# Patient Record
Sex: Female | Born: 1935 | Race: White | Hispanic: No | Smoking: Former smoker
Health system: Southern US, Community
[De-identification: ages and names within clinical notes are randomized; demographics above are authoritative.]

## PROBLEM LIST (undated history)

## (undated) DIAGNOSIS — R011 Cardiac murmur, unspecified: Secondary | ICD-10-CM

## (undated) DIAGNOSIS — K921 Melena: Secondary | ICD-10-CM

## (undated) DIAGNOSIS — N189 Chronic kidney disease, unspecified: Secondary | ICD-10-CM

## (undated) DIAGNOSIS — I00 Rheumatic fever without heart involvement: Secondary | ICD-10-CM

## (undated) DIAGNOSIS — I10 Essential (primary) hypertension: Secondary | ICD-10-CM

## (undated) DIAGNOSIS — Z8619 Personal history of other infectious and parasitic diseases: Secondary | ICD-10-CM

## (undated) DIAGNOSIS — R58 Hemorrhage, not elsewhere classified: Secondary | ICD-10-CM

## (undated) DIAGNOSIS — I639 Cerebral infarction, unspecified: Secondary | ICD-10-CM

## (undated) DIAGNOSIS — R569 Unspecified convulsions: Secondary | ICD-10-CM

## (undated) DIAGNOSIS — Z87440 Personal history of urinary (tract) infections: Secondary | ICD-10-CM

## (undated) DIAGNOSIS — Z8719 Personal history of other diseases of the digestive system: Secondary | ICD-10-CM

## (undated) DIAGNOSIS — G459 Transient cerebral ischemic attack, unspecified: Secondary | ICD-10-CM

## (undated) DIAGNOSIS — F329 Major depressive disorder, single episode, unspecified: Secondary | ICD-10-CM

## (undated) DIAGNOSIS — E785 Hyperlipidemia, unspecified: Secondary | ICD-10-CM

## (undated) DIAGNOSIS — Z8711 Personal history of peptic ulcer disease: Secondary | ICD-10-CM

## (undated) DIAGNOSIS — F32A Depression, unspecified: Secondary | ICD-10-CM

## (undated) DIAGNOSIS — R413 Other amnesia: Secondary | ICD-10-CM

## (undated) DIAGNOSIS — R32 Unspecified urinary incontinence: Secondary | ICD-10-CM

## (undated) DIAGNOSIS — H409 Unspecified glaucoma: Secondary | ICD-10-CM

## (undated) DIAGNOSIS — T7840XA Allergy, unspecified, initial encounter: Secondary | ICD-10-CM

## (undated) DIAGNOSIS — M1990 Unspecified osteoarthritis, unspecified site: Secondary | ICD-10-CM

## (undated) HISTORY — DX: Major depressive disorder, single episode, unspecified: F32.9

## (undated) HISTORY — PX: ABDOMINAL HYSTERECTOMY: SHX81

## (undated) HISTORY — DX: Unspecified urinary incontinence: R32

## (undated) HISTORY — DX: Hyperlipidemia, unspecified: E78.5

## (undated) HISTORY — DX: Melena: K92.1

## (undated) HISTORY — DX: Cerebral infarction, unspecified: I63.9

## (undated) HISTORY — DX: Hemorrhage, not elsewhere classified: R58

## (undated) HISTORY — PX: TONSILLECTOMY: SUR1361

## (undated) HISTORY — DX: Personal history of other diseases of the digestive system: Z87.19

## (undated) HISTORY — DX: Allergy, unspecified, initial encounter: T78.40XA

## (undated) HISTORY — DX: Unspecified convulsions: R56.9

## (undated) HISTORY — PX: OTHER SURGICAL HISTORY: SHX169

## (undated) HISTORY — DX: Personal history of peptic ulcer disease: Z87.11

## (undated) HISTORY — DX: Personal history of other infectious and parasitic diseases: Z86.19

## (undated) HISTORY — DX: Personal history of urinary (tract) infections: Z87.440

## (undated) HISTORY — DX: Rheumatic fever without heart involvement: I00

## (undated) HISTORY — DX: Unspecified osteoarthritis, unspecified site: M19.90

## (undated) HISTORY — DX: Other amnesia: R41.3

## (undated) HISTORY — DX: Chronic kidney disease, unspecified: N18.9

## (undated) HISTORY — DX: Essential (primary) hypertension: I10

## (undated) HISTORY — DX: Unspecified glaucoma: H40.9

## (undated) HISTORY — DX: Depression, unspecified: F32.A

## (undated) HISTORY — DX: Cardiac murmur, unspecified: R01.1

## (undated) HISTORY — DX: Transient cerebral ischemic attack, unspecified: G45.9

---

## 1981-12-28 HISTORY — PX: APPENDECTOMY: SHX54

## 2004-04-11 ENCOUNTER — Encounter: Admission: RE | Admit: 2004-04-11 | Discharge: 2004-04-11 | Payer: Self-pay | Admitting: Neurosurgery

## 2004-04-25 ENCOUNTER — Encounter: Admission: RE | Admit: 2004-04-25 | Discharge: 2004-04-25 | Payer: Self-pay | Admitting: Neurosurgery

## 2004-05-14 ENCOUNTER — Encounter: Admission: RE | Admit: 2004-05-14 | Discharge: 2004-05-14 | Payer: Self-pay | Admitting: Neurosurgery

## 2004-07-09 ENCOUNTER — Encounter: Admission: RE | Admit: 2004-07-09 | Discharge: 2004-07-09 | Payer: Self-pay | Admitting: Neurosurgery

## 2004-10-20 ENCOUNTER — Encounter: Admission: RE | Admit: 2004-10-20 | Discharge: 2004-10-20 | Payer: Self-pay | Admitting: Neurosurgery

## 2004-11-03 ENCOUNTER — Encounter: Admission: RE | Admit: 2004-11-03 | Discharge: 2004-11-03 | Payer: Self-pay | Admitting: Neurosurgery

## 2005-12-20 ENCOUNTER — Emergency Department (HOSPITAL_COMMUNITY): Admission: EM | Admit: 2005-12-20 | Discharge: 2005-12-20 | Payer: Self-pay | Admitting: Emergency Medicine

## 2007-07-12 ENCOUNTER — Encounter: Admission: RE | Admit: 2007-07-12 | Discharge: 2007-07-12 | Payer: Self-pay | Admitting: Family Medicine

## 2007-10-18 ENCOUNTER — Encounter: Admission: RE | Admit: 2007-10-18 | Discharge: 2007-10-18 | Payer: Self-pay | Admitting: Family Medicine

## 2008-07-16 ENCOUNTER — Encounter: Admission: RE | Admit: 2008-07-16 | Discharge: 2008-07-16 | Payer: Self-pay | Admitting: Family Medicine

## 2009-07-17 ENCOUNTER — Encounter: Admission: RE | Admit: 2009-07-17 | Discharge: 2009-07-17 | Payer: Self-pay | Admitting: Family Medicine

## 2010-07-22 ENCOUNTER — Encounter
Admission: RE | Admit: 2010-07-22 | Discharge: 2010-07-22 | Payer: Self-pay | Source: Home / Self Care | Admitting: Family Medicine

## 2010-12-26 ENCOUNTER — Encounter
Admission: RE | Admit: 2010-12-26 | Discharge: 2010-12-26 | Payer: Self-pay | Source: Home / Self Care | Attending: Family Medicine | Admitting: Family Medicine

## 2011-04-26 ENCOUNTER — Emergency Department (HOSPITAL_COMMUNITY): Payer: Medicare Other

## 2011-04-26 ENCOUNTER — Emergency Department (HOSPITAL_COMMUNITY)
Admission: EM | Admit: 2011-04-26 | Discharge: 2011-04-26 | Disposition: A | Discharge status: Home / Self Care | Payer: Medicare Other | Attending: Emergency Medicine | Admitting: Emergency Medicine

## 2011-04-26 DIAGNOSIS — I10 Essential (primary) hypertension: Secondary | ICD-10-CM | POA: Insufficient documentation

## 2011-04-26 DIAGNOSIS — R209 Unspecified disturbances of skin sensation: Secondary | ICD-10-CM | POA: Insufficient documentation

## 2011-04-26 DIAGNOSIS — K219 Gastro-esophageal reflux disease without esophagitis: Secondary | ICD-10-CM | POA: Insufficient documentation

## 2011-04-26 DIAGNOSIS — E7800 Pure hypercholesterolemia, unspecified: Secondary | ICD-10-CM | POA: Insufficient documentation

## 2011-04-26 DIAGNOSIS — H53469 Homonymous bilateral field defects, unspecified side: Secondary | ICD-10-CM | POA: Insufficient documentation

## 2011-04-26 DIAGNOSIS — R29898 Other symptoms and signs involving the musculoskeletal system: Secondary | ICD-10-CM | POA: Insufficient documentation

## 2011-04-26 DIAGNOSIS — Z8679 Personal history of other diseases of the circulatory system: Secondary | ICD-10-CM | POA: Insufficient documentation

## 2011-04-26 DIAGNOSIS — H5460 Unqualified visual loss, one eye, unspecified: Secondary | ICD-10-CM | POA: Insufficient documentation

## 2011-04-26 DIAGNOSIS — H409 Unspecified glaucoma: Secondary | ICD-10-CM | POA: Insufficient documentation

## 2011-04-26 LAB — POCT I-STAT, CHEM 8
BUN: 20 mg/dL (ref 6–23)
Calcium, Ion: 1.15 mmol/L (ref 1.12–1.32)
Chloride: 104 mEq/L (ref 96–112)
Creatinine, Ser: 1.3 mg/dL — ABNORMAL HIGH (ref 0.4–1.2)
Glucose, Bld: 89 mg/dL (ref 70–99)
HCT: 39 % (ref 36.0–46.0)
Hemoglobin: 13.3 g/dL (ref 12.0–15.0)
Potassium: 3.6 mEq/L (ref 3.5–5.1)
Sodium: 140 mEq/L (ref 135–145)
TCO2: 26 mmol/L (ref 0–100)

## 2011-05-08 ENCOUNTER — Other Ambulatory Visit: Payer: Self-pay | Admitting: Neurology

## 2011-05-08 DIAGNOSIS — I63239 Cerebral infarction due to unspecified occlusion or stenosis of unspecified carotid arteries: Secondary | ICD-10-CM

## 2011-07-16 ENCOUNTER — Other Ambulatory Visit: Payer: Self-pay | Admitting: Family Medicine

## 2011-07-16 DIAGNOSIS — Z1231 Encounter for screening mammogram for malignant neoplasm of breast: Secondary | ICD-10-CM

## 2011-08-04 ENCOUNTER — Ambulatory Visit
Admission: RE | Admit: 2011-08-04 | Discharge: 2011-08-04 | Disposition: A | Discharge status: Home / Self Care | Payer: Medicare Other | Source: Ambulatory Visit | Attending: Family Medicine | Admitting: Family Medicine

## 2011-08-04 DIAGNOSIS — Z1231 Encounter for screening mammogram for malignant neoplasm of breast: Secondary | ICD-10-CM

## 2011-11-03 ENCOUNTER — Encounter: Payer: Self-pay | Admitting: Vascular Surgery

## 2011-11-04 ENCOUNTER — Encounter: Payer: Self-pay | Admitting: Vascular Surgery

## 2011-11-05 ENCOUNTER — Encounter: Payer: Self-pay | Admitting: Vascular Surgery

## 2011-11-05 ENCOUNTER — Ambulatory Visit (INDEPENDENT_AMBULATORY_CARE_PROVIDER_SITE_OTHER): Payer: Medicare Other | Admitting: Vascular Surgery

## 2011-11-05 VITALS — BP 145/81 | HR 94 | Resp 18 | Ht 63.0 in | Wt 175.0 lb

## 2011-11-05 DIAGNOSIS — I863 Vulval varices: Secondary | ICD-10-CM

## 2011-11-05 NOTE — Progress Notes (Signed)
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 This does happen on 3 different occasions. Initially. She presented to her primary care physician where she had a topical thrombotic agent applied. He does not have any history of DVT or varicose veins. She did have treatment for spider veins telangiectasia on her legs several years ago at an outlying vein center. She denies any lower extremity swelling.  Past Medical History  Diagnosis Date  . Bleeding of blood vessel     ext. genitalia area  . Stroke   . Hypertension   . Hyperlipidemia     History  Substance Use Topics  . Smoking status: Former Smoker -- 40 years    Types: Cigarettes    Quit date: 11/05/1971  . Smokeless tobacco: Never Used  . Alcohol Use: No    Family History  Problem Relation Age of Onset  . Cancer Mother   . Heart disease Father   . Cancer Sister     Allergies  Allergen Reactions  . Tetanus Toxoids     Current outpatient prescriptions:acetaminophen (TYLENOL) 325 MG tablet, Take 650 mg by mouth every 6 (six) hours as needed.  , Disp: , Rfl: ;  clonazePAM (KLONOPIN) 0.5 MG tablet, Take 0.5 mg by mouth 2 (two) times daily as needed.  , Disp: , Rfl: ;  clopidogrel (PLAVIX) 75 MG tablet, Take 75 mg by mouth daily.  , Disp: , Rfl: ;  enalapril (VASOTEC) 20 MG tablet, Take 20 mg by mouth daily.  , Disp: , Rfl:  famciclovir (FAMVIR) 500 MG tablet, Take 500 mg by mouth 3 (three) times daily.  , Disp: , Rfl: ;  famotidine (PEPCID) 20 MG tablet, Take 20 mg by mouth 2 (two) times daily.  , Disp: , Rfl: ;  furosemide (LASIX) 80 MG tablet, Take 80 mg by mouth daily.  , Disp: , Rfl: ;  pravastatin (PRAVACHOL) 40 MG tablet, Take 40 mg by mouth daily.  , Disp: , Rfl:  traMADol (ULTRAM) 50 MG tablet, Take 50 mg by mouth 2 (two) times daily. Maximum dose= 8 tablets per day , Disp: , Rfl: ;  Vitamin D, Ergocalciferol, (DRISDOL) 50000 UNITS CAPS, Take 50,000 Units by  mouth.  , Disp: , Rfl: ;  LORazepam (ATIVAN) 0.5 MG tablet, Take 0.5 mg by mouth every 8 (eight) hours.  , Disp: , Rfl:   BP 145/81  Pulse 94  Resp 18  Ht 5\' 3"  (1.6 m)  Wt 175 lb (79.379 kg)  BMI 31.00 kg/m2  Body mass index is 31.00 kg/(m^2).       Review of systems positive for stroke in the past otherwise negative except for past medical history.  Physical exam: Well-developed well-nourished white female no acute distress. HEENT normal. 2+ radial and dorsalis pedis pulses bilaterally. She does not have any lower extremity swelling. She has a few scattered telangiectasia over both lower extremity spelled no evidence of venous hypertension. Extremities without deformities or cyanosis. Neurologically she is grossly intact. Regarding her labia she does have all trouble diffuse small telangiectasia. She does not have any evidence of labial varicosities.  Impression and plan. I discussed the significance of this with Ms. Jonna Clark. I do not see any evidence of pelvic engorgement or other causes for this. I explained that I have not seen this before but should be able to stop bleeding from the telangiectasia with sclerotherapy. She has multiple areas in That she may develop more  of these in the future and would that point be at risk for future bleeding as well. He will consider treatment with sclerotherapy notify us if she wished to proceed she understands and began our office as an outpatient.

## 2012-05-09 ENCOUNTER — Other Ambulatory Visit: Payer: Self-pay | Admitting: Family Medicine

## 2012-05-09 DIAGNOSIS — M545 Low back pain, unspecified: Secondary | ICD-10-CM

## 2012-05-10 ENCOUNTER — Other Ambulatory Visit: Payer: Medicare Other

## 2012-07-05 ENCOUNTER — Other Ambulatory Visit: Payer: Self-pay | Admitting: Family Medicine

## 2012-07-05 DIAGNOSIS — Z1231 Encounter for screening mammogram for malignant neoplasm of breast: Secondary | ICD-10-CM

## 2012-08-08 ENCOUNTER — Ambulatory Visit
Admission: RE | Admit: 2012-08-08 | Discharge: 2012-08-08 | Disposition: A | Discharge status: Home / Self Care | Payer: Medicare Other | Source: Ambulatory Visit | Attending: Family Medicine | Admitting: Family Medicine

## 2012-08-08 DIAGNOSIS — Z1231 Encounter for screening mammogram for malignant neoplasm of breast: Secondary | ICD-10-CM

## 2013-07-31 ENCOUNTER — Other Ambulatory Visit: Payer: Self-pay

## 2013-07-31 DIAGNOSIS — Z1231 Encounter for screening mammogram for malignant neoplasm of breast: Secondary | ICD-10-CM

## 2013-08-16 ENCOUNTER — Ambulatory Visit: Payer: Medicare Other

## 2013-09-06 ENCOUNTER — Ambulatory Visit
Admission: RE | Admit: 2013-09-06 | Discharge: 2013-09-06 | Disposition: A | Discharge status: Home / Self Care | Payer: Medicare Other | Source: Ambulatory Visit

## 2013-09-06 DIAGNOSIS — Z1231 Encounter for screening mammogram for malignant neoplasm of breast: Secondary | ICD-10-CM

## 2013-10-22 ENCOUNTER — Observation Stay (HOSPITAL_COMMUNITY)
Admission: EM | Admit: 2013-10-22 | Discharge: 2013-10-23 | Disposition: A | Discharge status: Home / Self Care | Payer: Medicare Other | Attending: Internal Medicine | Admitting: Internal Medicine

## 2013-10-22 ENCOUNTER — Encounter (HOSPITAL_COMMUNITY): Payer: Self-pay | Admitting: Emergency Medicine

## 2013-10-22 ENCOUNTER — Emergency Department (HOSPITAL_COMMUNITY): Payer: Medicare Other

## 2013-10-22 ENCOUNTER — Observation Stay (HOSPITAL_COMMUNITY): Payer: Medicare Other

## 2013-10-22 DIAGNOSIS — Z888 Allergy status to other drugs, medicaments and biological substances status: Secondary | ICD-10-CM | POA: Diagnosis not present

## 2013-10-22 DIAGNOSIS — R58 Hemorrhage, not elsewhere classified: Secondary | ICD-10-CM | POA: Diagnosis not present

## 2013-10-22 DIAGNOSIS — G459 Transient cerebral ischemic attack, unspecified: Secondary | ICD-10-CM | POA: Diagnosis not present

## 2013-10-22 DIAGNOSIS — E785 Hyperlipidemia, unspecified: Secondary | ICD-10-CM | POA: Diagnosis not present

## 2013-10-22 DIAGNOSIS — Z79899 Other long term (current) drug therapy: Secondary | ICD-10-CM | POA: Diagnosis not present

## 2013-10-22 DIAGNOSIS — I10 Essential (primary) hypertension: Secondary | ICD-10-CM | POA: Diagnosis present

## 2013-10-22 DIAGNOSIS — Z87891 Personal history of nicotine dependence: Secondary | ICD-10-CM | POA: Diagnosis not present

## 2013-10-22 DIAGNOSIS — R4182 Altered mental status, unspecified: Secondary | ICD-10-CM | POA: Diagnosis present

## 2013-10-22 HISTORY — DX: Transient cerebral ischemic attack, unspecified: G45.9

## 2013-10-22 LAB — URINALYSIS, ROUTINE W REFLEX MICROSCOPIC
Bilirubin Urine: NEGATIVE
Glucose, UA: NEGATIVE mg/dL
Hgb urine dipstick: NEGATIVE
Protein, ur: NEGATIVE mg/dL
Specific Gravity, Urine: 1.016 (ref 1.005–1.030)
Urobilinogen, UA: 0.2 mg/dL (ref 0.0–1.0)
pH: 7 (ref 5.0–8.0)

## 2013-10-22 LAB — DIFFERENTIAL
Basophils Absolute: 0 10*3/uL (ref 0.0–0.1)
Basophils Relative: 0 % (ref 0–1)
Eosinophils Absolute: 0.1 10*3/uL (ref 0.0–0.7)
Eosinophils Relative: 2 % (ref 0–5)
Monocytes Relative: 11 % (ref 3–12)
Neutrophils Relative %: 54 % (ref 43–77)

## 2013-10-22 LAB — COMPREHENSIVE METABOLIC PANEL
ALT: 18 U/L (ref 0–35)
AST: 24 U/L (ref 0–37)
Albumin: 4.3 g/dL (ref 3.5–5.2)
Alkaline Phosphatase: 91 U/L (ref 39–117)
BUN: 12 mg/dL (ref 6–23)
Creatinine, Ser: 0.99 mg/dL (ref 0.50–1.10)
Glucose, Bld: 98 mg/dL (ref 70–99)
Potassium: 3.2 mEq/L — ABNORMAL LOW (ref 3.5–5.1)
Sodium: 142 mEq/L (ref 135–145)
Total Protein: 7.3 g/dL (ref 6.0–8.3)

## 2013-10-22 LAB — URINE MICROSCOPIC-ADD ON

## 2013-10-22 LAB — PROTIME-INR
INR: 1 (ref 0.00–1.49)
Prothrombin Time: 13 seconds (ref 11.6–15.2)

## 2013-10-22 LAB — CBC
Hemoglobin: 14.7 g/dL (ref 12.0–15.0)
MCH: 31.7 pg (ref 26.0–34.0)
MCHC: 35 g/dL (ref 30.0–36.0)
MCV: 90.5 fL (ref 78.0–100.0)
Platelets: 163 10*3/uL (ref 150–400)

## 2013-10-22 LAB — TROPONIN I: Troponin I: <0.3 ng/mL (ref <0.30)

## 2013-10-22 LAB — APTT: aPTT: 32 seconds (ref 24–37)

## 2013-10-22 MED ORDER — CLOPIDOGREL BISULFATE 75 MG PO TABS
37.5000 mg | ORAL_TABLET | ORAL | Status: DC
  Administered 2013-10-23: 37.5 mg via ORAL
  Filled 2013-10-22: qty 1

## 2013-10-22 MED ORDER — PSYLLIUM 95 % PO PACK
1.0000 | PACK | Freq: Every day | ORAL | Status: DC
  Administered 2013-10-22: 22:00:00 via ORAL
  Filled 2013-10-22 (×2): qty 1

## 2013-10-22 MED ORDER — FAMOTIDINE 20 MG PO TABS
20.0000 mg | ORAL_TABLET | Freq: Two times a day (BID) | ORAL | Status: DC
  Administered 2013-10-22 – 2013-10-23 (×2): 20 mg via ORAL
  Filled 2013-10-22 (×3): qty 1

## 2013-10-22 MED ORDER — SIMVASTATIN 20 MG PO TABS
20.0000 mg | ORAL_TABLET | Freq: Every day | ORAL | Status: DC
  Administered 2013-10-22: 20 mg via ORAL
  Filled 2013-10-22 (×2): qty 1

## 2013-10-22 MED ORDER — SODIUM CHLORIDE 0.9 % IV SOLN
INTRAVENOUS | Status: DC
  Administered 2013-10-22 – 2013-10-23 (×2): via INTRAVENOUS

## 2013-10-22 MED ORDER — ENOXAPARIN SODIUM 40 MG/0.4ML ~~LOC~~ SOLN
40.0000 mg | SUBCUTANEOUS | Status: DC
  Administered 2013-10-22: 40 mg via SUBCUTANEOUS
  Filled 2013-10-22 (×2): qty 0.4

## 2013-10-22 MED ORDER — PREDNISOLONE ACETATE 1 % OP SUSP
1.0000 [drp] | Freq: Two times a day (BID) | OPHTHALMIC | Status: DC
  Administered 2013-10-22 – 2013-10-23 (×2): 1 [drp] via OPHTHALMIC
  Filled 2013-10-22: qty 1

## 2013-10-22 MED ORDER — CLONAZEPAM 0.5 MG PO TABS: 0.2500 mg | ORAL_TABLET | Freq: Every day | ORAL | Status: DC | PRN

## 2013-10-22 MED ORDER — DORZOLAMIDE HCL-TIMOLOL MAL 2-0.5 % OP SOLN
1.0000 [drp] | Freq: Every day | OPHTHALMIC | Status: DC
  Administered 2013-10-23: 1 [drp] via OPHTHALMIC
  Filled 2013-10-22: qty 10

## 2013-10-22 MED ORDER — TRAMADOL HCL 50 MG PO TABS
50.0000 mg | ORAL_TABLET | Freq: Two times a day (BID) | ORAL | Status: DC | PRN
  Administered 2013-10-23: 50 mg via ORAL
  Filled 2013-10-22: qty 1

## 2013-10-22 MED ORDER — POTASSIUM CHLORIDE CRYS ER 20 MEQ PO TBCR
40.0000 meq | EXTENDED_RELEASE_TABLET | Freq: Once | ORAL | Status: AC
  Administered 2013-10-22: 40 meq via ORAL
  Filled 2013-10-22: qty 2

## 2013-10-22 MED ORDER — AMLODIPINE BESYLATE 5 MG PO TABS
5.0000 mg | ORAL_TABLET | Freq: Every day | ORAL | Status: DC
  Administered 2013-10-22 – 2013-10-23 (×2): 5 mg via ORAL
  Filled 2013-10-22 (×2): qty 1

## 2013-10-22 MED ORDER — ACETAMINOPHEN 325 MG PO TABS: 650.0000 mg | ORAL_TABLET | ORAL | Status: DC | PRN

## 2013-10-22 MED ORDER — POLYVINYL ALCOHOL 1.4 % OP SOLN
1.0000 [drp] | OPHTHALMIC | Status: DC | PRN
  Administered 2013-10-22: 1 [drp] via OPHTHALMIC
  Filled 2013-10-22: qty 15

## 2013-10-22 NOTE — ED Notes (Signed)
Pt still in MRI at this time

## 2013-10-22 NOTE — ED Notes (Signed)
Pt returned from MRI °

## 2013-10-22 NOTE — ED Notes (Signed)
Pt undressed, in gown, on monitor, continuous pulse oximetry and blood pressure cuff; vitals and EKG being performed; family at bedside

## 2013-10-22 NOTE — ED Notes (Signed)
Neurology at bedside.

## 2013-10-22 NOTE — ED Notes (Signed)
Called Floor for report stated will call back.

## 2013-10-22 NOTE — H&P (Signed)
History and Physical       Hospital Admission Note Date: 10/22/2013  Patient name: Cheryl Wood Medical record number: [id number] Date of birth: 04-Jan-1936 Age: 77 y.o. Gender: female PCP: Kaleen Mask, MD    Chief Complaint:  Sudden altered mental status  HPI: Patient is a 77 year old with a history of stroke in 2006, hypertension, hyperlipidemia presented with confusion and sudden altered mental status today. Patient reports that she had a history of stroke in the past and has been seen by Dr. Pearlean Brownie, she takes Plavix 37.5 mg every other day as before dose gives her headache.  The patient was in the church today, sang in the choir and around 11:20am was noticed to be confused. Patient did not know where she was, patient was brought to the ER by the EMS. Per the family member (daughter) in the room, she did not have any seizure-like activity or a syncopal episode. CT scan was done which showed no acute stroke.   Patient's daughter reported that her episode was similar to the TIA she had experienced 2 years ago. In the ER patient's BP was also elevated at 183/108 (at the time of triage 212/96), labs were unremarkable except potassium of 3.2.  Neurology was consulted and recommended TIA workup and rule out seizure.  Review of Systems:  Constitutional: Denies fever, chills, diaphoresis, poor appetite and fatigue.  HEENT: Denies photophobia, eye pain, redness, hearing loss, ear pain, congestion, sore throat, rhinorrhea, sneezing, mouth sores, trouble swallowing, neck pain, neck stiffness and tinnitus.   Respiratory: Denies SOB, DOE, cough, chest tightness,  and wheezing.   Cardiovascular: Denies chest pain, palpitations and leg swelling.  Gastrointestinal: Denies nausea, vomiting, abdominal pain, diarrhea, constipation, blood in stool and abdominal distention.  Genitourinary: Denies dysuria, urgency, frequency, hematuria, flank pain  and difficulty urinating.  Musculoskeletal: Denies myalgias, back pain, joint swelling, arthralgias and gait problem.  Skin: Denies pallor, rash and wound.  Neurological: Please see history of present illness Hematological: Denies adenopathy. Easy bruising, personal or family bleeding history  Psychiatric/Behavioral: Denies suicidal ideation, mood changes, confusion, nervousness, sleep disturbance and agitation  Past Medical History: Past Medical History  Diagnosis Date  . Bleeding of blood vessel     ext. genitalia area  . Stroke   . Hypertension   . Hyperlipidemia    Past Surgical History  Procedure Laterality Date  . Abdominal hysterectomy    . Gum transplant      Medications: Prior to Admission medications   Medication Sig Start Date End Date Taking? Authorizing Provider  clonazePAM (KLONOPIN) 0.5 MG tablet Take 0.25 mg by mouth daily as needed (sleep).    Yes Historical Provider, MD  clopidogrel (PLAVIX) 75 MG tablet Take 37.5 mg by mouth every other day.    Yes Historical Provider, MD  Dorzolamide HCl-Timolol Mal (COSOPT OP) Place 1 drop into the left eye daily. Cosopt-Plus 0.5%   Yes Historical Provider, MD  famciclovir (FAMVIR) 500 MG tablet Take 500 mg by mouth 3 (three) times daily.     Yes Historical Provider, MD  famotidine (PEPCID) 20 MG tablet Take 20 mg by mouth 2 (two) times daily.    Yes Historical Provider, MD  furosemide (LASIX) 80 MG tablet Take 80 mg by mouth daily.     Yes Historical Provider, MD  Polyethyl Glycol-Propyl Glycol (SYSTANE OP) Place 1 drop into the right eye 2 (two) times daily.   Yes Historical Provider, MD  pravastatin (PRAVACHOL) 40 MG tablet Take 40 mg by  mouth every evening.    Yes Historical Provider, MD  prednisoLONE acetate (PRED FORTE) 1 % ophthalmic suspension Place 1 drop into the left eye 2 (two) times daily.   Yes Historical Provider, MD  psyllium (HYDROCIL/METAMUCIL) 95 % PACK Take 1 packet by mouth at bedtime.   Yes Historical  Provider, MD  traMADol (ULTRAM) 50 MG tablet Take 50 mg by mouth 2 (two) times daily as needed for pain. Maximum dose= 8 tablets per day   Yes Historical Provider, MD  Vitamin D, Ergocalciferol, (DRISDOL) 50000 UNITS CAPS Take 50,000 Units by mouth every 14 (fourteen) days.    Yes Historical Provider, MD    Allergies:   Allergies  Allergen Reactions  . Tetanus Toxoids     Social History:  reports that she quit smoking about 41 years ago. Her smoking use included Cigarettes. She smoked 0.00 packs per day for 40 years. She has never used smokeless tobacco. She reports that she does not drink alcohol or use illicit drugs.  Family History: Family History  Problem Relation Age of Onset  . Cancer Mother   . Heart disease Father   . Cancer Sister     Physical Exam: Blood pressure 138/75, pulse 63, temperature 98 F (36.7 C), temperature source Oral, resp. rate 14, SpO2 96.00%. General: Alert, awake, oriented x3, in no acute distress. HEENT: normocephalic, atraumatic, anicteric sclera, pink conjunctiva, pupils equal and reactive to light and accomodation, oropharynx clear Neck: supple, no masses or lymphadenopathy, no goiter, no bruits  Heart: Regular rate and rhythm, without murmurs, rubs or gallops. Lungs: Clear to auscultation bilaterally, no wheezing, rales or rhonchi. Abdomen: Soft, nontender, nondistended, positive bowel sounds, no masses. Extremities: No clubbing, cyanosis or edema with positive pedal pulses. Neuro: Grossly intact, no focal neurological deficits, strength 5/5 upper and lower extremities bilaterally Psych: alert and oriented x 3, normal mood and affect Skin: no rashes or lesions, warm and dry   LABS on Admission:  Basic Metabolic Panel:  Recent Labs Lab 10/22/13 1300  NA 142  K 3.2*  CL 103  CO2 28  GLUCOSE 98  BUN 12  CREATININE 0.99  CALCIUM 9.4   Liver Function Tests:  Recent Labs Lab 10/22/13 1300  AST 24  ALT 18  ALKPHOS 91  BILITOT 0.9   PROT 7.3  ALBUMIN 4.3   No results found for this basename: LIPASE, AMYLASE,  in the last 168 hours No results found for this basename: AMMONIA,  in the last 168 hours CBC:  Recent Labs Lab 10/22/13 1300  WBC 5.0  NEUTROABS 2.7  HGB 14.7  HCT 42.0  MCV 90.5  PLT 163   Cardiac Enzymes:  Recent Labs Lab 10/22/13 1300  TROPONINI <0.30   BNP: No components found with this basename: POCBNP,  CBG: No results found for this basename: GLUCAP,  in the last 168 hours   Radiological Exams on Admission: Ct Head Wo Contrast  10/22/2013   CLINICAL DATA:  Altered mental status/ short-term memory loss  EXAM: CT HEAD WITHOUT CONTRAST  TECHNIQUE: Contiguous axial images were obtained from the base of the skull through the vertex without intravenous contrast. Study was obtained within 24 hr of patient's arrival at the emergency department.  COMPARISON:  April 26, 2011  FINDINGS: There is mild diffuse atrophy. There is no mass, hemorrhage, extra-axial fluid collection, or midline shift.  There is evidence of a prior small infarct in the inferior mid left centrum semiovale. There is patchy small vessel disease in  the centra semiovale bilaterally. There is no new gray-white compartment lesion. No acute infarct apparent.  Bony calvarium appears intact. Mastoid air cells are clear. There is mild inferior maxillary sinus disease bilaterally.  IMPRESSION: Mild atrophy with mild small vessel disease. Prior small infarct inferior mid left centrum semiovale. No intracranial mass, hemorrhage, or acute appearing infarct. There is mild maxillary sinus disease bilaterally.   Electronically Signed   By: Bretta Bang M.D.   On: 10/22/2013 13:42   EKG showed rate 70, normal sinus rhythm  Assessment/Plan Principal Problem:   TIA (transient ischemic attack): - Admit for TIA workup, obtain MRI/MRA brain, 2-D echo, carotid Dopplers for further workup - Will also check EEG to rule out any seizures - Obtain  hemoglobin A1c, lipid panel, BP control,  - check B12, folate, TSH, RPR (patient also having some short-term memory issues) - Swallow testing, neurochecks every 4 hours, telemetry, r/o UTI  Active Problems:   Malignant hypertension:  - Will start on Norvasc 5 mg daily and PRN hydralazine for now    Hyperlipidemia - Check lipid panel, continue statin   Hypokalemia: Replaced  DVT prophylaxis: lovenox  CODE STATUS: Full Code  Family Communication: Admission, patients condition and plan of care including tests being ordered have been discussed with the patient and daughter who indicates understanding and agree with the plan and Code Status   Further plan will depend as patient's clinical course evolves and further radiologic and laboratory data become available.   Time Spent on Admission: 1 hour  Gaylene Moylan M.D. [facility] 10/22/2013, 4:33 PM Pager: [phone]  If 7PM-7AM, please contact night-coverage [email or web address] Password [email or web address]

## 2013-10-22 NOTE — ED Notes (Signed)
Pt has returned from being out of the department; pt placed back on monitor, continuous pulse oximetry and blood pressure cuff; family at bedside 

## 2013-10-22 NOTE — ED Notes (Signed)
EMS gave Nurse watch who gave to daughter at bedside.

## 2013-10-22 NOTE — ED Provider Notes (Signed)
CSN: [id number]     Arrival date & time 10/22/13  1224 History   First MD Initiated Contact with Patient 10/22/13 1230     Chief Complaint  Patient presents with  . Altered Mental Status    HPI Patient went to church today last known well 1100 and at 1128 patient told someone "I don't know where I'm at" EMS was called stated patient confused did not know what month it is with some short term memory loss. Alert answering some questions correctly and follows commands appropriate.   Past Medical History  Diagnosis Date  . Bleeding of blood vessel     ext. genitalia area  . Stroke   . Hypertension   . Hyperlipidemia    Past Surgical History  Procedure Laterality Date  . Abdominal hysterectomy    . Gum transplant     Family History  Problem Relation Age of Onset  . Cancer Mother   . Heart disease Father   . Cancer Sister    History  Substance Use Topics  . Smoking status: Former Smoker -- 40 years    Types: Cigarettes    Quit date: 11/05/1971  . Smokeless tobacco: Never Used  . Alcohol Use: No   OB History   Grav Para Term Preterm Abortions TAB SAB Ect Mult Living                 Review of Systems  Unable to perform ROS: Acuity of condition    Allergies  Tetanus toxoids  Home Medications   Current Outpatient Rx  Name  Route  Sig  Dispense  Refill  . clonazePAM (KLONOPIN) 0.5 MG tablet   Oral   Take 0.25 mg by mouth daily as needed (sleep).          . clopidogrel (PLAVIX) 75 MG tablet   Oral   Take 37.5 mg by mouth every other day.          . Dorzolamide HCl-Timolol Mal (COSOPT OP)   Left Eye   Place 1 drop into the left eye daily. Cosopt-Plus 0.5%         . famciclovir (FAMVIR) 500 MG tablet   Oral   Take 500 mg by mouth 3 (three) times daily.           . famotidine (PEPCID) 20 MG tablet   Oral   Take 20 mg by mouth 2 (two) times daily.          . furosemide (LASIX) 80 MG tablet   Oral   Take 80 mg by mouth daily.           Bertram Gala Glycol-Propyl Glycol (SYSTANE OP)   Right Eye   Place 1 drop into the right eye 2 (two) times daily.         . pravastatin (PRAVACHOL) 40 MG tablet   Oral   Take 40 mg by mouth every evening.          . prednisoLONE acetate (PRED FORTE) 1 % ophthalmic suspension   Left Eye   Place 1 drop into the left eye 2 (two) times daily.         . psyllium (HYDROCIL/METAMUCIL) 95 % PACK   Oral   Take 1 packet by mouth at bedtime.         . traMADol (ULTRAM) 50 MG tablet   Oral   Take 50 mg by mouth 2 (two) times daily as needed for pain. Maximum dose= 8 tablets per  day         . Vitamin D, Ergocalciferol, (DRISDOL) 50000 UNITS CAPS   Oral   Take 50,000 Units by mouth every 14 (fourteen) days.           BP 170/84  Pulse 68  Temp(Src) 98.4 F (36.9 C) (Oral)  Resp 15  SpO2 97% Physical Exam  Nursing note and vitals reviewed. Constitutional: She is oriented to person, place, and time. She appears well-developed and well-nourished. No distress.  HENT:  Head: Normocephalic and atraumatic.  Eyes: Pupils are equal, round, and reactive to light.  Neck: Normal range of motion.  Cardiovascular: Normal rate and intact distal pulses.   Pulmonary/Chest: No respiratory distress.  Abdominal: Normal appearance. She exhibits no distension.  Musculoskeletal: Normal range of motion.  Neurological: She is alert and oriented to person, place, and time. She has normal strength. No cranial nerve deficit or sensory deficit. Coordination normal. GCS eye subscore is 4. GCS verbal subscore is 5. GCS motor subscore is 6.  Skin: Skin is warm and dry. No rash noted.  Psychiatric: She has a normal mood and affect. Her behavior is normal.    ED Course  Procedures (including critical care time)  CRITICAL CARE Performed by: Nelva Nay L Total critical care time: 30 min Critical care time was exclusive of separately billable procedures and treating other patients. Critical care was  necessary to treat or prevent imminent or life-threatening deterioration. Critical care was time spent personally by me on the following activities: development of treatment plan with patient and/or surrogate as well as nursing, discussions with consultants, evaluation of patient's response to treatment, examination of patient, obtaining history from patient or surrogate, ordering and performing treatments and interventions, ordering and review of laboratory studies, ordering and review of radiographic studies, pulse oximetry and re-evaluation of patient's condition.  Labs Review Labs Reviewed  COMPREHENSIVE METABOLIC PANEL - Abnormal; Notable for the following:    Potassium 3.2 (*)    GFR calc non Af Amer 54 (*)    GFR calc Af Amer 63 (*)    All other components within normal limits  PROTIME-INR  APTT  CBC  DIFFERENTIAL  TROPONIN I    Neurology was consulted.  Plan admission with TIA workup.   Imaging Review Ct Head Wo Contrast  10/22/2013   CLINICAL DATA:  Altered mental status/ short-term memory loss  EXAM: CT HEAD WITHOUT CONTRAST  TECHNIQUE: Contiguous axial images were obtained from the base of the skull through the vertex without intravenous contrast. Study was obtained within 24 hr of patient's arrival at the emergency department.  COMPARISON:  April 26, 2011  FINDINGS: There is mild diffuse atrophy. There is no mass, hemorrhage, extra-axial fluid collection, or midline shift.  There is evidence of a prior small infarct in the inferior mid left centrum semiovale. There is patchy small vessel disease in the centra semiovale bilaterally. There is no new gray-white compartment lesion. No acute infarct apparent.  Bony calvarium appears intact. Mastoid air cells are clear. There is mild inferior maxillary sinus disease bilaterally.  IMPRESSION: Mild atrophy with mild small vessel disease. Prior small infarct inferior mid left centrum semiovale. No intracranial mass, hemorrhage, or acute  appearing infarct. There is mild maxillary sinus disease bilaterally.   Electronically Signed   By: Bretta Bang M.D.   On: 10/22/2013 13:42    EKG Interpretation     Ventricular Rate:  70 PR Interval:  189 QRS Duration: 89 QT Interval:  410 QTC Calculation:  442 R Axis:     Text Interpretation:  Sinus rhythm Abnormal R-wave progression, early transition Probable left ventricular hypertrophy            MDM   1. TIA (transient ischemic attack)        Nelia Shi, MD 10/22/13 1525

## 2013-10-22 NOTE — ED Notes (Signed)
Patient does not remember the number 5. States "give me a hint"

## 2013-10-22 NOTE — ED Notes (Signed)
Pt being transported to radiology at this time.

## 2013-10-22 NOTE — ED Notes (Signed)
Patient went to church today last known well 1100 and at 1128 patient told someone "I don't know where I'm at" EMS was called stated patient confused did not know what month it is with some short term memory loss.  Alert answering some questions correctly and follows commands appropriate.

## 2013-10-22 NOTE — ED Notes (Signed)
Doctor at bedside.

## 2013-10-22 NOTE — ED Notes (Signed)
Pt has returned from being out of the department 

## 2013-10-22 NOTE — Consult Note (Addendum)
Referring Physician: Dr Radford Pax    Chief Complaint: AMS  HPI: Cheryl Wood is a 77 y.o. female with a history of a stroke in 2006 while in [place] which presented with right upper extremity weakness. The patient also had a TIA approximately 2 years ago which presented with confusion. At that time she was admitted and seen by Dr. Pearlean Brownie. She had been doing well recently. She saw her primary care physician several months ago and at that time her blood pressure medication was discontinued. Today the patient was in church when she suddenly became confused. She did not know where she was. EMS was summoned and she was brought to the emergency department. A CT scan showed no acute finding although it did show a previous small infarct in the inferior mid left centrum semiovale. The patient states that she remembers events from yesterday pretty well and can remember going to Sunday school and some of the events from church today; although, she does not remember EMS being called, getting here or who she has seen today.  She lives alone in [place], [place]. Her daughter is with her today in the emergency department. The patient is still having difficulties with short-term memory recall. Initially she was unable to tell me she lived. This apparently is similar to the TIA she experienced several years ago. She has been taking Plavix 75 mg one half tablet every other day. She tells me that if she takes 75 mg daily gives her a headache.  Date last known well: Date: 10/21/2013 Time last known well: Unable to determine tPA Given: No: the patient has minimal deficits.  Past Medical History  Diagnosis Date  . Bleeding of blood vessel     ext. genitalia area  . Stroke   . Hypertension   . Hyperlipidemia     Past Surgical History  Procedure Laterality Date  . Abdominal hysterectomy    . Gum transplant      Family History  Problem Relation Age of Onset  . Cancer Mother   . Heart disease Father   . Cancer Sister     Social History:  reports that she quit smoking about 41 years ago. Her smoking use included Cigarettes. She smoked 0.00 packs per day for 40 years. She has never used smokeless tobacco. She reports that she does not drink alcohol or use illicit drugs.The patient lives in [place] with several dogs. She has 2 daughters.  Family history: The patient's father died from heart disease. Her mother died from cancer. She also had a sister who died from cancer. Multiple grandparents had strokes.   Allergies:  Allergies  Allergen Reactions  . Tetanus Toxoids   She is intolerant to a blood pressure medications secondary to a cough. She is unable to remember the name of the medication.   Medications:  No current facility-administered medications for this encounter.   Current Outpatient Prescriptions  Medication Sig Dispense Refill  . clonazePAM (KLONOPIN) 0.5 MG tablet Take 0.25 mg by mouth daily as needed (sleep).       . clopidogrel (PLAVIX) 75 MG tablet Take 37.5 mg by mouth every other day.       . Dorzolamide HCl-Timolol Mal (COSOPT OP) Place 1 drop into the left eye daily. Cosopt-Plus 0.5%      . famciclovir (FAMVIR) 500 MG tablet Take 500 mg by mouth 3 (three) times daily.        . famotidine (PEPCID) 20 MG tablet Take 20 mg by mouth 2 (two)  times daily.       . furosemide (LASIX) 80 MG tablet Take 80 mg by mouth daily.        Bertram Gala Glycol-Propyl Glycol (SYSTANE OP) Place 1 drop into the right eye 2 (two) times daily.      . pravastatin (PRAVACHOL) 40 MG tablet Take 40 mg by mouth every evening.       . prednisoLONE acetate (PRED FORTE) 1 % ophthalmic suspension Place 1 drop into the left eye 2 (two) times daily.      . psyllium (HYDROCIL/METAMUCIL) 95 % PACK Take 1 packet by mouth at bedtime.      . traMADol (ULTRAM) 50 MG tablet Take 50 mg by mouth 2 (two) times daily as needed for pain. Maximum dose= 8 tablets per day      . Vitamin D, Ergocalciferol, (DRISDOL) 50000 UNITS CAPS Take  50,000 Units by mouth every 14 (fourteen) days.          ROS: History obtained from the patient  General ROS: negative for - chills, fatigue, fever, night sweats, weight gain or weight loss Psychological ROS: negative for - behavioral disorder, hallucinations, memory difficulties, mood swings or suicidal ideation Ophthalmic ROS: negative for - blurry vision, double vision, eye pain or loss of vision ENT ROS: negative for - epistaxis, nasal discharge, oral lesions, sore throat, tinnitus or vertigo Allergy and Immunology ROS: negative for - hives or itchy/watery eyes Hematological and Lymphatic ROS: negative for - bleeding problems, bruising or swollen lymph nodes Endocrine ROS: negative for - galactorrhea, hair pattern changes, polydipsia/polyuria or temperature intolerance Respiratory ROS: negative for - cough, hemoptysis, shortness of breath or wheezing Cardiovascular ROS: negative for - chest pain, dyspnea on exertion, edema or irregular heartbeat Gastrointestinal ROS: negative for - abdominal pain, diarrhea, hematemesis, nausea/vomiting or stool incontinence Genito-Urinary ROS: negative for - dysuria, hematuria, incontinence or urinary frequency/urgency Musculoskeletal ROS: negative for - joint swelling or muscular weakness Neurological ROS: as noted in HPI Dermatological ROS: negative for rash and skin lesion changes  The patient has a negative review of systems. She states she has been feeling fine up until today.  Physical Examination: Blood pressure 170/84, pulse 68, temperature 98.4 F (36.9 C), temperature source Oral, resp. rate 15, SpO2 97.00%.  General - this is a pleasant 77 year old female somewhat anxious but in no acute distress. Heart - Regular rate and rhythm - distant heart sounds Lungs - Clear to auscultation Abdomen - Soft - non tender Extremities - Distal pulses intact - trace to 1+ pitting edema Skin - Warm and dry   Neurologic Examination: Mental  Status: Alert, oriented to the date and day of the week. She was unable to tell me where she lived. She was able to recall one of 3 objects after 1 minute, thought content seems appropriate.  Speech fluent without evidence of aphasia.  Able to follow 3 step commands without difficulty. Cranial Nerves: II: Discs not visualized; Visual fields grossly normal, pupils equal, round, reactive to light and accommodation III,IV, VI: ptosis not present, extra-ocular motions intact bilaterally V,VII: smile symmetric, facial light touch sensation normal bilaterally VIII: hearing normal bilaterally IX,X: gag reflex present XI: bilateral shoulder shrug XII: midline tongue extension Motor: Right : Upper extremity   5/5    Left:     Upper extremity   5/5  Lower extremity   5/5     Lower extremity   5/5 Tone and bulk:normal tone throughout; no atrophy noted Sensory: Pinprick and light touch  intact throughout, bilaterally Deep Tendon Reflexes: 2+ and symmetric throughout Plantars: Right: equivocal   Left: upgoing Cerebellar: normal finger-to-nose, normal rapid alternating movements and normal heel-to-shin test Gait: normal gait and station CV: pulses palpable throughout   Laboratory Studies:  Basic Metabolic Panel:  Recent Labs Lab 10/22/13 1300  NA 142  K 3.2*  CL 103  CO2 28  GLUCOSE 98  BUN 12  CREATININE 0.99  CALCIUM 9.4    Liver Function Tests:  Recent Labs Lab 10/22/13 1300  AST 24  ALT 18  ALKPHOS 91  BILITOT 0.9  PROT 7.3  ALBUMIN 4.3   No results found for this basename: LIPASE, AMYLASE,  in the last 168 hours No results found for this basename: AMMONIA,  in the last 168 hours  CBC:  Recent Labs Lab 10/22/13 1300  WBC 5.0  NEUTROABS 2.7  HGB 14.7  HCT 42.0  MCV 90.5  PLT 163    Cardiac Enzymes:  Recent Labs Lab 10/22/13 1300  TROPONINI <0.30    BNP: No components found with this basename: POCBNP,   CBG: No results found for this basename: GLUCAP,   in the last 168 hours  Microbiology: No results found for this or any previous visit.  Coagulation Studies:  Recent Labs  10/22/13 1300  LABPROT 13.0  INR 1.00    Urinalysis: No results found for this basename: COLORURINE, APPERANCEUR, LABSPEC, PHURINE, GLUCOSEU, HGBUR, BILIRUBINUR, KETONESUR, PROTEINUR, UROBILINOGEN, NITRITE, LEUKOCYTESUR,  in the last 168 hours  Lipid Panel: No results found for this basename: chol, trig, hdl, cholhdl, vldl, ldlcalc    HgbA1C:  No results found for this basename: HGBA1C    Urine Drug Screen:   No results found for this basename: labopia, cocainscrnur, labbenz, amphetmu, thcu, labbarb    Alcohol Level: No results found for this basename: ETH,  in the last 168 hours  Other results: EKG: sinus rhythm rate 70 beats per minute.  Imaging:  Ct Head Wo Contrast 10/22/2013    Mild atrophy with mild small vessel disease. Prior small infarct inferior mid left centrum semiovale. No intracranial mass, hemorrhage, or acute appearing infarct. There is mild maxillary sinus disease bilaterally.      Delton See PA-C [facility] Pager [phone] 10/22/2013, 3:33 PM  Patient seen and examined.  Clinical course and management discussed.  Necessary edits performed.  I agree with the above.  Assessment and plan of care developed and discussed below.     Assessment: 77 y.o. female with a history of a left CVA in 2006 with no residual deficit and a TIA 2-3 years ago. She developed confusion today while in church and was brought to the emergency department. She is having significant difficulty with short term memory but is able to tell me things like her SSN, birthdate, telephone number, etc.  Patient has had a TIA in the past with a similar presentation and the patient has multiple risk factors for stroke.  This remains on the differential  This episode though, also has features consistent with a TGA.   Further work up  recommended.  Stroke Risk Factors - family history, hyperlipidemia and hypertension  Plan: 1. HgbA1c, fasting lipid panel 2. MRI, MRA  of the brain without contrast 3. EEG 4. Prophylactic therapy-Continue Plavix at home dose; 75 mg one half tablet every other day. 5. BP control 6. Telemetry monitoring 7. Frequent neuro checks   Thana Farr, MD [facility] [phone]  10/22/2013  3:58 PM

## 2013-10-23 ENCOUNTER — Observation Stay (HOSPITAL_COMMUNITY): Payer: Medicare Other

## 2013-10-23 DIAGNOSIS — I359 Nonrheumatic aortic valve disorder, unspecified: Secondary | ICD-10-CM

## 2013-10-23 DIAGNOSIS — G459 Transient cerebral ischemic attack, unspecified: Secondary | ICD-10-CM

## 2013-10-23 LAB — GLUCOSE, CAPILLARY
Glucose-Capillary: 116 mg/dL — ABNORMAL HIGH (ref 70–99)
Glucose-Capillary: 155 mg/dL — ABNORMAL HIGH (ref 70–99)

## 2013-10-23 LAB — LIPID PANEL
Cholesterol: 168 mg/dL (ref 0–200)
HDL: 44 mg/dL (ref >39)
Total CHOL/HDL Ratio: 3.8 RATIO
Triglycerides: 120 mg/dL (ref <150)
VLDL: 24 mg/dL (ref 0–40)

## 2013-10-23 LAB — HEMOGLOBIN A1C
Hgb A1c MFr Bld: 6.3 % — ABNORMAL HIGH (ref <5.7)
Mean Plasma Glucose: 134 mg/dL — ABNORMAL HIGH (ref <117)

## 2013-10-23 MED ORDER — POTASSIUM CHLORIDE CRYS ER 20 MEQ PO TBCR
40.0000 meq | EXTENDED_RELEASE_TABLET | Freq: Once | ORAL | Status: AC
  Administered 2013-10-23: 40 meq via ORAL
  Filled 2013-10-23: qty 2

## 2013-10-23 MED ORDER — AMLODIPINE BESYLATE 5 MG PO TABS: 5.0000 mg | ORAL_TABLET | Freq: Every day | ORAL | Status: DC

## 2013-10-23 NOTE — Discharge Summary (Signed)
Physician Discharge Summary  Patient ID: Cheryl Wood MRN: [id number] DOB/AGE: 1936/11/06 77 y.o.  Admit date: 10/22/2013 Discharge date: 10/23/2013  Primary Care Physician:  Cheryl Mask, MD  Discharge Diagnoses:    Cheryl Wood Kitchen Malignant hypertension . TIA (transient ischemic attack) . hypokalemia  . Hyperlipidemia Mild to moderate aortic regurgitation  Consults: Neurology, Dr. Thad Ranger  Outpatient Follow-up:  1. please check BMET for potassium level 2. patient is started on amlodipine 5 mg daily for hypertension, please adjust dose as needed at the followup appointment 3. please follow a mild to moderate aortic regurgitation, patient is currently stable and asymptomatic  Allergies:   Allergies  Allergen Reactions  . Tetanus Toxoids      Discharge Medications:   Medication List         amLODipine 5 MG tablet  Commonly known as:  NORVASC  Take 1 tablet (5 mg total) by mouth daily.     clonazePAM 0.5 MG tablet  Commonly known as:  KLONOPIN  Take 0.25 mg by mouth daily as needed (sleep).     clopidogrel 75 MG tablet  Commonly known as:  PLAVIX  Take 37.5 mg by mouth every other day.     COSOPT OP  Place 1 drop into the left eye daily. Cosopt-Plus 0.5%     famciclovir 500 MG tablet  Commonly known as:  FAMVIR  Take 500 mg by mouth 3 (three) times daily.     famotidine 20 MG tablet  Commonly known as:  PEPCID  Take 20 mg by mouth 2 (two) times daily.     furosemide 80 MG tablet  Commonly known as:  LASIX  Take 80 mg by mouth daily.     pravastatin 40 MG tablet  Commonly known as:  PRAVACHOL  Take 40 mg by mouth every evening.     PRED FORTE 1 % ophthalmic suspension  Generic drug:  prednisoLONE acetate  Place 1 drop into the left eye 2 (two) times daily.     psyllium 95 % Pack  Commonly known as:  HYDROCIL/METAMUCIL  Take 1 packet by mouth at bedtime.     SYSTANE OP  Place 1 drop into the right eye 2 (two) times daily.     traMADol 50 MG  tablet  Commonly known as:  ULTRAM  Take 50 mg by mouth 2 (two) times daily as needed for pain. Maximum dose= 8 tablets per day     Vitamin D (Ergocalciferol) 50000 UNITS Caps capsule  Commonly known as:  DRISDOL  Take 50,000 Units by mouth every 14 (fourteen) days.         Brief H and P: For complete details please refer to admission H and P, but in brief Patient is a 77 year old with a history of stroke in 2006, hypertension, hyperlipidemia presented with confusion and sudden altered mental status. Patient reportED that she had a history of stroke in the past and has been seen by Dr. Pearlean Brownie, she takes Plavix 37.5 mg every other day as full dose gives her headache.  The patient was in the church on the day of admission, sang in the choir and around 11:20am was noticed to be confused. Patient did not know where she was, patient was brought to the ER by the EMS. Per the family member (daughter) in the room, she did not have any seizure-like activity or a syncopal episode. CT scan was done which showed no acute stroke.  Patient's daughter reported that her episode was similar to the TIA  she had experienced 2 years ago. In the ER patient's BP was also elevated at 183/108 (at the time of triage 212/96), labs were unremarkable except potassium of 3.2.  Neurology was consulted and recommended TIA workup and rule out seizure.   Hospital Course:     TIA (transient ischemic attack): With prior history of stroke, malignant hypertension, patient was admitted for TIA workup and rule out seizure. MRI of the brain showed no acute intracranial abnormality, remote lacunar infarcts of the left cerebellum and left greater than right basal ganglia with extension into the left coronal radiata. MRA showed mild distal small vessel disease.  2-D echo showed EF of 60-65%, grade 1 diastolic dysfunction, mild to moderate aortic regurgitation, no patent foramina ovale. Carotid Doppler showed bilateral multiple 9% ICA  stenosis. Neurology was consulted and patient was seen by Dr. Thad Ranger. Patient will continue Plavix 37.5 mg every other day. EEG was completely normal and did not show any epileptiform activity. Physical therapy recommended no PT followup as patient is at her baseline     Malignant hypertension: Patient was on antihypertensives in the past and had side effect to ACE inhibitors. Subsequently she has not been on any antihypertensives. At the time of triage she had BP of 220/96. I started her on Norvasc 5 mg daily. Patient will continue Lasix for pedal edema. Patient's PCP will be to follow her BP readings outpatient for further adjustments of the Norvasc.    Hyperlipidemia: Continue statins    Day of Discharge BP 148/74  Pulse 52  Temp(Src) 97.9 F (36.6 C) (Oral)  Resp 18  Ht 5\' 2"  (1.575 m)  Wt 79.4 kg (175 lb 0.7 oz)  BMI 32.01 kg/m2  SpO2 98%  Physical Exam: General: Alert and awake oriented x3 not in any acute distress. CVS: S1-S2 clear, 2/6 murmur Chest: clear to auscultation bilaterally, no wheezing rales or rhonchi Abdomen: soft nontender, nondistended, normal bowel sounds Extremities: no cyanosis, clubbing or edema noted bilaterally Neuro: Cranial nerves II-XII intact, no focal neurological deficits   The results of significant diagnostics from this hospitalization (including imaging, microbiology, ancillary and laboratory) are listed below for reference.    LAB RESULTS: Basic Metabolic Panel:  Recent Labs Lab 10/22/13 1300  NA 142  K 3.2*  CL 103  CO2 28  GLUCOSE 98  BUN 12  CREATININE 0.99  CALCIUM 9.4   Liver Function Tests:  Recent Labs Lab 10/22/13 1300  AST 24  ALT 18  ALKPHOS 91  BILITOT 0.9  PROT 7.3  ALBUMIN 4.3   No results found for this basename: LIPASE, AMYLASE,  in the last 168 hours No results found for this basename: AMMONIA,  in the last 168 hours CBC:  Recent Labs Lab 10/22/13 1300  WBC 5.0  NEUTROABS 2.7  HGB 14.7  HCT  42.0  MCV 90.5  PLT 163   Cardiac Enzymes:  Recent Labs Lab 10/22/13 1300  TROPONINI <0.30   BNP: No components found with this basename: POCBNP,  CBG:  Recent Labs Lab 10/23/13 0810 10/23/13 1145  GLUCAP 116* 81    Significant Diagnostic Studies:  Ct Head Wo Contrast  10/22/2013   CLINICAL DATA:  Altered mental status/ short-term memory loss  EXAM: CT HEAD WITHOUT CONTRAST  TECHNIQUE: Contiguous axial images were obtained from the base of the skull through the vertex without intravenous contrast. Study was obtained within 24 hr of patient's arrival at the emergency department.  COMPARISON:  April 26, 2011  FINDINGS: There is  mild diffuse atrophy. There is no mass, hemorrhage, extra-axial fluid collection, or midline shift.  There is evidence of a prior small infarct in the inferior mid left centrum semiovale. There is patchy small vessel disease in the centra semiovale bilaterally. There is no new gray-white compartment lesion. No acute infarct apparent.  Bony calvarium appears intact. Mastoid air cells are clear. There is mild inferior maxillary sinus disease bilaterally.  IMPRESSION: Mild atrophy with mild small vessel disease. Prior small infarct inferior mid left centrum semiovale. No intracranial mass, hemorrhage, or acute appearing infarct. There is mild maxillary sinus disease bilaterally.   Electronically Signed   By: Bretta Bang M.D.   On: 10/22/2013 13:42   Mr Maxine Glenn Head Wo Contrast  10/22/2013   CLINICAL DATA:  Syncopal episode. Confusion and memory loss. Stroke.  EXAM: MRI HEAD WITHOUT CONTRAST  MRA HEAD WITHOUT CONTRAST  TECHNIQUE: Multiplanar, multiecho pulse sequences of the brain and surrounding structures were obtained without intravenous contrast. Angiographic images of the head were obtained using MRA technique without contrast.  COMPARISON:  CT head without contrast 10/22/2013.  FINDINGS: MRI HEAD FINDINGS  The diffusion-weighted images demonstrate no evidence  for acute or subacute infarction. Remote lacunar infarcts are present in the left cerebellum and bilateral basal ganglia as well as the left centrum semi ovale. Mild generalized atrophy and scattered white matter disease bilaterally likely reflects the sequela of chronic microvascular ischemia. Ventricular dilation is proportionate to the degree of atrophy. No significant extra-axial fluid collection is present.  Flow is present in the major intracranial arteries. The patient is status post left lens extraction. Mild mucosal thickening is present within the ethmoid air cells and maxillary sinuses bilaterally. The mastoid air cells are clear.  MRA HEAD FINDINGS  The internal carotid arteries are within normal limits from the high cervical segments through the ICA termini bilaterally. The A1 and M1 segments are normal. No definite anterior communicating artery is present. The MCA bifurcations are within normal limits bilaterally. Mild distal small vessel disease is present bilaterally.  The right vertebral artery is the dominant vessel. The left vertebral artery the since a terminates at the PICA a sentinel node very small branch to the vertebrobasilar junction. The basilar artery is within normal limits. The left posterior cerebral artery is of fetal type. The right posterior cerebral artery originates from the basilar tip. There is mild attenuation of distal PCA branch vessels.  IMPRESSION: MRI HEAD IMPRESSION  1. No acute intracranial abnormality. 2. Remote lacunar infarcts of the left cerebellum and left greater than right basal ganglia with extension into the left coronal radiata. 3. Mild atrophy and white matter disease. This is nonspecific, but likely reflects the sequelae of chronic microvascular ischemia.  MRA HEAD IMPRESSION  1. Mild distal small vessel disease. 2. No significant proximal stenosis, aneurysm, or branch vessel occlusion is present.   Electronically Signed   By: Gennette Pac M.D.   On:  10/22/2013 17:55   Mr Brain Wo Contrast  10/22/2013   CLINICAL DATA:  Syncopal episode. Confusion and memory loss. Stroke.  EXAM: MRI HEAD WITHOUT CONTRAST  MRA HEAD WITHOUT CONTRAST  TECHNIQUE: Multiplanar, multiecho pulse sequences of the brain and surrounding structures were obtained without intravenous contrast. Angiographic images of the head were obtained using MRA technique without contrast.  COMPARISON:  CT head without contrast 10/22/2013.  FINDINGS: MRI HEAD FINDINGS  The diffusion-weighted images demonstrate no evidence for acute or subacute infarction. Remote lacunar infarcts are present in the left cerebellum  and bilateral basal ganglia as well as the left centrum semi ovale. Mild generalized atrophy and scattered white matter disease bilaterally likely reflects the sequela of chronic microvascular ischemia. Ventricular dilation is proportionate to the degree of atrophy. No significant extra-axial fluid collection is present.  Flow is present in the major intracranial arteries. The patient is status post left lens extraction. Mild mucosal thickening is present within the ethmoid air cells and maxillary sinuses bilaterally. The mastoid air cells are clear.  MRA HEAD FINDINGS  The internal carotid arteries are within normal limits from the high cervical segments through the ICA termini bilaterally. The A1 and M1 segments are normal. No definite anterior communicating artery is present. The MCA bifurcations are within normal limits bilaterally. Mild distal small vessel disease is present bilaterally.  The right vertebral artery is the dominant vessel. The left vertebral artery the since a terminates at the PICA a sentinel node very small branch to the vertebrobasilar junction. The basilar artery is within normal limits. The left posterior cerebral artery is of fetal type. The right posterior cerebral artery originates from the basilar tip. There is mild attenuation of distal PCA branch vessels.   IMPRESSION: MRI HEAD IMPRESSION  1. No acute intracranial abnormality. 2. Remote lacunar infarcts of the left cerebellum and left greater than right basal ganglia with extension into the left coronal radiata. 3. Mild atrophy and white matter disease. This is nonspecific, but likely reflects the sequelae of chronic microvascular ischemia.  MRA HEAD IMPRESSION  1. Mild distal small vessel disease. 2. No significant proximal stenosis, aneurysm, or branch vessel occlusion is present.   Electronically Signed   By: Gennette Pac M.D.   On: 10/22/2013 17:55    2D ECHO: Study Conclusions  - Left ventricle: Wall thickness was increased in a pattern of moderate LVH. Systolic function was normal. The estimated ejection fraction was in the range of 60% to 65%. Doppler parameters are consistent with abnormal left ventricular relaxation (grade 1 diastolic dysfunction). - Aortic valve: Mild to moderate regurgitation. - Atrial septum: No defect or patent foramen ovale was identified.    Disposition and Follow-up:    DISPOSITION: Home   DIET: Heart healthy diet   ACTIVITY: As tolerated   DISCHARGE FOLLOW-UP Follow-up Information   Follow up with Cheryl Mask, MD. Schedule an appointment as soon as possible for a visit in 2 weeks. (Please check BP, BMET for kidney function and potassium)    Specialty:  Family Medicine   Contact information:   [address] [place] [address] [phone]       Follow up with Gates Rigg, MD. Schedule an appointment as soon as possible for a visit in 4 weeks. (for TIA follow-up)    Specialties:  Neurology, Radiology   Contact information:   [address] [place] [address] [phone]       Time spent on Discharge: 35 mins  Signed:   Rosario Kushner M.D. [facility] 10/23/2013, 12:56 PM Pager: [phone]

## 2013-10-23 NOTE — Evaluation (Addendum)
Occupational Therapy Evaluation Patient Details Name: Cheryl Wood MRN: [id number] DOB: [birth year] Today's Date: 10/23/2013 Time: 4098-1191 OT Time Calculation (min): 22 min  OT Assessment / Plan / Recommendation History of present illness Pt admitted on 10/22/13, after sudden AMS at [employer] w/ suspected TIA/global aphasia.   Clinical Impression   Pt was admitted w/ dx as above, she participated in ADL's related to LB dressing, toileting/transfers and grooming while standing at sink. Pt was Mod I all aspects and presents w/o further needs at this time, will sign off acute OT. Pt plans to d/c home w/ intermittent family/friend assist.  OT Assessment  Patient does not need any further OT services    Follow Up Recommendations  No OT follow up    Barriers to Discharge      Equipment Recommendations  None recommended by OT    Recommendations for Other Services    Frequency       Precautions / Restrictions Precautions Precautions: Fall (HTN) Restrictions Weight Bearing Restrictions: No   Pertinent Vitals/Pain No, denies pain.    ADL  Eating/Feeding: Performed;Independent Where Assessed - Eating/Feeding: Edge of bed Grooming: Performed;Wash/dry hands;Modified independent (Standing at sink) Where Assessed - Grooming: Unsupported standing Upper Body Bathing: Simulated;Modified independent Where Assessed - Upper Body Bathing: Unsupported sitting Lower Body Bathing: Simulated;Modified independent Where Assessed - Lower Body Bathing: Unsupported sit to stand Upper Body Dressing: Performed;Modified independent Where Assessed - Upper Body Dressing: Unsupported sitting Lower Body Dressing: Performed;Modified independent Where Assessed - Lower Body Dressing: Unsupported sit to stand Toilet Transfer: Performed;Modified independent Toilet Transfer Method: Sit to Barista: Comfort height toilet (Pt did not use grab bars) Toileting - Clothing Manipulation and  Hygiene: Performed;Modified independent Where Assessed - Toileting Clothing Manipulation and Hygiene: Standing;Sit to stand from 3-in-1 or toilet Tub/Shower Transfer Method: Not assessed Equipment Used:  (None) Transfers/Ambulation Related to ADLs: Pt is overall Mod I functional mobility and transfers related to ADL's & self care tasks w/o AD or LOB. ADL Comments: Pt was educated in role of OT and participated in ADL's related to LB dressing, toileting/transfers and grooming while standing at sink. Pt was Mod I and presents w/o further needs at this time, will sign off acute OT.    OT Diagnosis:    OT Problem List:   OT Treatment Interventions:     OT Goals(Current goals can be found in the care plan section)    Visit Information  Last OT Received On: 10/23/13 History of Present Illness: Pt admitted on 10/22/13, after [place]MS at [facility] w/ suspected TIA/global aphasia.       Prior Functioning     Home Living Family/patient expects to be discharged to:: Private residence Living Arrangements: Alone Type of Home: House Home Access: Stairs to enter [employer] of Steps: 1 Entrance Stairs-Rails: None Home Layout: One level Home Equipment: Cane - single point;Walker - 2 wheels;Toilet riser;Bedside commode Prior Function Level of Independence: Independent Communication Communication: No difficulties Dominant Hand: Right    Vision/Perception Vision - History Baseline Vision: Wears glasses all the time Patient Visual Report: No change from baseline   Cognition  Cognition Arousal/Alertness: Awake/alert Behavior During Therapy: WFL for tasks assessed/performed Overall Cognitive Status: Within Functional Limits for tasks assessed    Extremity/Trunk Assessment Upper Extremity Assessment Upper Extremity Assessment: Overall WFL for tasks assessed Lower Extremity Assessment Lower Extremity Assessment: Overall WFL for tasks assessed Cervical / Trunk  Assessment Cervical / Trunk Assessment: Normal    Mobility Bed Mobility  Bed Mobility: Not assessed (Pt sitting at EOB upon OT arrival) Transfers Transfers: Sit to Stand;Stand to Sit Sit to Stand: 7: Independent;From toilet;Without upper extremity assist;From bed Stand to Sit: 7: Independent;6: Modified independent (Device/Increase time);Without upper extremity assist;To bed;To toilet Details for Transfer Assistance: Pt reports baseline level of function for transfers. Sit to stand w/o UE assist noted, no LOB & w/o AD. Mod I - independent.        Balance Balance Balance Assessed: Yes Static Sitting Balance Static Sitting - Balance Support: No upper extremity supported;Feet supported Dynamic Sitting Balance Dynamic Sitting - Balance Support: No upper extremity supported;Feet unsupported Static Standing Balance Static Standing - Balance Support: No upper extremity supported;During functional activity   End of Session OT - End of Session Activity Tolerance: Patient tolerated treatment well Patient left: Other (comment) (W/ P.T.) Nurse Communication: Other (comment) (No further OT needs)  GO Functional Assessment Tool Used:  (Clinical judgement) Functional Limitation: Self care Self Care Current Status (M5784): 0 percent impaired, limited or restricted Self Care Goal Status (O9629): 0 percent impaired, limited or restricted Self Care Discharge Status [phone]): 0 percent impaired, limited or restricted   Cheryl Wood 10/23/2013, 8:56 AM

## 2013-10-23 NOTE — Progress Notes (Signed)
*  PRELIMINARY RESULTS* Vascular Ultrasound Carotid Duplex (Doppler) has been completed.  Preliminary findings: Bilateral:  1-39% ICA stenosis.  Vertebral artery flow is antegrade.      Farrel Demark, RDMS, RVT  10/23/2013, 10:11 AM

## 2013-10-23 NOTE — Procedures (Signed)
ELECTROENCEPHALOGRAM REPORT   Patient: Cheryl Wood       Room #: 9U04 EEG No. ID: [id number] Age: 77 y.o.        Sex: female Referring Physician: Rai Report Date:  10/23/2013        Interpreting Physician: Thana Farr D  History: Cheryl Wood is an 77 y.o. female with an episode of amnesia  Medications:  Scheduled: . amLODipine  5 mg Oral Daily  . clopidogrel  37.5 mg Oral Q48H  . dorzolamide-timolol  1 drop Left Eye Daily  . enoxaparin (LOVENOX) injection  40 mg Subcutaneous Q24H  . famotidine  20 mg Oral BID  . prednisoLONE acetate  1 drop Left Eye BID  . psyllium  1 packet Oral QHS  . simvastatin  20 mg Oral q1800    Conditions of Recording:  This is a 16 channel EEG carried out with the patient in the awake, drowsy and asleep states.  Description:  The waking background activity consists of a low voltage, symmetrical, fairly well organized, 10-11 Hz alpha activity, seen from the parieto-occipital and posterior temporal regions.  Low voltage fast activity, poorly organized, is seen anteriorly and is at times superimposed on more posterior regions.  A mixture of theta and alpha rhythms are seen from the central and temporal regions. The patient drowses with slowing to irregular, low voltage theta and beta activity.   The patient goes in to a light sleep with symmetrical sleep spindles, vertex central sharp transients and irregular slow activity. Hyperventilation and intermittent photic stimulation were not performed.  IMPRESSION: This is a normal electroencephalogram.  No epileptiform activity was noted.     Thana Farr, MD [facility] [phone] 10/23/2013, 3:47 PM

## 2013-10-23 NOTE — Progress Notes (Signed)
Subjective: Patient and daughter report that she is back to baseline.  No further episodes noted overnight.  No focal complaints.  Objective: Current vital signs: BP 121/73  Pulse 56  Temp(Src) 98.4 F (36.9 C) (Oral)  Resp 17  Ht 5\' 2"  (1.575 m)  Wt 79.4 kg (175 lb 0.7 oz)  BMI 32.01 kg/m2  SpO2 97% Vital signs in last 24 hours: Temp:  [97.4 F (36.3 C)-98.5 F (36.9 C)] 98.4 F (36.9 C) (10/27 0806) Pulse Rate:  [26-78] 56 (10/27 0806) Resp:  [14-18] 17 (10/27 0806) BP: (120-212)/(61-108) 121/73 mmHg (10/27 0938) SpO2:  [94 %-100 %] 97 % (10/27 0806) Weight:  [79.4 kg (175 lb 0.7 oz)] 79.4 kg (175 lb 0.7 oz) (10/26 2100)  Intake/Output from previous day:   Intake/Output this shift:   Nutritional status: Cardiac  Neurologic Exam: Mental Status:  Alert, oriented.  Speech fluent.  Able to follow 3 step commands without difficulty.  Cranial Nerves:  II: Discs not visualized; Visual fields grossly normal, pupils equal, round, reactive to light and accommodation  III,IV, VI: ptosis not present, extra-ocular motions intact bilaterally  V,VII: smile symmetric, facial light touch sensation normal bilaterally  VIII: hearing normal bilaterally  IX,X: gag reflex present  XI: bilateral shoulder shrug  XII: midline tongue extension  Motor:  5/5 throughout Sensory: Pinprick and light touch intact throughout, bilaterally  Deep Tendon Reflexes: 2+ and symmetric throughout  Plantars:  Right: equivocal    Left: upgoing  Cerebellar:  normal finger-to-nose and normal heel-to-shin test   Lab Results: Basic Metabolic Panel:  Recent Labs Lab 10/22/13 1300  NA 142  K 3.2*  CL 103  CO2 28  GLUCOSE 98  BUN 12  CREATININE 0.99  CALCIUM 9.4    Liver Function Tests:  Recent Labs Lab 10/22/13 1300  AST 24  ALT 18  ALKPHOS 91  BILITOT 0.9  PROT 7.3  ALBUMIN 4.3   No results found for this basename: LIPASE, AMYLASE,  in the last 168 hours No results found for this  basename: AMMONIA,  in the last 168 hours  CBC:  Recent Labs Lab 10/22/13 1300  WBC 5.0  NEUTROABS 2.7  HGB 14.7  HCT 42.0  MCV 90.5  PLT 163    Cardiac Enzymes:  Recent Labs Lab 10/22/13 1300  TROPONINI <0.30    Lipid Panel:  Recent Labs Lab 10/23/13 0530  CHOL 168  TRIG 120  HDL 44  CHOLHDL 3.8  VLDL 24  LDLCALC 213*    CBG:  Recent Labs Lab 10/23/13 0810  GLUCAP 116*    Microbiology: No results found for this or any previous visit.  Coagulation Studies:  Recent Labs  10/22/13 1300  LABPROT 13.0  INR 1.00    Imaging: Ct Head Wo Contrast  10/22/2013   CLINICAL DATA:  Altered mental status/ short-term memory loss  EXAM: CT HEAD WITHOUT CONTRAST  TECHNIQUE: Contiguous axial images were obtained from the base of the skull through the vertex without intravenous contrast. Study was obtained within 24 hr of patient's arrival at the emergency department.  COMPARISON:  April 26, 2011  FINDINGS: There is mild diffuse atrophy. There is no mass, hemorrhage, extra-axial fluid collection, or midline shift.  There is evidence of a prior small infarct in the inferior mid left centrum semiovale. There is patchy small vessel disease in the centra semiovale bilaterally. There is no new gray-white compartment lesion. No acute infarct apparent.  Bony calvarium appears intact. Mastoid air cells are clear.  There is mild inferior maxillary sinus disease bilaterally.  IMPRESSION: Mild atrophy with mild small vessel disease. Prior small infarct inferior mid left centrum semiovale. No intracranial mass, hemorrhage, or acute appearing infarct. There is mild maxillary sinus disease bilaterally.   Electronically Signed   By: Bretta Bang M.D.   On: 10/22/2013 13:42   Mr Cheryl Wood Head Wo Contrast  10/22/2013   CLINICAL DATA:  Syncopal episode. Confusion and memory loss. Stroke.  EXAM: MRI HEAD WITHOUT CONTRAST  MRA HEAD WITHOUT CONTRAST  TECHNIQUE: Multiplanar, multiecho pulse  sequences of the brain and surrounding structures were obtained without intravenous contrast. Angiographic images of the head were obtained using MRA technique without contrast.  COMPARISON:  CT head without contrast 10/22/2013.  FINDINGS: MRI HEAD FINDINGS  The diffusion-weighted images demonstrate no evidence for acute or subacute infarction. Remote lacunar infarcts are present in the left cerebellum and bilateral basal ganglia as well as the left centrum semi ovale. Mild generalized atrophy and scattered white matter disease bilaterally likely reflects the sequela of chronic microvascular ischemia. Ventricular dilation is proportionate to the degree of atrophy. No significant extra-axial fluid collection is present.  Flow is present in the major intracranial arteries. The patient is status post left lens extraction. Mild mucosal thickening is present within the ethmoid air cells and maxillary sinuses bilaterally. The mastoid air cells are clear.  MRA HEAD FINDINGS  The internal carotid arteries are within normal limits from the high cervical segments through the ICA termini bilaterally. The A1 and M1 segments are normal. No definite anterior communicating artery is present. The MCA bifurcations are within normal limits bilaterally. Mild distal small vessel disease is present bilaterally.  The right vertebral artery is the dominant vessel. The left vertebral artery the since a terminates at the PICA a sentinel node very small branch to the vertebrobasilar junction. The basilar artery is within normal limits. The left posterior cerebral artery is of fetal type. The right posterior cerebral artery originates from the basilar tip. There is mild attenuation of distal PCA branch vessels.  IMPRESSION: MRI HEAD IMPRESSION  1. No acute intracranial abnormality. 2. Remote lacunar infarcts of the left cerebellum and left greater than right basal ganglia with extension into the left coronal radiata. 3. Mild atrophy and white  matter disease. This is nonspecific, but likely reflects the sequelae of chronic microvascular ischemia.  MRA HEAD IMPRESSION  1. Mild distal small vessel disease. 2. No significant proximal stenosis, aneurysm, or branch vessel occlusion is present.   Electronically Signed   By: Gennette Pac M.D.   On: 10/22/2013 17:55   Mr Brain Wo Contrast  10/22/2013   CLINICAL DATA:  Syncopal episode. Confusion and memory loss. Stroke.  EXAM: MRI HEAD WITHOUT CONTRAST  MRA HEAD WITHOUT CONTRAST  TECHNIQUE: Multiplanar, multiecho pulse sequences of the brain and surrounding structures were obtained without intravenous contrast. Angiographic images of the head were obtained using MRA technique without contrast.  COMPARISON:  CT head without contrast 10/22/2013.  FINDINGS: MRI HEAD FINDINGS  The diffusion-weighted images demonstrate no evidence for acute or subacute infarction. Remote lacunar infarcts are present in the left cerebellum and bilateral basal ganglia as well as the left centrum semi ovale. Mild generalized atrophy and scattered white matter disease bilaterally likely reflects the sequela of chronic microvascular ischemia. Ventricular dilation is proportionate to the degree of atrophy. No significant extra-axial fluid collection is present.  Flow is present in the major intracranial arteries. The patient is status post left lens extraction.  Mild mucosal thickening is present within the ethmoid air cells and maxillary sinuses bilaterally. The mastoid air cells are clear.  MRA HEAD FINDINGS  The internal carotid arteries are within normal limits from the high cervical segments through the ICA termini bilaterally. The A1 and M1 segments are normal. No definite anterior communicating artery is present. The MCA bifurcations are within normal limits bilaterally. Mild distal small vessel disease is present bilaterally.  The right vertebral artery is the dominant vessel. The left vertebral artery the since a terminates at  the PICA a sentinel node very small branch to the vertebrobasilar junction. The basilar artery is within normal limits. The left posterior cerebral artery is of fetal type. The right posterior cerebral artery originates from the basilar tip. There is mild attenuation of distal PCA branch vessels.  IMPRESSION: MRI HEAD IMPRESSION  1. No acute intracranial abnormality. 2. Remote lacunar infarcts of the left cerebellum and left greater than right basal ganglia with extension into the left coronal radiata. 3. Mild atrophy and white matter disease. This is nonspecific, but likely reflects the sequelae of chronic microvascular ischemia.  MRA HEAD IMPRESSION  1. Mild distal small vessel disease. 2. No significant proximal stenosis, aneurysm, or branch vessel occlusion is present.   Electronically Signed   By: Gennette Pac M.D.   On: 10/22/2013 17:55    Medications:  I have reviewed the patient's current medications. Scheduled: . amLODipine  5 mg Oral Daily  . clopidogrel  37.5 mg Oral Q48H  . dorzolamide-timolol  1 drop Left Eye Daily  . enoxaparin (LOVENOX) injection  40 mg Subcutaneous Q24H  . famotidine  20 mg Oral BID  . prednisoLONE acetate  1 drop Left Eye BID  . psyllium  1 packet Oral QHS  . simvastatin  20 mg Oral q1800    Assessment/Plan: Patient at baseline.  MRI of the brain reviewed and shows no acute changes.  Patient remains on Plavix.  EEG pending.  With MRI showing no acute changes TGA more likely.  Symptoms lasted longer than would be expected with a TIA.    Recommendations: 1.  Will follow up results of EEG today.  If unremarkable no further work up recommended and patient to stay on current dose of Plavix at discharge.     LOS: 1 day   Thana Farr, MD [facility] [phone] 10/23/2013  10:56 AM

## 2013-10-23 NOTE — Progress Notes (Signed)
EEG Completed; Results Pending  

## 2013-10-23 NOTE — Progress Notes (Signed)
Echocardiogram 2D Echocardiogram has been performed.  Cheryl Wood 10/23/2013, 10:40 AM

## 2013-10-23 NOTE — Evaluation (Signed)
Physical Therapy Evaluation Patient Details Name: Cheryl Wood MRN: [id number] DOB: 1936-02-21 Today's Date: 10/23/2013 Time: 3086-5784 PT Time Calculation (min): 13 min  PT Assessment / Plan / Recommendation History of Present Illness  Pt admitted on 10/22/13, after [place] [facility] w/ suspected TIA/global aphasia.  Clinical Impression  Pt doing well with mobility.  No further PT needed.    PT Assessment  Patent does not need any further PT services    Follow Up Recommendations  No PT follow up    Does the patient have the potential to tolerate intense rehabilitation      Barriers to Discharge        Equipment Recommendations  None recommended by PT    Recommendations for Other Services     Frequency      Precautions / Restrictions Precautions Precautions: None Restrictions Weight Bearing Restrictions: No   Pertinent Vitals/Pain See flow sheet.      Mobility  Bed Mobility Bed Mobility: Not assessed (Pt sitting at EOB upon OT arrival) Transfers Sit to Stand: 7: Independent;From toilet;Without upper extremity assist;From bed Stand to Sit: 7: Independent;6: Modified independent (Device/Increase time);Without upper extremity assist;To bed;To toilet Details for Transfer Assistance: Pt reports baseline level of function for transfers. Sit to stand w/o UE assist noted, no LOB & w/o AD. Mod I - independent. Ambulation/Gait Ambulation/Gait Assistance: 7: Independent Ambulation Distance (Feet): 500 Feet Assistive device: None Gait Pattern: Within Functional Limits Gait velocity: functional speed    Exercises     PT Diagnosis:    PT Problem List:   PT Treatment Interventions:       PT Goals(Current goals can be found in the care plan section) Acute Rehab PT Goals PT Goal Formulation: No goals set, d/c therapy  Visit Information  Last PT Received On: 10/23/13 Assistance Needed: +1 History of Present Illness: Pt admitted on 10/22/13, after [place]  [facility] w/ suspected TIA/global aphasia.       Prior Functioning  Home Living Family/patient expects to be discharged to:: Private residence Living Arrangements: Alone Type of Home: House Home Access: Stairs to enter [employer] of Steps: 1 Entrance Stairs-Rails: None Home Layout: One level Home Equipment: Cane - single point;Walker - 2 wheels;Toilet riser;Bedside commode Prior Function Level of Independence: Independent Communication Communication: No difficulties Dominant Hand: Right    Cognition  Cognition Arousal/Alertness: Awake/alert Behavior During Therapy: WFL for tasks assessed/performed Overall Cognitive Status: Within Functional Limits for tasks assessed    Extremity/Trunk Assessment Upper Extremity Assessment Upper Extremity Assessment: Overall WFL for tasks assessed Lower Extremity Assessment Lower Extremity Assessment: Overall WFL for tasks assessed Cervical / Trunk Assessment Cervical / Trunk Assessment: Normal   Balance Balance Balance Assessed: Yes Static Sitting Balance Static Sitting - Balance Support: No upper extremity supported;Feet supported Dynamic Sitting Balance Dynamic Sitting - Balance Support: No upper extremity supported;Feet unsupported Static Standing Balance Static Standing - Balance Support: No upper extremity supported;During functional activity Static Standing - Level of Assistance: 7: Independent Dynamic Standing Balance Dynamic Standing - Balance Support: No upper extremity supported Dynamic Standing - Level of Assistance: 7: Independent High Level Balance High Level Balance Activites: Direction changes;Turns;Sudden stops;Head turns High Level Balance Comments: Performed without difficulty.  End of Session PT - End of Session Activity Tolerance: Patient tolerated treatment well Patient left: in bed;with call bell/phone within reach;with family/visitor present Nurse Communication: Mobility status  GP Functional  Assessment Tool Used: clinical judgement Functional Limitation: Mobility: Walking and moving around Mobility: Walking  and Moving Around Current Status [phone]): 0 percent impaired, limited or restricted Mobility: Walking and Moving Around Goal Status [phone]): 0 percent impaired, limited or restricted Mobility: Walking and Moving Around Discharge Status [phone]): 0 percent impaired, limited or restricted   [facility] 10/23/2013, 9:31 AM  [facility] PT [phone]

## 2013-10-23 NOTE — Progress Notes (Signed)
D/c orders received;IV removed with gauze on, pt remains in stable condition, pt meds and instructions reviewed and given to pt; pt d/c to home 

## 2013-11-09 ENCOUNTER — Ambulatory Visit (INDEPENDENT_AMBULATORY_CARE_PROVIDER_SITE_OTHER): Payer: Medicare Other | Admitting: Neurology

## 2013-11-09 ENCOUNTER — Encounter: Payer: Self-pay | Admitting: Neurology

## 2013-11-09 VITALS — BP 127/74 | HR 70 | Temp 97.3°F | Ht <= 58 in | Wt 175.0 lb

## 2013-11-09 DIAGNOSIS — G454 Transient global amnesia: Secondary | ICD-10-CM | POA: Insufficient documentation

## 2013-11-09 NOTE — Patient Instructions (Signed)
Continue Plavix for stroke prevention and strict control of hypertension with blood pressure goal below 130/90. No further workup is necessary for her transient global amnesia episode. Return for followup in 3 months with Cheryl Skeens, NP or call earlier if necessary

## 2013-11-09 NOTE — Progress Notes (Signed)
[facility] [address] [place]. [place] [address] [phone]       OFFICE CONSULT NOTE  Cheryl. Cheryl Wood Date of Birth:  July 22, 1936 Medical Record Number:  [id number]   Referring MD:   Windle Guard  Reason for Referral:  Confusion and memory loss HPI: Cheryl Wood is a 77 year old pleasant Caucasian lady who developed an episode of sudden onset of confusion and memory loss on 10/22/13. She was in church on the day of admission & in acquired and subsequently did some things which she was not aware of. She was brought to the emergency room by EMS is questionable I think her confused and did not know her own name and did not recognize family members. The patient's daughter felt that this episode was similar to a TIA she had 2 years ago. The patient's blood pressure was found to be elevated at 212/96 and basic labs were unremarkable except for progression of 3.2. She was admitted for possible TIA. MRI scan of the brain showed no evidence of acute stroke. Remote age lacunar infarcts are noted in the left cerebellum and left greater than right basal ganglia and left subcortical white matter. MRA of the brain showed mild distal small vessel disease. Transthoracic echo showed normal ejection fraction with grade 1 diastolic dysfunction. Carotid Doppler showed no significant expressive stenosis. Patient was seen in neurological consultation by Dr. Thad Ranger. EEG was obtained which showed no evidence of epileptiform activity. Patient was discharged home and she states that her memory loss and confusion episode lasted only 4-6 hours. She was able to remember events quite clearly to her singing in church and did not remember anything after that for 4-6 hours but has not had any problems remembering events since the hospital. She denied any focal symptoms in the form of headaches double vision blurred vision gait or balance problems during this episode. She has a remote history of a left brain  stroke in 2007 and had some initial right-sided weakness which she recovered completely from. She takes Plavix daily and states her blood pressure and cholesterol have all been under good control. Her blood pressure today in office is 127/74.  ROS:   14 system review of systems is positive for leg swelling, ringing in the ears, memory loss, confusion, flushing, cramps and restless legs PMH:  Past Medical History  Diagnosis Date  . Bleeding of blood vessel     ext. genitalia area  . Stroke   . Hypertension   . Hyperlipidemia   . Chronic kidney disease   . Memory loss     Social History:  History   Social History  . Marital Status: Divorced    Spouse Name: N/A    Number of Children: 2  . Years of Education: college   Occupational History  .     Social History Main Topics  . Smoking status: Former Smoker -- 40 years    Types: Cigarettes    Quit date: 11/05/1971  . Smokeless tobacco: Never Used  . Alcohol Use: No  . Drug Use: No  . Sexual Activity: No   Other Topics Concern  . Not on file   Social History Narrative  . No narrative on file    Medications:   Current Outpatient Prescriptions on File Prior to Visit  Medication Sig Dispense Refill  . clonazePAM (KLONOPIN) 0.5 MG tablet Take 0.25 mg by mouth daily as needed (sleep).       . clopidogrel (PLAVIX) 75 MG tablet  Take 37.5 mg by mouth every other day.       . Dorzolamide HCl-Timolol Mal (COSOPT OP) Place 1 drop into the left eye daily. Cosopt-Plus 0.5%      . famciclovir (FAMVIR) 500 MG tablet Take 500 mg by mouth 3 (three) times daily.        . famotidine (PEPCID) 20 MG tablet Take 20 mg by mouth 2 (two) times daily.       . furosemide (LASIX) 80 MG tablet Take 80 mg by mouth daily.        Bertram Gala Glycol-Propyl Glycol (SYSTANE OP) Place 1 drop into the right eye 2 (two) times daily.      . pravastatin (PRAVACHOL) 40 MG tablet Take 40 mg by mouth every evening.       . prednisoLONE acetate (PRED FORTE) 1 %  ophthalmic suspension Place 1 drop into the left eye 2 (two) times daily.      . traMADol (ULTRAM) 50 MG tablet Take 50 mg by mouth 2 (two) times daily as needed for pain. Maximum dose= 8 tablets per day      . Vitamin D, Ergocalciferol, (DRISDOL) 50000 UNITS CAPS Take 50,000 Units by mouth every 14 (fourteen) days.        No current facility-administered medications on file prior to visit.    Allergies:   Allergies  Allergen Reactions  . Tetanus Toxoids     Physical Exam General: well developed, well nourished elderly Caucasian aldy, seated, in no evident distress Head: head normocephalic and atraumatic. Orohparynx benign Neck: supple with no carotid or supraclavicular bruits Cardiovascular: regular rate and rhythm, no murmurs Musculoskeletal: no deformity Skin:  no rash/petichiae Vascular:  Normal pulses all extremities Filed Vitals:   11/09/13 1320  BP: 127/74  Pulse: 70  Temp: 97.3 F (36.3 C)    Neurologic Exam Mental Status: Awake and fully alert. Oriented to place and time. Recent and remote memory intact. Attention span, concentration and fund of knowledge appropriate. Mood and affect appropriate. Mini-Mental status exam scored 26/30 with deficits in spelling world backwards and 1 deficit in recall. Animal fluency test score 11 which is normal. Clock 4/4 which was also normal. Geriatric depression scale 3 which is not suggestive of depression. Cranial Nerves: Fundoscopic exam reveals sharp disc margins. Pupils equal, briskly reactive to light. Extraocular movements full without nystagmus. Visual fields full to confrontation. Hearing intact. Facial sensation intact. Face, tongue, palate moves normally and symmetrically.  Motor: Normal bulk and tone. Normal strength in all tested extremity muscles. Sensory.: intact to touch and pinprick and vibratory sensation.  Coordination: Rapid alternating movements normal in all extremities. Finger-to-nose and heel-to-shin performed  accurately bilaterally. Gait and Station: Arises from chair without difficulty. Stance is normal. Gait demonstrates normal stride length and balance . Able to heel, toe and tandem walk without difficulty.  Reflexes: 1+ and symmetric. Toes downgoing.   NIHSS  0 Modified Rankin  1   ASSESSMENT: 72 year Caucasian lady with transient episode of confusion and short-term memory difficulties on 10/22/13 with negative imaging studies likely transient global amnesia episode. Remote history of left brain stroke in 2007 without significant residual deficits. Vascular risk factors of hypertension and hyperlipidemia.    PLAN: I had a long discussion with the patient regarding her episode of confusion and memory loss, discussed the results of evaluation in the hospital, transient global amnesia, prognosis and answered questions. Continue Plavix for stroke prevention and strict control of hypertension with blood pressure goal below  130/90. No further workup is necessary for her transient global amnesia episode. Return for followup in 3 months with Enid Skeens, NP or call earlier if necessary

## 2014-01-18 ENCOUNTER — Encounter (INDEPENDENT_AMBULATORY_CARE_PROVIDER_SITE_OTHER): Payer: Self-pay

## 2014-01-18 ENCOUNTER — Ambulatory Visit (INDEPENDENT_AMBULATORY_CARE_PROVIDER_SITE_OTHER): Payer: Medicare Other | Admitting: Nurse Practitioner

## 2014-01-18 ENCOUNTER — Encounter: Payer: Self-pay | Admitting: Nurse Practitioner

## 2014-01-18 VITALS — BP 136/78 | HR 81 | Ht 62.5 in | Wt 169.5 lb

## 2014-01-18 DIAGNOSIS — Z8673 Personal history of transient ischemic attack (TIA), and cerebral infarction without residual deficits: Secondary | ICD-10-CM

## 2014-01-18 DIAGNOSIS — G454 Transient global amnesia: Secondary | ICD-10-CM

## 2014-01-18 NOTE — Progress Notes (Signed)
PATIENT: Cheryl Wood DOB: 1936/02/11   REASON FOR VISIT: follow up HISTORY FROM: patient  HISTORY OF PRESENT ILLNESS: 11/09/13 (PS): Cheryl Wood is a 78 year old pleasant Caucasian lady who developed an episode of sudden onset of confusion and memory loss on 10/22/13. She was in church on the day of admission & in acquired and subsequently did some things which she was not aware of. She was brought to the emergency room by EMS confused and did not know her own name and did not recognize family members. The patient's daughter felt that this episode was similar to a TIA she had 2 years ago. The patient's blood pressure was found to be elevated at 212/96 and basic labs were unremarkable except for progression of 3.2. She was admitted for possible TIA. MRI scan of the brain showed no evidence of acute stroke. Remote age lacunar infarcts are noted in the left cerebellum and left greater than right basal ganglia and left subcortical white matter. MRA of the brain showed mild distal small vessel disease. Transthoracic echo showed normal ejection fraction with grade 1 diastolic dysfunction. Carotid Doppler showed no significant expressive stenosis. Patient was seen in neurological consultation by Dr. Doy Mince. EEG was obtained which was negative. Patient was discharged home and she states that her memory loss and confusion episode lasted only 4-6 hours. She was able to remember events quite clearly to her singing in church and did not remember anything after that for 4-6 hours but has not had any problems remembering events since the hospital. She denied any focal symptoms in the form of headaches double vision blurred vision gait or balance problems during this episode. She has a remote history of a left brain stroke in 2007 and had some initial right-sided weakness which she recovered completely from. She takes Plavix daily and states her blood pressure and cholesterol have all been under good control. Her  blood pressure today in office is 127/74.   UPDATE 01/18/14 (LL):  Patient comes to office for revisit.  She has had no new neurological complaints or memory lapses.  She takes Plavix daily and states her blood pressure and cholesterol have all been under good control.  BP in office is 136/78. Doing well.  ROS:  14 system review of systems is positive for back pain and restless legs, insomnia  ALLERGIES: Allergies  Allergen Reactions  . Tetanus Toxoids     HOME MEDICATIONS: Outpatient Prescriptions Prior to Visit  Medication Sig Dispense Refill  . amLODipine (NORVASC) 10 MG tablet Take 10 mg by mouth daily.      . clonazePAM (KLONOPIN) 0.5 MG tablet Take 0.25 mg by mouth daily as needed (sleep).       . clopidogrel (PLAVIX) 75 MG tablet Take 37.5 mg by mouth every other day.       . Dorzolamide HCl-Timolol Mal (COSOPT OP) Place 1 drop into the left eye daily. Cosopt-Plus 0.5%      . famciclovir (FAMVIR) 500 MG tablet Take 500 mg by mouth 3 (three) times daily.        . famotidine (PEPCID) 20 MG tablet Take 20 mg by mouth 2 (two) times daily.       . furosemide (LASIX) 80 MG tablet Take 80 mg by mouth daily.        Marland Kitchen KLOR-CON M20 20 MEQ tablet Take 20 tablets by mouth daily.      Vladimir Faster Glycol-Propyl Glycol (SYSTANE OP) Place 1 drop into the right eye 2 (  two) times daily.      . pravastatin (PRAVACHOL) 40 MG tablet Take 40 mg by mouth every evening.       . prednisoLONE acetate (PRED FORTE) 1 % ophthalmic suspension Place 1 drop into the left eye 2 (two) times daily.      . traMADol (ULTRAM) 50 MG tablet Take 50 mg by mouth 2 (two) times daily as needed for pain. Maximum dose= 8 tablets per day      . Vitamin D, Ergocalciferol, (DRISDOL) 50000 UNITS CAPS Take 50,000 Units by mouth every 14 (fourteen) days.        No facility-administered medications prior to visit.    PAST MEDICAL HISTORY: Past Medical History  Diagnosis Date  . Bleeding of blood vessel     ext. genitalia  area  . Stroke   . Hypertension   . Hyperlipidemia   . Chronic kidney disease   . Memory loss     PAST SURGICAL HISTORY: Past Surgical History  Procedure Laterality Date  . Abdominal hysterectomy    . Gum transplant      FAMILY HISTORY: Family History  Problem Relation Age of Onset  . Cancer Mother   . Heart disease Father   . Cancer Sister     SOCIAL HISTORY: History   Social History  . Marital Status: Divorced    Spouse Name: N/A    Number of Children: 2  . Years of Education: college   Occupational History  .     Social History Main Topics  . Smoking status: Former Smoker -- 40 years    Types: Cigarettes    Quit date: 11/05/1971  . Smokeless tobacco: Never Used  . Alcohol Use: No  . Drug Use: No  . Sexual Activity: No   Other Topics Concern  . Not on file   Social History Narrative   Patient lives at home alone.   Caffeine Use: 2-3 cups daily     PHYSICAL EXAM  Filed Vitals:   01/18/14 1349  BP: 136/78  Pulse: 81  Height: 5' 2.5" (1.588 m)  Weight: 169 lb 8 oz (76.885 kg)   Body mass index is 30.49 kg/(m^2).  Physical Exam  General: well developed, well-nourished elderly Caucasian lady, seated, in no evident distress  Head: head normocephalic and atraumatic. Orohparynx benign  Neck: supple with no carotid or supraclavicular bruits  Cardiovascular: regular rate and rhythm, no murmurs  Musculoskeletal: no deformity  Skin: no rash/petichiae  Vascular: Normal pulses all extremities   Neurologic Exam  Mental Status: Awake and fully alert. Oriented to place and time. Recent and remote memory intact. Attention span, concentration and fund of knowledge appropriate. Mood and affect appropriate. Mini-Mental status exam scored 26/30 with deficits in spelling world backwards.  (same as last visit).  Animal fluency test score 17 which is normal. Clock 4/4 which was also normal. Geriatric depression scale 1 which is not suggestive of depression.    Cranial Nerves: Fundoscopic exam reveals sharp disc margins. Pupils equal, briskly reactive to light. Extraocular movements full without nystagmus. Visual fields full to confrontation. Hearing intact. Facial sensation intact. Face, tongue, palate moves normally and symmetrically.  Motor: Normal bulk and tone. Normal strength in all tested extremity muscles.  Sensory.: intact to touch and pinprick and vibratory sensation.  Coordination: Rapid alternating movements normal in all extremities. Finger-to-nose and heel-to-shin performed accurately bilaterally.  Gait and Station: Arises from chair without difficulty. Stance is normal. Gait demonstrates normal stride length and balance .  Able to heel, toe and tandem walk without difficulty.  Reflexes: 1+ and symmetric. Toes downgoing.   DIAGNOSTIC DATA (LABS, IMAGING, TESTING) - I reviewed patient records, labs, notes, testing and imaging myself where available.  Lab Results  Component Value Date   WBC 5.0 10/22/2013   HGB 14.7 10/22/2013   HCT 42.0 10/22/2013   MCV 90.5 10/22/2013   PLT 163 10/22/2013      Component Value Date/Time   NA 142 10/22/2013 1300   K 3.2* 10/22/2013 1300   CL 103 10/22/2013 1300   CO2 28 10/22/2013 1300   GLUCOSE 98 10/22/2013 1300   BUN 12 10/22/2013 1300   CREATININE 0.99 10/22/2013 1300   CALCIUM 9.4 10/22/2013 1300   PROT 7.3 10/22/2013 1300   ALBUMIN 4.3 10/22/2013 1300   AST 24 10/22/2013 1300   ALT 18 10/22/2013 1300   ALKPHOS 91 10/22/2013 1300   BILITOT 0.9 10/22/2013 1300   GFRNONAA 54* 10/22/2013 1300   GFRAA 63* 10/22/2013 1300   Lab Results  Component Value Date   CHOL 168 10/23/2013   HDL 44 10/23/2013   LDLCALC 100* 10/23/2013   TRIG 120 10/23/2013   CHOLHDL 3.8 10/23/2013   Lab Results  Component Value Date   HGBA1C 6.3* 10/23/2013    ASSESSMENT AND PLAN 42 year Caucasian lady with transient episode of confusion and short-term memory difficulties on 10/22/13 with negative  imaging studies likely transient global amnesia episode. Remote history of left brain stroke in 2007 without significant residual deficits. Vascular risk factors of hypertension and hyperlipidemia.   PLAN:  I had a long discussion with the patient regarding her episode of confusion and memory loss, discussed the results of evaluation in the hospital, transient global amnesia, prognosis and answered questions.  Discussed exercises she can do to improve her balance and also wrist strength. Discussed reducing caffeine intake in the evening or switching to decaf to improve her sleep.    Continue Plavix for stroke prevention and strict control of hypertension with blood pressure goal below 130/90. No further workup is necessary for her transient global amnesia episode.  Return for followup in 6 months with NP or call earlier if necessary.  Philmore Pali, MSN, NP-C 01/18/2014, 1:57 PM [facility] [address], [place], [place] [address] [phone]  Note: This document was prepared with digital dictation and possible smart phrase technology. Any transcriptional errors that result from this process are unintentional.

## 2014-01-18 NOTE — Patient Instructions (Signed)
Continue Plavix for stroke prevention and strict control of hypertension with blood pressure goal below 130/90. No further workup is necessary for her transient global amnesia episode.  Return for followup in 6 months with NP or call earlier if necessary.

## 2014-02-12 ENCOUNTER — Ambulatory Visit: Payer: BC Managed Care – PPO | Admitting: Nurse Practitioner

## 2014-05-13 HISTORY — PX: CATARACT EXTRACTION: SUR2

## 2014-07-18 ENCOUNTER — Ambulatory Visit (INDEPENDENT_AMBULATORY_CARE_PROVIDER_SITE_OTHER): Payer: Medicare Other | Admitting: Nurse Practitioner

## 2014-07-18 ENCOUNTER — Encounter: Payer: Self-pay | Admitting: Nurse Practitioner

## 2014-07-18 VITALS — BP 144/87 | HR 77 | Temp 97.9°F | Ht 62.0 in | Wt 170.0 lb

## 2014-07-18 DIAGNOSIS — G3184 Mild cognitive impairment, so stated: Secondary | ICD-10-CM

## 2014-07-18 DIAGNOSIS — Z8673 Personal history of transient ischemic attack (TIA), and cerebral infarction without residual deficits: Secondary | ICD-10-CM

## 2014-07-18 NOTE — Progress Notes (Signed)
PATIENT: Cheryl Wood DOB: 1936/01/19  REASON FOR VISIT: routine follow up for stroke, [facility] HISTORY FROM: patient  HISTORY OF PRESENT ILLNESS: 11/09/13 (PS): [name] is a 78 year old pleasant Caucasian lady who developed an episode of sudden onset of confusion and memory loss on 10/22/13. She was in church on the day of admission & in acquired and subsequently did some things which she was not aware of. She was brought to the emergency room by EMS confused and did not know her own name and did not recognize family members. The patient's daughter felt that this episode was similar to a TIA she had 2 years ago. The patient's blood pressure was found to be elevated at 212/96 and basic labs were unremarkable except for progression of 3.2. She was admitted for possible TIA. MRI scan of the brain showed no evidence of acute stroke. Remote age lacunar infarcts are noted in the left cerebellum and left greater than right basal ganglia and left subcortical white matter. MRA of the brain showed mild distal small vessel disease. Transthoracic echo showed normal ejection fraction with grade 1 diastolic dysfunction. Carotid Doppler showed no significant expressive stenosis. Patient was seen in neurological consultation by Dr. Doy Mince. EEG was obtained which was negative. Patient was discharged home and she states that her memory loss and confusion episode lasted only 4-6 hours. She was able to remember events quite clearly to her singing in church and did not remember anything after that for 4-6 hours but has not had any problems remembering events since the hospital. She denied any focal symptoms in the form of headaches double vision blurred vision gait or balance problems during this episode. She has a remote history of a left brain stroke in 2007 and had some initial right-sided weakness which she recovered completely from. She takes Plavix daily and states her blood pressure and cholesterol have all been under  good control. Her blood pressure today in office is 127/74.   UPDATE 01/18/14 (LL): Patient comes to office for revisit. She has had no new neurological complaints or memory lapses. She takes Plavix daily and states her blood pressure and cholesterol have all been under good control. BP in office is 136/78. Doing well.   UPDATE 07/18/14 (LL): Ms. Sieg returns to the office for 6 month stroke followup.  She has had no new neurological complaints or memory lapses. Blood pressure is well controlled, it is 144/87 in the office today.  She is tolerating Plavix well with no signs of significant bleeding or bruising. Has some worsening of balance, seems to veer to one side when walking, but not so bad that she cannot still wear her high heels to church. Memory stable.  ROS:  14 system review of systems is positive for back pain and restless legs, frequent waking, daytime sleepiness, hearing loss, ringing in ears, heat intolerance, bladder incontinence, memory loss, back pain, muscle cramps, leg swelling, murmur.  ALLERGIES: Allergies  Allergen Reactions  . Tetanus Toxoids     HOME MEDICATIONS: Outpatient Prescriptions Prior to Visit  Medication Sig Dispense Refill  . clonazePAM (KLONOPIN) 0.5 MG tablet Take 0.25 mg by mouth daily as needed (sleep).       . clopidogrel (PLAVIX) 75 MG tablet Take 37.5 mg by mouth every other day.       . Dorzolamide HCl-Timolol Mal (COSOPT OP) Place 1 drop into the left eye daily. Cosopt-Plus 0.5%      . famciclovir (FAMVIR) 500 MG tablet Take 500  mg by mouth 3 (three) times daily.        . famotidine (PEPCID) 20 MG tablet Take 20 mg by mouth 2 (two) times daily.       . furosemide (LASIX) 80 MG tablet Take 80 mg by mouth daily.        Marland Kitchen KLOR-CON M20 20 MEQ tablet Take 20 tablets by mouth daily.      Vladimir Faster Glycol-Propyl Glycol (SYSTANE OP) Place 1 drop into the right eye 2 (two) times daily.      . pravastatin (PRAVACHOL) 40 MG tablet Take 40 mg by mouth every  evening.       . prednisoLONE acetate (PRED FORTE) 1 % ophthalmic suspension Place 1 drop into the left eye 2 (two) times daily.      . traMADol (ULTRAM) 50 MG tablet Take 50 mg by mouth 2 (two) times daily as needed for pain. Maximum dose= 8 tablets per day      . Vitamin D, Ergocalciferol, (DRISDOL) 50000 UNITS CAPS Take 50,000 Units by mouth every 14 (fourteen) days.       Marland Kitchen amLODipine (NORVASC) 10 MG tablet Take 10 mg by mouth daily.       No facility-administered medications prior to visit.    PHYSICAL EXAM Filed Vitals:   07/18/14 1356  BP: 144/87  Pulse: 77  Temp: 97.9 F (36.6 C)  TempSrc: Oral  Height: 5\' 2"  (1.575 m)  Weight: 170 lb (77.111 kg)   Body mass index is 31.09 kg/(m^2).  MMSE - Mini Mental State Exam 07/18/2014  Orientation to time 5  Orientation to Place 5  Registration 3  Attention/ Calculation 2  Recall 3  Language- name 2 objects 2  Language- repeat 1  Language- follow 3 step command 3  Language- read & follow direction 1  Write a sentence 1  Copy design 0  Total score 26   Physical Exam  General: well developed, well-nourished elderly Caucasian lady, seated, in no evident distress  Head: head normocephalic and atraumatic. Orohparynx benign  Neck: supple with no carotid or supraclavicular bruits  Cardiovascular: regular rate and rhythm, 2/6 systolic murmur  Musculoskeletal: no deformity  Skin: no rash/petichiae  Vascular: Normal pulses all extremities   Neurologic Exam  Mental Status: Awake and fully alert. Oriented to place and time. Recent and remote memory intact. Attention span, concentration and fund of knowledge appropriate. Mood and affect appropriate. Mini-Mental status exam scored 26/30. Animal fluency test score 13 which is normal. Clock 4/4 which was also normal. Geriatric depression scale 2 which is not suggestive of depression.  Cranial Nerves: Fundoscopic exam not done. Pupils equal, briskly reactive to light. Extraocular movements  full without nystagmus. Visual fields full to confrontation. Hearing intact. Facial sensation intact. Face, tongue, palate moves normally and symmetrically.  Motor: Normal bulk and tone. Normal strength in all tested extremity muscles.  Sensory: intact to touch and pinprick and vibratory sensation.  Coordination: Rapid alternating movements normal in all extremities. Finger-to-nose and heel-to-shin performed accurately bilaterally.  Gait and Station: Arises from chair without difficulty. Stance is normal. Gait demonstrates normal stride length and balance . Able to heel, toe and tandem walk without difficulty.  Reflexes: 1+ and symmetric. Toes downgoing.   ASSESSMENT: 78 y.o. Caucasian lady with transient episode of confusion and short-term memory difficulties on 10/22/13 with negative imaging studies likely transient global amnesia episode. Remote history of left brain stroke in 2007 without significant residual deficits. Vascular risk factors of hypertension and  hyperlipidemia.   PLAN:  I had a long discussion with the patient regarding her episode of confusion and memory loss, and answered questions. Handout was given with exercises to improve balance.  Continue Plavix for stroke prevention and strict control of hypertension with blood pressure goal below 140/90. Return for followup in 6 months Dr. Leonie Man or call earlier if necessary.  Rudi Rummage Shalamar Plourde, MSN, FNP-BC, A/GNP-C 07/18/2014, 9:37 PM [facility] [address], [place], [place] [address] [phone]  Note: This document was prepared with digital dictation and possible smart phrase technology. Any transcriptional errors that result from this process are unintentional.

## 2014-07-18 NOTE — Patient Instructions (Signed)
Continue Plavix for stroke prevention and strict control of hypertension with blood pressure goal below 140/90. Return for followup in 6 months Dr. Leonie Wood or call earlier if necessary.  Stroke Prevention Some medical conditions and behaviors are associated with an increased chance of having a stroke. You may prevent a stroke by making healthy choices and managing medical conditions. HOW CAN I REDUCE MY RISK OF HAVING A STROKE?   Stay physically active. Get at least 30 minutes of activity on most or all days.  Do not smoke. It may also be helpful to avoid exposure to secondhand smoke.  Limit alcohol use. Moderate alcohol use is considered to be:  No more than 2 drinks per day for men.  No more than 1 drink per day for nonpregnant women.  Eat healthy foods. This involves  Eating 5 or more servings of fruits and vegetables a day.  Following a diet that addresses high blood pressure (hypertension), high cholesterol, diabetes, or obesity.  Manage your cholesterol levels.  A diet low in saturated fat, trans fat, and cholesterol and high in fiber may control cholesterol levels.  Take any prescribed medicines to control cholesterol as directed by your health care provider.  Manage your diabetes.  A controlled-carbohydrate, controlled-sugar diet is recommended to manage diabetes.  Take any prescribed medicines to control diabetes as directed by your health care provider.  Control your hypertension.  A low-salt (sodium), low-saturated fat, low-trans fat, and low-cholesterol diet is recommended to manage hypertension.  Take any prescribed medicines to control hypertension as directed by your health care provider.  Maintain a healthy weight.  A reduced-calorie, low-sodium, low-saturated fat, low-trans fat, low-cholesterol diet is recommended to manage weight.  Stop drug abuse.  Avoid taking birth control pills.  Talk to your health care provider about the risks of taking birth  control pills if you are over 20 years old, smoke, get migraines, or have ever had a blood clot.  Get evaluated for sleep disorders (sleep apnea).  Talk to your health care provider about getting a sleep evaluation if you snore a lot or have excessive sleepiness.  Take medicines as directed by your health care provider.  For some people, aspirin or blood thinners (anticoagulants) are helpful in reducing the risk of forming abnormal blood clots that can lead to stroke. If you have the irregular heart rhythm of atrial fibrillation, you should be on a blood thinner unless there is a good reason you cannot take them.  Understand all your medicine instructions.  Make sure that other other conditions (such as anemia or atherosclerosis) are addressed. SEEK IMMEDIATE MEDICAL CARE IF:   You have sudden weakness or numbness of the face, arm, or leg, especially on one side of the body.  Your face or eyelid droops to one side.  You have sudden confusion.  You have trouble speaking (aphasia) or understanding.  You have sudden trouble seeing in one or both eyes.  You have sudden trouble walking.  You have dizziness.  You have a loss of balance or coordination.  You have a sudden, severe headache with no known cause.  You have new chest pain or an irregular heartbeat. Any of these symptoms may represent a serious problem that is an emergency. Do not wait to see if the symptoms will go away. Get medical help at once. Call your local emergency services  (911 in [place] Do not drive yourself to the hospital. Document Released: 01/21/2005 Document Revised: 10/04/2013 Document Reviewed: 06/16/2013 [employer] Patient Information  2015 [employer]. This information is not intended to replace advice given to you by your health care provider. Make sure you discuss any questions you have with your health care provider.  

## 2014-07-24 NOTE — Progress Notes (Signed)
I agree with the above plan 

## 2014-07-31 ENCOUNTER — Other Ambulatory Visit: Payer: Self-pay

## 2014-07-31 DIAGNOSIS — Z1231 Encounter for screening mammogram for malignant neoplasm of breast: Secondary | ICD-10-CM

## 2014-09-10 ENCOUNTER — Encounter (INDEPENDENT_AMBULATORY_CARE_PROVIDER_SITE_OTHER): Payer: Self-pay

## 2014-09-10 ENCOUNTER — Ambulatory Visit
Admission: RE | Admit: 2014-09-10 | Discharge: 2014-09-10 | Disposition: A | Discharge status: Home / Self Care | Payer: Medicare Other | Source: Ambulatory Visit

## 2014-09-10 DIAGNOSIS — Z1231 Encounter for screening mammogram for malignant neoplasm of breast: Secondary | ICD-10-CM

## 2015-01-28 ENCOUNTER — Encounter: Payer: Self-pay | Admitting: Neurology

## 2015-01-28 ENCOUNTER — Ambulatory Visit (INDEPENDENT_AMBULATORY_CARE_PROVIDER_SITE_OTHER): Payer: BLUE CROSS/BLUE SHIELD | Admitting: Neurology

## 2015-01-28 VITALS — BP 103/75 | HR 93 | Ht 62.0 in | Wt 172.4 lb

## 2015-01-28 DIAGNOSIS — M542 Cervicalgia: Secondary | ICD-10-CM | POA: Diagnosis not present

## 2015-01-28 NOTE — Progress Notes (Signed)
[name]: Cheryl [name] DOB: [birth year]  REASON FOR VISIT: routine follow up for stroke, [facility] HISTORY FROM: [name]  HISTORY OF PRESENT ILLNESS: 11/09/13 (PS): Cheryl [name] is a 79 year old pleasant Caucasian lady who developed an episode of sudden onset of confusion and memory loss on 10/22/13. She was in church on Cheryl day of admission & in acquired and subsequently did some things which she was not aware of. She was brought to [facility] emergency room by EMS confused and did not know her own name and did not recognize family members. Cheryl [name]'s daughter felt that this episode was similar to a TIA she had 2 years ago. Cheryl [name]'s blood pressure was found to be elevated at 212/96 and basic labs were unremarkable except for progression of 3.2. She was admitted for possible TIA. MRI scan of Cheryl brain showed no evidence of acute stroke. Remote age lacunar infarcts are noted in Cheryl left cerebellum and left greater than right basal ganglia and left subcortical white matter. MRA of Cheryl brain showed mild distal small vessel disease. Transthoracic echo showed normal ejection fraction with grade 1 diastolic dysfunction. Carotid Doppler showed no significant expressive stenosis. [name] was seen in neurological consultation by Dr. Doy Mince. EEG was obtained which was negative. [name] was discharged home and she states that her memory loss and confusion episode lasted only 4-6 hours. She was able to remember events quite clearly to her singing in church and did not remember anything after that for 4-6 hours but has not had any problems remembering events since Cheryl hospital. She denied any focal symptoms in Cheryl form of headaches double vision blurred vision gait or balance problems during this episode. She has a remote history of a left brain stroke in 2007 and had some initial right-sided weakness which she recovered completely from. She takes Plavix daily and states her blood pressure and cholesterol have all been under  good control. Her blood pressure today in office is 127/74.   UPDATE 01/18/14 (LL): [name] comes to office for revisit. She has had no new neurological complaints or memory lapses. She takes Plavix daily and states her blood pressure and cholesterol have all been under good control. BP in office is 136/78. Doing well.   UPDATE 07/18/14 (LL): Cheryl [name] returns to Cheryl office for 6 month stroke followup.  She has had no new neurological complaints or memory lapses. Blood pressure is well controlled, it is 144/87 in Cheryl office today.  She is tolerating Plavix well with no signs of significant bleeding or bruising. Has some worsening of balance, seems to veer to one side when walking, but not so bad that she cannot still wear her high heels to church. Memory stable. Update 01/28/2015 : She returns for follow-up after last visit 6 months ago. She states her memory difficulties are unchanged and not progressing. She has not had any further episodes of confusion, TIA or strokes. She is tolerating Plavix well with only minor bruising and no other side effects. She has new complaint of right-sided neck pain which seems to have developed since September last year when she had to go and live with her sister to help her take care of her sick husband. He unfortunately passed away in 01/19/24. Cheryl [name] also has noted that her younger sister has developed Alzheimer's. She is under significant stress from that. She has not been doing regular neck stretching exercises or taken any pain medications for her neck pain ROS:  14 system review of systems  is positive for neck pain and  Restriction of movements, memory loss,  Only and all other systems negative. ALLERGIES: Allergies  Allergen Reactions  . Tetanus Toxoids     HOME MEDICATIONS: Outpatient Prescriptions Prior to Visit  Medication Sig Dispense Refill  . clonazePAM (KLONOPIN) 0.5 MG tablet Take 0.25 mg by mouth daily as needed (sleep).     . clopidogrel  (PLAVIX) 75 MG tablet Take 37.5 mg by mouth every other day.     . Dorzolamide HCl-Timolol Mal (COSOPT OP) Place 1 drop into Cheryl left eye daily. Cosopt-Plus 0.5%    . famciclovir (FAMVIR) 500 MG tablet Take 500 mg by mouth 3 (three) times daily.      . famotidine (PEPCID) 20 MG tablet Take 20 mg by mouth 2 (two) times daily.     . furosemide (LASIX) 80 MG tablet Take 80 mg by mouth daily.      Marland Kitchen KLOR-CON M20 20 MEQ tablet Take 20 tablets by mouth daily.    Marland Kitchen losartan (COZAAR) 100 MG tablet Take 50 mg by mouth daily.    Vladimir Faster Glycol-Propyl Glycol (SYSTANE OP) Place 1 drop into Cheryl right eye 2 (two) times daily.    . pravastatin (PRAVACHOL) 40 MG tablet Take 40 mg by mouth every evening.     . prednisoLONE acetate (PRED FORTE) 1 % ophthalmic suspension Place 1 drop into Cheryl left eye 2 (two) times daily.    . traMADol (ULTRAM) 50 MG tablet Take 50 mg by mouth 2 (two) times daily as needed for pain. Maximum dose= 8 tablets per day    . Vitamin D, Ergocalciferol, (DRISDOL) 50000 UNITS CAPS Take 50,000 Units by mouth every 14 (fourteen) days.      No facility-administered medications prior to visit.    PHYSICAL EXAM Filed Vitals:   01/28/15 1418  BP: 103/75  Pulse: 93  Height: 5\' 2"  (1.575 m)  Weight: 172 lb 6.4 oz (78.2 kg)   Body mass index is 31.52 kg/(m^2).  MMSE - Mini Mental State Exam 01/28/2015 07/18/2014  Orientation to time 5 5  Orientation to Place 5 5  Registration 3 3  Attention/ Calculation 4 2  Recall 2 3  Language- name 2 objects 2 2  Language- repeat 1 1  Language- follow 3 step command 3 3  Language- read & follow direction 1 1  Write a sentence 1 1  Copy design 1 0  Total score 28 26   Physical Exam  General: well developed, well-nourished elderly Caucasian lady, seated, in no evident distress  Head: head normocephalic and atraumatic. Orohparynx benign  Neck: tenderness right trapezius with restriction of movements to right sidee with no carotid or  supraclavicular bruits  Cardiovascular: regular rate and rhythm, 2/6 systolic murmur  Musculoskeletal: no deformity  Skin: no rash/petichiae  Vascular: Normal pulses all extremities   Neurologic Exam  Mental Status: Awake and fully alert. Oriented to place and time. Recent and remote memory intact. Attention span, concentration and fund of knowledge appropriate. Mood and affect appropriate. Mini-Mental status exam scored 28/30. Animal fluency test score 14 which is normal. Clock 4/4 which was also normal. Geriatric depression scale 1 which is not suggestive of depression.  Cranial Nerves: Fundoscopic exam not done. Pupils equal, briskly reactive to light. Extraocular movements full without nystagmus. Visual fields full to confrontation. Hearing intact. Facial sensation intact. Face, tongue, palate moves normally and symmetrically.  Motor: Normal bulk and tone. Normal strength in all tested extremity muscles.  Sensory:  intact to touch and pinprick and vibratory sensation.  Coordination: Rapid alternating movements normal in all extremities. Finger-to-nose and heel-to-shin performed accurately bilaterally.  Gait and Station: Arises from chair without difficulty. Stance is normal. Gait demonstrates normal stride length and balance . Able to heel, toe and tandem walk without difficulty.  Reflexes: 1+ and symmetric. Toes downgoing.   ASSESSMENT: 79 y.o. Caucasian lady with transient episode of confusion and short-term memory difficulties on 10/22/13 with negative imaging studies likely transient global amnesia episode. Remote history of left brain stroke in 2007 without significant residual deficits. Vascular risk factors of hypertension and hyperlipidemia. New complaints of right sided neck pain likely musculoskeletal in origin  PLAN:  I had a long discussion with Cheryl [name] with regards to her remote history of transient global amnesia as well as mild cognitive impairment both of which appear quite  stable at Cheryl present time. She has new complaints of right-sided neck pain which appears to be muscular in nature. I recommend she doing neck stretching exercises on a daily basis as well as take a course of anti-inflammatory pain medications for a week and local heat application. If neck pain symptoms persist I have advised her to see her primary physician. Continue Plavix for stroke prevention and strict control of hypertension with blood pressure goal below 130/90. Return for follow-up in 6 months or call earlier if necessary      Antony Contras, MD  01/28/2015, 2:50 PM [facility] [address], [place], [place] [address] [phone]  Note: This document was prepared with digital dictation and possible smart phrase technology. Any transcriptional errors that result from this process are unintentional.

## 2015-01-28 NOTE — Patient Instructions (Signed)
I had a long discussion with the patient with regards to her remote history of transient global amnesia as well as mild cognitive impairment both of which appear quite stable at the present time. She has new complaints of right-sided neck pain which appears to be muscular in nature. I recommend she doing neck stretching exercises on a daily basis as well as take a course of anti-inflammatory pain medications for a week and local heat application. If neck pain symptoms persist I have advised her to see her primary physician. Continue Plavix for stroke prevention and strict control of hypertension with blood pressure goal below 130/90. Return for follow-up in 6 months or call earlier if necessary

## 2015-02-01 ENCOUNTER — Ambulatory Visit: Payer: Medicare Other | Admitting: Neurology

## 2015-07-29 ENCOUNTER — Ambulatory Visit (INDEPENDENT_AMBULATORY_CARE_PROVIDER_SITE_OTHER): Payer: Medicare Other | Admitting: Neurology

## 2015-07-29 ENCOUNTER — Encounter: Payer: Self-pay | Admitting: Neurology

## 2015-07-29 VITALS — BP 114/70 | HR 68 | Ht 62.0 in | Wt 174.6 lb

## 2015-07-29 DIAGNOSIS — I6529 Occlusion and stenosis of unspecified carotid artery: Secondary | ICD-10-CM

## 2015-07-29 NOTE — Progress Notes (Signed)
PATIENT: Cheryl Wood DOB: 1936/02/20  REASON FOR VISIT: routine follow up for stroke, [facility] HISTORY FROM: patient  HISTORY OF PRESENT ILLNESS: 11/09/13 (PS): Cheryl Wood is a 79 year old pleasant Caucasian lady who developed an episode of sudden onset of confusion and memory loss on 10/22/13. She was in church on the day of admission & in acquired and subsequently did some things which she was not aware of. She was brought to the emergency room by EMS confused and did not know her own name and did not recognize family members. The patient's daughter felt that this episode was similar to a TIA she had 2 years ago. The patient's blood pressure was found to be elevated at 212/96 and basic labs were unremarkable except for progression of 3.2. She was admitted for possible TIA. MRI scan of the brain showed no evidence of acute stroke. Remote age lacunar infarcts are noted in the left cerebellum and left greater than right basal ganglia and left subcortical white matter. MRA of the brain showed mild distal small vessel disease. Transthoracic echo showed normal ejection fraction with grade 1 diastolic dysfunction. Carotid Doppler showed no significant expressive stenosis. Patient was seen in neurological consultation by Dr. Doy Mince. EEG was obtained which was negative. Patient was discharged home and she states that her memory loss and confusion episode lasted only 4-6 hours. She was able to remember events quite clearly to her singing in church and did not remember anything after that for 4-6 hours but has not had any problems remembering events since the hospital. She denied any focal symptoms in the form of headaches double vision blurred vision gait or balance problems during this episode. She has a remote history of a left brain stroke in 2007 and had some initial right-sided weakness which she recovered completely from. She takes Plavix daily and states her blood pressure and cholesterol have all been under  good control. Her blood pressure today in office is 127/74.   UPDATE 01/18/14 (LL): Patient comes to office for revisit. She has had no new neurological complaints or memory lapses. She takes Plavix daily and states her blood pressure and cholesterol have all been under good control. BP in office is 136/78. Doing well.   UPDATE 07/18/14 (LL): Cheryl Wood returns to the office for 6 month stroke followup.  She has had no new neurological complaints or memory lapses. Blood pressure is well controlled, it is 144/87 in the office today.  She is tolerating Plavix well with no signs of significant bleeding or bruising. Has some worsening of balance, seems to veer to one side when walking, but not so bad that she cannot still wear her high heels to church. Memory stable. Update 01/28/2015 : She returns for follow-up after last visit 6 months ago. She states her memory difficulties are unchanged and not progressing. She has not had any further episodes of confusion, TIA or strokes. She is tolerating Plavix well with only minor bruising and no other side effects. She has new complaint of right-sided neck pain which seems to have developed since September last year when she had to go and live with her sister to help her take care of her sick husband. He unfortunately passed away in 01-12-24. The patient also has noted that her younger sister has developed Alzheimer's. She is under significant stress from that. She has not been doing regular neck stretching exercises or taken any pain medications for her neck pain Update 07/29/2015 : She returns for follow-up  after last visit 6 months ago. She continues to do well without recurrent stroke or TIA symptoms. She had cataract surgery in May on the right eye which went well. She states her blood pressure is doing great and it is 114/70 today. She is tolerating Plavix without significant bleeding and only minor bruising. She states she had her last lipid profile checked by family  physician in December 2015 and was fine. She has no new complaints today. ROS:  14 system review of systems is positive for  incontinence of bladder, restless legs only and all other systems negative. ALLERGIES: Allergies  Allergen Reactions  . Tetanus Toxoids     HOME MEDICATIONS: Outpatient Prescriptions Prior to Visit  Medication Sig Dispense Refill  . clonazePAM (KLONOPIN) 0.5 MG tablet Take 0.25 mg by mouth daily as needed (sleep).     . clopidogrel (PLAVIX) 75 MG tablet Take 37.5 mg by mouth every other day.     . Dorzolamide HCl-Timolol Mal (COSOPT OP) Place 1 drop into the left eye daily. Cosopt-Plus 0.5%    . famciclovir (FAMVIR) 500 MG tablet Take 500 mg by mouth 3 (three) times daily.      . famotidine (PEPCID) 20 MG tablet Take 20 mg by mouth 2 (two) times daily.     . furosemide (LASIX) 80 MG tablet Take 80 mg by mouth daily.      Marland Kitchen KLOR-CON M20 20 MEQ tablet Take 20 tablets by mouth daily.    Marland Kitchen losartan (COZAAR) 100 MG tablet Take 50 mg by mouth daily.    Vladimir Faster Glycol-Propyl Glycol (SYSTANE OP) Place 1 drop into the right eye 2 (two) times daily.    . pravastatin (PRAVACHOL) 40 MG tablet Take 40 mg by mouth every evening.     . prednisoLONE acetate (PRED FORTE) 1 % ophthalmic suspension Place 1 drop into the left eye 2 (two) times daily.    . traMADol (ULTRAM) 50 MG tablet Take 50 mg by mouth 2 (two) times daily as needed for pain. Maximum dose= 8 tablets per day    . Vitamin D, Ergocalciferol, (DRISDOL) 50000 UNITS CAPS Take 50,000 Units by mouth every 14 (fourteen) days.      No facility-administered medications prior to visit.    PHYSICAL EXAM Filed Vitals:   07/29/15 1017  BP: 114/70  Pulse: 68  Height: 5\' 2"  (1.575 m)  Weight: 174 lb 9.6 oz (79.198 kg)   Body mass index is 31.93 kg/(m^2).  MMSE - Mini Mental State Exam 01/28/2015 07/18/2014  Orientation to time 5 5  Orientation to Place 5 5  Registration 3 3  Attention/ Calculation 4 2  Recall 2 3    Language- name 2 objects 2 2  Language- repeat 1 1  Language- follow 3 step command 3 3  Language- read & follow direction 1 1  Write a sentence 1 1  Copy design 1 0  Total score 28 26   Physical Exam  General: well developed, well-nourished elderly Caucasian lady, seated, in no evident distress  Head: head normocephalic and atraumatic. Orohparynx benign  Neck: tenderness right trapezius with restriction of movements to right sidee with no carotid or supraclavicular bruits  Cardiovascular: regular rate and rhythm, 2/6 systolic murmur  Musculoskeletal: no deformity  Skin: no rash/petichiae  Vascular: Normal pulses all extremities   Neurologic Exam  Mental Status: Awake and fully alert. Oriented to place and time. Recent and remote memory intact. Attention span, concentration and fund of knowledge appropriate. Mood  and affect appropriate. Mini-Mental status exam not done Cranial Nerves: Fundoscopic exam not done. Pupils equal, briskly reactive to light. Extraocular movements full without nystagmus. Visual fields full to confrontation. Hearing intact. Facial sensation intact. Face, tongue, palate moves normally and symmetrically.  Motor: Normal bulk and tone. Normal strength in all tested extremity muscles.  Sensory: intact to touch and pinprick and vibratory sensation.  Coordination: Rapid alternating movements normal in all extremities. Finger-to-nose and heel-to-shin performed accurately bilaterally.  Gait and Station: Arises from chair without difficulty. Stance is normal. Gait demonstrates normal stride length and balance . Able to heel, toe and tandem walk without difficulty.  Reflexes: 1+ and symmetric. Toes downgoing.   ASSESSMENT: 79 y.o. Caucasian lady with transient episode of confusion and short-term memory difficulties on 10/22/13 with negative imaging studies likely transient global amnesia episode. Remote history of left brain stroke in 2007 without significant residual  deficits. Vascular risk factors of hypertension and hyperlipidemia. New complaints of right sided neck pain likely musculoskeletal in origin  PLAN:      I had a long d/w patient about her remote TIA stroke, risk for recurrent stroke/TIAs, personally independently reviewed imaging studies and stroke evaluation results and answered questions.Continue Plavix for secondary stroke prevention and maintain strict control of hypertension with blood pressure goal below 130/90, diabetes with hemoglobin A1c goal below 6.5% and lipids with LDL cholesterol goal below 100 mg/dL. I also advised the patient to eat a healthy diet with plenty of whole grains, cereals, fruits and vegetables, exercise regularly and maintain ideal body weight .check follow-up carotid ultrasound study. Since it has been several years since patient had a TIA and she is doing well from neurovascular standpoint I do not believe further routine follow-up appointment with me is necessary and she can be referred back in the future only as needed   Antony Contras, MD  07/29/2015, 2:04 PM [facility] [address], [place], [place] [address] [phone]  Note: This document was prepared with digital dictation and possible smart phrase technology. Any transcriptional errors that result from this process are unintentional.

## 2015-07-29 NOTE — Patient Instructions (Signed)
I had a long d/w patient about her remote TIA stroke, risk for recurrent stroke/TIAs, personally independently reviewed imaging studies and stroke evaluation results and answered questions.Continue Plavix for secondary stroke prevention and maintain strict control of hypertension with blood pressure goal below 130/90, diabetes with hemoglobin A1c goal below 6.5% and lipids with LDL cholesterol goal below 100 mg/dL. I also advised the patient to eat a healthy diet with plenty of whole grains, cereals, fruits and vegetables, exercise regularly and maintain ideal body weight .check follow-up carotid ultrasound study. Since it has been several years since patient had a TIA and she is doing well from neurovascular standpoint I do not believe further routine follow-up appointment with me is necessary and she can be referred back in the future only as needed

## 2015-09-04 ENCOUNTER — Ambulatory Visit (INDEPENDENT_AMBULATORY_CARE_PROVIDER_SITE_OTHER): Payer: Medicare Other

## 2015-09-04 DIAGNOSIS — I6529 Occlusion and stenosis of unspecified carotid artery: Secondary | ICD-10-CM

## 2015-09-24 ENCOUNTER — Telehealth: Payer: Self-pay | Admitting: Neurology

## 2015-09-24 NOTE — Telephone Encounter (Signed)
CUS done on 09/04/15 was normal and I will print another copy

## 2015-09-24 NOTE — Telephone Encounter (Signed)
Pt called and would like to know results of Carotid doppler. Please call and advise

## 2015-09-25 NOTE — Telephone Encounter (Signed)
Rn call patient to notify her that her carotid doppler was normal. Pt was happy with the results. Pt verbalized understanding of the results.

## 2015-10-07 ENCOUNTER — Other Ambulatory Visit: Payer: Self-pay

## 2015-10-07 DIAGNOSIS — Z1231 Encounter for screening mammogram for malignant neoplasm of breast: Secondary | ICD-10-CM

## 2015-10-31 ENCOUNTER — Ambulatory Visit: Payer: BC Managed Care – PPO

## 2015-11-28 ENCOUNTER — Ambulatory Visit: Payer: BC Managed Care – PPO

## 2015-12-02 ENCOUNTER — Ambulatory Visit
Admission: RE | Admit: 2015-12-02 | Discharge: 2015-12-02 | Disposition: A | Discharge status: Home / Self Care | Payer: Medicare Other | Source: Ambulatory Visit

## 2015-12-02 DIAGNOSIS — Z1231 Encounter for screening mammogram for malignant neoplasm of breast: Secondary | ICD-10-CM

## 2016-01-02 ENCOUNTER — Ambulatory Visit: Payer: BC Managed Care – PPO

## 2016-11-05 ENCOUNTER — Other Ambulatory Visit: Payer: Self-pay | Admitting: Family Medicine

## 2016-11-05 DIAGNOSIS — Z1231 Encounter for screening mammogram for malignant neoplasm of breast: Secondary | ICD-10-CM

## 2016-12-23 ENCOUNTER — Ambulatory Visit: Payer: Medicare Other

## 2017-01-14 ENCOUNTER — Ambulatory Visit: Payer: Medicare Other

## 2017-02-02 ENCOUNTER — Ambulatory Visit: Payer: Medicare Other

## 2017-02-24 ENCOUNTER — Ambulatory Visit
Admission: RE | Admit: 2017-02-24 | Discharge: 2017-02-24 | Disposition: A | Discharge status: Home / Self Care | Payer: Medicare Other | Source: Ambulatory Visit | Attending: Family Medicine | Admitting: Family Medicine

## 2017-02-24 DIAGNOSIS — Z1231 Encounter for screening mammogram for malignant neoplasm of breast: Secondary | ICD-10-CM

## 2017-04-26 DIAGNOSIS — H60312 Diffuse otitis externa, left ear: Secondary | ICD-10-CM | POA: Insufficient documentation

## 2017-04-28 LAB — CBC AND DIFFERENTIAL
HEMATOCRIT: 40 (ref 36–46)
Hemoglobin: 13.1 (ref 12.0–16.0)
WBC: 6.2

## 2018-02-16 ENCOUNTER — Other Ambulatory Visit: Payer: Self-pay | Admitting: Family Medicine

## 2018-02-16 DIAGNOSIS — Z1231 Encounter for screening mammogram for malignant neoplasm of breast: Secondary | ICD-10-CM

## 2018-03-09 ENCOUNTER — Ambulatory Visit: Payer: Medicare Other

## 2018-03-10 ENCOUNTER — Ambulatory Visit
Admission: RE | Admit: 2018-03-10 | Discharge: 2018-03-10 | Disposition: A | Discharge status: Home / Self Care | Payer: Medicare Other | Source: Ambulatory Visit | Attending: Family Medicine | Admitting: Family Medicine

## 2018-03-10 ENCOUNTER — Other Ambulatory Visit: Payer: Self-pay | Admitting: Family Medicine

## 2018-03-10 DIAGNOSIS — Z1231 Encounter for screening mammogram for malignant neoplasm of breast: Secondary | ICD-10-CM

## 2018-03-10 DIAGNOSIS — R0600 Dyspnea, unspecified: Secondary | ICD-10-CM

## 2018-03-30 ENCOUNTER — Ambulatory Visit (INDEPENDENT_AMBULATORY_CARE_PROVIDER_SITE_OTHER): Payer: Medicare Other | Admitting: Pulmonary Disease

## 2018-03-30 ENCOUNTER — Encounter: Payer: Self-pay | Admitting: Pulmonary Disease

## 2018-03-30 VITALS — BP 122/72 | HR 72 | Ht 61.0 in | Wt 165.0 lb

## 2018-03-30 DIAGNOSIS — R0600 Dyspnea, unspecified: Secondary | ICD-10-CM

## 2018-03-30 DIAGNOSIS — R0609 Other forms of dyspnea: Secondary | ICD-10-CM | POA: Diagnosis not present

## 2018-03-30 DIAGNOSIS — R0602 Shortness of breath: Secondary | ICD-10-CM | POA: Diagnosis not present

## 2018-03-30 NOTE — Progress Notes (Signed)
Subjective:    Patient ID: Cheryl Wood, female    DOB: 1936-08-06, 82 y.o.   MRN: [id number]  HPI  82 year old remote smoker presents for evaluation of shortness of breath for 6 weeks. She also reports associated intermittent wheezing that has been noticed by her coworker when she walks.  She describes insidious onset of symptoms, no preceding URI, no associated cough, orthopnea paroxysmal nocturnal dyspnea.  She finds it difficult to perform even routine activities such as making her bed without sitting down.  She uses a cart when she walks in the store.  She reports occasional hoarseness of voice She reports chronic back edema.  She takes 2 tablets of 80 mg of Lasix every morning.  She denies recent weight gain. There is no travel history of asthma.  She smoked half pack per day for about 20 years before she quit in her early 66s. She lives at home with her dog.  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  Echo 09/2013 showed moderate LVH and mild to moderate AR which she attributes to rheumatic heart disease.  Repeat echo was performed by PCP which is not available to me  Significant tests/ events reviewed  Spirometry shows ratio of 83, FEV1 of 101% and FVC of 89% Ambulatory saturation stayed at 97% she was visibly dyspneic after 2 laps     Past Medical History:  Diagnosis Date  . Bleeding of blood vessel    ext. genitalia area  . Chronic kidney disease   . Hyperlipidemia   . Hypertension   . Memory loss   . Stroke [facility])     Past Surgical History:  Procedure Laterality Date  . ABDOMINAL HYSTERECTOMY    . CATARACT EXTRACTION Right May 13, 2014  . gum transplant      Allergies  Allergen Reactions  . Tetanus Toxoids    Social History   Socioeconomic History  . Marital status: Divorced    Spouse name: Not on file  . Number of children: 2  . Years of education: college  . Highest education level: Not on file  Occupational  History    Employer: RETIRED  Social Needs  . Financial resource strain: Not on file  . Food insecurity:    Worry: Not on file    Inability: Not on file  . Transportation needs:    Medical: Not on file    Non-medical: Not on file  Tobacco Use  . Smoking status: Former Smoker    Years: 40.00    Types: Cigarettes    Last attempt to quit: 11/05/1971    Years since quitting: 46.4  . Smokeless tobacco: Never Used  Substance and Sexual Activity  . Alcohol use: No    Alcohol/week: 0.0 oz  . Drug use: No  . Sexual activity: Never  Lifestyle  . Physical activity:    Days per week: Not on file    Minutes per session: Not on file  . Stress: Not on file  Relationships  . Social connections:    Talks on phone: Not on file    Gets together: Not on file    Attends religious service: Not on file    Active member of club or organization: Not on file    Attends meetings of clubs or organizations: Not on file    Relationship status: Not on file  . Intimate partner violence:    Fear of current or ex partner: Not on  file    Emotionally abused: Not on file    Physically abused: Not on file    Forced sexual activity: Not on file  Other Topics Concern  . Not on file  Social History Narrative   Patient lives at home alone.   Caffeine Use: 2-3 cups daily      Review of Systems  HENT: Positive for trouble swallowing.   Eyes: Positive for redness and itching.  Respiratory: Positive for cough, shortness of breath and wheezing.   Cardiovascular: Positive for leg swelling.  Musculoskeletal: Positive for joint swelling.  Allergic/Immunologic: Negative.  Negative for environmental allergies, food allergies and immunocompromised state.  Hematological: Bruises/bleeds easily.       Objective:   Physical Exam  Gen. Pleasant, elderly,well-nourished, in no distress, normal affect ENT - no lesions, no post nasal drip Neck: No JVD, no thyromegaly, no carotid bruits Lungs: no use of accessory  muscles, no dullness to percussion, clear without rales or rhonchi  Cardiovascular: Rhythm regular, heart sounds  normal, no murmurs or gallops, 1+ peripheral edema Abdomen: soft and non-tender, no hepatosplenomegaly, BS normal. Musculoskeletal: No deformities, no cyanosis or clubbing Neuro:  alert, non focal       Assessment & Plan:

## 2018-03-30 NOTE — Patient Instructions (Signed)
Normal lung because of shortness of breath identified. You may benefit from a rehab/exercise program   will review your echocardiogram

## 2018-03-30 NOTE — Assessment & Plan Note (Signed)
No obvious etiology identified.  She does not seem to desaturate on exertion.  Spirometry does not show any evidence of airway obstruction.  Bronchodilators would not be of benefit.  She does not appear to have any risk factors for VTE. Would suggest cardiac stress test for completion.  We will review her echocardiogram and make sure that aortic valve is not worse, clinically does not seem to be worse she has chronic pedal edema but does not seem to have any overt signs of heart failure.  Deconditioning certainly is possible and have encouraged her to start on an exercise program and use the stationary bike that she has at home

## 2018-04-01 ENCOUNTER — Telehealth: Payer: Self-pay | Admitting: Pulmonary Disease

## 2018-04-01 NOTE — Telephone Encounter (Signed)
Called and spoke with pt who stated at her visit with Dr. Elsworth Soho, a release of information was filled out so we could be able to receive a copy of pt's last echo she had done.  Cherina, please advise if we have received pt's echo so RA can review it and let us know what results are for pt. Thanks!

## 2018-04-04 NOTE — Telephone Encounter (Signed)
Echo has not been received. Will give [facility] another day before calling and checking on this request.

## 2018-04-08 NOTE — Telephone Encounter (Signed)
I have not. Sent another fax to [facility].

## 2018-04-08 NOTE — Telephone Encounter (Signed)
Cherina, please advise if you have heard or received anything from [facility].

## 2018-04-11 NOTE — Telephone Encounter (Signed)
Please advise if this has been received. Do we need to request it again? Thanks.

## 2018-04-11 NOTE — Telephone Encounter (Signed)
[facility], patient has never been seen there before.   Left message for patient to see if she could remember where she went.

## 2018-04-13 NOTE — Telephone Encounter (Signed)
Called patient, unable to reach left message to give [place] a call back in regards to this.

## 2018-04-14 NOTE — Telephone Encounter (Signed)
Patient calling back regarding echo.  States the same as telephone note 04/14/18 at 9:57.Marland Kitchen...  Dr. Landry Corporal [phone]  She states she has signed release.  Patient's [phone].

## 2018-04-14 NOTE — Telephone Encounter (Signed)
Spoke with pt, she states she will call back with the information to let us know where to call. Will await return call.

## 2018-04-14 NOTE — Telephone Encounter (Signed)
Echo results finally received. Will leave in RA's look-at folder.

## 2018-04-14 NOTE — Telephone Encounter (Signed)
Per Pt, she had the echo done at Dr. [facility] office. Per Provider Finder, Dr. Thurman Coyer phone number is [phone]. Pt Cb is [phone].

## 2018-04-14 NOTE — Telephone Encounter (Signed)
Spoke with Dr. Thurman Coyer office and they are going to refax the echocardiogram. CP look out for this to come to RA.

## 2018-04-18 NOTE — Telephone Encounter (Signed)
Dr. Elsworth Soho is not back in the office until 05/02/18. Results will be addressed then.

## 2018-04-27 NOTE — Telephone Encounter (Signed)
Reviewed her echo results from 03/18/2018, aortic valve regurgitation appears to be worse compared to 2014.  There is also moderate mitral regurgitation.  She needs to discuss with Dr. Wynonia Lawman. This may be the cause of her shortness of breath

## 2018-04-27 NOTE — Telephone Encounter (Signed)
Patient made aware of this and has followed  Up with cardiology.

## 2018-04-28 ENCOUNTER — Other Ambulatory Visit: Payer: Self-pay | Admitting: Cardiology

## 2018-04-28 ENCOUNTER — Ambulatory Visit (HOSPITAL_COMMUNITY)
Admission: RE | Admit: 2018-04-28 | Discharge: 2018-04-28 | Disposition: A | Discharge status: Home / Self Care | Payer: Medicare Other | Source: Ambulatory Visit | Attending: Vascular Surgery | Admitting: Vascular Surgery

## 2018-04-28 DIAGNOSIS — R609 Edema, unspecified: Secondary | ICD-10-CM

## 2018-05-05 ENCOUNTER — Encounter: Payer: Self-pay | Admitting: Cardiology

## 2018-05-05 ENCOUNTER — Other Ambulatory Visit: Payer: Self-pay | Admitting: Cardiology

## 2018-05-05 NOTE — H&P (View-Only) (Signed)
Cheryl Wood  Date of visit:  05/05/2018 DOB:  [birth year]    Age:  82 yrs. Medical record number:  [id number]     Account number:  [id number] Primary Care Provider: Claris Gower ____________________________ CURRENT DIAGNOSES  1. Dyspnea  2. Hypertensive heart disease without heart failure  3. Mitral valve regurgitation  4. Aortic valve insufficiency  5. Chronic venous insufficiency  6. Obesity  7. Hyperlipidemia  8. Personal history of transient ischemic attack (TIA), and cerebral infarction without residual deficits ____________________________ ALLERGIES  Tetanus Toxoid, Intolerance-unknown ____________________________ MEDICATIONS  1. amoxicillin 500 mg tablet, predental  2. Azopt 1 % eye drops,suspension, Take as directed in left eye  3. benzonatate 200 mg capsule, PRN  4. clopidogrel 75 mg tablet, 1 p.o. daily  5. famciclovir 500 mg tablet, TID PRN  6. famotidine 20 mg tablet, BID  7. furosemide 80 mg tablet, 1 to 2 tablets p.o. daily PRN  8. Klor-Con M20 mEq tablet,extended release, 1 p.o. daily  9. loratadine 10 mg tablet, 1 p.o. daily  10. losartan 100 mg tablet, 1 p.o. daily  11. lovastatin 20 mg tablet, 1 p.o. daily  12. pramipexole 0.25 mg tablet, 1/2 tab daily  13. prednisolone sodium phosphate 1 % eye drops, Take as directed TID into left eye  14. tramadol 50 mg tablet, PRN  15. Vitamin B-2 100 mg tablet, 1 p.o. daily  16. Vitamin D2 50,000 unit capsule, 1 capsule MF ____________________________ HISTORY OF PRESENT ILLNESS This 82 year old female is seen at the request of Dr. Arelia Sneddon for evaluation of dyspnea and for stress testing. The patient gives a history of rheumatic fever and was placed on bed rest and treated with antibiotics as a child. Her activity was restricted and she was told that she had an abnormal valve but never had an evaluation as an adult. She had done fairly well over the years and smoked in her early adulthood but has not smoked in over 40 years.  One year ago she was on limited and could do normal activities. She states over the past 4 months she has developed worsening dyspnea to the point that a colleague at work noted that she was wheezing and she has now become dyspneic nor she cannot do much in the way of activity. She had an echocardiogram that showed moderate to severe LVH moderate mitral and aortic regurgitation at the office. She evidently was seen by pulmonary who recommended that she have a treadmill test in Dr. Arelia Sneddon wished for her to be seen here. She has not had any chest discomfort suggestive of angina. She has a prior history of hypertension for many years. She denies PND but does have some edema and has been told to wear support stockings in the past. She has not had an evaluation for pulmonary emboli. She has no prior history of atrial fibrillation. She has been modestly obese. 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 At that time she was reported as having mild to moderate aortic regurgitation.  ECHO showed severe LVH and  moderate aortic and mitral regurgitation. A recent lower extremity venous Doppler did not show any significant blood clots or venous disease. She returns for followup today and is still quite dyspneic on exertion. Her BNP level is normal at 25. Her dyspnea has been fairly abrupt over the past year and have recommended  that she undergo cardiac catheterization. ____________________________ PAST HISTORY  Past Medical Illnesses:  hypertension, hyperlipidemia, obesity, TIA, history of CVA 2006, history of transient global amnesia;  Cardiovascular Illnesses:  aortic regurgitation;  Infectious Diseases:  no previous history of significant infectious diseases;  Surgical Procedures:  hysterectomy, cataract extraction;  Trauma History:  no previous history of significant trauma;   NYHA Classification:  II;  Canadian Angina Classification:  Class 0: Asymptomatic;  Cardiology Procedures-Invasive:  no previous interventional or invasive cardiology procedures;  Cardiology Procedures-Noninvasive:  echocardiogram;  Peripheral Vascular Procedures:  no previous invasive peripheral vascular procedures.;  LVEF of 65% documented via echocardiogram on 03/18/2018,   ____________________________ CARDIO-PULMONARY TEST DATESEchocardiography Date: 03/18/2018;   ____________________________ SOCIAL HISTORY Alcohol Use:  does not use alcohol;  Smoking:  used to smoke but quit Prior to 1980, 5 pack year history;  Diet:  regular diet;  Lifestyle:  divorced and 2 daughters;  Exercise:  no regular exercise;  Occupation:  Network engineer;  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 Eyes: iritis, glaucoma Ears, Nose, Throat, Mouth:  denies any hearing loss, epistaxis, hoarseness or difficulty speaking. Respiratory: dyspnea with exertion Cardiovascular:  please review HPI Abdominal: denies dyspepsia, GI bleeding, constipation, or diarrheaGenitourinary-Female: frequency, nocturia, stress incontinence Musculoskeletal:  occasional arthritis of hands Neurological:  denies headaches, stroke, or TIA Psychiatric:  denies depression or anxiety Hematological/Immunologic:  denies any food allergies, bleeding disorders. ____________________________ PHYSICAL EXAMINATION VITAL SIGNS  Blood Pressure:  140/82 Sitting, Right arm, large cuff  , 128/80 Standing, Right arm and large cuff   Pulse:  80/min. Weight:  173.00 lbs. Height:  62.00"BMI: 31  Constitutional:  pleasant white female, in no acute distress, mildly obese Skin:  warm and dry to touch, no apparent skin lesions, or masses noted. Head:  normocephalic, normal hair pattern, no masses or tenderness Eyes:  EOMS Intact, PERRLA, C and S clear,  Funduscopic exam not done. ENT:  ears, nose and throat reveal no gross abnormalities.  Dentition good. Neck:  supple, without massess. No JVD, thyromegaly or carotid bruits. Carotid upstroke normal. Chest:  normal symmetry, clear to auscultation. Cardiac:  regular rhythm, normal S1 and S2, no S3 or S4, grade 1/6 systolic murmur at aortic area radiating to neck Abdomen:  abdomen soft,non-tender, no masses, no hepatospenomegaly, or aneurysm noted Peripheral Pulses:  the femoral,dorsalis pedis, and posterior tibial pulses are full and equal bilaterally with no bruits auscultated. Extremities & Back:  1+ edema, bilateral venous insufficiency changes present, no spinal abnormalities noted., normal muscle strength and tone. Neurological:  no gross motor or sensory deficits noted, affect appropriate, oriented x3. ____________________________ MOST RECENT LIPID PANEL 10/23/13  CHOL TOTL 168 mg/dl, LDL 100 NM, HDL 44 mg/dl and TRIGLYCER 120 mg/dl ____________________________ IMPRESSIONS/PLAN 1. Unexplained dyspnea with very poor exercise tolerance 2. Hypertensive heart disease 3. Severe LVH 4. Moderate aortic and mitral regurgitation  Recommendations:  She is to have a right and left heart catheterization which will be arranged by [facility].Cardiac catheterization was discussed with the patient including risks of myocardial infarction, death, stroke, bleeding, arrhythmia, dye allergy, or renal insufficiency. He understands and is willing to proceed. Possibility of percutaneous intervention at the same setting was also discussed with the patient including risks. Laboratory work drawn today.  ____________________________ TODAYS ORDERS  1. Draw PT/INR: Today  2. Comprehensive Metabolic Panel: Today  3. Complete Blood Count: Today  4. PTT: Today  5. Right and Left Heart Cath: At [facility]                       ____________________________ Cardiology Physician:  Kerry Hough MD [facility]

## 2018-05-05 NOTE — Progress Notes (Signed)
Cheryl Wood  Date of visit:  05/05/2018 DOB:  02/03/1936    Age:  82 yrs. Medical record number:  [id number]     Account number:  [id number] Primary Care Provider: Claris Wood ____________________________ CURRENT DIAGNOSES  1. Dyspnea  2. Hypertensive heart disease without heart failure  3. Mitral valve regurgitation  4. Aortic valve insufficiency  5. Chronic venous insufficiency  6. Obesity  7. Hyperlipidemia  8. Personal history of transient ischemic attack (TIA), and cerebral infarction without residual deficits ____________________________ ALLERGIES  Tetanus Toxoid, Intolerance-unknown ____________________________ MEDICATIONS  1. amoxicillin 500 mg tablet, predental  2. Azopt 1 % eye drops,suspension, Take as directed in left eye  3. benzonatate 200 mg capsule, PRN  4. clopidogrel 75 mg tablet, 1 p.o. daily  5. famciclovir 500 mg tablet, TID PRN  6. famotidine 20 mg tablet, BID  7. furosemide 80 mg tablet, 1 to 2 tablets p.o. daily PRN  8. Klor-Con M20 mEq tablet,extended release, 1 p.o. daily  9. loratadine 10 mg tablet, 1 p.o. daily  10. losartan 100 mg tablet, 1 p.o. daily  11. lovastatin 20 mg tablet, 1 p.o. daily  12. pramipexole 0.25 mg tablet, 1/2 tab daily  13. prednisolone sodium phosphate 1 % eye drops, Take as directed TID into left eye  14. tramadol 50 mg tablet, PRN  15. Vitamin B-2 100 mg tablet, 1 p.o. daily  16. Vitamin D2 50,000 unit capsule, 1 capsule MF ____________________________ HISTORY OF PRESENT ILLNESS This 82 year old female is seen at the request of Dr. Arelia Wood for evaluation of dyspnea and for stress testing. The patient gives a history of rheumatic fever and was placed on bed rest and treated with antibiotics as a child. Her activity was restricted and she was told that she had an abnormal valve but never had an evaluation as an adult. She had done fairly well over the years and smoked in her early adulthood but has not smoked in over 40 years.  One year ago she was on limited and could do normal activities. She states over the past 4 months she has developed worsening dyspnea to the point that a colleague at work noted that she was wheezing and she has now become dyspneic nor she cannot do much in the way of activity. She had an echocardiogram that showed moderate to severe LVH moderate mitral and aortic regurgitation at the office. She evidently was seen by pulmonary who recommended that she have a treadmill test in Dr. Arelia Wood wished for her to be seen here. She has not had any chest discomfort suggestive of angina. She has a prior history of hypertension for many years. She denies PND but does have some edema and has been told to wear support stockings in the past. She has not had an evaluation for pulmonary emboli. She has no prior history of atrial fibrillation. She has been modestly obese. 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 At that time she was reported as having mild to moderate aortic regurgitation.  ECHO showed severe LVH and  moderate aortic and mitral regurgitation. A recent lower extremity venous Doppler did not show any significant blood clots or venous disease. She returns for followup today and is still quite dyspneic on exertion. Her BNP level is normal at 25. Her dyspnea has been fairly abrupt over the past year and have recommended  that she undergo cardiac catheterization. ____________________________ PAST HISTORY  Past Medical Illnesses:  hypertension, hyperlipidemia, obesity, TIA, history of CVA 2006, history of transient global amnesia;  Cardiovascular Illnesses:  aortic regurgitation;  Infectious Diseases:  no previous history of significant infectious diseases;  Surgical Procedures:  hysterectomy, cataract extraction;  Trauma History:  no previous history of significant trauma;   NYHA Classification:  II;  Canadian Angina Classification:  Class 0: Asymptomatic;  Cardiology Procedures-Invasive:  no previous interventional or invasive cardiology procedures;  Cardiology Procedures-Noninvasive:  echocardiogram;  Peripheral Vascular Procedures:  no previous invasive peripheral vascular procedures.;  LVEF of 65% documented via echocardiogram on 03/18/2018,   ____________________________ CARDIO-PULMONARY TEST DATESEchocardiography Date: 03/18/2018;   ____________________________ SOCIAL HISTORY Alcohol Use:  does not use alcohol;  Smoking:  used to smoke but quit Prior to 1980, 5 pack year history;  Diet:  regular diet;  Lifestyle:  divorced and 2 daughters;  Exercise:  no regular exercise;  Occupation:  Network engineer;  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 Eyes: iritis, glaucoma Ears, Nose, Throat, Mouth:  denies any hearing loss, epistaxis, hoarseness or difficulty speaking. Respiratory: dyspnea with exertion Cardiovascular:  please review HPI Abdominal: denies dyspepsia, GI bleeding, constipation, or diarrheaGenitourinary-Female: frequency, nocturia, stress incontinence Musculoskeletal:  occasional arthritis of hands Neurological:  denies headaches, stroke, or TIA Psychiatric:  denies depression or anxiety Hematological/Immunologic:  denies any food allergies, bleeding disorders. ____________________________ PHYSICAL EXAMINATION VITAL SIGNS  Blood Pressure:  140/82 Sitting, Right arm, large cuff  , 128/80 Standing, Right arm and large cuff   Pulse:  80/min. Weight:  173.00 lbs. Height:  62.00"BMI: 31  Constitutional:  pleasant white female, in no acute distress, mildly obese Skin:  warm and dry to touch, no apparent skin lesions, or masses noted. Head:  normocephalic, normal hair pattern, no masses or tenderness Eyes:  EOMS Intact, PERRLA, C and S clear,  Funduscopic exam not done. ENT:  ears, nose and throat reveal no gross abnormalities.  Dentition good. Neck:  supple, without massess. No JVD, thyromegaly or carotid bruits. Carotid upstroke normal. Chest:  normal symmetry, clear to auscultation. Cardiac:  regular rhythm, normal S1 and S2, no S3 or S4, grade 1/6 systolic murmur at aortic area radiating to neck Abdomen:  abdomen soft,non-tender, no masses, no hepatospenomegaly, or aneurysm noted Peripheral Pulses:  the femoral,dorsalis pedis, and posterior tibial pulses are full and equal bilaterally with no bruits auscultated. Extremities & Back:  1+ edema, bilateral venous insufficiency changes present, no spinal abnormalities noted., normal muscle strength and tone. Neurological:  no gross motor or sensory deficits noted, affect appropriate, oriented x3. ____________________________ MOST RECENT LIPID PANEL 10/23/13  CHOL TOTL 168 mg/dl, LDL 100 NM, HDL 44 mg/dl and TRIGLYCER 120 mg/dl ____________________________ IMPRESSIONS/PLAN 1. Unexplained dyspnea with very poor exercise tolerance 2. Hypertensive heart disease 3. Severe LVH 4. Moderate aortic and mitral regurgitation  Recommendations:  She is to have a right and left heart catheterization which will be arranged by [facility].Cardiac catheterization was discussed with the patient including risks of myocardial infarction, death, stroke, bleeding, arrhythmia, dye allergy, or renal insufficiency. He understands and is willing to proceed. Possibility of percutaneous intervention at the same setting was also discussed with the patient including risks. Laboratory work drawn today.  ____________________________ TODAYS ORDERS  1. Draw PT/INR: Today  2. Comprehensive Metabolic Panel: Today  3. Complete Blood Count: Today  4. PTT: Today  5. Right and Left Heart Cath: At [facility]                       ____________________________ Cardiology Physician:  Kerry Hough MD [facility]

## 2018-05-12 ENCOUNTER — Encounter (HOSPITAL_COMMUNITY): Payer: Self-pay | Admitting: Interventional Cardiology

## 2018-05-12 ENCOUNTER — Ambulatory Visit (HOSPITAL_COMMUNITY)
Admission: RE | Admit: 2018-05-12 | Discharge: 2018-05-12 | Disposition: A | Discharge status: Home / Self Care | Payer: Medicare Other | Source: Ambulatory Visit | Attending: Interventional Cardiology | Admitting: Interventional Cardiology

## 2018-05-12 ENCOUNTER — Encounter (HOSPITAL_COMMUNITY)
Admission: RE | Disposition: A | Discharge status: Home / Self Care | Payer: Self-pay | Source: Ambulatory Visit | Attending: Interventional Cardiology

## 2018-05-12 DIAGNOSIS — I080 Rheumatic disorders of both mitral and aortic valves: Secondary | ICD-10-CM | POA: Insufficient documentation

## 2018-05-12 DIAGNOSIS — I69351 Hemiplegia and hemiparesis following cerebral infarction affecting right dominant side: Secondary | ICD-10-CM | POA: Diagnosis not present

## 2018-05-12 DIAGNOSIS — Z87891 Personal history of nicotine dependence: Secondary | ICD-10-CM | POA: Diagnosis not present

## 2018-05-12 DIAGNOSIS — R0600 Dyspnea, unspecified: Secondary | ICD-10-CM | POA: Diagnosis present

## 2018-05-12 DIAGNOSIS — E669 Obesity, unspecified: Secondary | ICD-10-CM | POA: Diagnosis not present

## 2018-05-12 DIAGNOSIS — I351 Nonrheumatic aortic (valve) insufficiency: Secondary | ICD-10-CM

## 2018-05-12 DIAGNOSIS — G454 Transient global amnesia: Secondary | ICD-10-CM | POA: Insufficient documentation

## 2018-05-12 DIAGNOSIS — E785 Hyperlipidemia, unspecified: Secondary | ICD-10-CM | POA: Diagnosis not present

## 2018-05-12 DIAGNOSIS — R0609 Other forms of dyspnea: Secondary | ICD-10-CM | POA: Diagnosis present

## 2018-05-12 DIAGNOSIS — I872 Venous insufficiency (chronic) (peripheral): Secondary | ICD-10-CM | POA: Insufficient documentation

## 2018-05-12 DIAGNOSIS — Z6831 Body mass index (BMI) 31.0-31.9, adult: Secondary | ICD-10-CM | POA: Diagnosis not present

## 2018-05-12 DIAGNOSIS — I119 Hypertensive heart disease without heart failure: Secondary | ICD-10-CM | POA: Diagnosis not present

## 2018-05-12 DIAGNOSIS — I2510 Atherosclerotic heart disease of native coronary artery without angina pectoris: Secondary | ICD-10-CM | POA: Insufficient documentation

## 2018-05-12 HISTORY — PX: RIGHT/LEFT HEART CATH AND CORONARY ANGIOGRAPHY: CATH118266

## 2018-05-12 HISTORY — PX: THORACIC AORTOGRAM: CATH118269

## 2018-05-12 LAB — POCT I-STAT 3, ART BLOOD GAS (G3+)
Acid-base deficit: 3 mmol/L — ABNORMAL HIGH (ref 0.0–2.0)
BICARBONATE: 23.4 mmol/L (ref 20.0–28.0)
O2 Saturation: 94 %
PH ART: 7.327 — AB (ref 7.350–7.450)
TCO2: 25 mmol/L (ref 22–32)
pCO2 arterial: 44.6 mmHg (ref 32.0–48.0)
pO2, Arterial: 78 mmHg — ABNORMAL LOW (ref 83.0–108.0)

## 2018-05-12 LAB — CBC
HCT: 40.5 % (ref 36.0–46.0)
Hemoglobin: 13.1 g/dL (ref 12.0–15.0)
MCH: 31.1 pg (ref 26.0–34.0)
MCHC: 32.3 g/dL (ref 30.0–36.0)
MCV: 96.2 fL (ref 78.0–100.0)
Platelets: 175 10*3/uL (ref 150–400)
RBC: 4.21 MIL/uL (ref 3.87–5.11)
RDW: 13.1 % (ref 11.5–15.5)
WBC: 5.9 10*3/uL (ref 4.0–10.5)

## 2018-05-12 LAB — POCT I-STAT 3, VENOUS BLOOD GAS (G3P V)
Acid-base deficit: 1 mmol/L (ref 0.0–2.0)
BICARBONATE: 25.9 mmol/L (ref 20.0–28.0)
O2 SAT: 71 %
PO2 VEN: 41 mmHg (ref 32.0–45.0)
TCO2: 27 mmol/L (ref 22–32)
pCO2, Ven: 50.8 mmHg (ref 44.0–60.0)
pH, Ven: 7.316 (ref 7.250–7.430)

## 2018-05-12 LAB — BASIC METABOLIC PANEL
Anion gap: 12 (ref 5–15)
BUN: 23 mg/dL — AB (ref 6–20)
CALCIUM: 9.7 mg/dL (ref 8.9–10.3)
CO2: 26 mmol/L (ref 22–32)
Chloride: 105 mmol/L (ref 101–111)
Creatinine, Ser: 1.1 mg/dL — ABNORMAL HIGH (ref 0.44–1.00)
GFR calc Af Amer: 53 mL/min — ABNORMAL LOW (ref >60)
GFR calc non Af Amer: 46 mL/min — ABNORMAL LOW (ref >60)
Glucose, Bld: 137 mg/dL — ABNORMAL HIGH (ref 65–99)
Potassium: 4.3 mmol/L (ref 3.5–5.1)
SODIUM: 143 mmol/L (ref 135–145)

## 2018-05-12 SURGERY — RIGHT/LEFT HEART CATH AND CORONARY ANGIOGRAPHY
Anesthesia: LOCAL

## 2018-05-12 MED ORDER — MIDAZOLAM HCL 2 MG/2ML IJ SOLN
INTRAMUSCULAR | Status: AC
  Filled 2018-05-12: qty 2

## 2018-05-12 MED ORDER — SODIUM CHLORIDE 0.9% FLUSH: 3.0000 mL | Freq: Two times a day (BID) | INTRAVENOUS | Status: DC

## 2018-05-12 MED ORDER — IOPAMIDOL (ISOVUE-370) INJECTION 76%
INTRAVENOUS | Status: AC
  Filled 2018-05-12: qty 100

## 2018-05-12 MED ORDER — HEPARIN SODIUM (PORCINE) 1000 UNIT/ML IJ SOLN
INTRAMUSCULAR | Status: AC
  Filled 2018-05-12: qty 1

## 2018-05-12 MED ORDER — OXYCODONE HCL 5 MG PO TABS: 5.0000 mg | ORAL_TABLET | ORAL | Status: DC | PRN

## 2018-05-12 MED ORDER — SODIUM CHLORIDE 0.9 % IV SOLN: 250.0000 mL | INTRAVENOUS | Status: DC | PRN

## 2018-05-12 MED ORDER — ASPIRIN 81 MG PO CHEW
CHEWABLE_TABLET | ORAL | Status: AC
  Filled 2018-05-12: qty 1

## 2018-05-12 MED ORDER — SODIUM CHLORIDE 0.9% FLUSH: 3.0000 mL | INTRAVENOUS | Status: DC | PRN

## 2018-05-12 MED ORDER — VERAPAMIL HCL 2.5 MG/ML IV SOLN
INTRAVENOUS | Status: AC
  Filled 2018-05-12: qty 2

## 2018-05-12 MED ORDER — HEPARIN SODIUM (PORCINE) 1000 UNIT/ML IJ SOLN
INTRAMUSCULAR | Status: DC | PRN
  Administered 2018-05-12: 4000 [IU] via INTRAVENOUS

## 2018-05-12 MED ORDER — MIDAZOLAM HCL 2 MG/2ML IJ SOLN
INTRAMUSCULAR | Status: DC | PRN
  Administered 2018-05-12: 0.5 mg via INTRAVENOUS
  Administered 2018-05-12: 1 mg via INTRAVENOUS

## 2018-05-12 MED ORDER — HEPARIN (PORCINE) IN NACL 1000-0.9 UT/500ML-% IV SOLN
INTRAVENOUS | Status: AC
  Filled 2018-05-12: qty 1000

## 2018-05-12 MED ORDER — HEPARIN (PORCINE) IN NACL 2-0.9 UNITS/ML
INTRAMUSCULAR | Status: AC | PRN
  Administered 2018-05-12 (×2): 500 mL

## 2018-05-12 MED ORDER — CLOPIDOGREL BISULFATE 75 MG PO TABS
75.0000 mg | ORAL_TABLET | Freq: Once | ORAL | Status: AC
Start: 2018-05-12 — End: 2018-05-12
  Administered 2018-05-12: 75 mg via ORAL

## 2018-05-12 MED ORDER — LIDOCAINE HCL (PF) 1 % IJ SOLN
INTRAMUSCULAR | Status: AC
  Filled 2018-05-12: qty 30

## 2018-05-12 MED ORDER — CLOPIDOGREL BISULFATE 75 MG PO TABS
ORAL_TABLET | ORAL | Status: AC
  Filled 2018-05-12: qty 1

## 2018-05-12 MED ORDER — FENTANYL CITRATE (PF) 100 MCG/2ML IJ SOLN
INTRAMUSCULAR | Status: AC
  Filled 2018-05-12: qty 2

## 2018-05-12 MED ORDER — SODIUM CHLORIDE 0.9 % IV SOLN: INTRAVENOUS | Status: DC

## 2018-05-12 MED ORDER — IOPAMIDOL (ISOVUE-370) INJECTION 76%
INTRAVENOUS | Status: DC | PRN
  Administered 2018-05-12: 120 mL via INTRAVENOUS

## 2018-05-12 MED ORDER — SODIUM CHLORIDE 0.9 % WEIGHT BASED INFUSION
3.0000 mL/kg/h | INTRAVENOUS | Status: AC
  Administered 2018-05-12: 3 mL/kg/h via INTRAVENOUS

## 2018-05-12 MED ORDER — VERAPAMIL HCL 2.5 MG/ML IV SOLN
INTRAVENOUS | Status: DC | PRN
  Administered 2018-05-12: 10 mL via INTRA_ARTERIAL

## 2018-05-12 MED ORDER — LIDOCAINE HCL (PF) 1 % IJ SOLN
INTRAMUSCULAR | Status: DC | PRN
  Administered 2018-05-12: 2 mL

## 2018-05-12 MED ORDER — FENTANYL CITRATE (PF) 100 MCG/2ML IJ SOLN
INTRAMUSCULAR | Status: DC | PRN
  Administered 2018-05-12: 50 ug via INTRAVENOUS
  Administered 2018-05-12: 25 ug via INTRAVENOUS

## 2018-05-12 MED ORDER — ACETAMINOPHEN 325 MG PO TABS: 650.0000 mg | ORAL_TABLET | ORAL | Status: DC | PRN

## 2018-05-12 MED ORDER — SODIUM CHLORIDE 0.9 % WEIGHT BASED INFUSION: 1.0000 mL/kg/h | INTRAVENOUS | Status: DC

## 2018-05-12 MED ORDER — ONDANSETRON HCL 4 MG/2ML IJ SOLN: 4.0000 mg | Freq: Four times a day (QID) | INTRAMUSCULAR | Status: DC | PRN

## 2018-05-12 MED ORDER — ASPIRIN 81 MG PO CHEW
81.0000 mg | CHEWABLE_TABLET | ORAL | Status: AC
  Administered 2018-05-12: 81 mg via ORAL

## 2018-05-12 SURGICAL SUPPLY — 19 items
CATH BALLN WEDGE 5F 110CM (CATHETERS) ×2 IMPLANT
CATH INFINITI 5 FR JL3.5 (CATHETERS) ×2 IMPLANT
CATH INFINITI 5FR ANG PIGTAIL (CATHETERS) ×2 IMPLANT
CATH INFINITI 5FR JL4 (CATHETERS) ×2 IMPLANT
CATH INFINITI 5FR JL5 (CATHETERS) ×2 IMPLANT
CATH INFINITI JR4 5F (CATHETERS) ×2 IMPLANT
COVER PRB 48X5XTLSCP FOLD TPE (BAG) ×1 IMPLANT
COVER PROBE 5X48 (BAG) ×1
DEVICE RAD COMP TR BAND LRG (VASCULAR PRODUCTS) ×2 IMPLANT
GLIDESHEATH SLEND A-KIT 6F 22G (SHEATH) ×2 IMPLANT
GUIDEWIRE INQWIRE 1.5J.035X260 (WIRE) ×1 IMPLANT
INQWIRE 1.5J .035X260CM (WIRE) ×2
KIT HEART LEFT (KITS) ×2 IMPLANT
PACK CARDIAC CATHETERIZATION (CUSTOM PROCEDURE TRAY) ×2 IMPLANT
SHEATH RAIN 4/5FR (SHEATH) ×2 IMPLANT
SYR MEDRAD MARK V 150ML (SYRINGE) ×2 IMPLANT
TRANSDUCER W/STOPCOCK (MISCELLANEOUS) ×2 IMPLANT
TUBING CIL FLEX 10 FLL-RA (TUBING) ×2 IMPLANT
WIRE HI TORQ VERSACORE-J 145CM (WIRE) ×2 IMPLANT

## 2018-05-12 NOTE — Discharge Instructions (Signed)

## 2018-05-12 NOTE — Interval H&P Note (Signed)
Cath Lab Visit (complete for each Cath Lab visit)  Clinical Evaluation Leading to the Procedure:   ACS: No.  Non-ACS:    Anginal Classification: CCS III  Anti-ischemic medical therapy: Maximal Therapy (2 or more classes of medications)  Non-Invasive Test Results: No non-invasive testing performed  Prior CABG: No previous CABG      History and Physical Interval Note:  05/12/2018 7:36 AM  Cheryl Wood  has presented today for surgery, with the diagnosis of dyspnea  The various methods of treatment have been discussed with the patient and family. After consideration of risks, benefits and other options for treatment, the patient has consented to  Procedure(s): RIGHT/LEFT HEART CATH AND CORONARY ANGIOGRAPHY (N/A) as a surgical intervention .  The patient's history has been reviewed, patient examined, no change in status, stable for surgery.  I have reviewed the patient's chart and labs.  Questions were answered to the patient's satisfaction.     Belva Crome III

## 2018-05-13 MED FILL — Heparin Sod (Porcine)-NaCl IV Soln 1000 Unit/500ML-0.9%: INTRAVENOUS | Qty: 1000 | Status: AC

## 2018-07-21 DIAGNOSIS — H4042X2 Glaucoma secondary to eye inflammation, left eye, moderate stage: Secondary | ICD-10-CM | POA: Insufficient documentation

## 2018-07-21 DIAGNOSIS — H4041X1 Glaucoma secondary to eye inflammation, right eye, mild stage: Secondary | ICD-10-CM | POA: Insufficient documentation

## 2018-07-29 DIAGNOSIS — H209 Unspecified iridocyclitis: Secondary | ICD-10-CM | POA: Insufficient documentation

## 2018-07-29 DIAGNOSIS — H3581 Retinal edema: Secondary | ICD-10-CM | POA: Insufficient documentation

## 2018-07-29 DIAGNOSIS — Z961 Presence of intraocular lens: Secondary | ICD-10-CM | POA: Insufficient documentation

## 2018-09-13 LAB — BASIC METABOLIC PANEL
Glucose: 193
POTASSIUM: 3.9 (ref 3.4–5.3)
SODIUM: 143 (ref 137–147)

## 2018-09-13 LAB — HEPATIC FUNCTION PANEL
ALT: 23 (ref 7–35)
AST: 20 (ref 13–35)
Alkaline Phosphatase: 55 (ref 25–125)

## 2018-12-09 DIAGNOSIS — I351 Nonrheumatic aortic (valve) insufficiency: Secondary | ICD-10-CM

## 2018-12-09 DIAGNOSIS — I2510 Atherosclerotic heart disease of native coronary artery without angina pectoris: Secondary | ICD-10-CM

## 2018-12-13 HISTORY — PX: EYE SURGERY: SHX253

## 2019-01-24 DIAGNOSIS — H209 Unspecified iridocyclitis: Secondary | ICD-10-CM | POA: Diagnosis not present

## 2019-01-24 DIAGNOSIS — H4042X2 Glaucoma secondary to eye inflammation, left eye, moderate stage: Secondary | ICD-10-CM | POA: Diagnosis not present

## 2019-01-24 DIAGNOSIS — Z961 Presence of intraocular lens: Secondary | ICD-10-CM | POA: Diagnosis not present

## 2019-01-24 DIAGNOSIS — H35372 Puckering of macula, left eye: Secondary | ICD-10-CM | POA: Diagnosis not present

## 2019-01-24 DIAGNOSIS — H35033 Hypertensive retinopathy, bilateral: Secondary | ICD-10-CM | POA: Diagnosis not present

## 2019-01-25 ENCOUNTER — Encounter: Payer: Self-pay | Admitting: Family Medicine

## 2019-01-25 ENCOUNTER — Ambulatory Visit (INDEPENDENT_AMBULATORY_CARE_PROVIDER_SITE_OTHER): Payer: Medicare HMO | Admitting: Family Medicine

## 2019-01-25 VITALS — BP 122/82 | HR 84 | Temp 97.8°F | Ht 62.0 in | Wt 168.0 lb

## 2019-01-25 DIAGNOSIS — H409 Unspecified glaucoma: Secondary | ICD-10-CM | POA: Insufficient documentation

## 2019-01-25 DIAGNOSIS — I10 Essential (primary) hypertension: Secondary | ICD-10-CM | POA: Diagnosis not present

## 2019-01-25 DIAGNOSIS — Z0000 Encounter for general adult medical examination without abnormal findings: Secondary | ICD-10-CM | POA: Diagnosis not present

## 2019-01-25 DIAGNOSIS — E782 Mixed hyperlipidemia: Secondary | ICD-10-CM | POA: Diagnosis not present

## 2019-01-25 DIAGNOSIS — I639 Cerebral infarction, unspecified: Secondary | ICD-10-CM | POA: Insufficient documentation

## 2019-01-25 DIAGNOSIS — G2581 Restless legs syndrome: Secondary | ICD-10-CM | POA: Insufficient documentation

## 2019-01-25 DIAGNOSIS — R7309 Other abnormal glucose: Secondary | ICD-10-CM

## 2019-01-25 LAB — COMPREHENSIVE METABOLIC PANEL
ALBUMIN: 4.4 g/dL (ref 3.5–5.2)
ALT: 16 U/L (ref 0–35)
AST: 17 U/L (ref 0–37)
Alkaline Phosphatase: 66 U/L (ref 39–117)
BUN: 20 mg/dL (ref 6–23)
CALCIUM: 9.9 mg/dL (ref 8.4–10.5)
CHLORIDE: 104 meq/L (ref 96–112)
CO2: 31 mEq/L (ref 19–32)
Creatinine, Ser: 0.85 mg/dL (ref 0.40–1.20)
GFR: 64.23 mL/min (ref >60.00)
Glucose, Bld: 125 mg/dL — ABNORMAL HIGH (ref 70–99)
Potassium: 3.7 mEq/L (ref 3.5–5.1)
Sodium: 142 mEq/L (ref 135–145)
Total Bilirubin: 0.8 mg/dL (ref 0.2–1.2)
Total Protein: 6.6 g/dL (ref 6.0–8.3)

## 2019-01-25 LAB — CBC WITH DIFFERENTIAL/PLATELET
Basophils Absolute: 0 10*3/uL (ref 0.0–0.1)
Basophils Relative: 0.4 % (ref 0.0–3.0)
Eosinophils Absolute: 0.1 10*3/uL (ref 0.0–0.7)
Eosinophils Relative: 1.9 % (ref 0.0–5.0)
HCT: 43.1 % (ref 36.0–46.0)
Hemoglobin: 14.5 g/dL (ref 12.0–15.0)
LYMPHS ABS: 2 10*3/uL (ref 0.7–4.0)
Lymphocytes Relative: 29.7 % (ref 12.0–46.0)
MCHC: 33.6 g/dL (ref 30.0–36.0)
MCV: 94.8 fl (ref 78.0–100.0)
Monocytes Absolute: 0.6 10*3/uL (ref 0.1–1.0)
Monocytes Relative: 9.1 % (ref 3.0–12.0)
Neutro Abs: 3.9 10*3/uL (ref 1.4–7.7)
Neutrophils Relative %: 58.9 % (ref 43.0–77.0)
Platelets: 220 10*3/uL (ref 150.0–400.0)
RBC: 4.54 Mil/uL (ref 3.87–5.11)
RDW: 13.2 % (ref 11.5–15.5)
WBC: 6.7 10*3/uL (ref 4.0–10.5)

## 2019-01-25 LAB — LIPID PANEL
Cholesterol: 233 mg/dL — ABNORMAL HIGH (ref 0–200)
HDL: 38.8 mg/dL — ABNORMAL LOW (ref >39.00)
NonHDL: 194.57
Total CHOL/HDL Ratio: 6
Triglycerides: 310 mg/dL — ABNORMAL HIGH (ref 0.0–149.0)
VLDL: 62 mg/dL — ABNORMAL HIGH (ref 0.0–40.0)

## 2019-01-25 LAB — DRUG SCREEN, URINE

## 2019-01-25 LAB — MICROALBUMIN / CREATININE URINE RATIO
Creatinine,U: 100.1 mg/dL
MICROALB UR: 0.9 mg/dL (ref 0.0–1.9)
MICROALB/CREAT RATIO: 0.9 mg/g (ref 0.0–30.0)

## 2019-01-25 LAB — HEMOGLOBIN A1C: HEMOGLOBIN A1C: 7 % — AB (ref 4.6–6.5)

## 2019-01-25 LAB — LDL CHOLESTEROL, DIRECT: LDL DIRECT: 136 mg/dL

## 2019-01-25 NOTE — Progress Notes (Signed)
Phone: [phone]  Subjective:  Patient presents today for their  Medicare Exam. Also has history of HTN and hyperlipidemia, CVA, CAD, RLS and is on phenteramine by another physician.   Hypertension: Here for follow up of hypertension.  Currently on cozaar 100mg .  . Takes medication as prescribed and denies any side effects. Exercise includes none, she states "I don't have time to exercise."  Weight has been increasing. Denies any chest pain, headaches, vision changes, swelling in lower extremities.   Hyperlipidemia/CAD: on statin, but she recently stopped this. She had a friend in church who told her it attacked her muscles so she stopped taking it recently. Strong FH of CAD in both mom and dad. I think she may have diabetes based off old records. Last glucose was 193 which she attributed to a cough drop, but all have been elevated.   Obesity: has a physician who has her on phenteramine. Not currently taking.    Has had both pneumonia shots, flu shot this year. Can not have shingles shot and does not do tetanus.  Mmg: 02/2018 Colonoscopy: 2005 or 2006 S/p TAH/BSO  Preventive Screening-Counseling & Management   No exam data present  Advanced directives: Yes  Smoking Status: Former smoker Quit date: 12/28/80 Second Hand Smoking status: No smokers in home  Risk Factors Regular exercise: None Diet: Well balanced  Fall Risk: None  Fall Risk  01/25/2019 01/25/2019  Falls in the past year? 0 0  Number falls in past yr: 0 0  Injury with Fall? 0 -   Opioid use history:  Tramadol prn.   Cardiac risk factors:  advanced age (older than 20 for men, 38 for women) Yes Hyperlipidemia Yes No diabetes. No Family History: Yes/both  Depression Screen None. PHQ2 0  No flowsheet data found.     Activities of Daily Living Independent ADLs and IADLs   Hearing Difficulties: -patient wears b/l hearing aids  Cognitive Testing No reported trouble.   Normal 3 word  recall Mini-cog: 4/5  List the Names of Other Physician/Practitioners you currently use:  -Dr. Edilia Bo and Dr. Brigitte Pulse: opthalmology.  -Dr. Luana Shu in [facility] sees q year.    Immunization History  Administered Date(s) Administered  . Influenza Split 08/30/2017  . Influenza-Unspecified 09/28/2018  . Pneumococcal Conjugate-13 03/29/2015    Screening tests- up to date Health Maintenance Due  Topic Date Due  . TETANUS/TDAP  04/27/1955  . PNA vac Low Risk Adult (2 of 2 - PPSV23) 03/28/2016    Review of Systems  Constitutional: Negative for chills, fever, malaise/fatigue and weight loss.  HENT: Negative for ear pain, hearing loss and sore throat.   Respiratory: Positive for shortness of breath (at baseline). Negative for cough and wheezing.   Cardiovascular: Negative for chest pain, palpitations, orthopnea and leg swelling.  Gastrointestinal: Negative for abdominal pain, diarrhea, nausea and vomiting.  Genitourinary: Negative for dysuria, frequency and urgency.  Musculoskeletal: Negative for falls and myalgias.  Skin: Negative for rash.  Neurological: Negative for dizziness, tingling, sensory change, speech change, focal weakness, loss of consciousness and headaches.  Psychiatric/Behavioral: Negative for depression, memory loss and suicidal ideas. The patient has insomnia. The patient is not nervous/anxious.      The following were reviewed and entered/updated in epic: Past Medical History:  Diagnosis Date  . Bleeding of blood vessel    ext. genitalia area  . Chronic kidney disease   . Hyperlipidemia   . Hypertension   . Memory loss   .  Stroke ([place])   . TIA (transient ischemic attack) 10/22/2013   Patient Active Problem List   Diagnosis Date Noted  . CVA (cerebral vascular accident) ([place]) 01/25/2019  . RLS (restless legs syndrome) 01/25/2019  . Glaucoma, left eye 01/25/2019  . Glaucoma associated with ocular inflammation, left, moderate stage  07/21/2018  . Nonrheumatic aortic valve insufficiency   . CAD in native artery   . HTN (hypertension) 10/22/2013  . Hyperlipidemia 10/22/2013   Past Surgical History:  Procedure Laterality Date  . ABDOMINAL HYSTERECTOMY    . CATARACT EXTRACTION Right May 13, 2014  . EYE SURGERY  12/13/2018   stent inserted in left eye  . gum transplant    . RIGHT/LEFT HEART CATH AND CORONARY ANGIOGRAPHY N/A 05/12/2018   Procedure: RIGHT/LEFT HEART CATH AND CORONARY ANGIOGRAPHY;  Surgeon: Belva Crome, MD;  Location: [facility];  Service: Cardiovascular;  Laterality: N/A;  . THORACIC AORTOGRAM N/A 05/12/2018   Procedure: THORACIC AORTOGRAM;  Surgeon: Belva Crome, MD;  Location: [facility];  Service: Cardiovascular;  Laterality: N/A;    Family History  Problem Relation Age of Onset  . Cancer Mother   . Heart disease Father   . Cancer Sister     Medications- reviewed and updated Current Outpatient Medications  Medication Sig Dispense Refill  . acetaminophen (TYLENOL) 650 MG CR tablet Take 1,300 mg by mouth 2 (two) times daily.    Marland Kitchen amoxicillin (AMOXIL) 500 MG capsule Take 500 mg by mouth 4 (four) times daily. Use prior to dental appointment  0  . clopidogrel (PLAVIX) 75 MG tablet Take 75 mg by mouth every other day.     . famciclovir (FAMVIR) 500 MG tablet Take 500 mg by mouth 3 (three) times daily.      . famciclovir (FAMVIR) 500 MG tablet Take by mouth.    . famotidine (PEPCID) 20 MG tablet Take 20 mg by mouth 2 (two) times daily.     . furosemide (LASIX) 80 MG tablet Take 160 mg by mouth daily.     Marland Kitchen ibuprofen (ADVIL,MOTRIN) 200 MG tablet Take 600 mg by mouth daily as needed for headache.    Marland Kitchen KLOR-CON M20 20 MEQ tablet Take 20 mEq by mouth daily.     Marland Kitchen loratadine (CLARITIN) 10 MG tablet Take 10 mg by mouth daily.    Marland Kitchen losartan (COZAAR) 100 MG tablet Take 100 mg by mouth daily.     Marland Kitchen lovastatin (MEVACOR) 20 MG tablet Take 20 mg by mouth every evening.  3  . phentermine  (ADIPEX-P) 37.5 MG tablet Take 37.5 mg by mouth daily.  0  . pramipexole (MIRAPEX) 0.25 MG tablet Take 0.25 mg by mouth at bedtime.    . prednisoLONE acetate (PRED FORTE) 1 % ophthalmic suspension Place 1 drop into the left eye 3 (three) times daily.     . traMADol (ULTRAM) 50 MG tablet Take 50 mg by mouth 2 (two) times daily as needed for moderate pain. Maximum dose= 8 tablets per day     No current facility-administered medications for this visit.     Allergies-reviewed and updated Allergies  Allergen Reactions  . Tetanus Toxoids Swelling    Arm was twice the size it should be  . Hctz [Hydrochlorothiazide] Rash    Social History   Socioeconomic History  . Marital status: Divorced    Spouse name: Not on file  . Number of children: 2  . Years of education: college  . Highest education  level: Not on file  Occupational History    Employer: RETIRED  Social Needs  . Financial resource strain: Not on file  . Food insecurity:    Worry: Not on file    Inability: Not on file  . Transportation needs:    Medical: Not on file    Non-medical: Not on file  Tobacco Use  . Smoking status: Former Smoker    Years: 40.00    Types: Cigarettes    Last attempt to quit: 11/05/1971    Years since quitting: 47.2  . Smokeless tobacco: Never Used  Substance and Sexual Activity  . Alcohol use: No    Alcohol/week: 0.0 standard drinks  . Drug use: No  . Sexual activity: Never  Lifestyle  . Physical activity:    Days per week: Not on file    Minutes per session: Not on file  . Stress: Not on file  Relationships  . Social connections:    Talks on phone: Not on file    Gets together: Not on file    Attends religious service: Not on file    Active member of club or organization: Not on file    Attends meetings of clubs or organizations: Not on file    Relationship status: Not on file  Other Topics Concern  . Not on file  Social History Narrative   Patient lives at home alone.   Caffeine  Use: 2-3 cups daily    Objective: BP 122/82 (BP Location: Left Arm, Patient Position: Sitting, Cuff Size: Normal)   Pulse 84   Temp 97.8 F (36.6 C) (Oral)   Ht 5\' 2"  (1.575 m)   Wt 168 lb (76.2 kg)   LMP  (LMP Unknown)   SpO2 98%   BMI 30.73 kg/m  Gen: NAD, resting comfortably. Vitals reviewed.  HEENT: Mucous membranes are moist. Oropharynx normal. TM pearly with light reflex bilaterally. Ha on left only.  Neck: no thyromegaly, no adenopathy  CV: RRR + systolic murmur, no rubs or gallops. No edema  Lungs: CTAB no crackles, wheeze, rhonchi Abdomen: soft/nontender/nondistended/normal bowel sounds. No rebound or guarding.  Ext: no edema Skin: warm, dry Neuro: grossly normal, moves all extremities, PERRLA. CN II-Xii intact.   Assessment/Plan: Annual Medicare exam completed- discussed recommended screenings anddocumented any personalized health advice and referrals for preventive counseling.   Status of chronic or acute concerns   HTN: Blood pressure is to goal. Continue current anti-hypertensive medications. Refills not given and routine lab work will be done today. Recommended routine exercise and healthy diet including DASH diet and mediterranean diet. Encouraged weight loss. F/u in 6 months.   Hyperlipidemia/CAD/CVA: discussed with her she needs to start her statin back. Discussed side effects of statin, but she has been tolerating fine and with history of major stroke, needs to stay on this medication. Will check lipid panel today and she will start this back up.   Abnormal blood sugar Numerous high readings on records. Most recent was 193. likely has diabetes. Checking a1c.   Obesity -do not agree with phentermine in an 83 year old with multiple co morbidities. I am not prescribing physician and discussed this with her.    No future appointments. Return in about 8 months (around 09/26/2019) for htn follow up .   Lab/Order associations: Essential hypertension - Plan: CBC  with Differential/Platelet, Comprehensive metabolic panel, Microalbumin / creatinine urine ratio  Medicare annual wellness visit, subsequent  Mixed hyperlipidemia - Plan: Lipid panel  Abnormal blood sugar -  Plan: Hemoglobin A1c  No orders of the defined types were placed in this encounter.   Return precautions advised. Orma Flaming, MD

## 2019-01-27 ENCOUNTER — Other Ambulatory Visit: Payer: Self-pay | Admitting: Family Medicine

## 2019-01-27 ENCOUNTER — Encounter: Payer: Self-pay | Admitting: Family Medicine

## 2019-01-27 DIAGNOSIS — E119 Type 2 diabetes mellitus without complications: Secondary | ICD-10-CM | POA: Insufficient documentation

## 2019-02-01 ENCOUNTER — Other Ambulatory Visit: Payer: Self-pay | Admitting: Family Medicine

## 2019-02-01 DIAGNOSIS — Z1231 Encounter for screening mammogram for malignant neoplasm of breast: Secondary | ICD-10-CM

## 2019-02-16 ENCOUNTER — Other Ambulatory Visit: Payer: Self-pay | Admitting: Family Medicine

## 2019-02-16 MED ORDER — PRAMIPEXOLE DIHYDROCHLORIDE 0.25 MG PO TABS: 0.2500 mg | ORAL_TABLET | Freq: Every day | ORAL | 1 refills | Status: DC

## 2019-02-16 NOTE — Telephone Encounter (Signed)
See note

## 2019-02-16 NOTE — Telephone Encounter (Signed)
This medication is on the patient's list, by a historical provider and is not addressed in the last OV note by physician.

## 2019-02-16 NOTE — Telephone Encounter (Signed)
Copied from [place] [id number]. Topic: Quick Communication - Rx Refill/Question >> Feb 16, 2019 12:48 PM Mcneil, Ja-Kwan wrote: Medication: pramipexole (MIRAPEX) 0.25 MG tablet  Has the patient contacted their pharmacy? yes   Preferred Pharmacy (with phone number or street name): [facility] [address], [place] - [place] [phone] (Phone)  [phone] (Fax)  Agent: Please be advised that RX refills may take up to 3 business days. We ask that you follow-up with your pharmacy.

## 2019-03-08 ENCOUNTER — Other Ambulatory Visit: Payer: Self-pay | Admitting: Family Medicine

## 2019-03-08 MED ORDER — CLOPIDOGREL BISULFATE 75 MG PO TABS: 75.0000 mg | ORAL_TABLET | ORAL | 2 refills | Status: DC

## 2019-03-08 NOTE — Telephone Encounter (Signed)
Copied from [place] [phone]. Topic: Quick Communication - Rx Refill/Question >> Mar 08, 2019 10:11 AM Bea Graff, NT wrote: Medication: clopidogrel (PLAVIX) 75 MG tablet   Has the patient contacted their pharmacy? Yes.   (Agent: If no, request that the patient contact the pharmacy for the refill.) (Agent: If yes, when and what did the pharmacy advise?)  Preferred Pharmacy (with phone number or street name): [facility] [address] [place] - [place] [phone] (Phone) [phone] (Fax)    Agent: Please be advised that RX refills may take up to 3 business days. We ask that you follow-up with your pharmacy.

## 2019-03-08 NOTE — Telephone Encounter (Signed)
Requested medication (s) are due for refill today: yes  Requested medication (s) are on the active medication list: yes as a historical medication  Last refill:  11/03/11  Future visit scheduled: yes  Notes to clinic:  Historical medication and provider   Requested Prescriptions  Pending Prescriptions Disp Refills   clopidogrel (PLAVIX) 75 MG tablet      Sig: Take 1 tablet (75 mg total) by mouth every other day.     Hematology: Antiplatelets - clopidogrel Failed - 03/08/2019 10:38 AM      Failed - Evaluate AST, ALT within 2 months of therapy initiation.      Passed - ALT in normal range and within 360 days    ALT  Date Value Ref Range Status  01/25/2019 16 0 - 35 U/L Final         Passed - AST in normal range and within 360 days    AST  Date Value Ref Range Status  01/25/2019 17 0 - 37 U/L Final         Passed - HCT in normal range and within 180 days    HCT  Date Value Ref Range Status  01/25/2019 43.1 36.0 - 46.0 % Final         Passed - HGB in normal range and within 180 days    Hemoglobin  Date Value Ref Range Status  01/25/2019 14.5 12.0 - 15.0 g/dL Final         Passed - PLT in normal range and within 180 days    Platelets  Date Value Ref Range Status  01/25/2019 220.0 150.0 - 400.0 K/uL Final         Passed - Valid encounter within last 6 months    Recent Outpatient Visits          1 month ago Essential hypertension   [facility], Ebony Hail, MD      Future Appointments            In 1 month Orma Flaming, MD [place], [place]   In 6 months Orma Flaming, MD [place], [facility]

## 2019-03-08 NOTE — Telephone Encounter (Signed)
See note

## 2019-03-15 ENCOUNTER — Ambulatory Visit
Admission: RE | Admit: 2019-03-15 | Discharge: 2019-03-15 | Disposition: A | Discharge status: Home / Self Care | Payer: Medicare HMO | Source: Ambulatory Visit | Attending: Family Medicine | Admitting: Family Medicine

## 2019-03-15 ENCOUNTER — Other Ambulatory Visit: Payer: Self-pay

## 2019-03-15 DIAGNOSIS — Z1231 Encounter for screening mammogram for malignant neoplasm of breast: Secondary | ICD-10-CM

## 2019-03-28 DIAGNOSIS — H209 Unspecified iridocyclitis: Secondary | ICD-10-CM | POA: Diagnosis not present

## 2019-03-28 DIAGNOSIS — Z961 Presence of intraocular lens: Secondary | ICD-10-CM | POA: Diagnosis not present

## 2019-03-28 DIAGNOSIS — H4042X2 Glaucoma secondary to eye inflammation, left eye, moderate stage: Secondary | ICD-10-CM | POA: Diagnosis not present

## 2019-03-28 DIAGNOSIS — H35033 Hypertensive retinopathy, bilateral: Secondary | ICD-10-CM | POA: Diagnosis not present

## 2019-04-12 ENCOUNTER — Telehealth: Payer: Self-pay | Admitting: Family Medicine

## 2019-04-12 NOTE — Telephone Encounter (Signed)
Spoke to patient.  She will keep her appt for both 4/24 for fasting labs and on 4/29 w/Dr. Rogers Blocker.

## 2019-04-12 NOTE — Telephone Encounter (Signed)
Copied from [place] [id number]. Topic: Appointment Scheduling - Scheduling Inquiry for Clinic >> Apr 12, 2019 11:32 AM Margot Ables wrote: Reason for CRM: Pt called to see if she should still come to office for lab work 4/24 and OV 4/29. Advised pt she will receive return call.

## 2019-04-12 NOTE — Telephone Encounter (Signed)
Yes that is fine with me

## 2019-04-13 DIAGNOSIS — M7989 Other specified soft tissue disorders: Secondary | ICD-10-CM | POA: Diagnosis not present

## 2019-04-13 DIAGNOSIS — M2550 Pain in unspecified joint: Secondary | ICD-10-CM | POA: Diagnosis not present

## 2019-04-13 DIAGNOSIS — R600 Localized edema: Secondary | ICD-10-CM | POA: Diagnosis not present

## 2019-04-13 DIAGNOSIS — R0602 Shortness of breath: Secondary | ICD-10-CM | POA: Diagnosis not present

## 2019-04-13 DIAGNOSIS — M5416 Radiculopathy, lumbar region: Secondary | ICD-10-CM | POA: Diagnosis not present

## 2019-04-13 DIAGNOSIS — H209 Unspecified iridocyclitis: Secondary | ICD-10-CM | POA: Diagnosis not present

## 2019-04-13 DIAGNOSIS — M5441 Lumbago with sciatica, right side: Secondary | ICD-10-CM | POA: Diagnosis not present

## 2019-04-21 ENCOUNTER — Encounter: Payer: Self-pay | Admitting: Family Medicine

## 2019-04-21 ENCOUNTER — Other Ambulatory Visit: Payer: Self-pay

## 2019-04-21 ENCOUNTER — Other Ambulatory Visit (INDEPENDENT_AMBULATORY_CARE_PROVIDER_SITE_OTHER): Payer: Medicare HMO

## 2019-04-21 DIAGNOSIS — E119 Type 2 diabetes mellitus without complications: Secondary | ICD-10-CM | POA: Diagnosis not present

## 2019-04-21 DIAGNOSIS — M2550 Pain in unspecified joint: Secondary | ICD-10-CM | POA: Insufficient documentation

## 2019-04-21 LAB — LIPID PANEL
Cholesterol: 175 mg/dL (ref 0–200)
HDL: 46.9 mg/dL (ref >39.00)
LDL Cholesterol: 100 mg/dL — ABNORMAL HIGH (ref 0–99)
NonHDL: 128.01
Total CHOL/HDL Ratio: 4
Triglycerides: 141 mg/dL (ref 0.0–149.0)
VLDL: 28.2 mg/dL (ref 0.0–40.0)

## 2019-04-21 LAB — HEMOGLOBIN A1C: Hgb A1c MFr Bld: 7.4 % — ABNORMAL HIGH (ref 4.6–6.5)

## 2019-04-26 ENCOUNTER — Encounter: Payer: Self-pay | Admitting: Family Medicine

## 2019-04-26 ENCOUNTER — Ambulatory Visit (INDEPENDENT_AMBULATORY_CARE_PROVIDER_SITE_OTHER): Payer: Medicare HMO | Admitting: Family Medicine

## 2019-04-26 ENCOUNTER — Other Ambulatory Visit: Payer: Self-pay

## 2019-04-26 VITALS — BP 128/76 | HR 65 | Temp 98.2°F | Ht 62.0 in | Wt 174.2 lb

## 2019-04-26 DIAGNOSIS — E782 Mixed hyperlipidemia: Secondary | ICD-10-CM

## 2019-04-26 DIAGNOSIS — R600 Localized edema: Secondary | ICD-10-CM

## 2019-04-26 DIAGNOSIS — E119 Type 2 diabetes mellitus without complications: Secondary | ICD-10-CM | POA: Diagnosis not present

## 2019-04-26 MED ORDER — POTASSIUM CHLORIDE CRYS ER 20 MEQ PO TBCR: 20.0000 meq | EXTENDED_RELEASE_TABLET | Freq: Every day | ORAL | 3 refills | Status: DC

## 2019-04-26 MED ORDER — TRAMADOL HCL 50 MG PO TABS: 50.0000 mg | ORAL_TABLET | Freq: Two times a day (BID) | ORAL | 1 refills | Status: DC | PRN

## 2019-04-26 NOTE — Progress Notes (Signed)
Patient: Cheryl Wood MRN: [id number] DOB: 1936/10/13 PCP: Orma Flaming, MD     Subjective:  Chief Complaint  Patient presents with  . Follow-up    HPI: The patient is a 83 y.o. female who presents today for follow up of diabetes and hyperlipidemia. We restarted her statin at last visit. Her a1c was 7.0 and we gave her 3 months to work on diet.   Diabetes: Patient is here for follow up of type 2 diabetes. First diagnosed 2019, but likely has had longer. Currently on the following medications none.  Last A1C was 7.0. Currently exercising and following diabetic diet.   Denies any hypoglycemic events. Denies any vision changes, nausea, vomiting, abdominal pain, ulcers/paraesthesia in feet, polyuria, polydipsia or polyphagia. Denies any chest pain, shortness of breath. Her a1c went from 7.0 to 7.4, but she has cut out most bread and potatoes, doesn't drink any sugary drinks. She does eat a lot of fruit. She has been walking in her building. The concrete outside hurts her hips a lot.   Hyperlipidemia: hx of CVA and had stopped her statin due to her friend telling her some bogus information when I saw her on last visit. Discussed I wanted her to start this back up and we checked labs. Will review those here today. Tolerating medication with no issues.   Lower leg edema: She has long hx of lower leg edema. Her previous PCP put her on 80mg  of lasix BID that she has been on for "years." She has no hx of liver, kidney or heart failure. She does have nonrheumatic aortic valve insufficiency, but had normal echo/cath done last year. EF normal with normal pulmonary artery pressures. Also had [place] lower extremities done. She feels like the edema is worse in her feet. She has not worn compression hose. Not elevating legs.     Review of Systems  Constitutional: Negative for chills, fatigue and fever.  HENT: Negative for dental problem, ear pain, hearing loss and trouble swallowing.   Eyes: Negative for  visual disturbance.  Respiratory: Negative for cough, chest tightness and shortness of breath.   Cardiovascular: Positive for leg swelling. Negative for chest pain and palpitations.  Gastrointestinal: Negative for abdominal pain, blood in stool, diarrhea and nausea.  Endocrine: Negative for cold intolerance, polydipsia, polyphagia and polyuria.  Genitourinary: Negative for dysuria and hematuria.  Musculoskeletal: Negative for arthralgias.  Skin: Negative for rash.  Neurological: Negative for dizziness and headaches.  Psychiatric/Behavioral: Negative for dysphoric mood and sleep disturbance. The patient is not nervous/anxious.     Allergies Patient is allergic to tetanus toxoids and hctz [hydrochlorothiazide].  Past Medical History Patient  has a past medical history of Allergy, Arthritis, Bleeding of blood vessel, Blood in stool, Chronic kidney disease, Depression, Glaucoma, Heart murmur, History of chicken pox, History of recurrent UTIs, History of stomach ulcers, Hyperlipidemia, Hypertension, Memory loss, Rheumatic fever, Seizures ([place]), Stroke ([place]), TIA (transient ischemic attack) (10/22/2013), and Urine incontinence.  Surgical History Patient  has a past surgical history that includes Abdominal hysterectomy; gum transplant; Cataract extraction (Right, May 13, 2014); RIGHT/LEFT HEART CATH AND CORONARY ANGIOGRAPHY (N/A, 05/12/2018); THORACIC AORTOGRAM (N/A, 05/12/2018); Eye surgery (12/13/2018); Appendectomy (1983); and Tonsillectomy.  Family History Pateint's family history includes Alcohol abuse in her maternal grandfather, paternal grandfather, and sister; Arthritis in her brother, father, paternal grandmother, and sister; COPD in her sister; Cancer in her mother and sister; Diabetes in her brother and daughter; Drug abuse in her sister; Early death in her brother, brother,  father, maternal grandfather, mother, and sister; Hearing loss in her father; Heart attack in her brother; Heart  disease in her brother, daughter, father, paternal grandfather, and sister; Hyperlipidemia in her brother, daughter, father, maternal grandmother, paternal grandfather, and sister; Hypertension in her brother, father, maternal grandmother, paternal grandfather, and sister; Stroke in her maternal grandmother and paternal grandfather.  Social History Patient  reports that she quit smoking about 47 years ago. Her smoking use included cigarettes. She quit after 40.00 years of use. She has never used smokeless tobacco. She reports that she does not drink alcohol or use drugs.    Objective: Vitals:   04/26/19 0814  BP: 128/76  Pulse: 65  Temp: 98.2 F (36.8 C)  TempSrc: Oral  SpO2: 95%  Weight: 174 lb 3.2 oz (79 kg)  Height: 5\' 2"  (1.575 m)    Body mass index is 31.86 kg/m.  Physical Exam Vitals signs reviewed.  Constitutional:      Appearance: Normal appearance.  Neck:     Musculoskeletal: Normal range of motion and neck supple.  Cardiovascular:     Rate and Rhythm: Normal rate and regular rhythm.     Heart sounds: Murmur present.     Comments: +lower leg edema to above ankle.  Pulmonary:     Effort: Pulmonary effort is normal.     Breath sounds: Normal breath sounds.  Abdominal:     General: Abdomen is flat. Bowel sounds are normal.     Palpations: Abdomen is soft.  Skin:    General: Skin is warm and dry.  Neurological:     General: No focal deficit present.     Mental Status: She is alert and oriented to person, place, and time.        Assessment/plan: 1. Diabetes mellitus without complication ([place]) Slightly worse a1c from 7.0-7.4 She is 83 years of age and I do not want her tightly controlled, but discussed goal would be around 7.0 to where she is now. I think diet is a big issue for her. Before we start medication, we are going to send her to see nutritionist for virtual visit and make sure she is eating appropriate foods. Will give her 3 months to make changes to her  diet then see her back for labs/follow up appointment. Continue exercising as best she can with joint pain and covid. Ask about pnueumovax. Unsure when she received this.  - Comprehensive metabolic panel; Future - CBC with Differential/Platelet; Future - TSH; Future - Hemoglobin A1c; Future  2. Mixed hyperlipidemia Continue statin. Tolerating well and cholesterol numbers to goal now. Discussed needs to be on with CVA and diabetes hx.   3. Lower leg edema Likely venous insufficiency. Labs just done by rheumatology. Requesting these just to check renal function.  Long standing history of this. Discussed I want her to get compression hose, elevate legs and do conservative therapy until I see her back. If no improvement and she feels like it's worsening we can send to vein specialist vs. Cardiology, but getting treatment for venous insufficiency is tricky. Would still be worth consults. Will address at f/u. Precautions given. I think she is on a massive dose of lasix, but has been on this for years and tolerating well. Will not take her off at this point.     Return in about 5 months (around 09/26/2019) for f/u of htn, diabetes,leg swelling .     Orma Flaming, MD [place]  04/26/2019

## 2019-04-26 NOTE — Patient Instructions (Addendum)
We will see you back in 5 months. Labs before: can come in 9/28 or after then schedule appointment after this. Do not have to be fasting.   Get compression hose and wear daily. Elevate legs and work on weight loss. If not better in 5 months will send to vein specialist. I think you have venous insufficiency.   Diabetes: getting you set up with samantha to go over nutrition.   [email or web address] is great website as well.

## 2019-05-03 ENCOUNTER — Encounter: Payer: Self-pay | Admitting: Physician Assistant

## 2019-05-03 ENCOUNTER — Ambulatory Visit (INDEPENDENT_AMBULATORY_CARE_PROVIDER_SITE_OTHER): Payer: Medicare HMO | Admitting: Physician Assistant

## 2019-05-03 VITALS — Ht 62.0 in | Wt 172.0 lb

## 2019-05-03 DIAGNOSIS — E119 Type 2 diabetes mellitus without complications: Secondary | ICD-10-CM

## 2019-05-03 DIAGNOSIS — Z713 Dietary counseling and surveillance: Secondary | ICD-10-CM

## 2019-05-03 NOTE — Progress Notes (Addendum)
Virtual Visit via Video   I connected with Cheryl Wood on 05/03/19 at 10:00 AM EDT by a video enabled telemedicine application and verified that I am speaking with the correct person using two identifiers. Location patient: Home Location provider: [facility], Office Persons participating in the virtual visit: [facility], Inda Coke, [place], Anselmo Pickler, LPN  I discussed the limitations of evaluation and management by telemedicine and the availability of in person appointments. The patient expressed understanding and agreed to proceed.  I Anselmo Pickler, LPN acted as a Education administrator for [employer], [employer].  Subjective:   HPI:  Nutrition Counseling Pt wanting to discuss Nutrition and Diet today. She would like to lose 17-20 pounds and learn about healthy eating.  Lab Results  Component Value Date   HGBA1C 7.4 (H) 04/21/2019   Dietary recall: Wakes up at 6-7am Breakfast -- NONE Lunch -- NONE Dinner -- chicken or pork, vegetables (green beans, asparagus, broccoli, sweet potatoes, peas), slice of bread (occasional) Snacks -- cup of canned fruit in "100% fruit juice" and 1 cup fat free or low fat cottage cheese x 2 a day Beverages -- at least 56 oz water, 1 cup of coffee daily with creamer only; "milk shake" -- whole milk, cocoa mix (18 grams CHO)  Weight: Wt Readings from Last 3 Encounters:  05/03/19 172 lb (78 kg)  04/26/19 174 lb 3.2 oz (79 kg)  01/25/19 168 lb (76.2 kg)    Exercise: None, lives in an apartment by the [place] by hip pain, scoliosis   Support system: Two daughters, one in [place], getting married soon   Goals: 1- reverse diabetes 2- avoid DM medications 3- lose weight   Estimated daily energy needs: Calories: 1400-1600 kcal Protein: 60-70 g Fluid: >1800 ml   ROS: See pertinent positives and negatives per HPI.  Patient Active Problem List   Diagnosis Date Noted  . Polyarthralgia 04/21/2019  . Diabetes mellitus without  complication ([place]) 66/05/3015  . CVA (cerebral vascular accident) ([place]) 01/25/2019  . RLS (restless legs syndrome) 01/25/2019  . Glaucoma, left eye 01/25/2019  . Glaucoma associated with ocular inflammation, left, 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  . Nonrheumatic aortic valve insufficiency   . CAD in native artery   . HTN (hypertension) 10/22/2013  . Hyperlipidemia 10/22/2013    Social History   Tobacco Use  . Smoking status: Former Smoker    Years: 40.00    Types: Cigarettes    Last attempt to quit: 11/05/1971    Years since quitting: 47.5  . Smokeless tobacco: Never Used  Substance Use Topics  . Alcohol use: No    Alcohol/week: 0.0 standard drinks    Current Outpatient Medications:  .  amoxicillin (AMOXIL) 500 MG capsule, Take 500 mg by mouth 4 (four) times daily. Use prior to dental appointment, Disp: , Rfl: 0 .  clopidogrel (PLAVIX) 75 MG tablet, Take 1 tablet (75 mg total) by mouth every other day., Disp: 30 tablet, Rfl: 2 .  dorzolamide-timolol (COSOPT) 22.3-6.8 MG/ML ophthalmic solution, Place 1 drop into the left eye daily., Disp: , Rfl:  .  famciclovir (FAMVIR) 500 MG tablet, Take 500 mg by mouth daily. , Disp: , Rfl:  .  famotidine (PEPCID) 20 MG tablet, Take 20 mg by mouth 2 (two) times daily. , Disp: , Rfl:  .  furosemide (LASIX) 80 MG tablet, Take 160 mg by mouth daily. , Disp: , Rfl:  .  loratadine (CLARITIN) 10 MG tablet, Take 10 mg by  mouth daily., Disp: , Rfl:  .  losartan (COZAAR) 100 MG tablet, Take 100 mg by mouth daily. , Disp: , Rfl:  .  lovastatin (MEVACOR) 20 MG tablet, Take 20 mg by mouth every evening., Disp: , Rfl: 3 .  potassium chloride SA (KLOR-CON M20) 20 MEQ tablet, Take 1 tablet (20 mEq total) by mouth daily., Disp: 90 tablet, Rfl: 3 .  pramipexole (MIRAPEX) 0.25 MG tablet, Take 1 tablet (0.25 mg total) by mouth at bedtime., Disp: 90 tablet, Rfl: 1 .  traMADol (ULTRAM) 50 MG tablet, Take 1 tablet (50 mg total) by mouth 2 (two) times daily as needed for  moderate pain. Max: no more than 2 pills/day, Disp: 60 tablet, Rfl: 1  Allergies  Allergen Reactions  . Tetanus Toxoids Swelling    Arm was twice the size it should be  . Hctz [Hydrochlorothiazide] Rash    Objective:   VITALS: Per patient if applicable, see vitals. GENERAL: Alert, appears well and in no acute distress. HEENT: Atraumatic, conjunctiva clear, no obvious abnormalities on inspection of external nose and ears. NECK: Normal movements of the head and neck. CARDIOPULMONARY: No increased WOB. Speaking in clear sentences. I:E ratio WNL.  MS: Moves all visible extremities without noticeable abnormality. PSYCH: Pleasant and cooperative, well-groomed. Speech normal rate and rhythm. Affect is appropriate. Insight and judgement are appropriate. Attention is focused, linear, and appropriate.  NEURO: CN grossly intact. Oriented as arrived to appointment on time with no prompting. Moves both UE equally.  SKIN: No obvious lesions, wounds, erythema, or cyanosis noted on face or hands.  Assessment and Plan:   Aretta was seen today for nutrition counseling.  Diagnoses and all orders for this visit:  Encounter for nutritional counseling  Diabetes mellitus without complication ([place])   Discussed specific, individualized recommendations regarding nutrition including decreasing sugary beverages, limiting portions, balancing out meals, and eating regularly throughout the day. Handouts provided included: Balanced Plate, Balanced Snack List, Balanced Breakfast, 1600 Calorie Sample Menus. Provided emotional support and encouraged slow, steady weight loss. Patient's questions answered throughout encounter. Follow-up with me prn.  Specific recommendations include:  1. Try to add a small balanced meal or snack between 10a-12p.  2. Please monitor your portions of carbohydrates (you can refer to the multiple page handout provided.) Carbohydrates include: dairy products, starchy vegetables  (potatoes, corn, peas), beans, fruits, desserts, breads, pastas.  3. Continue to drink water daily -- you are doing great with this!  4. Try a lower fat milk.  . Reviewed expectations re: course of current medical issues. . Discussed self-management of symptoms. . Outlined signs and symptoms indicating need for more acute intervention. . Patient verbalized understanding and all questions were answered. Marland Kitchen Health Maintenance issues including appropriate healthy diet, exercise, and smoking avoidance were discussed with patient. . See orders for this visit as documented in the electronic medical record.  I discussed the assessment and treatment plan with the patient. The patient was provided an opportunity to ask questions and all were answered. The patient agreed with the plan and demonstrated an understanding of the instructions.   The patient was advised to call back or seek an in-person evaluation if the symptoms worsen or if the condition fails to improve as anticipated.   CMA or LPN served as scribe during this visit. History, Physical, and Plan performed by medical provider. The above documentation has been reviewed and is accurate and complete.  I spent 40 minutes with this patient, greater than 50% was face-to-face time  counseling regarding the above diagnoses.  [place], [place] 05/03/2019

## 2019-05-03 NOTE — Patient Instructions (Addendum)
It was great talking to you on 05/03/2019!  Recommendations from our discussion:  1. Try to add a small balanced meal or snack between 10a-12p.  2. Please monitor your portions of carbohydrates (you can refer to the multiple page handout provided.) Carbohydrates include: dairy products, starchy vegetables (potatoes, corn, peas), beans, fruits, desserts, breads, pastas.  3. Continue to drink water daily -- you are doing great with this!  4. Try a lower fat milk.  Let us know if you have any questions about the handouts.  Shanira Tine+

## 2019-06-06 DIAGNOSIS — M25432 Effusion, left wrist: Secondary | ICD-10-CM | POA: Diagnosis not present

## 2019-06-06 DIAGNOSIS — M5416 Radiculopathy, lumbar region: Secondary | ICD-10-CM | POA: Diagnosis not present

## 2019-06-06 DIAGNOSIS — R5383 Other fatigue: Secondary | ICD-10-CM | POA: Diagnosis not present

## 2019-06-06 DIAGNOSIS — Z6830 Body mass index (BMI) 30.0-30.9, adult: Secondary | ICD-10-CM | POA: Diagnosis not present

## 2019-06-06 DIAGNOSIS — M7989 Other specified soft tissue disorders: Secondary | ICD-10-CM | POA: Diagnosis not present

## 2019-06-06 DIAGNOSIS — E669 Obesity, unspecified: Secondary | ICD-10-CM | POA: Diagnosis not present

## 2019-06-06 DIAGNOSIS — H209 Unspecified iridocyclitis: Secondary | ICD-10-CM | POA: Diagnosis not present

## 2019-06-06 DIAGNOSIS — M1990 Unspecified osteoarthritis, unspecified site: Secondary | ICD-10-CM | POA: Diagnosis not present

## 2019-06-06 DIAGNOSIS — R0602 Shortness of breath: Secondary | ICD-10-CM | POA: Diagnosis not present

## 2019-06-06 DIAGNOSIS — R600 Localized edema: Secondary | ICD-10-CM | POA: Diagnosis not present

## 2019-06-06 DIAGNOSIS — M5441 Lumbago with sciatica, right side: Secondary | ICD-10-CM | POA: Diagnosis not present

## 2019-06-08 DIAGNOSIS — D485 Neoplasm of uncertain behavior of skin: Secondary | ICD-10-CM | POA: Diagnosis not present

## 2019-06-08 DIAGNOSIS — Q828 Other specified congenital malformations of skin: Secondary | ICD-10-CM | POA: Diagnosis not present

## 2019-06-08 DIAGNOSIS — Z85828 Personal history of other malignant neoplasm of skin: Secondary | ICD-10-CM | POA: Diagnosis not present

## 2019-06-08 DIAGNOSIS — D1801 Hemangioma of skin and subcutaneous tissue: Secondary | ICD-10-CM | POA: Diagnosis not present

## 2019-06-08 DIAGNOSIS — D045 Carcinoma in situ of skin of trunk: Secondary | ICD-10-CM | POA: Diagnosis not present

## 2019-06-08 DIAGNOSIS — L821 Other seborrheic keratosis: Secondary | ICD-10-CM | POA: Diagnosis not present

## 2019-06-08 DIAGNOSIS — L578 Other skin changes due to chronic exposure to nonionizing radiation: Secondary | ICD-10-CM | POA: Diagnosis not present

## 2019-06-26 ENCOUNTER — Other Ambulatory Visit: Payer: Self-pay | Admitting: Family Medicine

## 2019-06-27 DIAGNOSIS — M0600 Rheumatoid arthritis without rheumatoid factor, unspecified site: Secondary | ICD-10-CM | POA: Diagnosis not present

## 2019-06-30 ENCOUNTER — Telehealth: Payer: Self-pay | Admitting: Family Medicine

## 2019-06-30 NOTE — Telephone Encounter (Signed)
Medication: lovastatin (MEVACOR) 20 MG tablet [[id number]]   Has the patient contacted their pharmacy? Yes  (Agent: If no, request that the patient contact the pharmacy for the refill.) (Agent: If yes, when and what did the pharmacy advise?)  Preferred Pharmacy (with phone number or street name): [facility] [address], [place] - [place] [phone] (Phone) [phone] (Fax)    Agent: Please be advised that RX refills may take up to 3 business days. We ask that you follow-up with your pharmacy.

## 2019-07-03 ENCOUNTER — Other Ambulatory Visit: Payer: Self-pay

## 2019-07-03 MED ORDER — LOVASTATIN 20 MG PO TABS: 20.0000 mg | ORAL_TABLET | Freq: Every evening | ORAL | 1 refills | Status: DC

## 2019-07-03 NOTE — Telephone Encounter (Signed)
Rx refill sent in for Mevacor to [facility] [address]

## 2019-07-03 NOTE — Telephone Encounter (Signed)
See note

## 2019-07-04 DIAGNOSIS — D045 Carcinoma in situ of skin of trunk: Secondary | ICD-10-CM | POA: Diagnosis not present

## 2019-07-11 DIAGNOSIS — M0600 Rheumatoid arthritis without rheumatoid factor, unspecified site: Secondary | ICD-10-CM | POA: Diagnosis not present

## 2019-07-17 ENCOUNTER — Telehealth: Payer: Self-pay | Admitting: Family Medicine

## 2019-07-17 NOTE — Telephone Encounter (Signed)
Medication Refill - Medication: famotidine (PEPCID) 20 MG tablet    Has the patient contacted their pharmacy? No. Pt states she no longer sees the provider who originally prescribed this. Please advise.  (Agent: If no, request that the patient contact the pharmacy for the refill.) (Agent: If yes, when and what did the pharmacy advise?)  Preferred Pharmacy (with phone number or street name):  [facility] [address] [place] - [place] [address]  Phone: [phone] Fax: [phone]  Not a 24 hour pharmacy; exact hours not known.     Agent: Please be advised that RX refills may take up to 3 business days. We ask that you follow-up with your pharmacy.

## 2019-07-18 MED ORDER — FAMOTIDINE 20 MG PO TABS: 20.0000 mg | ORAL_TABLET | Freq: Two times a day (BID) | ORAL | 3 refills | Status: DC

## 2019-07-20 DIAGNOSIS — H4042X2 Glaucoma secondary to eye inflammation, left eye, moderate stage: Secondary | ICD-10-CM | POA: Diagnosis not present

## 2019-07-28 DIAGNOSIS — H209 Unspecified iridocyclitis: Secondary | ICD-10-CM | POA: Diagnosis not present

## 2019-07-28 DIAGNOSIS — Z961 Presence of intraocular lens: Secondary | ICD-10-CM | POA: Diagnosis not present

## 2019-07-28 DIAGNOSIS — H35033 Hypertensive retinopathy, bilateral: Secondary | ICD-10-CM | POA: Diagnosis not present

## 2019-07-28 DIAGNOSIS — H4042X2 Glaucoma secondary to eye inflammation, left eye, moderate stage: Secondary | ICD-10-CM | POA: Diagnosis not present

## 2019-08-08 DIAGNOSIS — M0600 Rheumatoid arthritis without rheumatoid factor, unspecified site: Secondary | ICD-10-CM | POA: Diagnosis not present

## 2019-08-16 ENCOUNTER — Other Ambulatory Visit: Payer: Self-pay | Admitting: Family Medicine

## 2019-08-16 DIAGNOSIS — H5032 Intermittent alternating esotropia: Secondary | ICD-10-CM | POA: Diagnosis not present

## 2019-08-26 ENCOUNTER — Other Ambulatory Visit: Payer: Self-pay | Admitting: Family Medicine

## 2019-09-11 DIAGNOSIS — M180 Bilateral primary osteoarthritis of first carpometacarpal joints: Secondary | ICD-10-CM | POA: Diagnosis not present

## 2019-09-11 DIAGNOSIS — M5416 Radiculopathy, lumbar region: Secondary | ICD-10-CM | POA: Diagnosis not present

## 2019-09-11 DIAGNOSIS — R5383 Other fatigue: Secondary | ICD-10-CM | POA: Diagnosis not present

## 2019-09-11 DIAGNOSIS — H209 Unspecified iridocyclitis: Secondary | ICD-10-CM | POA: Diagnosis not present

## 2019-09-11 DIAGNOSIS — M1990 Unspecified osteoarthritis, unspecified site: Secondary | ICD-10-CM | POA: Diagnosis not present

## 2019-09-11 DIAGNOSIS — R600 Localized edema: Secondary | ICD-10-CM | POA: Diagnosis not present

## 2019-09-11 DIAGNOSIS — M25432 Effusion, left wrist: Secondary | ICD-10-CM | POA: Diagnosis not present

## 2019-09-11 DIAGNOSIS — E669 Obesity, unspecified: Secondary | ICD-10-CM | POA: Diagnosis not present

## 2019-09-11 DIAGNOSIS — G629 Polyneuropathy, unspecified: Secondary | ICD-10-CM | POA: Diagnosis not present

## 2019-09-11 DIAGNOSIS — Z6830 Body mass index (BMI) 30.0-30.9, adult: Secondary | ICD-10-CM | POA: Diagnosis not present

## 2019-09-27 NOTE — Progress Notes (Signed)
Patient: Cheryl Wood MRN: [id number] DOB: 02/19/1936 PCP: Orma Flaming, MD     Subjective:  Chief Complaint  Patient presents with  . Hypertension  . Diabetes    HPI: The patient is a 83 y.o. female who presents today for hypertension, diabetes and depression follow up.   Hypertension: Here for follow up of hypertension.  Currently on cozaar and lasix.  Takes medication as prescribed and denies any side effects. Exercise includes stationary bike. Weight has been stable. Denies any chest pain, headaches, shortness of breath, vision changes, swelling in lower extremities.   Diabetes: Patient is here for follow up of type 2 diabetes. First diagnosed 2019.  Currently on the following medications none. Takes medications as prescribed. Last A1C was 7.4. Currently exercising and following diabetic diet. . Denies any hypoglycemic events. Denies any vision changes, nausea, vomiting, abdominal pain, ulcers/paraesthesia in feet, polyuria, polydipsia or polyphagia. Denies any chest pain, shortness of breath. She followed up with nutrition and I was going to give her 3 months to work on diet/exericse. Rechecking labs today. She has started to go down to her gym in apartment and is riding a bike.    Flu shot today.   She also wants to know about colon cancer screening and feels like she needs something else done. She has never had an abnormal colonoscopy and called GI and they told her she was past the age for screens. She is interested in cologaurd.   Depression: this is new for her. She states it's secondary to state of world, pandemic, etc. She is very sad and tearful and doesn't feel like she has a purpose. she denies any SI/HI/AH/VH. She states her family thinks she needs medication.   Review of Systems  Constitutional: Negative for chills, fatigue and fever.  HENT: Negative for congestion, dental problem, ear pain, hearing loss, postnasal drip, rhinorrhea, sore throat and trouble swallowing.    Eyes: Negative for visual disturbance.  Respiratory: Positive for shortness of breath. Negative for cough and chest tightness.   Cardiovascular: Negative for chest pain, palpitations and leg swelling.  Gastrointestinal: Negative for abdominal pain, blood in stool, diarrhea, nausea and vomiting.  Endocrine: Negative for cold intolerance, polydipsia, polyphagia and polyuria.  Genitourinary: Negative for dysuria and hematuria.  Musculoskeletal: Negative for arthralgias.  Skin: Negative for rash.  Neurological: Negative for dizziness and headaches.  Psychiatric/Behavioral: Positive for sleep disturbance. Negative for dysphoric mood, hallucinations and suicidal ideas. The patient is not nervous/anxious.     Allergies Patient is allergic to tetanus toxoids and hctz [hydrochlorothiazide].  Past Medical History Patient  has a past medical history of Allergy, Arthritis, Bleeding of blood vessel, Blood in stool, Chronic kidney disease, Depression, Glaucoma, Heart murmur, History of chicken pox, History of recurrent UTIs, History of stomach ulcers, Hyperlipidemia, Hypertension, Memory loss, Rheumatic fever, Seizures ([place]), Stroke ([place]), TIA (transient ischemic attack) (10/22/2013), and Urine incontinence.  Surgical History Patient  has a past surgical history that includes Abdominal hysterectomy; gum transplant; Cataract extraction (Right, May 13, 2014); RIGHT/LEFT HEART CATH AND CORONARY ANGIOGRAPHY (N/A, 05/12/2018); THORACIC AORTOGRAM (N/A, 05/12/2018); Eye surgery (12/13/2018); Appendectomy (1983); and Tonsillectomy.  Family History Pateint's family history includes Alcohol abuse in her maternal grandfather, paternal grandfather, and sister; Arthritis in her brother, father, paternal grandmother, and sister; COPD in her sister; Cancer in her mother and sister; Diabetes in her brother and daughter; Drug abuse in her sister; Early death in her brother, brother, father, maternal grandfather, mother, and  sister; Hearing loss in  her father; Heart attack in her brother; Heart disease in her brother, daughter, father, paternal grandfather, and sister; Hyperlipidemia in her brother, daughter, father, maternal grandmother, paternal grandfather, and sister; Hypertension in her brother, father, maternal grandmother, paternal grandfather, and sister; Stroke in her maternal grandmother and paternal grandfather.  Social History Patient  reports that she quit smoking about 47 years ago. Her smoking use included cigarettes. She quit after 40.00 years of use. She has never used smokeless tobacco. She reports that she does not drink alcohol or use drugs.    Objective: Vitals:   09/29/19 0800  BP: 132/84  Pulse: 83  Temp: 97.8 F (36.6 C)  TempSrc: Skin  SpO2: 94%  Weight: 174 lb 9.6 oz (79.2 kg)  Height: 5' 2"  (1.575 m)    Body mass index is 31.93 kg/m.  Physical Exam Vitals signs reviewed.  Constitutional:      Appearance: Normal appearance. She is well-developed.  HENT:     Right Ear: External ear normal.     Left Ear: External ear normal.     Ears:     Comments: Bilateral HA    Nose: Nose normal.     Mouth/Throat:     Mouth: Mucous membranes are moist.  Eyes:     Conjunctiva/sclera: Conjunctivae normal.     Pupils: Pupils are equal, round, and reactive to light.  Neck:     Musculoskeletal: Normal range of motion and neck supple.     Thyroid: No thyromegaly.  Cardiovascular:     Rate and Rhythm: Normal rate and regular rhythm.     Heart sounds: Murmur (harsh systolic ) present.  Pulmonary:     Effort: Pulmonary effort is normal.     Breath sounds: Normal breath sounds.  Abdominal:     General: Bowel sounds are normal. There is no distension.     Palpations: Abdomen is soft.     Tenderness: There is no abdominal tenderness.  Lymphadenopathy:     Cervical: No cervical adenopathy.  Skin:    General: Skin is warm and dry.     Findings: No rash.  Neurological:     Mental Status:  She is alert and oriented to person, place, and time.     Cranial Nerves: No cranial nerve deficit.     Coordination: Coordination normal.     Deep Tendon Reflexes: Reflexes normal.  Psychiatric:        Behavior: Behavior normal.    Depression screen PHQ 2/9 09/29/2019  Decreased Interest 3  Down, Depressed, Hopeless 3  PHQ - 2 Score 6  Altered sleeping 3  Tired, decreased energy 0  Change in appetite 3  Feeling bad or failure about yourself  0  Trouble concentrating 0  Moving slowly or fidgety/restless 1  Suicidal thoughts 0  PHQ-9 Score 13  Difficult doing work/chores Not difficult at all       Assessment/plan: 1. Essential hypertension Blood pressure is to goal. Continue current anti-hypertensive medications. Refills not needed. routine lab work will be done today. Recommended routine exercise and healthy diet including DASH diet and mediterranean diet. F/u in 6 months time.    2. Diabetes mellitus without complication ([place]) Has met with nutrition and made some changes in diet. At her age, hopefully we can keep her off medication. Will repeat today. Flu shot today. Eye exam utd. Asked that she look into her pnumovax shot. Had prevnar. Foot exam next visit. F/u in 3-6 months depending on lab work.  - Hemoglobin  A1c - TSH - CBC with Differential/Platelet - Comprehensive metabolic panel  3. Depression, major, single episode, mild (HCC) phq9 score mild, but clinically she is affected. Tearful during appointment and family really think she would benefit from medication. We are going to start with low dose zoloft at 51m. I've explained to her that drugs of the SSRI class can have side effects such as weight gain, sexual dysfunction, insomnia, headache, nausea and increased SI/HI. These medications are generally effective at alleviating symptoms of anxiety and/or depression. Let me know if significant side effects do occur. 911/ER if any suicidal thoughts. She will have close f/u  with me in one month or sooner if needed.    4. Need for immunization against influenza  - Flu Vaccine QUAD High Dose(Fluad)   Return in about 1 month (around 10/30/2019) for depression check up .    AOrma Flaming MD LSan German  09/29/2019

## 2019-09-28 ENCOUNTER — Ambulatory Visit: Payer: BC Managed Care – PPO | Admitting: Family Medicine

## 2019-09-29 ENCOUNTER — Encounter: Payer: Self-pay | Admitting: Family Medicine

## 2019-09-29 ENCOUNTER — Other Ambulatory Visit: Payer: Self-pay

## 2019-09-29 ENCOUNTER — Ambulatory Visit (INDEPENDENT_AMBULATORY_CARE_PROVIDER_SITE_OTHER): Payer: Medicare HMO | Admitting: Family Medicine

## 2019-09-29 VITALS — BP 132/84 | HR 83 | Temp 97.8°F | Ht 62.0 in | Wt 174.6 lb

## 2019-09-29 DIAGNOSIS — Z23 Encounter for immunization: Secondary | ICD-10-CM | POA: Diagnosis not present

## 2019-09-29 DIAGNOSIS — E119 Type 2 diabetes mellitus without complications: Secondary | ICD-10-CM | POA: Diagnosis not present

## 2019-09-29 DIAGNOSIS — F320 Major depressive disorder, single episode, mild: Secondary | ICD-10-CM | POA: Diagnosis not present

## 2019-09-29 DIAGNOSIS — R69 Illness, unspecified: Secondary | ICD-10-CM | POA: Diagnosis not present

## 2019-09-29 DIAGNOSIS — I10 Essential (primary) hypertension: Secondary | ICD-10-CM | POA: Diagnosis not present

## 2019-09-29 LAB — CBC WITH DIFFERENTIAL/PLATELET
Basophils Absolute: 0 10*3/uL (ref 0.0–0.1)
Basophils Relative: 0.4 % (ref 0.0–3.0)
Eosinophils Absolute: 0.2 10*3/uL (ref 0.0–0.7)
Eosinophils Relative: 2.6 % (ref 0.0–5.0)
HCT: 39.3 % (ref 36.0–46.0)
Hemoglobin: 13.4 g/dL (ref 12.0–15.0)
Lymphocytes Relative: 38.8 % (ref 12.0–46.0)
Lymphs Abs: 2.3 10*3/uL (ref 0.7–4.0)
MCHC: 34 g/dL (ref 30.0–36.0)
MCV: 93 fl (ref 78.0–100.0)
Monocytes Absolute: 0.7 10*3/uL (ref 0.1–1.0)
Monocytes Relative: 12.1 % — ABNORMAL HIGH (ref 3.0–12.0)
Neutro Abs: 2.7 10*3/uL (ref 1.4–7.7)
Neutrophils Relative %: 46.1 % (ref 43.0–77.0)
Platelets: 178 10*3/uL (ref 150.0–400.0)
RBC: 4.23 Mil/uL (ref 3.87–5.11)
RDW: 14.4 % (ref 11.5–15.5)
WBC: 6 10*3/uL (ref 4.0–10.5)

## 2019-09-29 LAB — COMPREHENSIVE METABOLIC PANEL
ALT: 16 U/L (ref 0–35)
AST: 15 U/L (ref 0–37)
Albumin: 4.4 g/dL (ref 3.5–5.2)
Alkaline Phosphatase: 68 U/L (ref 39–117)
BUN: 29 mg/dL — ABNORMAL HIGH (ref 6–23)
CO2: 29 mEq/L (ref 19–32)
Calcium: 10.1 mg/dL (ref 8.4–10.5)
Chloride: 105 mEq/L (ref 96–112)
Creatinine, Ser: 1.28 mg/dL — ABNORMAL HIGH (ref 0.40–1.20)
GFR: 39.98 mL/min — ABNORMAL LOW (ref >60.00)
Glucose, Bld: 115 mg/dL — ABNORMAL HIGH (ref 70–99)
Potassium: 4 mEq/L (ref 3.5–5.1)
Sodium: 142 mEq/L (ref 135–145)
Total Bilirubin: 1.3 mg/dL — ABNORMAL HIGH (ref 0.2–1.2)
Total Protein: 6.6 g/dL (ref 6.0–8.3)

## 2019-09-29 LAB — HEMOGLOBIN A1C: Hgb A1c MFr Bld: 7.6 % — ABNORMAL HIGH (ref 4.6–6.5)

## 2019-09-29 LAB — TSH: TSH: 1.67 u[IU]/mL (ref 0.35–4.50)

## 2019-09-29 MED ORDER — SERTRALINE HCL 25 MG PO TABS: 25.0000 mg | ORAL_TABLET | Freq: Every day | ORAL | 1 refills | Status: DC

## 2019-09-29 NOTE — Patient Instructions (Signed)
Starting you on low dose zoloft for depression/sadness. Takes about 3-4 weeks to work. Let me know if any issues. Will see you back in one month. Also think about counseling.    Major Depressive Disorder, Adult Major depressive disorder (MDD) is a mental health condition. MDD often makes you feel sad, hopeless, or helpless. MDD can also cause symptoms in your body. MDD can affect your:  Work.  School.  Relationships.  Other normal activities. MDD can range from mild to very bad. It may occur once (single episode MDD). It can also occur many times (recurrent MDD). The main symptoms of MDD often include:  Feeling sad, depressed, or irritable most of the time.  Loss of interest. MDD symptoms also include:  Sleeping too much or too little.  Eating too much or too little.  A change in your weight.  Feeling tired (fatigue) or having low energy.  Feeling worthless.  Feeling guilty.  Trouble making decisions.  Trouble thinking clearly.  Thoughts of suicide or harming others.  Feeling weak.  Feeling agitated.  Keeping yourself from being around other people (isolation). Follow these instructions at home: Activity  Do these things as told by your doctor: ? Go back to your normal activities. ? Exercise regularly. ? Spend time outdoors. Alcohol  Talk with your doctor about how alcohol can affect your antidepressant medicines.  Do not drink alcohol. Or, limit how much alcohol you drink. ? This means no more than 1 drink a day for nonpregnant women and 2 drinks a day for men. One drink equals one of these:  12 oz of beer.  5 oz of wine.  1 oz of hard liquor. General instructions  Take over-the-counter and prescription medicines only as told by your doctor.  Eat a healthy diet.  Get plenty of sleep.  Find activities that you enjoy. Make time to do them.  Think about joining a support group. Your doctor may be able to suggest a group for you.  Keep all  follow-up visits as told by your doctor. This is important. Where to find more information:  [employer] on Mental Illness: ? www.nami.[place]: ? https://carter.com/  National Suicide Prevention Lifeline: ? (234)518-5326. This is free, 24-hour help. Contact a doctor if:  Your symptoms get worse.  You have new symptoms. Get help right away if:  You self-harm.  You see, hear, taste, smell, or feel things that are not present (hallucinate). If you ever feel like you may hurt yourself or others, or have thoughts about taking your own life, get help right away. You can go to your nearest emergency department or call:  Your local emergency services (911 in the [place]  A suicide crisis helpline, such as the National Suicide Prevention Lifeline: ? 517 766 9072. This is open 24 hours a day. This information is not intended to replace advice given to you by your health care provider. Make sure you discuss any questions you have with your health care provider. Document Released: 11/25/2015 Document Revised: 11/26/2017 Document Reviewed: 08/30/2016 Elsevier Patient Education  2020 [employer].

## 2019-10-03 DIAGNOSIS — M0600 Rheumatoid arthritis without rheumatoid factor, unspecified site: Secondary | ICD-10-CM | POA: Diagnosis not present

## 2019-10-12 ENCOUNTER — Ambulatory Visit (INDEPENDENT_AMBULATORY_CARE_PROVIDER_SITE_OTHER): Payer: Medicare HMO | Admitting: Family Medicine

## 2019-10-12 ENCOUNTER — Encounter: Payer: Self-pay | Admitting: Family Medicine

## 2019-10-12 ENCOUNTER — Ambulatory Visit: Payer: Self-pay | Admitting: Family Medicine

## 2019-10-12 VITALS — BP 183/96 | HR 62 | Temp 96.9°F | Ht 62.0 in | Wt 180.6 lb

## 2019-10-12 DIAGNOSIS — I10 Essential (primary) hypertension: Secondary | ICD-10-CM | POA: Diagnosis not present

## 2019-10-12 DIAGNOSIS — R0601 Orthopnea: Secondary | ICD-10-CM | POA: Diagnosis not present

## 2019-10-12 DIAGNOSIS — R600 Localized edema: Secondary | ICD-10-CM

## 2019-10-12 NOTE — Progress Notes (Signed)
Patient: Cheryl Wood MRN: [id number] DOB: [birth year] PCP: Orma Flaming, MD     I connected with [name] on 10/12/19 at 10:12am by a video enabled telemedicine application and verified that I am speaking with the correct person using two identifiers.  Location patient: Home Location provider: [facility], Office Persons participating in this virtual visit: Daureen Wassell and Dr. Rogers Blocker   I discussed the limitations of evaluation and management by telemedicine and the availability of in person appointments. The patient expressed understanding and agreed to proceed.   Subjective:  Chief Complaint  Patient presents with  . Shortness of Breath  . Facial Swelling  . Leg Swelling  . swelling in feet    HPI: The patient is a 83 y.o. female who presents today for facial, hand, leg and feet swelling.  She is having a lot of heaviness in her chest and SOB. She started to swell when I decreased her lasix down to 80mg . She was on 160mg  and I wanted to decrease her down to see if she could tolerate and b/c her BUN was up. She states she can still get her shoes on, but you can press and fingerprint stays indented. Her weight is up 6 pounds from 13 days ago when I saw her in office. She is not worried and states this happens anytime a doctor tries to decrease her lasix down. She has no shortness of breath at reast and states she does get more short of breath with exertion and has to use a pillow at night. Both legs are swollen and  she thinks her feet and hands and face are as well. Seh has no cough, chest pain or palpitations. Blood pressure is elevated for her as it was well controlled in office 2 weeks ago with manual check. She does have her compression hose on.   Review of Systems  Constitutional: Positive for fatigue.  HENT: Positive for facial swelling.   Eyes: Negative for visual disturbance.  Respiratory: Positive for shortness of breath. Negative for cough and chest tightness.    Cardiovascular: Positive for leg swelling. Negative for chest pain and palpitations.  Gastrointestinal: Negative for abdominal pain, diarrhea, nausea and vomiting.  Neurological: Negative for dizziness and headaches.    Allergies Patient is allergic to tetanus toxoids and hctz [hydrochlorothiazide].  Past Medical History Patient  has a past medical history of Allergy, Arthritis, Bleeding of blood vessel, Blood in stool, Chronic kidney disease, Depression, Glaucoma, Heart murmur, History of chicken pox, History of recurrent UTIs, History of stomach ulcers, Hyperlipidemia, Hypertension, Memory loss, Rheumatic fever, Seizures ([place]), Stroke ([place]), TIA (transient ischemic attack) (10/22/2013), and Urine incontinence.  Surgical History Patient  has a past surgical history that includes Abdominal hysterectomy; gum transplant; Cataract extraction (Right, May 13, 2014); RIGHT/LEFT HEART CATH AND CORONARY ANGIOGRAPHY (N/A, 05/12/2018); THORACIC AORTOGRAM (N/A, 05/12/2018); Eye surgery (12/13/2018); Appendectomy (1983); and Tonsillectomy.  Family History Pateint's family history includes Alcohol abuse in her maternal grandfather, paternal grandfather, and sister; Arthritis in her brother, father, paternal grandmother, and sister; COPD in her sister; Cancer in her mother and sister; Diabetes in her brother and daughter; Drug abuse in her sister; Early death in her brother, brother, father, maternal grandfather, mother, and sister; Hearing loss in her father; Heart attack in her brother; Heart disease in her brother, daughter, father, paternal grandfather, and sister; Hyperlipidemia in her brother, daughter, father, maternal grandmother, paternal grandfather, and sister; Hypertension in her brother, father, maternal grandmother, paternal grandfather, and sister; Stroke  in her maternal grandmother and paternal grandfather.  Social History Patient  reports that she quit smoking about 47 years ago. Her smoking use  included cigarettes. She quit after 40.00 years of use. She has never used smokeless tobacco. She reports that she does not drink alcohol or use drugs.    Objective: Vitals:   10/12/19 0955  BP: (!) 183/96  Pulse: 62  Temp: (!) 96.9 F (36.1 C)  TempSrc: Oral  Weight: 180 lb 9.6 oz (81.9 kg)  Height: 5\' 2"  (1.575 m)    Body mass index is 33.03 kg/m.  Physical Exam Constitutional:      General: She is not in acute distress.    Appearance: She is well-developed. She is not ill-appearing or diaphoretic.  HENT:     Head: Normocephalic and atraumatic.  Cardiovascular:     Comments: Swelling in lower legs. She has compression hose on, so difficult to rate degree of edema. States from foot, ankle up to above calf for swelling bilaterally  Pulmonary:     Effort: Pulmonary effort is normal.     Comments: No retraction, increased work of breathing. Resting comfortably and talking normally in complete sentences.  Musculoskeletal:     Right lower leg: Edema present.     Left lower leg: Edema present.  Neurological:     General: No focal deficit present.     Mental Status: She is alert and oriented to person, place, and time.  Psychiatric:        Mood and Affect: Mood normal.        Behavior: Behavior normal.        Assessment/plan: 1. Bilateral leg edema She states this is the usual course when anyone tries to decrease her lasix down. Nothing out of ordinary. WE will increase her lasix back up to 160mg . She is to watch her weight. Will come in tomorrow for labs as her creatinine was just barely bumped on last check and add on a bnp. precautions given. She is to let me know if swelling does not resolve with increased lasix. Also checking echo. Last echo in 02/2018. No hx of heart failure, but does have aortic regurg stage III.  - Brain natriuretic peptide; Future - CBC with Differential/Platelet; Future - Comprehensive metabolic panel; Future - TSH; Future - ECHOCARDIOGRAM COMPLETE;  Future  2. Orthopnea Likely secondary to increased fluid from decreased Lasix. Checking labs/echo, increasing medication. Will also see in her in office for quick bp check and will listen to her /look at her legs.  - Brain natriuretic peptide; Future - CBC with Differential/Platelet; Future - Comprehensive metabolic panel; Future - TSH; Future - ECHOCARDIOGRAM COMPLETE; Future  3. Essential hypertension Quite elevated for her. She is asymptomatic. Starting her Lasix back and will check her bp tomorrow in office when she comes in for labs. precautions given.       Return in about 1 day (around 10/13/2019) for labs/bp check.   Orma Flaming, MD [place]  10/12/2019

## 2019-10-12 NOTE — Telephone Encounter (Signed)
Pt reports increased edema both legs, feet, ankles "Pretty much everywhere." States noted increase after lasix dose was decreased from 160mg  to 80mg  (10/02/2019). Reports SOB with exertion this AM and wt increase of 5lbs since 09/29/2019. Reports mild SOB at rest, "Needed extra pillow last night to prop up a bit."  Denies CP, tightness. Call transferred to practice, [facility], for consideration of appt today. Care advise given, any increased SOB, CP go to ED. Pt aware if no availability today advise ED. Pt requests virtual.  CB# [id number]  Reason for Disposition . SEVERE leg swelling (e.g., swelling extends above knee, entire leg is swollen, weeping fluid)  Answer Assessment - Initial Assessment Questions 1. ONSET: "When did the swelling start?" (e.g., minutes, hours, days)     10/5 after lasix decreased 2. LOCATION: "What part of the leg is swollen?"  "Are both legs swollen or just one leg?"    Both legs, feet and ankles 3. SEVERITY: "How bad is the swelling?" (e.g., localized; mild, moderate, severe)  - Localized - small area of swelling localized to one leg  - MILD pedal edema - swelling limited to foot and ankle, pitting edema < 1/4 inch (6 mm) deep, rest and elevation eliminate most or all swelling  - MODERATE edema - swelling of lower leg to knee, pitting edema > 1/4 inch (6 mm) deep, rest and elevation only partially reduce swelling  - SEVERE edema - swelling extends above knee, facial or hand swelling present      severe 4. REDNESS: "Does the swelling look red or infected?"    no 5. PAIN: "Is the swelling painful to touch?" If so, ask: "How painful is it?"   (Scale 1-10; mild, moderate or severe)     no 6. FEVER: "Do you have a fever?" If so, ask: "What is it, how was it measured, and when did it start?"      no 7. CAUSE: "What do you think is causing the leg swelling?"     Decreased lasix dose 8. MEDICAL HISTORY: "Do you have a history of heart failure, kidney disease, liver failure,  or cancer?"    yes 9. RECURRENT SYMPTOM: "Have you had leg swelling before?" If so, ask: "When was the last time?" "What happened that time?"    Lasix decreased. 10. OTHER SYMPTOMS: "Do you have any other symptoms?" (e.g., chest pain, difficulty breathing)      SOB  Protocols used: LEG SWELLING AND EDEMA-A-AH

## 2019-10-12 NOTE — Telephone Encounter (Signed)
See note

## 2019-10-13 ENCOUNTER — Other Ambulatory Visit (INDEPENDENT_AMBULATORY_CARE_PROVIDER_SITE_OTHER): Payer: Medicare HMO

## 2019-10-13 ENCOUNTER — Other Ambulatory Visit: Payer: Self-pay

## 2019-10-13 DIAGNOSIS — R0601 Orthopnea: Secondary | ICD-10-CM | POA: Diagnosis not present

## 2019-10-13 DIAGNOSIS — R600 Localized edema: Secondary | ICD-10-CM

## 2019-10-13 LAB — COMPREHENSIVE METABOLIC PANEL
ALT: 16 U/L (ref 0–35)
AST: 16 U/L (ref 0–37)
Albumin: 4.3 g/dL (ref 3.5–5.2)
Alkaline Phosphatase: 64 U/L (ref 39–117)
BUN: 23 mg/dL (ref 6–23)
CO2: 28 mEq/L (ref 19–32)
Calcium: 9.5 mg/dL (ref 8.4–10.5)
Chloride: 104 mEq/L (ref 96–112)
Creatinine, Ser: 0.98 mg/dL (ref 0.40–1.20)
GFR: 54.41 mL/min — ABNORMAL LOW (ref >60.00)
Glucose, Bld: 117 mg/dL — ABNORMAL HIGH (ref 70–99)
Potassium: 4 mEq/L (ref 3.5–5.1)
Sodium: 139 mEq/L (ref 135–145)
Total Bilirubin: 1.2 mg/dL (ref 0.2–1.2)
Total Protein: 6.4 g/dL (ref 6.0–8.3)

## 2019-10-13 LAB — CBC WITH DIFFERENTIAL/PLATELET
Basophils Absolute: 0 10*3/uL (ref 0.0–0.1)
Basophils Relative: 0.4 % (ref 0.0–3.0)
Eosinophils Absolute: 0.2 10*3/uL (ref 0.0–0.7)
Eosinophils Relative: 2.9 % (ref 0.0–5.0)
HCT: 39.4 % (ref 36.0–46.0)
Hemoglobin: 13.2 g/dL (ref 12.0–15.0)
Lymphocytes Relative: 36.5 % (ref 12.0–46.0)
Lymphs Abs: 2.1 10*3/uL (ref 0.7–4.0)
MCHC: 33.6 g/dL (ref 30.0–36.0)
MCV: 93.2 fl (ref 78.0–100.0)
Monocytes Absolute: 0.8 10*3/uL (ref 0.1–1.0)
Monocytes Relative: 13 % — ABNORMAL HIGH (ref 3.0–12.0)
Neutro Abs: 2.7 10*3/uL (ref 1.4–7.7)
Neutrophils Relative %: 47.2 % (ref 43.0–77.0)
Platelets: 197 10*3/uL (ref 150.0–400.0)
RBC: 4.22 Mil/uL (ref 3.87–5.11)
RDW: 14.3 % (ref 11.5–15.5)
WBC: 5.8 10*3/uL (ref 4.0–10.5)

## 2019-10-13 LAB — TSH: TSH: 1.81 u[IU]/mL (ref 0.35–4.50)

## 2019-10-13 LAB — BRAIN NATRIURETIC PEPTIDE: Pro B Natriuretic peptide (BNP): 73 pg/mL (ref 0.0–100.0)

## 2019-10-16 ENCOUNTER — Ambulatory Visit (HOSPITAL_COMMUNITY): Payer: Medicare HMO | Attending: Cardiovascular Disease

## 2019-10-16 ENCOUNTER — Other Ambulatory Visit: Payer: Self-pay

## 2019-10-16 DIAGNOSIS — R0601 Orthopnea: Secondary | ICD-10-CM

## 2019-10-16 DIAGNOSIS — R600 Localized edema: Secondary | ICD-10-CM

## 2019-10-27 ENCOUNTER — Other Ambulatory Visit: Payer: Self-pay | Admitting: Family Medicine

## 2019-10-27 MED ORDER — LOSARTAN POTASSIUM 100 MG PO TABS: 100.0000 mg | ORAL_TABLET | Freq: Every day | ORAL | 3 refills | Status: DC

## 2019-11-01 ENCOUNTER — Ambulatory Visit (INDEPENDENT_AMBULATORY_CARE_PROVIDER_SITE_OTHER): Payer: Medicare HMO | Admitting: Family Medicine

## 2019-11-01 ENCOUNTER — Encounter: Payer: Self-pay | Admitting: Family Medicine

## 2019-11-01 ENCOUNTER — Other Ambulatory Visit: Payer: Self-pay

## 2019-11-01 VITALS — BP 142/70 | HR 65 | Temp 97.8°F | Ht 62.0 in | Wt 176.8 lb

## 2019-11-01 DIAGNOSIS — F320 Major depressive disorder, single episode, mild: Secondary | ICD-10-CM

## 2019-11-01 DIAGNOSIS — Z23 Encounter for immunization: Secondary | ICD-10-CM | POA: Diagnosis not present

## 2019-11-01 DIAGNOSIS — E1140 Type 2 diabetes mellitus with diabetic neuropathy, unspecified: Secondary | ICD-10-CM | POA: Diagnosis not present

## 2019-11-01 DIAGNOSIS — R69 Illness, unspecified: Secondary | ICD-10-CM | POA: Diagnosis not present

## 2019-11-01 MED ORDER — SERTRALINE HCL 25 MG PO TABS: 25.0000 mg | ORAL_TABLET | Freq: Every day | ORAL | 3 refills | Status: DC

## 2019-11-01 NOTE — Addendum Note (Signed)
Addended by: Kevan Ny on: 11/01/2019 03:06 PM   Modules accepted: Orders

## 2019-11-01 NOTE — Patient Instructions (Signed)

## 2019-11-01 NOTE — Progress Notes (Signed)
Patient: Cheryl Wood MRN: [id number] DOB: [birth year] PCP: Orma Flaming, MD     Subjective:  Chief Complaint  Patient presents with  . Depression  . Peripheral Neuropathy    HPI: The patient is a 83 y.o. female who presents today for follow up on depression.  PHQ-9 score has significantly improved from a 13 one month ago to a 0 today! We started her on zoloft last month. She could tell a difference in the first week and her girls can tell a huge difference. She takes daily and is having no side effects. She is very happy with this medication. She no longer feels like the world is closing in on her, no longer cries at the drop of the hat and feels overall much better. She also remembered that she was on prozac for about one year after her grandmother passed away. No si/hi/ah/vh  C/o numbness (no pain) in R last 3 toes. This started about 3-4 months ago. It first started in the 4&5th toes and now has moved over into the 3rd and 2nd toes. She can't say that they are truly numb. It has a slight tingle to it. She first noticed it when she went to bed at night. The sheet on her feet didn't feel right on her right foot. They don't feel normal all of the time. She has not fallen. No ulcers on the bottom of her foot. It is not painful. Hx of diabetes .  Review of Systems  Constitutional: Negative for chills, fatigue and fever.  Eyes: Negative for visual disturbance.  Respiratory: Negative for cough, chest tightness, shortness of breath and wheezing.   Cardiovascular: Positive for leg swelling. Negative for chest pain and palpitations.       C/o occasional swelling of legs in the morning, or if she has not taken her lasix  Gastrointestinal: Negative for abdominal pain, diarrhea, nausea and vomiting.  Neurological: Positive for numbness (tingling in right toes ). Negative for dizziness, tremors, weakness and headaches.  Psychiatric/Behavioral: Negative for sleep disturbance and suicidal ideas.     Allergies Patient is allergic to tetanus toxoids and hctz [hydrochlorothiazide].  Past Medical History Patient  has a past medical history of Allergy, Arthritis, Bleeding of blood vessel, Blood in stool, Chronic kidney disease, Depression, Glaucoma, Heart murmur, History of chicken pox, History of recurrent UTIs, History of stomach ulcers, Hyperlipidemia, Hypertension, Memory loss, Rheumatic fever, Seizures ([place]), Stroke ([place]), TIA (transient ischemic attack) (10/22/2013), and Urine incontinence.  Surgical History Patient  has a past surgical history that includes Abdominal hysterectomy; gum transplant; Cataract extraction (Right, May 13, 2014); RIGHT/LEFT HEART CATH AND CORONARY ANGIOGRAPHY (N/A, 05/12/2018); THORACIC AORTOGRAM (N/A, 05/12/2018); Eye surgery (12/13/2018); Appendectomy (1983); and Tonsillectomy.  Family History Pateint's family history includes Alcohol abuse in her maternal grandfather, paternal grandfather, and sister; Arthritis in her brother, father, paternal grandmother, and sister; COPD in her sister; Cancer in her mother and sister; Diabetes in her brother and daughter; Drug abuse in her sister; Early death in her brother, brother, father, maternal grandfather, mother, and sister; Hearing loss in her father; Heart attack in her brother; Heart disease in her brother, daughter, father, paternal grandfather, and sister; Hyperlipidemia in her brother, daughter, father, maternal grandmother, paternal grandfather, and sister; Hypertension in her brother, father, maternal grandmother, paternal grandfather, and sister; Stroke in her maternal grandmother and paternal grandfather.  Social History Patient  reports that she quit smoking about 48 years ago. Her smoking use included cigarettes. She quit after 40.00 years  of use. She has never used smokeless tobacco. She reports that she does not drink alcohol or use drugs.    Objective: Vitals:   11/01/19 0938 11/01/19 1017  BP: (!)  154/80 (!) 142/70  Pulse: 65   Temp: 97.8 F (36.6 C)   TempSrc: Skin   SpO2: 97%   Weight: 176 lb 12.8 oz (80.2 kg)   Height: 5\' 2"  (1.575 m)     Body mass index is 32.34 kg/m.  Physical Exam Vitals signs reviewed.  Constitutional:      General: She is not in acute distress.    Appearance: Normal appearance. She is normal weight. She is not ill-appearing.  Cardiovascular:     Rate and Rhythm: Normal rate and regular rhythm.     Pulses: Normal pulses.     Heart sounds: Murmur present.  Pulmonary:     Effort: Pulmonary effort is normal.     Breath sounds: Normal breath sounds.  Abdominal:     General: Abdomen is flat. Bowel sounds are normal.     Palpations: Abdomen is soft.  Skin:    General: Skin is warm and dry.  Neurological:     General: No focal deficit present.     Mental Status: She is alert and oriented to person, place, and time.     Sensory: No sensory deficit.     Motor: No weakness.     Gait: Gait normal.     Comments: See diabetic foot exam  Psychiatric:        Mood and Affect: Mood normal.        Behavior: Behavior normal.    Depression screen [facility] 2/9 11/01/2019 09/29/2019  Decreased Interest 0 3  Down, Depressed, Hopeless 0 3  PHQ - 2 Score 0 6  Altered sleeping 0 3  Tired, decreased energy 0 0  Change in appetite 0 3  Feeling bad or failure about yourself  0 0  Trouble concentrating 0 0  Moving slowly or fidgety/restless 0 1  Suicidal thoughts 0 0  PHQ-9 Score 0 13  Difficult doing work/chores Not difficult at all Not difficult at all       Assessment/plan: 1. Depression, major, single episode, mild (HCC) Extremely significant improvement in her phq9 score and clinically doing very well. Happy with how much she likes zoloft. Continue current low dose. Discussed plan would be to keep her on for a year and then discuss about weaning off. 90 day supply sent in. F/u in 6 months.   2. Neuropathy due to type 2 diabetes mellitus ([place]) Foot exam  normal; however, she have decreased sensation compared to her left foot even though she can feel on the right. Discussed plan would be to get her diabetes under control. Not having pain and not bothering her so I would like to hold off on medication that could put her at risk for drowsiness and falls. Will work on diabetic control and she can let me know if this is getting worse. Also need to check a b12 and will add on to her labs in 2 months for her diabetes.   3. Pneumovax 23 today.    Return in about 2 months (around 01/01/2020) for diabetes/labs/b12.    Orma Flaming, MD [place]   11/01/2019

## 2019-11-27 ENCOUNTER — Other Ambulatory Visit: Payer: Self-pay | Admitting: Family Medicine

## 2019-11-28 DIAGNOSIS — H35033 Hypertensive retinopathy, bilateral: Secondary | ICD-10-CM | POA: Diagnosis not present

## 2019-11-28 DIAGNOSIS — H209 Unspecified iridocyclitis: Secondary | ICD-10-CM | POA: Diagnosis not present

## 2019-11-28 DIAGNOSIS — Z961 Presence of intraocular lens: Secondary | ICD-10-CM | POA: Diagnosis not present

## 2019-11-28 DIAGNOSIS — H4042X2 Glaucoma secondary to eye inflammation, left eye, moderate stage: Secondary | ICD-10-CM | POA: Diagnosis not present

## 2019-11-29 DIAGNOSIS — M0600 Rheumatoid arthritis without rheumatoid factor, unspecified site: Secondary | ICD-10-CM | POA: Diagnosis not present

## 2019-11-29 LAB — COMPREHENSIVE METABOLIC PANEL
Albumin: 4.4 (ref 3.5–5.0)
Globulin: 2.2

## 2019-11-29 LAB — BASIC METABOLIC PANEL
BUN: 21 (ref 4–21)
Chloride: 106 (ref 99–108)
Creatinine: 1.1 (ref <=1.1)
Glucose: 151
Potassium: 4.3 (ref 3.4–5.3)
Sodium: 144 (ref 137–147)

## 2019-11-29 LAB — CBC: RBC: 4.23 (ref 3.87–5.11)

## 2019-11-29 LAB — TSH: TSH: 1.82 (ref <=5.90)

## 2019-11-29 LAB — VITAMIN B12: Vitamin B-12: 607

## 2019-11-29 LAB — CBC AND DIFFERENTIAL
HCT: 41 (ref 36–46)
Hemoglobin: 13.5 (ref 12.0–16.0)
Platelets: 164 (ref 150–399)
WBC: 4.8

## 2019-11-29 LAB — HEPATIC FUNCTION PANEL
ALT: 15 (ref 7–35)
AST: 16 (ref 13–35)
Bilirubin, Total: 0.7

## 2019-11-29 LAB — HEMOGLOBIN A1C: Hemoglobin A1C: 7

## 2019-12-18 DIAGNOSIS — H4042X2 Glaucoma secondary to eye inflammation, left eye, moderate stage: Secondary | ICD-10-CM | POA: Diagnosis not present

## 2020-01-01 ENCOUNTER — Other Ambulatory Visit: Payer: Self-pay

## 2020-01-01 ENCOUNTER — Encounter: Payer: Self-pay | Admitting: Family Medicine

## 2020-01-01 ENCOUNTER — Ambulatory Visit (INDEPENDENT_AMBULATORY_CARE_PROVIDER_SITE_OTHER): Payer: Medicare HMO | Admitting: Family Medicine

## 2020-01-01 VITALS — BP 144/82 | HR 56 | Temp 97.3°F | Ht 62.0 in | Wt 178.8 lb

## 2020-01-01 DIAGNOSIS — W19XXXA Unspecified fall, initial encounter: Secondary | ICD-10-CM

## 2020-01-01 DIAGNOSIS — E119 Type 2 diabetes mellitus without complications: Secondary | ICD-10-CM | POA: Diagnosis not present

## 2020-01-01 DIAGNOSIS — E1140 Type 2 diabetes mellitus with diabetic neuropathy, unspecified: Secondary | ICD-10-CM

## 2020-01-01 LAB — COMPREHENSIVE METABOLIC PANEL
ALT: 18 U/L (ref 0–35)
AST: 22 U/L (ref 0–37)
Albumin: 4.3 g/dL (ref 3.5–5.2)
Alkaline Phosphatase: 63 U/L (ref 39–117)
BUN: 26 mg/dL — ABNORMAL HIGH (ref 6–23)
CO2: 26 mEq/L (ref 19–32)
Calcium: 9.4 mg/dL (ref 8.4–10.5)
Chloride: 107 mEq/L (ref 96–112)
Creatinine, Ser: 1.01 mg/dL (ref 0.40–1.20)
GFR: 52.52 mL/min — ABNORMAL LOW (ref >60.00)
Glucose, Bld: 137 mg/dL — ABNORMAL HIGH (ref 70–99)
Potassium: 4.3 mEq/L (ref 3.5–5.1)
Sodium: 143 mEq/L (ref 135–145)
Total Bilirubin: 0.8 mg/dL (ref 0.2–1.2)
Total Protein: 6.8 g/dL (ref 6.0–8.3)

## 2020-01-01 LAB — HEMOGLOBIN A1C: Hgb A1c MFr Bld: 7.2 % — ABNORMAL HIGH (ref 4.6–6.5)

## 2020-01-01 LAB — VITAMIN B12: Vitamin B-12: 525 pg/mL (ref 211–911)

## 2020-01-01 MED ORDER — FUROSEMIDE 80 MG PO TABS: 160.0000 mg | ORAL_TABLET | Freq: Every day | ORAL | 2 refills | Status: DC

## 2020-01-01 NOTE — Patient Instructions (Signed)
You can try over the counter tiger balm (capsacian cream) for your toe. I don't think it will work, but you can always try! It's a nerve medication.

## 2020-01-01 NOTE — Progress Notes (Signed)
Patient: Cheryl Wood MRN: [id number] DOB: 07-Sep-1936 PCP: Orma Flaming, MD     Subjective:  Chief Complaint  Patient presents with  . Diabetes  . Peripheral Neuropathy    HPI: The patient is a 84 y.o. female who presents today for diabetes follow up as well as neuropathy. We need to check her b12.   Diabetes: Patient is here for follow up of type 2 diabetes.  Currently on the following medications none. She has really tried to change her diet. Last A1C was 7.6. Currently exercising and following diabetic diet.  Denies any hypoglycemic events. Denies any vision changes, nausea, vomiting, abdominal pain, ulcers/paraesthesia in feet, polyuria, polydipsia or polyphagia. Denies any chest pain, shortness of breath.   Neuropathy: I saw her back in November and did a foot exam which was normal. Her pain was not bothering her so we decided to hold off on medication and we are going to check b12 today. She also feels like her toes have improved since I saw her last time. It's just the little toe on her right side instead of the other 2 toes. She still has symptoms all of th time, but it has improved.   Fall in shower: right before christmas she was getting out of her tub and tripped as she was getting out. She thinks her leg got caught. Her right leg wound up between the toilet and the bath tub. She grabbed the shower curtain to help lighten the fall and kind of  slid down the wall. She did not hit her head and thinks her elbow took a big brunt of the fall. She states her right leg around her latera knee is still sore. She has taken tylenol for the pain. She was on her feet for 8.5 hours yesterday working and it was sore. It does feel better with a wrap. She denies any redness or swelling. She has full flexion and extension with no pain. It only seems to hurt with weight bearing for long periods of time.   Review of Systems  Constitutional: Negative for fatigue.  Eyes: Negative for visual  disturbance.  Respiratory: Negative for cough and shortness of breath.   Cardiovascular: Positive for leg swelling (baseline). Negative for chest pain and palpitations.  Gastrointestinal: Negative for abdominal pain and nausea.  Musculoskeletal: Positive for arthralgias. Negative for gait problem and joint swelling.  Skin: Negative for rash.  Neurological: Negative for dizziness, numbness and headaches.  Psychiatric/Behavioral: Positive for sleep disturbance.    Allergies Patient is allergic to tetanus toxoids and hctz [hydrochlorothiazide].  Past Medical History Patient  has a past medical history of Allergy, Arthritis, Bleeding of blood vessel, Blood in stool, Chronic kidney disease, Depression, Glaucoma, Heart murmur, History of chicken pox, History of recurrent UTIs, History of stomach ulcers, Hyperlipidemia, Hypertension, Memory loss, Rheumatic fever, Seizures ([place]), Stroke ([place]), TIA (transient ischemic attack) (10/22/2013), and Urine incontinence.  Surgical History Patient  has a past surgical history that includes Abdominal hysterectomy; gum transplant; Cataract extraction (Right, May 13, 2014); RIGHT/LEFT HEART CATH AND CORONARY ANGIOGRAPHY (N/A, 05/12/2018); THORACIC AORTOGRAM (N/A, 05/12/2018); Eye surgery (12/13/2018); Appendectomy (1983); and Tonsillectomy.  Family History Pateint's family history includes Alcohol abuse in her maternal grandfather, paternal grandfather, and sister; Arthritis in her brother, father, paternal grandmother, and sister; COPD in her sister; Cancer in her mother and sister; Diabetes in her brother and daughter; Drug abuse in her sister; Early death in her brother, brother, father, maternal grandfather, mother, and sister; Hearing loss in  her father; Heart attack in her brother; Heart disease in her brother, daughter, father, paternal grandfather, and sister; Hyperlipidemia in her brother, daughter, father, maternal grandmother, paternal grandfather, and  sister; Hypertension in her brother, father, maternal grandmother, paternal grandfather, and sister; Stroke in her maternal grandmother and paternal grandfather.  Social History Patient  reports that she quit smoking about 48 years ago. Her smoking use included cigarettes. She quit after 40.00 years of use. She has never used smokeless tobacco. She reports that she does not drink alcohol or use drugs.    Objective: Vitals:   01/01/20 0801  BP: (!) 144/82  Pulse: (!) 56  Temp: (!) 97.3 F (36.3 C)  TempSrc: Skin  SpO2: 95%  Weight: 178 lb 12.8 oz (81.1 kg)  Height: 5\' 2"  (1.575 m)    Body mass index is 32.7 kg/m.  Physical Exam Vitals reviewed.  Constitutional:      Appearance: Normal appearance. She is obese.  HENT:     Head: Normocephalic and atraumatic.     Nose: Nose normal.     Mouth/Throat:     Mouth: Mucous membranes are moist.  Cardiovascular:     Rate and Rhythm: Normal rate and regular rhythm.     Heart sounds: Murmur present.  Pulmonary:     Effort: Pulmonary effort is normal.     Breath sounds: Normal breath sounds.  Abdominal:     General: Bowel sounds are normal.     Palpations: Abdomen is soft.  Musculoskeletal:        General: Tenderness (left lateral knee. ) present. No swelling.     Cervical back: Normal range of motion and neck supple.     Right lower leg: Edema present.     Left lower leg: Edema present.     Comments: Left knee: negative draw signs. TTP over lateral aspect of knee and distal lateral aspect of femur. Full extension past 180 degrees and full flexion pat 90 degrees. Negative pain with valgus and varus strain. Negative mcmurray's. Gait normal.   Skin:    General: Skin is warm and dry.  Neurological:     General: No focal deficit present.     Mental Status: She is alert and oriented to person, place, and time.  Psychiatric:        Mood and Affect: Mood normal.        Behavior: Behavior normal.        Fall Risk  01/01/2020 01/25/2019  01/25/2019  Falls in the past year? 1 0 0  Number falls in past yr: 0 0 0  Injury with Fall? 1 0 -     Assessment/plan: 1. Neuropathy due to type 2 diabetes mellitus (HCC) Improved. Will check b12 today, but continue with conservative therapy and control of diabetes.  - Vitamin B12  2. Diabetes mellitus without complication ([place]) Will check today. F/u in 3-6 months depending on results. At her age, will continue with diet controlled unless a1c gets out of range for her age. She has changed her diet.  - Comprehensive metabolic panel - Hemoglobin A1c  3. Fall, initial encounter No red flags on exam. Continue with supportive therapy and trial of voltaren gel qid. Tylenol only for pain with CKD. If not improving over next few weeks or worsening pain will xray and then decide if mri is warranted.    This visit occurred during the SARS-CoV-2 public health emergency.  Safety protocols were in place, including screening questions prior to the visit,  additional usage of staff PPE, and extensive cleaning of exam room while observing appropriate contact time as indicated for disinfecting solutions.    Return in about 6 months (around 06/30/2020) for fasting labs/routine appt. Orma Flaming, MD [place]   01/01/2020

## 2020-01-04 ENCOUNTER — Other Ambulatory Visit: Payer: Self-pay | Admitting: Family Medicine

## 2020-01-08 ENCOUNTER — Telehealth: Payer: Self-pay | Admitting: Family Medicine

## 2020-01-08 NOTE — Telephone Encounter (Signed)
Attempted to reach patient regarding lab results.  There was n/a.  Will try back again later.

## 2020-01-08 NOTE — Telephone Encounter (Signed)
Pt returned call from office about lab results. Please advise.

## 2020-01-09 NOTE — Telephone Encounter (Signed)
Spoke to patient and reviewed all lab results.  She verbalized understanding.

## 2020-01-15 ENCOUNTER — Telehealth: Payer: Self-pay

## 2020-01-15 ENCOUNTER — Other Ambulatory Visit: Payer: Self-pay | Admitting: Family Medicine

## 2020-01-15 DIAGNOSIS — M25562 Pain in left knee: Secondary | ICD-10-CM

## 2020-01-15 DIAGNOSIS — W19XXXA Unspecified fall, initial encounter: Secondary | ICD-10-CM

## 2020-01-15 NOTE — Telephone Encounter (Signed)
Please see message below and advise.

## 2020-01-15 NOTE — Telephone Encounter (Signed)
Yes! I put in order for left knee xray, just have her make lab appointment.  Thanks,  Dr. Rogers Blocker

## 2020-01-15 NOTE — Telephone Encounter (Signed)
Spoke to patient and scheduled her for appt on 1/19 for xray of her left knee.  She verbalized understanding.

## 2020-01-15 NOTE — Telephone Encounter (Signed)
Patient stated that she fell last week and her knee is still giving her problems. Patent states that Dr. Rogers Blocker told her she can come in to get x-ray on order for knee x-ray

## 2020-01-16 ENCOUNTER — Ambulatory Visit (INDEPENDENT_AMBULATORY_CARE_PROVIDER_SITE_OTHER): Payer: Medicare HMO

## 2020-01-16 ENCOUNTER — Other Ambulatory Visit: Payer: Self-pay

## 2020-01-16 ENCOUNTER — Other Ambulatory Visit: Payer: Medicare HMO

## 2020-01-16 DIAGNOSIS — M25562 Pain in left knee: Secondary | ICD-10-CM

## 2020-01-16 DIAGNOSIS — S8992XA Unspecified injury of left lower leg, initial encounter: Secondary | ICD-10-CM | POA: Diagnosis not present

## 2020-01-16 DIAGNOSIS — W19XXXA Unspecified fall, initial encounter: Secondary | ICD-10-CM

## 2020-01-23 ENCOUNTER — Encounter: Payer: Self-pay | Admitting: Family Medicine

## 2020-01-24 DIAGNOSIS — M0600 Rheumatoid arthritis without rheumatoid factor, unspecified site: Secondary | ICD-10-CM | POA: Diagnosis not present

## 2020-01-30 ENCOUNTER — Ambulatory Visit (INDEPENDENT_AMBULATORY_CARE_PROVIDER_SITE_OTHER): Payer: Medicare HMO

## 2020-01-30 ENCOUNTER — Other Ambulatory Visit: Payer: Self-pay

## 2020-01-30 DIAGNOSIS — Z0000 Encounter for general adult medical examination without abnormal findings: Secondary | ICD-10-CM

## 2020-01-30 NOTE — Patient Instructions (Signed)
[name] , Thank you for taking time to come for your Medicare Wellness Visit. I appreciate your ongoing commitment to your health goals. Please review the following plan we discussed and let me know if I can assist you in the future.   Screening recommendations/referrals: Colorectal Screening: No longer indicated  Mammogram: Up to date; last 03/15/19 Bone Density: up to date  Vision and Dental Exams: Recommended annual ophthalmology exams for early detection of glaucoma and other disorders of the eye Recommended annual dental exams for proper oral hygiene  Diabetic Exams: Diabetic Eye Exam: recommended yearly; up to date Diabetic Foot Exam: recommended yearly; up to date  Vaccinations: Influenza vaccine: completed 09/29/19 Pneumococcal vaccine: up to date; last 11/01/19 Shingles vaccine: Please call your insurance company to determine your out of pocket expense for the Shingrix vaccine. You may receive this vaccine at your local pharmacy. (see attached information) COVID-19 Vaccine Information can be found at: ShippingScam.co.uk For questions related to vaccine distribution or appointments, please email vaccine@[email or web address] .com or call [phone].   Advanced directives: Please bring a copy of your POA (Power of Attorney) and/or Living Will to your next appointment.  Goals: Recommend to drink at least 6-8 8oz glasses of water per day and consume a balanced diet rich in fresh fruits and vegetables.   Next appointment: Please schedule your Annual Wellness Visit with your Nurse Health Advisor in one year.  Preventive Care 82 Years and Older, Female Preventive care refers to lifestyle choices and visits with your health care provider that can promote health and wellness. What does preventive care include?  A yearly physical exam. This is also called an annual well check.  Dental exams once or twice a year.  Routine eye exams.  Ask your health care provider how often you should have your eyes checked.  Personal lifestyle choices, including:  Daily care of your teeth and gums.  Regular physical activity.  Eating a healthy diet.  Avoiding tobacco and drug use.  Limiting alcohol use.  Practicing safe sex.  Taking low-dose aspirin every day if recommended by your health care provider.  Taking vitamin and mineral supplements as recommended by your health care provider. What happens during an annual well check? The services and screenings done by your health care provider during your annual well check will depend on your age, overall health, lifestyle risk factors, and family history of disease. Counseling  Your health care provider may ask you questions about your:  Alcohol use.  Tobacco use.  Drug use.  Emotional well-being.  Home and relationship well-being.  Sexual activity.  Eating habits.  History of falls.  Memory and ability to understand (cognition).  Work and work Statistician.  Reproductive health. Screening  You may have the following tests or measurements:  Height, weight, and BMI.  Blood pressure.  Lipid and cholesterol levels. These may be checked every 5 years, or more frequently if you are over 74 years old.  Skin check.  Lung cancer screening. You may have this screening every year starting at age 25 if you have a 30-pack-year history of smoking and currently smoke or have quit within the past 15 years.  Fecal occult blood test (FOBT) of the stool. You may have this test every year starting at age 59.  Flexible sigmoidoscopy or colonoscopy. You may have a sigmoidoscopy every 5 years or a colonoscopy every 10 years starting at age 71.  Hepatitis C blood test.  Hepatitis B blood test.  Sexually transmitted disease (STD) testing.  Diabetes screening. This is done by checking your blood sugar (glucose) after you have not eaten for a while (fasting). You may have  this done every 1-3 years.  Bone density scan. This is done to screen for osteoporosis. You may have this done starting at age 28.  Mammogram. This may be done every 1-2 years. Talk to your health care provider about how often you should have regular mammograms. Talk with your health care provider about your test results, treatment options, and if necessary, the need for more tests. Vaccines  Your health care provider may recommend certain vaccines, such as:  Influenza vaccine. This is recommended every year.  Tetanus, diphtheria, and acellular pertussis (Tdap, Td) vaccine. You may need a Td booster every 10 years.  Zoster vaccine. You may need this after age 56.  Pneumococcal 13-valent conjugate (PCV13) vaccine. One dose is recommended after age 74.  Pneumococcal polysaccharide (PPSV23) vaccine. One dose is recommended after age 20. Talk to your health care provider about which screenings and vaccines you need and how often you need them. This information is not intended to replace advice given to you by your health care provider. Make sure you discuss any questions you have with your health care provider. Document Released: 01/10/2016 Document Revised: 09/02/2016 Document Reviewed: 10/15/2015 Elsevier Interactive Patient Education  2017 [place] Prevention in the Home Falls can cause injuries. They can happen to people of all ages. There are many things you can do to make your home safe and to help prevent falls. What can I do on the outside of my home?  Regularly fix the edges of walkways and driveways and fix any cracks.  Remove anything that might make you trip as you walk through a door, such as a raised step or threshold.  Trim any bushes or trees on the path to your home.  Use bright outdoor lighting.  Clear any walking paths of anything that might make someone trip, such as rocks or tools.  Regularly check to see if handrails are loose or broken. Make sure  that both sides of any steps have handrails.  Any raised decks and porches should have guardrails on the edges.  Have any leaves, snow, or ice cleared regularly.  Use sand or salt on walking paths during winter.  Clean up any spills in your garage right away. This includes oil or grease spills. What can I do in the bathroom?  Use night lights.  Install grab bars by the toilet and in the tub and shower. Do not use towel bars as grab bars.  Use non-skid mats or decals in the tub or shower.  If you need to sit down in the shower, use a plastic, non-slip stool.  Keep the floor dry. Clean up any water that spills on the floor as soon as it happens.  Remove soap buildup in the tub or shower regularly.  Attach bath mats securely with double-sided non-slip rug tape.  Do not have throw rugs and other things on the floor that can make you trip. What can I do in the bedroom?  Use night lights.  Make sure that you have a light by your bed that is easy to reach.  Do not use any sheets or blankets that are too big for your bed. They should not hang down onto the floor.  Have a firm chair that has side arms. You can use this for support while you get dressed.  Do not have throw  rugs and other things on the floor that can make you trip. What can I do in the kitchen?  Clean up any spills right away.  Avoid walking on wet floors.  Keep items that you use a lot in easy-to-reach places.  If you need to reach something above you, use a strong step stool that has a grab bar.  Keep electrical cords out of the way.  Do not use floor polish or wax that makes floors slippery. If you must use wax, use non-skid floor wax.  Do not have throw rugs and other things on the floor that can make you trip. What can I do with my stairs?  Do not leave any items on the stairs.  Make sure that there are handrails on both sides of the stairs and use them. Fix handrails that are broken or loose. Make  sure that handrails are as long as the stairways.  Check any carpeting to make sure that it is firmly attached to the stairs. Fix any carpet that is loose or worn.  Avoid having throw rugs at the top or bottom of the stairs. If you do have throw rugs, attach them to the floor with carpet tape.  Make sure that you have a light switch at the top of the stairs and the bottom of the stairs. If you do not have them, ask someone to add them for you. What else can I do to help prevent falls?  Wear shoes that:  Do not have high heels.  Have rubber bottoms.  Are comfortable and fit you well.  Are closed at the toe. Do not wear sandals.  If you use a stepladder:  Make sure that it is fully opened. Do not climb a closed stepladder.  Make sure that both sides of the stepladder are locked into place.  Ask someone to hold it for you, if possible.  Clearly mark and make sure that you can see:  Any grab bars or handrails.  First and last steps.  Where the edge of each step is.  Use tools that help you move around (mobility aids) if they are needed. These include:  Canes.  Walkers.  Scooters.  Crutches.  Turn on the lights when you go into a dark area. Replace any light bulbs as soon as they burn out.  Set up your furniture so you have a clear path. Avoid moving your furniture around.  If any of your floors are uneven, fix them.  If there are any pets around you, be aware of where they are.  Review your medicines with your doctor. Some medicines can make you feel dizzy. This can increase your chance of falling. Ask your doctor what other things that you can do to help prevent falls. This information is not intended to replace advice given to you by your health care provider. Make sure you discuss any questions you have with your health care provider. Document Released: 10/10/2009 Document Revised: 05/21/2016 Document Reviewed: 01/18/2015 Elsevier Interactive Patient Education   2017 [employer].

## 2020-01-30 NOTE — Progress Notes (Signed)
This visit is being conducted via phone call due to the COVID-19 pandemic. This patient has given me verbal consent via phone to conduct this visit, patient states they are participating from their home address. Some vital signs may be absent or patient reported.   Patient identification: identified by name, DOB, and current address.  Location provider: [facility], Office Persons participating in the virtual visit: Denman George LPN, patient, and Dr. Billey Chang     Subjective:   Cheryl Wood is a 84 y.o. female who presents for Medicare Annual (Subsequent) preventive examination.  Review of Systems:   Cardiac Risk Factors include: advanced age (>67men, >79 women);dyslipidemia;hypertension    Objective:     Vitals: LMP  (LMP Unknown)   There is no height or weight on file to calculate BMI.  Advanced Directives 01/30/2020 05/12/2018 10/22/2013  Does Patient Have a Medical Advance Directive? Yes Yes Patient has advance directive, copy not in chart  Type of Advance Directive Living will;Healthcare Power of [place];Living will [place];Living will  Does patient want to make changes to medical advance directive? No - Patient declined No - Patient declined -  Copy of [place] in Chart? No - copy requested No - copy requested Copy requested from other (Comment)  Pre-existing out of facility DNR order (yellow form or pink MOST form) - - No    Tobacco Social History   Tobacco Use  Smoking Status Former Smoker  . Years: 40.00  . Types: Cigarettes  . Quit date: 11/05/1971  . Years since quitting: 48.2  Smokeless Tobacco Never Used     Counseling given: Not Answered   Clinical Intake:  Pre-visit preparation completed: Yes  Pain : No/denies pain  Diabetes: Yes CBG done?: No Did pt. bring in CBG monitor from home?: No  How often do you need to have someone help you when you read instructions, pamphlets, or  other written materials from your doctor or pharmacy?: 1 - Never  Interpreter Needed?: No  Information entered by :: Denman George LPN  Past Medical History:  Diagnosis Date  . Allergy   . Arthritis   . Bleeding of blood vessel    ext. genitalia area  . Blood in stool   . Chronic kidney disease   . Depression   . Glaucoma   . Heart murmur   . History of chicken pox   . History of recurrent UTIs   . History of stomach ulcers   . Hyperlipidemia   . Hypertension   . Memory loss   . Rheumatic fever   . Seizures ([place])   . Stroke ([place])   . TIA (transient ischemic attack) 10/22/2013  . Urine incontinence    Past Surgical History:  Procedure Laterality Date  . ABDOMINAL HYSTERECTOMY    . APPENDECTOMY  1983  . CATARACT EXTRACTION Right May 13, 2014  . EYE SURGERY  12/13/2018   stent inserted in left eye  . gum transplant    . RIGHT/LEFT HEART CATH AND CORONARY ANGIOGRAPHY N/A 05/12/2018   Procedure: RIGHT/LEFT HEART CATH AND CORONARY ANGIOGRAPHY;  Surgeon: Belva Crome, MD;  Location: [facility];  Service: Cardiovascular;  Laterality: N/A;  . THORACIC AORTOGRAM N/A 05/12/2018   Procedure: THORACIC AORTOGRAM;  Surgeon: Belva Crome, MD;  Location: [facility];  Service: Cardiovascular;  Laterality: N/A;  . TONSILLECTOMY     Family History  Problem Relation Age of Onset  .  Cancer Mother   . Early death Mother   . Heart disease Father   . Arthritis Father   . Early death Father   . Hearing loss Father   . Hypertension Father   . Hyperlipidemia Father   . Cancer Sister   . Arthritis Sister   . Arthritis Brother   . Diabetes Brother   . Heart attack Brother   . Heart disease Brother   . Hyperlipidemia Brother   . Hypertension Brother   . Diabetes Daughter   . Heart disease Daughter   . Hyperlipidemia Daughter   . Hyperlipidemia Maternal Grandmother   . Hypertension Maternal Grandmother   . Stroke Maternal Grandmother   . Alcohol abuse Maternal  Grandfather   . Early death Maternal Grandfather        tuberculosis  . Arthritis Paternal Grandmother   . Alcohol abuse Paternal Grandfather   . Heart disease Paternal Grandfather   . Hyperlipidemia Paternal Grandfather   . Hypertension Paternal Grandfather   . Stroke Paternal Grandfather   . Alcohol abuse Sister   . COPD Sister   . Drug abuse Sister   . Early death Sister   . Heart disease Sister   . Hypertension Sister   . Hyperlipidemia Sister   . Early death Brother   . Early death Brother    Social History   Socioeconomic History  . Marital status: Divorced    Spouse name: Not on file  . Number of children: 2  . Years of education: college  . Highest education level: Not on file  Occupational History    Employer: RETIRED  Tobacco Use  . Smoking status: Former Smoker    Years: 40.00    Types: Cigarettes    Quit date: 11/05/1971    Years since quitting: 48.2  . Smokeless tobacco: Never Used  Substance and Sexual Activity  . Alcohol use: No    Alcohol/week: 0.0 standard drinks  . Drug use: No  . Sexual activity: Not Currently  Other Topics Concern  . 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 Difficulty of Paying Living Expenses: Not on file  Food Insecurity:   . Worried About Charity fundraiser in the Last Year: Not on file  . Ran Out of Food in the Last Year: Not on file  Transportation Needs:   . Lack of Transportation (Medical): Not on file  . Lack of Transportation (Non-Medical): Not on file  Physical Activity:   . Days of Exercise per Week: Not on file  . Minutes of Exercise per Session: Not on file  Stress:   . Feeling of Stress : Not on file  Social Connections:   . Frequency  of Communication with Friends and Family: Not on file  . Frequency of Social Gatherings with Friends and Family: Not on file  . Attends Religious Services: Not on file  . Active Member of Clubs or Organizations: Not on file  . Attends Archivist Meetings: Not on file  . Marital Status: Not on file    Outpatient Encounter Medications as of 01/30/2020  Medication Sig  . acetaminophen (TYLENOL) 650 MG CR  tablet Take 650 mg by mouth every 8 (eight) hours as needed for pain.  Marland Kitchen clopidogrel (PLAVIX) 75 MG tablet TAKE ONE TABLET BY MOUTH EVERY OTHER DAY  . dorzolamide-timolol (COSOPT) 22.3-6.8 MG/ML ophthalmic solution Place 1 drop into the left eye daily.  . famciclovir (FAMVIR) 500 MG tablet Take 500 mg by mouth daily.   . famotidine (PEPCID) 20 MG tablet Take 1 tablet (20 mg total) by mouth 2 (two) times daily.  . furosemide (LASIX) 80 MG tablet Take 2 tablets (160 mg total) by mouth daily.  Marland Kitchen inFLIXimab (REMICADE) 100 MG injection Inject into the vein.  Marland Kitchen loratadine (CLARITIN) 10 MG tablet Take 10 mg by mouth daily.  Marland Kitchen losartan (COZAAR) 100 MG tablet Take 1 tablet (100 mg total) by mouth daily.  Marland Kitchen lovastatin (MEVACOR) 20 MG tablet TAKE ONE TABLET BY MOUTH EVERY EVENING  . potassium chloride SA (KLOR-CON M20) 20 MEQ tablet Take 1 tablet (20 mEq total) by mouth daily.  . pramipexole (MIRAPEX) 0.25 MG tablet TAKE ONE TABLET BY MOUTH AT BEDTIME  . sertraline (ZOLOFT) 25 MG tablet Take 1 tablet (25 mg total) by mouth daily.   No facility-administered encounter medications on file as of 01/30/2020.    Activities of Daily Living In your present state of health, do you have any difficulty performing the following activities: 01/30/2020  Hearing? N  Vision? N  Difficulty concentrating or making decisions? N  Walking or climbing stairs? N  Dressing or bathing? N  Doing errands, shopping? N  Preparing Food and eating ? N  Using the Toilet? N  In the past six months, have you accidently  leaked urine? N  Do you have problems with loss of bowel control? N  Managing your Medications? N  Managing your Finances? N  Housekeeping or managing your Housekeeping? N  Some recent data might be hidden    Patient Care Team: Orma Flaming, MD as PCP - General (Family Medicine)    Assessment:   This is a routine wellness examination for Fleda.  Exercise Activities and Dietary recommendations Current Exercise Habits: The patient does not participate in regular exercise at present  Goals   None     Fall Risk Fall Risk  01/30/2020 01/01/2020 01/25/2019 01/25/2019  Falls in the past year? 1 1 0 0  Number falls in past yr: 0 0 0 0  Injury with Fall? 1 1 0 -  Risk for fall due to : History of fall(s);Impaired mobility - - -  Follow up Falls evaluation completed;Education provided;Falls prevention discussed - - -   Is the patient's home free of loose throw rugs in walkways, pet beds, electrical cords, etc?   yes      Grab bars in the bathroom? yes      Handrails on the stairs?   yes      Adequate lighting?   yes  Depression Screen PHQ 2/9 Scores 01/30/2020 11/01/2019 09/29/2019  PHQ - 2 Score 0 0 6  PHQ- 9 Score - 0 13     Cognitive Function MMSE - Mini Mental State Exam 01/28/2015 07/18/2014  Orientation to time 5 5  Orientation to Place 5 5  Registration 3 3  Attention/ Calculation 4 2  Recall 2 3  Language- name 2 objects 2 2  Language- repeat 1 1  Language- follow 3 step command 3 3  Language- read & follow direction 1 1  Write a sentence 1 1  Copy design 1 0  Total score 28  26     6CIT Screen 01/30/2020  What Year? 0 points  What month? 0 points  What time? 0 points  Count back from 20 0 points  Months in reverse 0 points  Repeat phrase 2 points  Total Score 2    Immunization History  Administered Date(s) Administered  . Fluad Quad(high Dose 65+) 09/29/2019  . Influenza Split 08/30/2017  . Influenza-Unspecified 09/28/2018  . Pneumococcal Conjugate-13  03/29/2015  . Pneumococcal Polysaccharide-23 11/01/2019    Qualifies for Shingles Vaccine?Discussed and patient will check with pharmacy for coverage.  Patient education handout provided   Screening Tests Health Maintenance  Topic Date Due  . MAMMOGRAM  03/14/2020  . HEMOGLOBIN A1C  06/30/2020  . OPHTHALMOLOGY EXAM  07/19/2020  . FOOT EXAM  10/31/2020  . INFLUENZA VACCINE  Completed  . DEXA SCAN  Completed  . PNA vac Low Risk Adult  Completed  . TETANUS/TDAP  Discontinued    Cancer Screenings: Lung: Low Dose CT Chest recommended if Age 54-80 years, 30 pack-year currently smoking OR have quit w/in 15years. Patient does not qualify. Breast:  Up to date on Mammogram? Yes   Up to date of Bone Density/Dexa? Yes Colorectal: No longer indicated    Plan:  I have personally reviewed and addressed the Medicare Annual Wellness questionnaire and have noted the following in the patient's chart:  A. Medical and social history B. Use of alcohol, tobacco or illicit drugs  C. Current medications and supplements D. Functional ability and status E.  Nutritional status F.  Physical activity G. Advance directives H. List of other physicians I.  Hospitalizations, surgeries, and ER visits in previous 12 months J.  [place] such as hearing and vision if needed, cognitive and depression L. Referrals, records requested, and appointments- none   In addition, I have reviewed and discussed with patient certain preventive protocols, quality metrics, and best practice recommendations. A written personalized care plan for preventive services as well as general preventive health recommendations were provided to patient.   Signed,  Denman George, LPN  Nurse Health Advisor   Nurse Notes: No additional.  PCP out of office.

## 2020-02-01 DIAGNOSIS — M5416 Radiculopathy, lumbar region: Secondary | ICD-10-CM | POA: Diagnosis not present

## 2020-02-01 DIAGNOSIS — G629 Polyneuropathy, unspecified: Secondary | ICD-10-CM | POA: Diagnosis not present

## 2020-02-01 DIAGNOSIS — H209 Unspecified iridocyclitis: Secondary | ICD-10-CM | POA: Diagnosis not present

## 2020-02-01 DIAGNOSIS — M180 Bilateral primary osteoarthritis of first carpometacarpal joints: Secondary | ICD-10-CM | POA: Diagnosis not present

## 2020-02-01 DIAGNOSIS — M25432 Effusion, left wrist: Secondary | ICD-10-CM | POA: Diagnosis not present

## 2020-02-01 DIAGNOSIS — M1990 Unspecified osteoarthritis, unspecified site: Secondary | ICD-10-CM | POA: Diagnosis not present

## 2020-02-01 DIAGNOSIS — M25562 Pain in left knee: Secondary | ICD-10-CM | POA: Diagnosis not present

## 2020-02-05 ENCOUNTER — Telehealth: Payer: Self-pay | Admitting: Family Medicine

## 2020-02-05 DIAGNOSIS — M25562 Pain in left knee: Secondary | ICD-10-CM

## 2020-02-05 NOTE — Telephone Encounter (Signed)
Patient calls in this afternoon and states that she seen Rogers Blocker in January for knee pain, and they discussed if it hasnt gotten any better that she would put in a referral for physical therapy or for a shot, patient would like to start physical therapy as she is having trouble with doing day to day activities because of this issue.

## 2020-02-05 NOTE — Telephone Encounter (Signed)
Pt was notified that PT order was placed. She was told that Cheryl Wood is out of the office this week, but to expect a call next week when she returns. Pt voiced understanding.

## 2020-02-08 DIAGNOSIS — L249 Irritant contact dermatitis, unspecified cause: Secondary | ICD-10-CM | POA: Diagnosis not present

## 2020-02-19 ENCOUNTER — Other Ambulatory Visit: Payer: Self-pay | Admitting: Family Medicine

## 2020-02-19 DIAGNOSIS — Z1231 Encounter for screening mammogram for malignant neoplasm of breast: Secondary | ICD-10-CM

## 2020-02-22 ENCOUNTER — Other Ambulatory Visit: Payer: Self-pay | Admitting: Family Medicine

## 2020-02-27 ENCOUNTER — Ambulatory Visit: Payer: Medicare HMO | Attending: Family Medicine | Admitting: Physical Therapy

## 2020-02-27 ENCOUNTER — Encounter: Payer: Self-pay | Admitting: Physical Therapy

## 2020-02-27 ENCOUNTER — Other Ambulatory Visit: Payer: Self-pay

## 2020-02-27 DIAGNOSIS — M25562 Pain in left knee: Secondary | ICD-10-CM

## 2020-02-27 DIAGNOSIS — R600 Localized edema: Secondary | ICD-10-CM | POA: Insufficient documentation

## 2020-02-27 DIAGNOSIS — M25662 Stiffness of left knee, not elsewhere classified: Secondary | ICD-10-CM | POA: Diagnosis not present

## 2020-02-27 NOTE — Patient Instructions (Signed)
Access Code: [email or web address]  URL: [email or web address]  Date: 02/27/2020  Prepared by: Madelyn Flavors   Exercises Supine Heel Slide - 10 reps - 2 sets - 1x daily - 7x weekly Supine ITB Stretch with Strap - 3 reps - 1 sets - 20 sec hold - 2x daily - 7x weekly Supine Hamstring Stretch with Strap - 3 reps - 1 sets - 20 sec hold - 2x daily - 7x weekly Patient Education Trigger Point Dry Needling

## 2020-02-27 NOTE — Therapy (Signed)
[facility] [address], [place], [place], [address] Phone: [phone]   Fax:  [phone]  Physical Therapy Evaluation  Patient Details  Name: Cheryl Wood MRN: [id number] Date of Birth: March 18, 1936 Referring Provider (PT): Orma Flaming MD   Encounter Date: 02/27/2020  PT End of Session - 02/27/20 0845    Visit Number  1    Date for PT Re-Evaluation  04/09/20    PT Start Time  0846    PT Stop Time  0927    PT Time Calculation (min)  41 min    Activity Tolerance  Patient tolerated treatment well;Patient limited by pain    Behavior During Therapy  [facility] for tasks assessed/performed       Past Medical History:  Diagnosis Date  . Allergy   . Arthritis   . Bleeding of blood vessel    ext. genitalia area  . Blood in stool   . Chronic kidney disease   . Depression   . Glaucoma   . Heart murmur   . History of chicken pox   . History of recurrent UTIs   . History of stomach ulcers   . Hyperlipidemia   . Hypertension   . Memory loss   . Rheumatic fever   . Seizures ([place])   . Stroke ([place])   . TIA (transient ischemic attack) 10/22/2013  . Urine incontinence     Past Surgical History:  Procedure Laterality Date  . ABDOMINAL HYSTERECTOMY    . APPENDECTOMY  1983  . CATARACT EXTRACTION Right May 13, 2014  . EYE SURGERY  12/13/2018   stent inserted in left eye  . gum transplant    . RIGHT/LEFT HEART CATH AND CORONARY ANGIOGRAPHY N/A 05/12/2018   Procedure: RIGHT/LEFT HEART CATH AND CORONARY ANGIOGRAPHY;  Surgeon: Belva Crome, MD;  Location: [place] [facility];  Service: Cardiovascular;  Laterality: N/A;  . THORACIC AORTOGRAM N/A 05/12/2018   Procedure: THORACIC AORTOGRAM;  Surgeon: Belva Crome, MD;  Location: [place] [facility];  Service: Cardiovascular;  Laterality: N/A;  . TONSILLECTOMY      There were no vitals filed for this visit.   Subjective Assessment - 02/27/20 0848    Subjective  Patient  fell out of her bathtub the week before Christmas 2020. Her left foot got hooked on her transfer bench and her knee hit the lip on the tub. She has pain all the time. Worse with WBing and now radiates down to ankle and into thigh.    Pertinent History  CVA 2007 (Rt side affected), depression, HTN, RA, leaky heart valve, DM, LBP, scoliosis, restless leg syndrome    Limitations  Standing;Walking    Diagnostic tests  xrays- normal degenerative changes    Patient Stated Goals  to be able to stand, walk and put her socks on without pain,    Currently in Pain?  Yes    Pain Score  5     Pain Location  Knee    Pain Orientation  Left    Pain Descriptors / Indicators  Aching    Pain Type  Acute pain    Pain Radiating Towards  lower leg and into hip    Pain Onset  More than a month ago    Pain Frequency  Constant    Aggravating Factors   WBing, sleep    Pain Relieving Factors  rest    Effect of Pain on Daily Activities  wakes at night, pain  with Corbin Ade         [facility] PT Assessment - 02/27/20 0001      Assessment   Medical Diagnosis  acute left knee pain    Referring Provider (PT)  Orma Flaming MD    Onset Date/Surgical Date  12/17/19    Hand Dominance  Right    Next MD Visit  6 months      Precautions   Precautions  None      Restrictions   Weight Bearing Restrictions  No      Balance Screen   Has the patient fallen in the past 6 months  Yes    How many times?  1    Has the patient had a decrease in activity level because of a fear of falling?   No    Is the patient reluctant to leave their home because of a fear of falling?   No      Home Environment   Living Environment  Private residence    Living Arrangements  Alone    Type of Home  Apartment      Prior Function   Level of Independence  Independent    Vocation  Part time employment    Vocation Requirements  reception      Observation/Other Assessments   Focus on Therapeutic Outcomes (FOTO)   63% limited       Observation/Other Assessments-Edema    Edema  --   visually evident     Posture/Postural Control   Posture Comments  stands through RLE      ROM / Strength   AROM / PROM / Strength  AROM;PROM;Strength      AROM   Overall AROM Comments  Right knee 0-130    AROM Assessment Site  Knee    Right/Left Knee  Left    Left Knee Extension  0    Left Knee Flexion  105      PROM   Overall PROM Comments  --    PROM Assessment Site  Knee    Right/Left Knee  Left    Left Knee Flexion  112      Strength   Overall Strength Comments  Left knee ext 5/5, flex 4/5 and pain; left hip flex 4+/5, ABD and ext 5/5; unable to do SLR due to weakness; good quad set      Flexibility   Soft Tissue Assessment /Muscle Length  yes    Hamstrings  left tight    ITB  +ober left    Piriformis  mild tightness left      Palpation   Patella mobility  WNL    Palpation comment  tender left ITB and lateral gastroc, gluteus min, med and lateral HS      Special Tests   Other special tests  neg Apley Compression, ant/post drawer, + ober left      Ambulation/Gait   Gait Comments  antalgic and slow gait speed                Objective measurements completed on examination: See above findings.              PT Education - 02/27/20 1022    Education Details  HEP    Person(s) Educated  Patient    Methods  Explanation;Demonstration;Handout    Comprehension  Verbalized understanding;Returned demonstration          PT Long Term Goals - 02/27/20 1029      PT LONG TERM  GOAL #1   Title  Ind with HEP for strength and flexibility    Status  New    Target Date  04/09/20      PT LONG TERM GOAL #2   Title  Patient able to stand and walk without pain in the left knee.    Time  6    Period  Weeks    Status  New      PT LONG TERM GOAL #3   Title  Patient able to don/doff socks without difficulty    Time  6    Period  Weeks    Status  New      PT LONG TERM GOAL #4   Title  Patient able to  sleep without waking from knee pain    Time  6    Period  Weeks    Status  New      PT LONG TERM GOAL #5   Title  Patient to demo left knee flexion of 0-125 to normalize gait.    Time  6    Period  Weeks    Status  New    Target Date  04/09/20      Additional Long Term Goals   Additional Long Term Goals  Yes      PT LONG TERM GOAL #6   Title  Pt to demo 5/5 left knee strength to normalize ADLs    Time  6    Period  Weeks    Status  New      PT LONG TERM GOAL #7   Title  Improved FOTO to 48% limitation    Baseline  63% limitation    Time  6    Period  Weeks    Status  New             Plan - 02/27/20 0931    Clinical Impression Statement  Patient presents with c/o of left knee pain after a fall out of the tub the week before Christmas 2020. She has increased pain with weightbearing and decreased ROM and strength in the left knee. She also has edema in the left knee compared to right. She is tender and tight in her left gluteals, ITB, lateral gastroc and med/lat hamstrings. She has pain with resisted knee flexion and active knee flexion. These deficits make standing and walking painful and limit her sleep and donning her socks. She will benefit from PT to address these deficits.    Personal Factors and Comorbidities  Comorbidity 3+    Comorbidities  CVA 2007 (Rt side affected), depression, HTN, RA, leaky heart valve, DM, LBP, scoliosis, restless leg syndrome    Examination-Activity Limitations  Dressing;Sleep;Stand    Stability/Clinical Decision Making  Evolving/Moderate complexity    Clinical Decision Making  Low    Rehab Potential  Excellent    PT Frequency  2x / week    PT Duration  6 weeks    PT Treatment/Interventions  ADLs/Self Care Home Management;Cryotherapy;Electrical Stimulation;Iontophoresis 4mg /ml Dexamethasone;Moist Heat;Ultrasound;Therapeutic exercise;Neuromuscular re-education;Patient/family education;Manual techniques;Dry needling;Taping;Vasopneumatic  Device;Joint Manipulations    PT Next Visit Plan  Review HEP, STW or DN to left ITB/HS/gluteals/gastroc; knee ROM/strength    PT Home Exercise Plan  [email or web address]    Consulted and Agree with Plan of Care  Patient       Patient will benefit from skilled therapeutic intervention in order to improve the following deficits and impairments:  Abnormal gait, Increased muscle spasms, Decreased range of motion, Increased edema, Pain, Impaired  flexibility, Decreased strength  Visit Diagnosis: Acute pain of left knee - Plan: PT plan of care cert/re-cert  Stiffness of left knee, not elsewhere classified - Plan: PT plan of care cert/re-cert  Localized edema - Plan: PT plan of care cert/re-cert     Problem List Patient Active Problem List   Diagnosis Date Noted  . Depression, major, single episode, mild ([place]) 09/29/2019  . Polyarthralgia 04/21/2019  . Diabetes mellitus without complication ([place]) 123XX123  . CVA (cerebral vascular accident) ([place]) 01/25/2019  . RLS (restless legs syndrome) 01/25/2019  . Glaucoma, left eye 01/25/2019  . Glaucoma associated with ocular inflammation, left, 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  . Nonrheumatic aortic valve insufficiency   . CAD in native artery   . HTN (hypertension) 10/22/2013  . Hyperlipidemia 10/22/2013    Madelyn Flavors PT 02/27/2020, 10:41 AM  [facility] [address] [place], [place], [address] Phone: [phone]   Fax:  [phone]  Name: TORRENCE HOLCK MRN: [id number] Date of Birth: [birth year]

## 2020-03-04 ENCOUNTER — Ambulatory Visit: Payer: Medicare HMO | Admitting: Physical Therapy

## 2020-03-04 ENCOUNTER — Other Ambulatory Visit: Payer: Self-pay

## 2020-03-04 DIAGNOSIS — R600 Localized edema: Secondary | ICD-10-CM

## 2020-03-04 DIAGNOSIS — M25662 Stiffness of left knee, not elsewhere classified: Secondary | ICD-10-CM | POA: Diagnosis not present

## 2020-03-04 DIAGNOSIS — M25562 Pain in left knee: Secondary | ICD-10-CM

## 2020-03-04 NOTE — Therapy (Signed)
[facility] [address], [place], [place], [address] Phone: [phone]   Fax:  [phone]  Physical Therapy Treatment  Patient Details  Name: Cheryl Wood MRN: [id number] Date of Birth: [birth year] Referring Provider (PT): Orma Flaming MD   Encounter Date: 03/04/2020  PT End of Session - 03/04/20 0937    Visit Number  2    Date for PT Re-Evaluation  04/09/20    PT Start Time  0930    PT Stop Time  1015    PT Time Calculation (min)  45 min    Activity Tolerance  Patient tolerated treatment well;Patient limited by pain    Behavior During Therapy  [facility] for tasks assessed/performed       Past Medical History:  Diagnosis Date  . Allergy   . Arthritis   . Bleeding of blood vessel    ext. genitalia area  . Blood in stool   . Chronic kidney disease   . Depression   . Glaucoma   . Heart murmur   . History of chicken pox   . History of recurrent UTIs   . History of stomach ulcers   . Hyperlipidemia   . Hypertension   . Memory loss   . Rheumatic fever   . Seizures ([place])   . Stroke ([place])   . TIA (transient ischemic attack) 10/22/2013  . Urine incontinence     Past Surgical History:  Procedure Laterality Date  . ABDOMINAL HYSTERECTOMY    . APPENDECTOMY  1983  . CATARACT EXTRACTION Right May 13, 2014  . EYE SURGERY  12/13/2018   stent inserted in left eye  . gum transplant    . RIGHT/LEFT HEART CATH AND CORONARY ANGIOGRAPHY N/A 05/12/2018   Procedure: RIGHT/LEFT HEART CATH AND CORONARY ANGIOGRAPHY;  Surgeon: Belva Crome, MD;  Location: [place] [facility];  Service: Cardiovascular;  Laterality: N/A;  . THORACIC AORTOGRAM N/A 05/12/2018   Procedure: THORACIC AORTOGRAM;  Surgeon: Belva Crome, MD;  Location: [place] [facility];  Service: Cardiovascular;  Laterality: N/A;  . TONSILLECTOMY      There were no vitals filed for this visit.  Subjective Assessment - 03/04/20 0933    Subjective  Pt arriving  to therapy reporting 5/10 pain in Left knee after being up on her feet all day Saturday. Pt reproting resting more yesterday.    Pertinent History  CVA 2007 (Rt side affected), depression, HTN, RA, leaky heart valve, DM, LBP, scoliosis, restless leg syndrome    Limitations  Standing;Walking    Diagnostic tests  xrays- normal degenerative changes    Currently in Pain?  Yes    Pain Score  5     Pain Location  Knee    Pain Orientation  Left    Pain Descriptors / Indicators  Aching    Pain Type  Acute pain    Pain Onset  More than a month ago                       [facility] Adult PT Treatment/Exercise - 03/04/20 0001      Exercises   Exercises  Knee/Hip      Knee/Hip Exercises: Stretches   Active Hamstring Stretch  Left;2 reps;30 seconds    Active Hamstring Stretch Limitations  sitting      Knee/Hip Exercises: Standing   Heel Raises  15 reps    Knee Flexion  AROM;Both;10 reps    Hip Flexion  Stengthening;Both;15 reps    Hip Abduction  Stengthening;10 reps      Knee/Hip Exercises: Seated   Long Arc Quad  Strengthening;Both;15 reps    Marching  AROM;Both;15 reps    Sit to Sand  10 reps;with UE support   from mat table     Knee/Hip Exercises: Supine   Short Arc Quad Sets  Strengthening;Left;10 reps    Heel Slides  AROM;Left;15 reps    Heel Slides Limitations  using slider on mat table    Bridges  Strengthening;Both;10 reps    Bridges Limitations  decreased lift instructed to squeeze glutes and tighten abdominals    Straight Leg Raises  Strengthening;Left;10 reps    Other Supine Knee/Hip Exercises  ball squeezes x 10    Other Supine Knee/Hip Exercises  clam shells: 10 reps      Manual Therapy   Manual Therapy  Soft tissue mobilization    Manual therapy comments  patella mobs    Soft tissue mobilization  IT band rolling             PT Education - 03/04/20 0936    Education Details  Discussed DN again and pt reported she was wanted more time to think about  it.    Person(s) Educated  Patient    Methods  Explanation    Comprehension  Verbalized understanding          PT Long Term Goals - 03/04/20 0946      PT LONG TERM GOAL #1   Title  Ind with HEP for strength and flexibility    Status  On-going      PT LONG TERM GOAL #2   Title  Patient able to stand and walk without pain in the left knee.    Time  6    Period  Weeks    Status  On-going      PT LONG TERM GOAL #3   Title  Patient able to don/doff socks without difficulty    Time  6    Period  Weeks    Status  On-going      PT LONG TERM GOAL #4   Title  Patient able to sleep without waking from knee pain    Time  6    Period  Weeks    Status  On-going      PT LONG TERM GOAL #5   Title  Patient to demo left knee flexion of 0-125 to normalize gait.    Time  6    Period  Weeks    Status  On-going      PT LONG TERM GOAL #6   Title  Pt to demo 5/5 left knee strength to normalize ADLs    Time  6    Period  Weeks    Status  New            Plan - 03/04/20 [id number]    Clinical Impression Statement  Pt tolerating all exercises both sitting, in supine, and standing. Pt allowed rest breaks as needed. We discussed doing her HEP on her bed instead of trying to get up and down off the floor. Pt reported she had trouble getting up. We also discussed DN for a further vist. Pt repoting she needs more time to decide. Pt with mild SOB during activities. O2 sats 97% on RA, HR 69 bpm. Continue with skilled PT to progress toward improved functional mobility and toward goals set.    Personal Factors and Comorbidities  Comorbidity 3+    Comorbidities  CVA 2007 (Rt side affected), depression, HTN, RA, leaky heart valve, DM, LBP, scoliosis, restless leg syndrome    Examination-Activity Limitations  Dressing;Sleep;Stand    Stability/Clinical Decision Making  Evolving/Moderate complexity    Rehab Potential  Excellent    PT Frequency  2x / week    PT Duration  6 weeks    PT  Treatment/Interventions  ADLs/Self Care Home Management;Cryotherapy;Electrical Stimulation;Iontophoresis 4mg /ml Dexamethasone;Moist Heat;Ultrasound;Therapeutic exercise;Neuromuscular re-education;Patient/family education;Manual techniques;Dry needling;Taping;Vasopneumatic Device;Joint Manipulations    PT Next Visit Plan  Review HEP, STW or DN to left ITB/HS/gluteals/gastroc; knee ROM/strength    PT Home Exercise Plan  [email or web address]    Consulted and Agree with Plan of Care  Patient       Patient will benefit from skilled therapeutic intervention in order to improve the following deficits and impairments:  Abnormal gait, Increased muscle spasms, Decreased range of motion, Increased edema, Pain, Impaired flexibility, Decreased strength  Visit Diagnosis: Acute pain of left knee  Stiffness of left knee, not elsewhere classified  Localized edema     Problem List Patient Active Problem List   Diagnosis Date Noted  . Depression, major, single episode, mild ([place]) 09/29/2019  . Polyarthralgia 04/21/2019  . Diabetes mellitus without complication ([place]) 123XX123  . CVA (cerebral vascular accident) ([place]) 01/25/2019  . RLS (restless legs syndrome) 01/25/2019  . Glaucoma, left eye 01/25/2019  . Glaucoma associated with ocular inflammation, left, 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  . Nonrheumatic aortic valve insufficiency   . CAD in native artery   . HTN (hypertension) 10/22/2013  . Hyperlipidemia 10/22/2013    Oretha Caprice, PT 03/04/2020, 10:09 AM  [facility] [address], [place], [place], [address] Phone: [phone]   Fax:  [phone]  Name: Cheryl Wood MRN: [id number] Date of Birth: 09-Jul-1936

## 2020-03-06 ENCOUNTER — Ambulatory Visit: Payer: Medicare HMO | Admitting: Physical Therapy

## 2020-03-06 ENCOUNTER — Other Ambulatory Visit: Payer: Self-pay

## 2020-03-06 ENCOUNTER — Encounter: Payer: Self-pay | Admitting: Physical Therapy

## 2020-03-06 DIAGNOSIS — M25562 Pain in left knee: Secondary | ICD-10-CM | POA: Diagnosis not present

## 2020-03-06 DIAGNOSIS — M25662 Stiffness of left knee, not elsewhere classified: Secondary | ICD-10-CM | POA: Diagnosis not present

## 2020-03-06 DIAGNOSIS — R600 Localized edema: Secondary | ICD-10-CM | POA: Diagnosis not present

## 2020-03-06 NOTE — Therapy (Signed)
[facility] [address] [place], [place], [address] Phone: [phone]   Fax:  [phone]  Physical Therapy Treatment  Patient Details  Name: Cheryl Wood MRN: [id number] Date of Birth: 11-Nov-1936 Referring Provider (PT): Orma Flaming MD   Encounter Date: 03/06/2020  PT End of Session - 03/06/20 0800    Visit Number  3    Date for PT Re-Evaluation  04/09/20    PT Start Time  0801    PT Stop Time  [id number]    PT Time Calculation (min)  43 min       Past Medical History:  Diagnosis Date  . Allergy   . Arthritis   . Bleeding of blood vessel    ext. genitalia area  . Blood in stool   . Chronic kidney disease   . Depression   . Glaucoma   . Heart murmur   . History of chicken pox   . History of recurrent UTIs   . History of stomach ulcers   . Hyperlipidemia   . Hypertension   . Memory loss   . Rheumatic fever   . Seizures ([place])   . Stroke ([place])   . TIA (transient ischemic attack) 10/22/2013  . Urine incontinence     Past Surgical History:  Procedure Laterality Date  . ABDOMINAL HYSTERECTOMY    . APPENDECTOMY  1983  . CATARACT EXTRACTION Right May 13, 2014  . EYE SURGERY  12/13/2018   stent inserted in left eye  . gum transplant    . RIGHT/LEFT HEART CATH AND CORONARY ANGIOGRAPHY N/A 05/12/2018   Procedure: RIGHT/LEFT HEART CATH AND CORONARY ANGIOGRAPHY;  Surgeon: Belva Crome, MD;  Location: [place] [facility];  Service: Cardiovascular;  Laterality: N/A;  . THORACIC AORTOGRAM N/A 05/12/2018   Procedure: THORACIC AORTOGRAM;  Surgeon: Belva Crome, MD;  Location: [place] [facility];  Service: Cardiovascular;  Laterality: N/A;  . TONSILLECTOMY      There were no vitals filed for this visit.  Subjective Assessment - 03/06/20 0802    Subjective  Only having pain when I walk    Pertinent History  CVA 2007 (Rt side affected), depression, HTN, RA, leaky heart valve, DM, LBP, scoliosis, restless leg  syndrome    Patient Stated Goals  to be able to stand, walk and put her socks on without pain,    Currently in Pain?  Yes    Pain Score  3     Pain Orientation  Left                       [facility] Adult PT Treatment/Exercise - 03/06/20 0001      Knee/Hip Exercises: Stretches   Active Hamstring Stretch  Left;2 reps;20 seconds    Active Hamstring Stretch Limitations  with strap    ITB Stretch  Left;2 reps;20 seconds    ITB Stretch Limitations  with strap    Piriformis Stretch  Left;2 reps;20 seconds    Piriformis Stretch Limitations  bent knee pulled to right      Knee/Hip Exercises: Supine   Quad Sets  Left;10 reps    Quad Sets Limitations  5 sec    Short Arc Quad Sets  Strengthening;Left;10 reps    Heel Slides  AROM;Left;15 reps    Heel Slides Limitations  using slider on mat table    Bridges  Strengthening;Both;10 reps    Straight Leg Raises  Strengthening;Left;10 reps  Other Supine Knee/Hip Exercises  ball squeezes x 10      Manual Therapy   Manual Therapy  Soft tissue mobilization;Myofascial release    Soft tissue mobilization  IASTM to left lateral quad, HS and gastroc; popliteus    Myofascial Release  to left ITB (stripping)             PT Education - 03/06/20 0818    Education Details  HEP progressed    Person(s) Educated  Patient    Methods  Explanation;Demonstration;Handout    Comprehension  Verbalized understanding;Returned demonstration          PT Long Term Goals - 03/04/20 0946      PT LONG TERM GOAL #1   Title  Ind with HEP for strength and flexibility    Status  On-going      PT LONG TERM GOAL #2   Title  Patient able to stand and walk without pain in the left knee.    Time  6    Period  Weeks    Status  On-going      PT LONG TERM GOAL #3   Title  Patient able to don/doff socks without difficulty    Time  6    Period  Weeks    Status  On-going      PT LONG TERM GOAL #4   Title  Patient able to sleep without waking from  knee pain    Time  6    Period  Weeks    Status  On-going      PT LONG TERM GOAL #5   Title  Patient to demo left knee flexion of 0-125 to normalize gait.    Time  6    Period  Weeks    Status  On-going      PT LONG TERM GOAL #6   Title  Pt to demo 5/5 left knee strength to normalize ADLs    Time  6    Period  Weeks    Status  New            Plan - 03/06/20 1902    Clinical Impression Statement  Patient reporting decreased pain today but still with walking at 3/10. She tolerated TE well and demonstrates quad lag when not cued. HEP was progressed today. She tolerated STW fair; better with IASTM. She would benefit from DN along left lateral LE, but is still hesitant. She gets sciatic referral with piriformis stretch and will benefit from increased flexiblity here.    Comorbidities  CVA 2007 (Rt side affected), depression, HTN, RA, leaky heart valve, DM, LBP, scoliosis, restless leg syndrome    PT Treatment/Interventions  ADLs/Self Care Home Management;Cryotherapy;Electrical Stimulation;Iontophoresis 4mg /ml Dexamethasone;Moist Heat;Ultrasound;Therapeutic exercise;Neuromuscular re-education;Patient/family education;Manual techniques;Dry needling;Taping;Vasopneumatic Device;Joint Manipulations    PT Next Visit Plan  STW or DN to left ITB/HS/gluteals/gastroc; knee ROM/strength    PT Home Exercise Plan  [email or web address]       Patient will benefit from skilled therapeutic intervention in order to improve the following deficits and impairments:  Abnormal gait, Increased muscle spasms, Decreased range of motion, Increased edema, Pain, Impaired flexibility, Decreased strength  Visit Diagnosis: Acute pain of left knee  Stiffness of left knee, not elsewhere classified  Localized edema     Problem List Patient Active Problem List   Diagnosis Date Noted  . Depression, major, single episode, mild ([place]) 09/29/2019  . Polyarthralgia 04/21/2019  . Diabetes mellitus without complication ([place])  123XX123  . CVA (cerebral vascular  accident) ([place]) 01/25/2019  . RLS (restless legs syndrome) 01/25/2019  . Glaucoma, left eye 01/25/2019  . Glaucoma associated with ocular inflammation, left, 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  . Nonrheumatic aortic valve insufficiency   . CAD in native artery   . HTN (hypertension) 10/22/2013  . Hyperlipidemia 10/22/2013    Madelyn Flavors PT 03/06/2020, 7:07 PM  [facility] [address], [place], [place], [address] Phone: [phone]   Fax:  [phone]  Name: Cheryl Wood MRN: [id number] Date of Birth: [birth year]

## 2020-03-06 NOTE — Patient Instructions (Addendum)
Access Code: [email or web address] URL: [email or web address] Date: 03/06/2020 Prepared by: Madelyn Flavors  Exercises Supine Heel Slide - 10 reps - 2 sets - 1x daily - 7x weekly Supine ITB Stretch with Strap - 3 reps - 1 sets - 20 sec hold - 2x daily - 7x weekly Supine Hamstring Stretch with Strap - 3 reps - 1 sets - 20 sec hold - 2x daily - 7x weekly Supine Piriformis Stretch with Leg Straight - 3 reps - 1 sets - 20 sec hold - 2x daily - 7x weekly Supine Knee Extension Strengthening - 10 reps - 1-3 sets - 1x daily - 7x weekly Supine Straight Leg Raises - 10 reps - 1-3 sets - 1x daily - 7x weekly Patient Education Trigger Point Dry Needling

## 2020-03-11 ENCOUNTER — Encounter: Payer: Self-pay | Admitting: Physical Therapy

## 2020-03-11 ENCOUNTER — Ambulatory Visit: Payer: Medicare HMO | Admitting: Physical Therapy

## 2020-03-11 ENCOUNTER — Other Ambulatory Visit: Payer: Self-pay

## 2020-03-11 DIAGNOSIS — R600 Localized edema: Secondary | ICD-10-CM | POA: Diagnosis not present

## 2020-03-11 DIAGNOSIS — M25562 Pain in left knee: Secondary | ICD-10-CM | POA: Diagnosis not present

## 2020-03-11 DIAGNOSIS — M25662 Stiffness of left knee, not elsewhere classified: Secondary | ICD-10-CM | POA: Diagnosis not present

## 2020-03-11 NOTE — Therapy (Signed)
[facility] [address] [place], [place], [address] Phone: [phone]   Fax:  [phone]  Physical Therapy Treatment  Patient Details  Name: SHEQUANA HOLLANDSWORTH MRN: [id number] Date of Birth: 09-15-1936 Referring Provider (PT): Orma Flaming MD   Encounter Date: 03/11/2020  PT End of Session - 03/11/20 0757    Visit Number  4    Date for PT Re-Evaluation  04/09/20    PT Start Time  0756    PT Stop Time  0846    PT Time Calculation (min)  50 min    Activity Tolerance  Patient limited by pain    Behavior During Therapy  [facility] for tasks assessed/performed       Past Medical History:  Diagnosis Date  . Allergy   . Arthritis   . Bleeding of blood vessel    ext. genitalia area  . Blood in stool   . Chronic kidney disease   . Depression   . Glaucoma   . Heart murmur   . History of chicken pox   . History of recurrent UTIs   . History of stomach ulcers   . Hyperlipidemia   . Hypertension   . Memory loss   . Rheumatic fever   . Seizures ([place])   . Stroke ([place])   . TIA (transient ischemic attack) 10/22/2013  . Urine incontinence     Past Surgical History:  Procedure Laterality Date  . ABDOMINAL HYSTERECTOMY    . APPENDECTOMY  1983  . CATARACT EXTRACTION Right May 13, 2014  . EYE SURGERY  12/13/2018   stent inserted in left eye  . gum transplant    . RIGHT/LEFT HEART CATH AND CORONARY ANGIOGRAPHY N/A 05/12/2018   Procedure: RIGHT/LEFT HEART CATH AND CORONARY ANGIOGRAPHY;  Surgeon: Belva Crome, MD;  Location: [place] [facility];  Service: Cardiovascular;  Laterality: N/A;  . THORACIC AORTOGRAM N/A 05/12/2018   Procedure: THORACIC AORTOGRAM;  Surgeon: Belva Crome, MD;  Location: [place] [facility];  Service: Cardiovascular;  Laterality: N/A;  . TONSILLECTOMY      There were no vitals filed for this visit.  Subjective Assessment - 03/11/20 0802    Subjective  I have had terrible pain since Friday  afternoon and I don't know why. Nothing I tried this weekend makes it better.    Pertinent History  CVA 2007 (Rt side affected), depression, HTN, RA, leaky heart valve, DM, LBP, scoliosis, restless leg syndrome    Currently in Pain?  Yes    Pain Score  9     Pain Location  Knee    Pain Orientation  Left    Pain Descriptors / Indicators  Stabbing;Aching    Aggravating Factors   Not sure why it has flared    Pain Relieving Factors  nothing as of late    Multiple Pain Sites  No                       [facility] Adult PT Treatment/Exercise - 03/11/20 0001      Ambulation/Gait   Gait Comments  Educated in use of cane, how to fit and use, curbs. Pt demo all properly.       Knee/Hip Exercises: Supine   Heel Slides  AROM;Left;1 set;10 reps      Moist Heat Therapy   Number Minutes Moist Heat  10 Minutes    Moist Heat Location  Knee      Electrical  Stimulation   Electrical Stimulation Location  L lateralposterior knee    Electrical Stimulation Action  IFC    Electrical Stimulation Parameters  80-150 HZ    Electrical Stimulation Goals  Pain      Manual Therapy   Soft tissue mobilization  LT lateral gastroc,  posteriorlateral  LT knee, lateral upper leg.                  PT Long Term Goals - 03/04/20 0946      PT LONG TERM GOAL #1   Title  Ind with HEP for strength and flexibility    Status  On-going      PT LONG TERM GOAL #2   Title  Patient able to stand and walk without pain in the left knee.    Time  6    Period  Weeks    Status  On-going      PT LONG TERM GOAL #3   Title  Patient able to don/doff socks without difficulty    Time  6    Period  Weeks    Status  On-going      PT LONG TERM GOAL #4   Title  Patient able to sleep without waking from knee pain    Time  6    Period  Weeks    Status  On-going      PT LONG TERM GOAL #5   Title  Patient to demo left knee flexion of 0-125 to normalize gait.    Time  6    Period  Weeks    Status   On-going      PT LONG TERM GOAL #6   Title  Pt to demo 5/5 left knee strength to normalize ADLs    Time  6    Period  Weeks    Status  New            Plan - 03/11/20 0757    Clinical Impression Statement  Pt arrives today with 9/10 Lt knee pain. She has an antalgic gait, ambulating very slow and cautious. Ths extends to her bed mobility as well, slow, painful, & cautious. Lt lateral gastroc appears tight but not very tender. Lateral LT leg has reduced myofascial mobility, mildly tender. Pt's goal was pain reduction today. Pain post manual work and Estim was down to 5/10. Pt inquired about using a cane and wanted to know how to use one. PTA educated pt in proper fit of a cane and how to use. Pt was able to demo correclty and reported she experienced less LT knee when using.    Personal Factors and Comorbidities  Comorbidity 3+    Comorbidities  CVA 2007 (Rt side affected), depression, HTN, RA, leaky heart valve, DM, LBP, scoliosis, restless leg syndrome    Examination-Activity Limitations  Dressing;Sleep;Stand    Stability/Clinical Decision Making  Evolving/Moderate complexity    Rehab Potential  Excellent    PT Frequency  2x / week    PT Duration  6 weeks    PT Treatment/Interventions  ADLs/Self Care Home Management;Cryotherapy;Electrical Stimulation;Iontophoresis 4mg /ml Dexamethasone;Moist Heat;Ultrasound;Therapeutic exercise;Neuromuscular re-education;Patient/family education;Manual techniques;Dry needling;Taping;Vasopneumatic Device;Joint Manipulations    PT Next Visit Plan  See if pt got a cane and if it is helping her walk with less pain ( this was pt's idea to get.) Assess pain and if pain reduced resume some gentle exercises, add Nustep.    PT Home Exercise Plan  [id number]    Consulted and Agree with  Plan of Care  Patient       Patient will benefit from skilled therapeutic intervention in order to improve the following deficits and impairments:  Abnormal gait, Increased muscle  spasms, Decreased range of motion, Increased edema, Pain, Impaired flexibility, Decreased strength  Visit Diagnosis: Acute pain of left knee  Stiffness of left knee, not elsewhere classified  Localized edema     Problem List Patient Active Problem List   Diagnosis Date Noted  . Depression, major, single episode, mild ([place]) 09/29/2019  . Polyarthralgia 04/21/2019  . Diabetes mellitus without complication ([place]) 123XX123  . CVA (cerebral vascular accident) ([place]) 01/25/2019  . RLS (restless legs syndrome) 01/25/2019  . Glaucoma, left eye 01/25/2019  . Glaucoma associated with ocular inflammation, left, 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  . Nonrheumatic aortic valve insufficiency   . CAD in native artery   . HTN (hypertension) 10/22/2013  . Hyperlipidemia 10/22/2013    Toye Rouillard, PTA 03/11/2020, 9:29 AM  [facility] [address], [place], [place], [address] Phone: [phone]   Fax:  [phone]  Name: LEV PONCE MRN: [id number] Date of Birth: 1936/06/02

## 2020-03-14 ENCOUNTER — Encounter: Payer: Self-pay | Admitting: Physical Therapy

## 2020-03-14 ENCOUNTER — Ambulatory Visit: Payer: Medicare HMO | Admitting: Physical Therapy

## 2020-03-14 ENCOUNTER — Other Ambulatory Visit: Payer: Self-pay

## 2020-03-14 DIAGNOSIS — M25562 Pain in left knee: Secondary | ICD-10-CM | POA: Diagnosis not present

## 2020-03-14 DIAGNOSIS — M25662 Stiffness of left knee, not elsewhere classified: Secondary | ICD-10-CM

## 2020-03-14 DIAGNOSIS — R600 Localized edema: Secondary | ICD-10-CM | POA: Diagnosis not present

## 2020-03-14 NOTE — Therapy (Signed)
[facility] [address], [place], [place], [address] Phone: [phone]   Fax:  [phone]  Physical Therapy Treatment  Patient Details  Name: [place] MRN: [id number] Date of Birth: Jul 06, 1936 Referring Provider (PT): Orma Flaming MD   Encounter Date: 03/14/2020  PT End of Session - 03/14/20 0753    Visit Number  5    Date for PT Re-Evaluation  04/09/20    Progress Note Due on Visit  10    PT Start Time  0755    PT Stop Time  0850   DN and 10 min of heat at end of session   PT Time Calculation (min)  55 min    Activity Tolerance  Patient limited by pain;Patient tolerated treatment well    Behavior During Therapy  [facility] for tasks assessed/performed       Past Medical History:  Diagnosis Date  . Allergy   . Arthritis   . Bleeding of blood vessel    ext. genitalia area  . Blood in stool   . Chronic kidney disease   . Depression   . Glaucoma   . Heart murmur   . History of chicken pox   . History of recurrent UTIs   . History of stomach ulcers   . Hyperlipidemia   . Hypertension   . Memory loss   . Rheumatic fever   . Seizures ([place])   . Stroke ([place])   . TIA (transient ischemic attack) 10/22/2013  . Urine incontinence     Past Surgical History:  Procedure Laterality Date  . ABDOMINAL HYSTERECTOMY    . APPENDECTOMY  1983  . CATARACT EXTRACTION Right May 13, 2014  . EYE SURGERY  12/13/2018   stent inserted in left eye  . gum transplant    . RIGHT/LEFT HEART CATH AND CORONARY ANGIOGRAPHY N/A 05/12/2018   Procedure: RIGHT/LEFT HEART CATH AND CORONARY ANGIOGRAPHY;  Surgeon: Belva Crome, MD;  Location: [place] [facility];  Service: Cardiovascular;  Laterality: N/A;  . THORACIC AORTOGRAM N/A 05/12/2018   Procedure: THORACIC AORTOGRAM;  Surgeon: Belva Crome, MD;  Location: [place] [facility];  Service: Cardiovascular;  Laterality: N/A;  . TONSILLECTOMY      There were no vitals filed for this  visit.  Subjective Assessment - 03/14/20 0756    Subjective  Pain is at the bend of knee. Patient reports that over a year ago she had sciatica and it was almost unbearable. PT resolved. When I'm walking I have pain over my right hip (low back).    Pertinent History  CVA 2007 (Rt side affected), depression, HTN, RA, leaky heart valve, DM, LBP, scoliosis, restless leg syndrome    Patient Stated Goals  to be able to stand, walk and put her socks on without pain,    Currently in Pain?  Yes    Pain Score  5     Pain Location  Knee    Pain Orientation  Left    Pain Descriptors / Indicators  Aching;Stabbing    Pain Type  Acute pain         [facility] PT Assessment - 03/14/20 0001      AROM   Overall AROM Comments  Lumbar WFL; pain with left SB into left hip    AROM Assessment Site  --                   [facility] Adult PT Treatment/Exercise - 03/14/20 0001  Modalities   Modalities  Moist Heat      Moist Heat Therapy   Number Minutes Moist Heat  10 Minutes    Moist Heat Location  Other (comment)   left HS/gastroc/quads     Manual Therapy   Manual Therapy  Soft tissue mobilization    Manual therapy comments  Skilled palpation and monitoring of soft tissues during DN    Soft tissue mobilization  to Left quads, HS  and gastroc very gentle       Trigger Point Dry Needling - 03/14/20 0001    Consent Given?  Yes    Education Handout Provided  Previously provided    Muscles Treated Lower Quadrant  Vastus medialis;Vastus lateralis;Popliteus;Hamstring;Gastrocnemius    Muscles Treated Back/Hip  Gluteus minimus;Gluteus medius    Dry Needling Comments  left    Vastus lateralis Response  Twitch response elicited;Palpable increased muscle length    Vastus medialis Response  Twitch response elicited;Palpable increased muscle length    Popliteus Response  Twitch response elicited;Palpable increased muscle length    Hamstring Response  Twitch response elicited;Palpable increased muscle  length    Gastrocnemius Response  Twitch response elicited;Palpable increased muscle length    Gluteus Minimus Response  Palpable increased muscle length    Gluteus Medius Response  Palpable increased muscle length                PT Long Term Goals - 03/14/20 0845      PT LONG TERM GOAL #1   Title  Ind with HEP for strength and flexibility    Status  On-going      PT LONG TERM GOAL #2   Title  Patient able to stand and walk without pain in the left knee.    Status  On-going      PT LONG TERM GOAL #3   Title  Patient able to don/doff socks without difficulty    Baseline  no change    Status  On-going      PT LONG TERM GOAL #4   Title  Patient able to sleep without waking from knee pain    Status  Achieved      PT LONG TERM GOAL #5   Title  Patient to demo left knee flexion of 0-125 to normalize gait.    Status  On-going            Plan - 03/14/20 0846    Clinical Impression Statement  Patient reporting 5/10 pain today. She started using a cane which has helped with pain while walking. Back was assessed. She does have pain in the left gluteals with left SB but otherwise no increased sx with flexion or extension. Positioning did not change LE sx. She continues to have marked tenderness in left lateral quads, ITB, distal HS and lateral gastroc. DN was performed today with very good response. Patient reported no pain upon standing and was able to actively flex knee without pain. Marland Kitchen    PT Treatment/Interventions  ADLs/Self Care Home Management;Cryotherapy;Electrical Stimulation;Iontophoresis 4mg /ml Dexamethasone;Moist Heat;Ultrasound;Therapeutic exercise;Neuromuscular re-education;Patient/family education;Manual techniques;Dry needling;Taping;Vasopneumatic Device;Joint Manipulations    PT Next Visit Plan  Assess response to DN. If continued improvement try HS isometrics, continued STW/IASTM to lateral left leg. Heel slide held due to pain today.    PT Home Exercise Plan   [id number]    Consulted and Agree with Plan of Care  Patient       Patient will benefit from skilled therapeutic intervention in order to improve  the following deficits and impairments:  Abnormal gait, Increased muscle spasms, Decreased range of motion, Increased edema, Pain, Impaired flexibility, Decreased strength  Visit Diagnosis: Acute pain of left knee  Stiffness of left knee, not elsewhere classified  Localized edema     Problem List Patient Active Problem List   Diagnosis Date Noted  . Depression, major, single episode, mild ([place]) 09/29/2019  . Polyarthralgia 04/21/2019  . Diabetes mellitus without complication ([place]) 123XX123  . CVA (cerebral vascular accident) ([place]) 01/25/2019  . RLS (restless legs syndrome) 01/25/2019  . Glaucoma, left eye 01/25/2019  . Glaucoma associated with ocular inflammation, left, 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  . Nonrheumatic aortic valve insufficiency   . CAD in native artery   . HTN (hypertension) 10/22/2013  . Hyperlipidemia 10/22/2013    Madelyn Flavors PT 03/14/2020, 9:06 AM  [facility] [address], [place], [place], [address] Phone: [phone]   Fax:  [phone]  Name: [place] MRN: [id number] Date of Birth: 12-31-1935

## 2020-03-18 ENCOUNTER — Other Ambulatory Visit: Payer: Self-pay

## 2020-03-18 ENCOUNTER — Encounter: Payer: Self-pay | Admitting: Physical Therapy

## 2020-03-18 ENCOUNTER — Ambulatory Visit: Payer: Medicare HMO | Admitting: Physical Therapy

## 2020-03-18 DIAGNOSIS — M25662 Stiffness of left knee, not elsewhere classified: Secondary | ICD-10-CM | POA: Diagnosis not present

## 2020-03-18 DIAGNOSIS — M25562 Pain in left knee: Secondary | ICD-10-CM

## 2020-03-18 DIAGNOSIS — R600 Localized edema: Secondary | ICD-10-CM | POA: Diagnosis not present

## 2020-03-18 NOTE — Therapy (Signed)
[name] [address] [place], [place], [address] Phone: [phone]   Fax:  [phone]  Physical Therapy Treatment  Patient Details  Name: [place] MRN: [id number] Date of Birth: August 11, 1936 Referring Provider (PT): Orma Flaming MD   Encounter Date: 03/18/2020  PT End of Session - 03/18/20 0837    Visit Number  6    Date for PT Re-Evaluation  04/09/20    Progress Note Due on Visit  10    PT Start Time  0837    PT Stop Time  0935    PT Time Calculation (min)  58 min    Behavior During Therapy  [facility] for tasks assessed/performed       Past Medical History:  Diagnosis Date  . Allergy   . Arthritis   . Bleeding of blood vessel    ext. genitalia area  . Blood in stool   . Chronic kidney disease   . Depression   . Glaucoma   . Heart murmur   . History of chicken pox   . History of recurrent UTIs   . History of stomach ulcers   . Hyperlipidemia   . Hypertension   . Memory loss   . Rheumatic fever   . Seizures ([place])   . Stroke ([place])   . TIA (transient ischemic attack) 10/22/2013  . Urine incontinence     Past Surgical History:  Procedure Laterality Date  . ABDOMINAL HYSTERECTOMY    . APPENDECTOMY  1983  . CATARACT EXTRACTION Right May 13, 2014  . EYE SURGERY  12/13/2018   stent inserted in left eye  . gum transplant    . RIGHT/LEFT HEART CATH AND CORONARY ANGIOGRAPHY N/A 05/12/2018   Procedure: RIGHT/LEFT HEART CATH AND CORONARY ANGIOGRAPHY;  Surgeon: Belva Crome, MD;  Location: [place] [facility];  Service: Cardiovascular;  Laterality: N/A;  . THORACIC AORTOGRAM N/A 05/12/2018   Procedure: THORACIC AORTOGRAM;  Surgeon: Belva Crome, MD;  Location: [place] [facility];  Service: Cardiovascular;  Laterality: N/A;  . TONSILLECTOMY      There were no vitals filed for this visit.  Subjective Assessment - 03/18/20 0842    Subjective  Sometimes when I am sitting I have no pain, but when I  walk, stand and bend the knee tha is the worse pain. The dry needling helped a lot.    Pertinent History  CVA 2007 (Rt side affected), depression, HTN, RA, leaky heart valve, DM, LBP, scoliosis, restless leg syndrome    Currently in Pain?  Yes    Pain Score  5     Pain Location  Knee    Pain Orientation  Left    Aggravating Factors   Bending the knee    Pain Relieving Factors  Dry needling    Multiple Pain Sites  No                       [facility] Adult PT Treatment/Exercise - 03/18/20 0001      Knee/Hip Exercises: Stretches   Active Hamstring Stretch  --   seated 3x 15 sec      Knee/Hip Exercises: Aerobic   Nustep  L1 x 5 min with PTA present to monitor pain. Seat 9      Knee/Hip Exercises: Seated   Heel Slides  --   Seated with floor slider 10x     Electrical Stimulation   Electrical Stimulation Location  Lt hamstring,  posterior knee,     Electrical Stimulation Action  IFC    Electrical Stimulation Parameters  80-150 HZ    Electrical Stimulation Goals  Pain      Manual Therapy   Manual Therapy  Soft tissue mobilization    Soft tissue mobilization  Lt quads, hamstrings, ITband gastroc                  PT Long Term Goals - 03/14/20 0845      PT LONG TERM GOAL #1   Title  Ind with HEP for strength and flexibility    Status  On-going      PT LONG TERM GOAL #2   Title  Patient able to stand and walk without pain in the left knee.    Status  On-going      PT LONG TERM GOAL #3   Title  Patient able to don/doff socks without difficulty    Baseline  no change    Status  On-going      PT LONG TERM GOAL #4   Title  Patient able to sleep without waking from knee pain    Status  Achieved      PT LONG TERM GOAL #5   Title  Patient to demo left knee flexion of 0-125 to normalize gait.    Status  On-going            Plan - 03/18/20 0902    Clinical Impression Statement  Pt continues to report pain mainly when bending her knee. She does  report times of no pain ( when sitting ) and pain intermittently when walking/weightbearing. Lt hamstrings and ITband fairly taught and moderately tender. Pt was able to successfully ride the Nustep for 5 min pain free.    Personal Factors and Comorbidities  Comorbidity 3+    Comorbidities  CVA 2007 (Rt side affected), depression, HTN, RA, leaky heart valve, DM, LBP, scoliosis, restless leg syndrome    Examination-Activity Limitations  Dressing;Sleep;Stand    Stability/Clinical Decision Making  Evolving/Moderate complexity    Rehab Potential  Excellent    PT Frequency  2x / week    PT Duration  6 weeks    PT Treatment/Interventions  ADLs/Self Care Home Management;Cryotherapy;Electrical Stimulation;Iontophoresis 4mg /ml Dexamethasone;Moist Heat;Ultrasound;Therapeutic exercise;Neuromuscular re-education;Patient/family education;Manual techniques;Dry needling;Taping;Vasopneumatic Device;Joint Manipulations    PT Next Visit Plan  Nustep, DN to hamstrings, ITband and anywhere else pt may need. Pain free knee flexion. Give seated ham stretch for HEP if pt doing ok.    PT Home Exercise Plan  [id number]    Consulted and Agree with Plan of Care  Patient       Patient will benefit from skilled therapeutic intervention in order to improve the following deficits and impairments:  Abnormal gait, Increased muscle spasms, Decreased range of motion, Increased edema, Pain, Impaired flexibility, Decreased strength  Visit Diagnosis: Acute pain of left knee  Stiffness of left knee, not elsewhere classified     Problem List Patient Active Problem List   Diagnosis Date Noted  . Depression, major, single episode, mild ([place]) 09/29/2019  . Polyarthralgia 04/21/2019  . Diabetes mellitus without complication ([place]) 123XX123  . CVA (cerebral vascular accident) ([place]) 01/25/2019  . RLS (restless legs syndrome) 01/25/2019  . Glaucoma, left eye 01/25/2019  . Glaucoma associated with ocular inflammation, left,  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  . Nonrheumatic aortic valve insufficiency   . CAD in native artery   . HTN (hypertension) 10/22/2013  . Hyperlipidemia 10/22/2013  Addysyn Fern, PTA 03/18/2020, 9:26 AM  [facility] [address], [place], [place], [address] Phone: [phone]   Fax:  [phone]  Name: CHENIYA RYBKA MRN: [id number] Date of Birth: 10/05/1936

## 2020-03-20 DIAGNOSIS — M0600 Rheumatoid arthritis without rheumatoid factor, unspecified site: Secondary | ICD-10-CM | POA: Diagnosis not present

## 2020-03-21 ENCOUNTER — Encounter: Payer: Self-pay | Admitting: Physical Therapy

## 2020-03-21 ENCOUNTER — Ambulatory Visit: Payer: Medicare HMO | Admitting: Physical Therapy

## 2020-03-21 ENCOUNTER — Other Ambulatory Visit: Payer: Self-pay

## 2020-03-21 DIAGNOSIS — R600 Localized edema: Secondary | ICD-10-CM

## 2020-03-21 DIAGNOSIS — M25662 Stiffness of left knee, not elsewhere classified: Secondary | ICD-10-CM

## 2020-03-21 DIAGNOSIS — M25562 Pain in left knee: Secondary | ICD-10-CM

## 2020-03-21 NOTE — Therapy (Signed)
[facility] [address] [place], [place], [address] Phone: [phone]   Fax:  [phone]  Physical Therapy Treatment  Patient Details  Name: Cheryl Wood MRN: [id number] Date of Birth: 1936/04/21 Referring Provider (PT): Orma Flaming MD   Encounter Date: 03/21/2020  PT End of Session - 03/21/20 0852    Visit Number  7    Date for PT Re-Evaluation  04/09/20    Progress Note Due on Visit  10    PT Start Time  0847    PT Stop Time  0940    PT Time Calculation (min)  53 min    Activity Tolerance  Patient tolerated treatment well    Behavior During Therapy  [facility] for tasks assessed/performed       Past Medical History:  Diagnosis Date  . Allergy   . Arthritis   . Bleeding of blood vessel    ext. genitalia area  . Blood in stool   . Chronic kidney disease   . Depression   . Glaucoma   . Heart murmur   . History of chicken pox   . History of recurrent UTIs   . History of stomach ulcers   . Hyperlipidemia   . Hypertension   . Memory loss   . Rheumatic fever   . Seizures ([place])   . Stroke ([place])   . TIA (transient ischemic attack) 10/22/2013  . Urine incontinence     Past Surgical History:  Procedure Laterality Date  . ABDOMINAL HYSTERECTOMY    . APPENDECTOMY  1983  . CATARACT EXTRACTION Right May 13, 2014  . EYE SURGERY  12/13/2018   stent inserted in left eye  . gum transplant    . RIGHT/LEFT HEART CATH AND CORONARY ANGIOGRAPHY N/A 05/12/2018   Procedure: RIGHT/LEFT HEART CATH AND CORONARY ANGIOGRAPHY;  Surgeon: Belva Crome, MD;  Location: [place] [facility];  Service: Cardiovascular;  Laterality: N/A;  . THORACIC AORTOGRAM N/A 05/12/2018   Procedure: THORACIC AORTOGRAM;  Surgeon: Belva Crome, MD;  Location: [place] [facility];  Service: Cardiovascular;  Laterality: N/A;  . TONSILLECTOMY      There were no vitals filed for this visit.  Subjective Assessment - 03/21/20 0852    Subjective   It's much better. I can bend my knee to get in the car a whole lot better. The morning is the worst bending my knee to get my sock on. Standing does not bother me. A little bit of walking it starts to hurt me. I put back brace on today because my back is hurting.    Pertinent History  CVA 2007 (Rt side affected), depression, HTN, RA, leaky heart valve, DM, LBP, scoliosis, restless leg syndrome    Patient Stated Goals  to be able to stand, walk and put her socks on without pain,    Currently in Pain?  Yes    Pain Score  4     Pain Location  Knee    Pain Orientation  Left    Pain Descriptors / Indicators  Aching                       [facility] Adult PT Treatment/Exercise - 03/21/20 0001      Knee/Hip Exercises: Stretches   Active Hamstring Stretch  Left;30 seconds;3 reps    Active Hamstring Stretch Limitations  with foot on stool      Knee/Hip Exercises: Aerobic   Nustep  L1 x 5 min with PT present to monitor pain. Seat 9      Knee/Hip Exercises: Supine   Quad Sets  Left;10 reps    Quad Sets Limitations  5 sec    Short Arc Quad Sets  Left;1 set;10 reps    Short Arc Quad Sets Limitations  5 sec      Modalities   Modalities  Moist Heat      Moist Heat Therapy   Number Minutes Moist Heat  10 Minutes    Moist Heat Location  Other (comment)   left quads/HS/adductors in [facility]     Manual Therapy   Manual Therapy  Soft tissue mobilization    Manual therapy comments  Skilled palpation and monitoring of soft tissues during DN    Soft tissue mobilization  to left quads, HS and adductors       Trigger Point Dry Needling - 03/21/20 0001    Consent Given?  Yes    Education Handout Provided  Previously provided    Muscles Treated Lower Quadrant  Adductor longus/brevis/magnus;Vastus lateralis;Vastus medialis;Popliteus;Hamstring    Dry Needling Comments  left    Adductor Response  Twitch response elicited;Palpable increased muscle length    Vastus lateralis Response  Twitch  response elicited;Palpable increased muscle length    Vastus medialis Response  Twitch response elicited;Palpable increased muscle length    Popliteus Response  Palpable increased muscle length    Hamstring Response  Twitch response elicited;Palpable increased muscle length                PT Long Term Goals - 03/21/20 1519      PT LONG TERM GOAL #1   Title  Ind with HEP for strength and flexibility    Status  On-going      PT LONG TERM GOAL #2   Title  Patient able to stand and walk without pain in the left knee.    Baseline  improving but still present    Status  On-going      PT LONG TERM GOAL #3   Title  Patient able to don/doff socks without difficulty    Status  On-going      PT LONG TERM GOAL #4   Title  Patient able to sleep without waking from knee pain    Status  Achieved            Plan - 03/21/20 0932    Clinical Impression Statement  Patient reporting significant improvement since DN. She is still having increased pain in the morning with knee flexion and after a little bit of walking. Very good response to DN and manual therapy in distal thigh. Holding exercises that cause pain. Increased endurance with walking, but still limited with bending knee to don socks/pants.    Comorbidities  CVA 2007 (Rt side affected), depression, HTN, RA, leaky heart valve, DM, LBP, scoliosis, restless leg syndrome    PT Treatment/Interventions  ADLs/Self Care Home Management;Cryotherapy;Electrical Stimulation;Iontophoresis 4mg /ml Dexamethasone;Moist Heat;Ultrasound;Therapeutic exercise;Neuromuscular re-education;Patient/family education;Manual techniques;Dry needling;Taping;Vasopneumatic Device;Joint Manipulations    PT Next Visit Plan  Check ROM/strength; Nustep,  Pain free knee flexion. Give seated ham stretch for HEP if pt doing ok.    PT Home Exercise Plan  [id number]       Patient will benefit from skilled therapeutic intervention in order to improve the following  deficits and impairments:  Abnormal gait, Increased muscle spasms, Decreased range of motion, Increased edema, Pain, Impaired flexibility, Decreased strength  Visit Diagnosis: Acute pain  of left knee  Stiffness of left knee, not elsewhere classified  Localized edema     Problem List Patient Active Problem List   Diagnosis Date Noted  . Depression, major, single episode, mild ([place]) 09/29/2019  . Polyarthralgia 04/21/2019  . Diabetes mellitus without complication ([place]) 123XX123  . CVA (cerebral vascular accident) ([place]) 01/25/2019  . RLS (restless legs syndrome) 01/25/2019  . Glaucoma, left eye 01/25/2019  . Glaucoma associated with ocular inflammation, left, 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  . Nonrheumatic aortic valve insufficiency   . CAD in native artery   . HTN (hypertension) 10/22/2013  . Hyperlipidemia 10/22/2013    Madelyn Flavors PT 03/21/2020, 3:22 PM  [facility] [address], [place], [place], [address] Phone: [phone]   Fax:  [phone]  Name: Cheryl Wood MRN: [id number] Date of Birth: May 05, 1936

## 2020-03-25 ENCOUNTER — Other Ambulatory Visit: Payer: Self-pay

## 2020-03-25 ENCOUNTER — Ambulatory Visit
Admission: RE | Admit: 2020-03-25 | Discharge: 2020-03-25 | Disposition: A | Discharge status: Home / Self Care | Payer: Medicare HMO | Source: Ambulatory Visit | Attending: Family Medicine | Admitting: Family Medicine

## 2020-03-25 DIAGNOSIS — H4042X2 Glaucoma secondary to eye inflammation, left eye, moderate stage: Secondary | ICD-10-CM | POA: Diagnosis not present

## 2020-03-25 DIAGNOSIS — Z1231 Encounter for screening mammogram for malignant neoplasm of breast: Secondary | ICD-10-CM | POA: Diagnosis not present

## 2020-03-26 ENCOUNTER — Ambulatory Visit: Payer: Medicare HMO | Admitting: Physical Therapy

## 2020-03-26 ENCOUNTER — Other Ambulatory Visit: Payer: Self-pay

## 2020-03-26 ENCOUNTER — Encounter: Payer: Self-pay | Admitting: Physical Therapy

## 2020-03-26 DIAGNOSIS — R600 Localized edema: Secondary | ICD-10-CM | POA: Diagnosis not present

## 2020-03-26 DIAGNOSIS — M25562 Pain in left knee: Secondary | ICD-10-CM | POA: Diagnosis not present

## 2020-03-26 DIAGNOSIS — M25662 Stiffness of left knee, not elsewhere classified: Secondary | ICD-10-CM

## 2020-03-26 NOTE — Therapy (Signed)
[facility] [address], [place], [place], [address] Phone: [phone]   Fax:  [phone]  Physical Therapy Treatment  Patient Details  Name: Cheryl Wood MRN: [id number] Date of Birth: [birth year] Referring Provider (PT): Orma Flaming MD   Encounter Date: 03/26/2020  PT End of Session - 03/26/20 0846    Visit Number  8    Date for PT Re-Evaluation  04/09/20    PT Start Time  0841    PT Stop Time  0921    PT Time Calculation (min)  40 min    Activity Tolerance  Patient tolerated treatment well    Behavior During Therapy  [facility] for tasks assessed/performed       Past Medical History:  Diagnosis Date  . Allergy   . Arthritis   . Bleeding of blood vessel    ext. genitalia area  . Blood in stool   . Chronic kidney disease   . Depression   . Glaucoma   . Heart murmur   . History of chicken pox   . History of recurrent UTIs   . History of stomach ulcers   . Hyperlipidemia   . Hypertension   . Memory loss   . Rheumatic fever   . Seizures ([place])   . Stroke ([place])   . TIA (transient ischemic attack) 10/22/2013  . Urine incontinence     Past Surgical History:  Procedure Laterality Date  . ABDOMINAL HYSTERECTOMY    . APPENDECTOMY  1983  . CATARACT EXTRACTION Right May 13, 2014  . EYE SURGERY  12/13/2018   stent inserted in left eye  . gum transplant    . RIGHT/LEFT HEART CATH AND CORONARY ANGIOGRAPHY N/A 05/12/2018   Procedure: RIGHT/LEFT HEART CATH AND CORONARY ANGIOGRAPHY;  Surgeon: Belva Crome, MD;  Location: [place] [facility];  Service: Cardiovascular;  Laterality: N/A;  . THORACIC AORTOGRAM N/A 05/12/2018   Procedure: THORACIC AORTOGRAM;  Surgeon: Belva Crome, MD;  Location: [place] [facility];  Service: Cardiovascular;  Laterality: N/A;  . TONSILLECTOMY      There were no vitals filed for this visit.  Subjective Assessment - 03/26/20 0847    Subjective  Sitting a lot like when I had  doctors appointments yesterday it gets stiff and sore.    Patient Stated Goals  to be able to stand, walk and put her socks on without pain,    Currently in Pain?  Yes    Pain Score  3     Pain Location  Knee    Pain Orientation  Left    Pain Descriptors / Indicators  Aching    Pain Type  Acute pain    Pain Onset  More than a month ago    Multiple Pain Sites  No         [facility] PT Assessment - 03/26/20 0001      AROM   Left Knee Flexion  113   after flexion warm up with strap                  [facility] Adult PT Treatment/Exercise - 03/26/20 0001      Knee/Hip Exercises: Stretches   Active Hamstring Stretch  Left;30 seconds;3 reps    Active Hamstring Stretch Limitations  seated    Piriformis Stretch  Left;2 reps;20 seconds   supine figure 4     Knee/Hip Exercises: Aerobic   Nustep  L1 x 5 min with PT present  to monitor pain. Seat 9      Knee/Hip Exercises: Standing   Knee Flexion  Both;10 reps      Knee/Hip Exercises: Supine   Short Arc Quad Sets  Left;1 set;10 reps    Short Arc Quad Sets Limitations  4 lb - 3 sec - needed TC to maintain pelvic tilt    Straight Leg Raises  Strengthening;Left;10 reps    Other Supine Knee/Hip Exercises  clam with green band  3 sec hold x10      Manual Therapy   Soft tissue mobilization  Lt quads, hamstrings, adductors   adductors very TTP very light touch increased as able                 PT Long Term Goals - 03/21/20 1519      PT LONG TERM GOAL #1   Title  Ind with HEP for strength and flexibility    Status  On-going      PT LONG TERM GOAL #2   Title  Patient able to stand and walk without pain in the left knee.    Baseline  improving but still present    Status  On-going      PT LONG TERM GOAL #3   Title  Patient able to don/doff socks without difficulty    Status  On-going      PT LONG TERM GOAL #4   Title  Patient able to sleep without waking from knee pain    Status  Achieved            Plan  - 03/26/20 0926    Clinical Impression Statement  Pt was doing better overall.  Pt reports the long duration stretches help so seated hamstring added to HEP in order to manage pain better when sitting a long time.  Pt able to increase resistance in SAQ set.  She demonstrates improved ROM of 112 today after stretches.  pt was very TTP along adductors but improved with STM.  Pt will continue to benefit from skilled PT to work on strength and ROM    Comorbidities  CVA 2007 (Rt side affected), depression, HTN, RA, leaky heart valve, DM, LBP, scoliosis, restless leg syndrome    PT Treatment/Interventions  ADLs/Self Care Home Management;Cryotherapy;Electrical Stimulation;Iontophoresis 4mg /ml Dexamethasone;Moist Heat;Ultrasound;Therapeutic exercise;Neuromuscular re-education;Patient/family education;Manual techniques;Dry needling;Taping;Vasopneumatic Device;Joint Manipulations    PT Next Visit Plan  f/u on stretches added toHEP; nustep; ROM; progress strength as tolerated    PT Home Exercise Plan  [email or web address]    Consulted and Agree with Plan of Care  Patient       Patient will benefit from skilled therapeutic intervention in order to improve the following deficits and impairments:  Abnormal gait, Increased muscle spasms, Decreased range of motion, Increased edema, Pain, Impaired flexibility, Decreased strength  Visit Diagnosis: Acute pain of left knee  Stiffness of left knee, not elsewhere classified  Localized edema     Problem List Patient Active Problem List   Diagnosis Date Noted  . Depression, major, single episode, mild ([place]) 09/29/2019  . Polyarthralgia 04/21/2019  . Diabetes mellitus without complication ([place]) 123XX123  . CVA (cerebral vascular accident) ([place]) 01/25/2019  . RLS (restless legs syndrome) 01/25/2019  . Glaucoma, left eye 01/25/2019  . Glaucoma associated with ocular inflammation, left, 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  . Nonrheumatic aortic valve insufficiency   . CAD in  native artery   . HTN (hypertension) 10/22/2013  . Hyperlipidemia 10/22/2013    Jule Ser, PT 03/26/2020, 9:29  AM  [facility] [address], [place], [place], [address] Phone: [phone]   Fax:  [phone]  Name: Cheryl Wood MRN: [id number] Date of Birth: 1936/05/23

## 2020-03-28 ENCOUNTER — Other Ambulatory Visit: Payer: Self-pay

## 2020-03-28 ENCOUNTER — Ambulatory Visit: Payer: Medicare HMO | Attending: Family Medicine | Admitting: Physical Therapy

## 2020-03-28 DIAGNOSIS — M25662 Stiffness of left knee, not elsewhere classified: Secondary | ICD-10-CM

## 2020-03-28 DIAGNOSIS — R600 Localized edema: Secondary | ICD-10-CM | POA: Diagnosis not present

## 2020-03-28 DIAGNOSIS — M25562 Pain in left knee: Secondary | ICD-10-CM

## 2020-03-28 NOTE — Therapy (Signed)
[facility] [address], [place], [place], [address] Phone: [phone]   Fax:  [phone]  Physical Therapy Treatment  Patient Details  Name: SARAHJEAN HUNTE MRN: [id number] Date of Birth: 07/21/1936 Referring Provider (PT): Orma Flaming MD   Encounter Date: 03/28/2020  PT End of Session - 03/28/20 0925    Visit Number  9    Date for PT Re-Evaluation  04/09/20    PT Start Time  0924    PT Stop Time  1004    PT Time Calculation (min)  40 min    Activity Tolerance  Patient tolerated treatment well    Behavior During Therapy  [facility] for tasks assessed/performed       Past Medical History:  Diagnosis Date  . Allergy   . Arthritis   . Bleeding of blood vessel    ext. genitalia area  . Blood in stool   . Chronic kidney disease   . Depression   . Glaucoma   . Heart murmur   . History of chicken pox   . History of recurrent UTIs   . History of stomach ulcers   . Hyperlipidemia   . Hypertension   . Memory loss   . Rheumatic fever   . Seizures ([place])   . Stroke ([place])   . TIA (transient ischemic attack) 10/22/2013  . Urine incontinence     Past Surgical History:  Procedure Laterality Date  . ABDOMINAL HYSTERECTOMY    . APPENDECTOMY  1983  . CATARACT EXTRACTION Right May 13, 2014  . EYE SURGERY  12/13/2018   stent inserted in left eye  . gum transplant    . RIGHT/LEFT HEART CATH AND CORONARY ANGIOGRAPHY N/A 05/12/2018   Procedure: RIGHT/LEFT HEART CATH AND CORONARY ANGIOGRAPHY;  Surgeon: Belva Crome, MD;  Location: [place] [facility];  Service: Cardiovascular;  Laterality: N/A;  . THORACIC AORTOGRAM N/A 05/12/2018   Procedure: THORACIC AORTOGRAM;  Surgeon: Belva Crome, MD;  Location: [place] [facility];  Service: Cardiovascular;  Laterality: N/A;  . TONSILLECTOMY      There were no vitals filed for this visit.  Subjective Assessment - 03/28/20 0926    Subjective  I am feeling a whole lot better.  The  stretches help.  I had an easier time getting my pants on.  It was so hard sitting on the commode because I couldn't bend the knee and it has been easier the last couple of days.  Pt denies pain this morning    Pertinent History  CVA 2007 (Rt side affected), depression, HTN, RA, leaky heart valve, DM, LBP, scoliosis, restless leg syndrome    Limitations  Standing;Walking    Patient Stated Goals  to be able to stand, walk and put her socks on without pain,    Currently in Pain?  No/denies                       [facility] Adult PT Treatment/Exercise - 03/28/20 0001      Knee/Hip Exercises: Stretches   Active Hamstring Stretch  Right;Left;3 reps;30 seconds    Piriformis Stretch  Left;2 reps;20 seconds   supine figure 4     Knee/Hip Exercises: Aerobic   Nustep  L2 x 5 min with PT present to monitor pain. Seat 9      Knee/Hip Exercises: Standing   Knee Flexion  Both;10 reps;Strengthening    Hip Abduction  Stengthening;Both;10 reps  Functional Squat  10 reps    Functional Squat Limitations  mini squat TC for technique      Knee/Hip Exercises: Supine   Short Arc Quad Sets  Left;1 set;10 reps    Short Arc Quad Sets Limitations  4lb    Straight Leg Raises  Strengthening;Left;10 reps   add 1.5#     Knee/Hip Exercises: Sidelying   Clams  2x 10 Lt LE      Manual Therapy   Soft tissue mobilization  Lt quads, hamstrings, adductors   adductors only mildly TTP today                 PT Long Term Goals - 03/21/20 1519      PT LONG TERM GOAL #1   Title  Ind with HEP for strength and flexibility    Status  On-going      PT LONG TERM GOAL #2   Title  Patient able to stand and walk without pain in the left knee.    Baseline  improving but still present    Status  On-going      PT LONG TERM GOAL #3   Title  Patient able to don/doff socks without difficulty    Status  On-going      PT LONG TERM GOAL #4   Title  Patient able to sleep without waking from knee pain     Status  Achieved            Plan - 03/28/20 1009    Clinical Impression Statement  Pt is feeling much better. She has less tenderness around the knee.  Pt was able to porgress strength with standing hip and mini squats.  Pt favors using quads instead of core and glutes with squats and needed heavy TC to perform correctly.  She will continue to benefit from skilled PT to improve strength and continue with POC    PT Treatment/Interventions  ADLs/Self Care Home Management;Cryotherapy;Electrical Stimulation;Iontophoresis 4mg /ml Dexamethasone;Moist Heat;Ultrasound;Therapeutic exercise;Neuromuscular re-education;Patient/family education;Manual techniques;Dry needling;Taping;Vasopneumatic Device;Joint Manipulations    PT Next Visit Plan  add clam and exercise progressions needed for core and glute strength to HEP; porgress strength as tolerated; knee flexion ROM measure    PT Home Exercise Plan  [email or web address]    Consulted and Agree with Plan of Care  Patient       Patient will benefit from skilled therapeutic intervention in order to improve the following deficits and impairments:  Abnormal gait, Increased muscle spasms, Decreased range of motion, Increased edema, Pain, Impaired flexibility, Decreased strength  Visit Diagnosis: Acute pain of left knee  Stiffness of left knee, not elsewhere classified  Localized edema     Problem List Patient Active Problem List   Diagnosis Date Noted  . Depression, major, single episode, mild ([place]) 09/29/2019  . Polyarthralgia 04/21/2019  . Diabetes mellitus without complication ([place]) 123XX123  . CVA (cerebral vascular accident) ([place]) 01/25/2019  . RLS (restless legs syndrome) 01/25/2019  . Glaucoma, left eye 01/25/2019  . Glaucoma associated with ocular inflammation, left, 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  . Nonrheumatic aortic valve insufficiency   . CAD in native artery   . HTN (hypertension) 10/22/2013  . Hyperlipidemia 10/22/2013    Jule Ser, PT 03/28/2020, 10:13 AM  [facility] [address], [place], [place], [address] Phone: [phone]   Fax:  [phone]  Name: ELIANIE MAMON MRN: [id number] Date of Birth: 1936/12/26

## 2020-04-01 ENCOUNTER — Ambulatory Visit: Payer: Medicare HMO | Admitting: Physical Therapy

## 2020-04-01 ENCOUNTER — Other Ambulatory Visit: Payer: Self-pay

## 2020-04-01 ENCOUNTER — Encounter: Payer: Self-pay | Admitting: Physical Therapy

## 2020-04-01 DIAGNOSIS — R600 Localized edema: Secondary | ICD-10-CM

## 2020-04-01 DIAGNOSIS — M25562 Pain in left knee: Secondary | ICD-10-CM

## 2020-04-01 DIAGNOSIS — M25662 Stiffness of left knee, not elsewhere classified: Secondary | ICD-10-CM

## 2020-04-01 NOTE — Therapy (Addendum)
[facility] [address] [place], [place], [address] Phone: [phone]   Fax:  [phone]  Physical Therapy Treatment Progress Note Reporting Period 02/27/20 to 04/01/20  See note below for Objective Data and Assessment of Progress/Goals.   Gustavus Bryant, PT 04/07/20 12:22 AM    Patient Details  Name: Cheryl Wood MRN: [id number] Date of Birth: [birth year] Referring Provider (PT): Orma Flaming MD   Encounter Date: 04/01/2020  PT End of Session - 04/01/20 0847    Visit Number  10    Date for PT Re-Evaluation  04/09/20    PT Start Time  0845    PT Stop Time  0927    PT Time Calculation (min)  42 min    Activity Tolerance  Patient tolerated treatment well    Behavior During Therapy  [facility] for tasks assessed/performed       Past Medical History:  Diagnosis Date  . Allergy   . Arthritis   . Bleeding of blood vessel    ext. genitalia area  . Blood in stool   . Chronic kidney disease   . Depression   . Glaucoma   . Heart murmur   . History of chicken pox   . History of recurrent UTIs   . History of stomach ulcers   . Hyperlipidemia   . Hypertension   . Memory loss   . Rheumatic fever   . Seizures ([place])   . Stroke ([place])   . TIA (transient ischemic attack) 10/22/2013  . Urine incontinence     Past Surgical History:  Procedure Laterality Date  . ABDOMINAL HYSTERECTOMY    . APPENDECTOMY  1983  . CATARACT EXTRACTION Right May 13, 2014  . EYE SURGERY  12/13/2018   stent inserted in left eye  . gum transplant    . RIGHT/LEFT HEART CATH AND CORONARY ANGIOGRAPHY N/A 05/12/2018   Procedure: RIGHT/LEFT HEART CATH AND CORONARY ANGIOGRAPHY;  Surgeon: Belva Crome, MD;  Location: [place] [facility];  Service: Cardiovascular;  Laterality: N/A;  . THORACIC AORTOGRAM N/A 05/12/2018   Procedure: THORACIC AORTOGRAM;  Surgeon: Belva Crome, MD;  Location: [place] [facility];  Service: Cardiovascular;  Laterality:  N/A;  . TONSILLECTOMY      There were no vitals filed for this visit.  Subjective Assessment - 04/01/20 0848    Subjective  I can get my socks and pants on better. I would like that to be even easier.    Pertinent History  CVA 2007 (Rt side affected), depression, HTN, RA, leaky heart valve, DM, LBP, scoliosis, restless leg syndrome    Currently in Pain?  No/denies    Multiple Pain Sites  No         [facility] PT Assessment - 04/01/20 0001      Assessment   Medical Diagnosis  acute left knee pain    Referring Provider (PT)  Orma Flaming MD      Observation/Other Assessments   Focus on Therapeutic Outcomes (FOTO)   57% limited      AROM   Left Knee Flexion  119   supine     Strength   Overall Strength Comments  Lt quad 5/5, hip flexor  4+/5                   [facility] Adult PT Treatment/Exercise - 04/01/20 0001      Knee/Hip Exercises: Stretches   Active Hamstring Stretch  Left;3 reps;20 seconds  Piriformis Stretch  Left;3 reps;10 seconds   Gr 2 mobs/AROm 20x then stattic strtech 2x 30    Piriformis Stretch Limitations  Used towel but this was hard on the hands/wrist      Knee/Hip Exercises: Aerobic   Nustep  L2 x 6 min with PTA present to monitor pain. Seat 9      Knee/Hip Exercises: Standing   Heel Raises  Both;1 set;15 reps    Hip Abduction  Stengthening;Left;1 set;15 reps;Knee straight      Knee/Hip Exercises: Seated   Clamshell with TheraBand  Red   2x10            PT Education - 04/01/20 0917    Education Details  Seated clamshell with red band for HEP    Person(s) Educated  Patient    Methods  Explanation;Tactile cues;Verbal cues;Handout    Comprehension  Returned demonstration;Verbalized understanding          PT Long Term Goals - 04/01/20 0850      PT LONG TERM GOAL #1   Title  Ind with HEP for strength and flexibility    Status  On-going      PT LONG TERM GOAL #2   Title  Patient able to stand and walk without pain in the left  knee.    Time  6    Period  Weeks    Status  Achieved   100%     PT LONG TERM GOAL #3   Title  Patient able to don/doff socks without difficulty    Time  6    Period  Weeks    Status  On-going   Moderate difficulty     PT LONG TERM GOAL #4   Title  Patient able to sleep without waking from knee pain    Time  6    Period  Weeks    Status  Achieved            Plan - 04/01/20 0911    Clinical Impression Statement  Pt presents painfree today. Pain is 100% decreased with ambulation and sleeping. Main complaint is donning/doffing socks. This activity is still moderately difficult. FOTO improved, see chart for score. Active knee flexion improved to 119 degrees today in supine.    Personal Factors and Comorbidities  Comorbidity 3+    Comorbidities  CVA 2007 (Rt side affected), depression, HTN, RA, leaky heart valve, DM, LBP, scoliosis, restless leg syndrome    Examination-Activity Limitations  Dressing;Sleep;Stand    Stability/Clinical Decision Making  Evolving/Moderate complexity    Rehab Potential  Excellent    PT Frequency  2x / week    PT Duration  6 weeks    PT Treatment/Interventions  ADLs/Self Care Home Management;Cryotherapy;Electrical Stimulation;Iontophoresis 4mg /ml Dexamethasone;Moist Heat;Ultrasound;Therapeutic exercise;Neuromuscular re-education;Patient/family education;Manual techniques;Dry needling;Taping;Vasopneumatic Device;Joint Manipulations    PT Next Visit Plan  review clam; porgress strength as tolerated;    PT Home Exercise Plan  [email or web address]    Consulted and Agree with Plan of Care  Patient       Patient will benefit from skilled therapeutic intervention in order to improve the following deficits and impairments:  Abnormal gait, Increased muscle spasms, Decreased range of motion, Increased edema, Pain, Impaired flexibility, Decreased strength  Visit Diagnosis: Acute pain of left knee  Stiffness of left knee, not elsewhere classified  Localized  edema     Problem List Patient Active Problem List   Diagnosis Date Noted  . Depression, major, single episode, mild ([place]) 09/29/2019  .  Polyarthralgia 04/21/2019  . Diabetes mellitus without complication ([place]) 123XX123  . CVA (cerebral vascular accident) ([place]) 01/25/2019  . RLS (restless legs syndrome) 01/25/2019  . Glaucoma, left eye 01/25/2019  . Glaucoma associated with ocular inflammation, left, 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  . Nonrheumatic aortic valve insufficiency   . CAD in native artery   . HTN (hypertension) 10/22/2013  . Hyperlipidemia 10/22/2013    Myrene Galas, PTA 04/01/20 9:29 AM  [facility] [address], [place], [place], [address] Phone: [phone]   Fax:  [phone]  Name: Cheryl Wood MRN: [id number] Date of Birth: Sep 21, 1936  Access Code: [email or web address]: [email or web address]: 04/05/2021Prepared by: Anderson Malta CochranExercises  Supine Heel Slide - 1 x daily - 7 x weekly - 10 reps - 2 sets  Supine ITB Stretch with Strap - 2 x daily - 7 x weekly - 3 reps - 1 sets - 20 sec hold  Supine Hamstring Stretch with Strap - 2 x daily - 7 x weekly - 3 reps - 1 sets - 20 sec hold  Supine Piriformis Stretch with Leg Straight - 2 x daily - 7 x weekly - 3 reps - 1 sets - 20 sec hold  Supine Knee Extension Strengthening - 1 x daily - 7 x weekly - 10 reps - 1-3 sets  Supine Straight Leg Raises - 1 x daily - 7 x weekly - 10 reps - 1-3 sets  Seated Hamstring Stretch - 1 x daily - 7 x weekly - 3 reps - 1 sets - 30 sec hold  Supine Figure 4 Piriformis Stretch - 1 x daily - 7 x weekly - 3 reps - 1 sets - 30 sec hold  Seated Hip Abduction - 1 x daily - 7 x weekly - 3 sets - 10 reps Patient Education  Trigger Point Dry Needling

## 2020-04-03 ENCOUNTER — Encounter: Payer: Self-pay | Admitting: Physical Therapy

## 2020-04-03 ENCOUNTER — Other Ambulatory Visit: Payer: Self-pay

## 2020-04-03 ENCOUNTER — Ambulatory Visit: Payer: Medicare HMO | Admitting: Physical Therapy

## 2020-04-03 DIAGNOSIS — R600 Localized edema: Secondary | ICD-10-CM | POA: Diagnosis not present

## 2020-04-03 DIAGNOSIS — M25562 Pain in left knee: Secondary | ICD-10-CM

## 2020-04-03 DIAGNOSIS — M25662 Stiffness of left knee, not elsewhere classified: Secondary | ICD-10-CM | POA: Diagnosis not present

## 2020-04-03 NOTE — Therapy (Signed)
[facility] [address], [place], [place], [address] Phone: [phone]   Fax:  [phone]  Physical Therapy Treatment  Patient Details  Name: Cheryl Wood MRN: [id number] Date of Birth: 1936/02/22 Referring Provider (PT): Orma Flaming MD   Encounter Date: 04/03/2020  PT End of Session - 04/03/20 0839    Visit Number  11    Date for PT Re-Evaluation  04/09/20    PT Start Time  0830    PT Stop Time  0910    PT Time Calculation (min)  40 min    Activity Tolerance  Patient tolerated treatment well    Behavior During Therapy  [facility] for tasks assessed/performed       Past Medical History:  Diagnosis Date  . Allergy   . Arthritis   . Bleeding of blood vessel    ext. genitalia area  . Blood in stool   . Chronic kidney disease   . Depression   . Glaucoma   . Heart murmur   . History of chicken pox   . History of recurrent UTIs   . History of stomach ulcers   . Hyperlipidemia   . Hypertension   . Memory loss   . Rheumatic fever   . Seizures ([place])   . Stroke ([place])   . TIA (transient ischemic attack) 10/22/2013  . Urine incontinence     Past Surgical History:  Procedure Laterality Date  . ABDOMINAL HYSTERECTOMY    . APPENDECTOMY  1983  . CATARACT EXTRACTION Right May 13, 2014  . EYE SURGERY  12/13/2018   stent inserted in left eye  . gum transplant    . RIGHT/LEFT HEART CATH AND CORONARY ANGIOGRAPHY N/A 05/12/2018   Procedure: RIGHT/LEFT HEART CATH AND CORONARY ANGIOGRAPHY;  Surgeon: Belva Crome, MD;  Location: [place] [facility];  Service: Cardiovascular;  Laterality: N/A;  . THORACIC AORTOGRAM N/A 05/12/2018   Procedure: THORACIC AORTOGRAM;  Surgeon: Belva Crome, MD;  Location: [place] [facility];  Service: Cardiovascular;  Laterality: N/A;  . TONSILLECTOMY      There were no vitals filed for this visit.  Subjective Assessment - 04/03/20 0841    Subjective  No pain this AM.    Pertinent  History  CVA 2007 (Rt side affected), depression, HTN, RA, leaky heart valve, DM, LBP, scoliosis, restless leg syndrome    Currently in Pain?  No/denies    Multiple Pain Sites  No                       [facility] Adult PT Treatment/Exercise - 04/03/20 0001      Knee/Hip Exercises: Stretches   Active Hamstring Stretch  Left;3 reps;20 seconds    Piriformis Stretch  Left;2 reps;20 seconds    Piriformis Stretch Limitations  Seated to simulate donning socks    Gastroc Stretch  Left;3 reps;10 seconds    Gastroc Stretch Limitations  On slant board    Other Knee/Hip Stretches  Lax ball on the wall to roll LT hip/gluteal fascia.       Knee/Hip Exercises: Aerobic   Nustep  L2 x 8 min with PTA present to discuss progress/current status      Knee/Hip Exercises: Standing   Heel Raises  Both;2 sets;10 reps    Hip Flexion  Stengthening;Left;1 set;10 reps;Knee bent    Hip Flexion Limitations  red band added     Hip Abduction  Stengthening;Left;2 sets;10 reps;Knee straight  Abduction Limitations  red band added for resistance    Hip Extension  AROM;Stengthening;Left;1 set;10 reps    Extension Limitations  red band      Knee/Hip Exercises: Seated   Long Arc Quad  AROM;Strengthening;Left;1 set;10 reps   With ball squeeze   Ball Squeeze  10x 3 sec hold    Clamshell with TheraBand  Red   3x10                 PT Long Term Goals - 04/01/20 0850      PT LONG TERM GOAL #1   Title  Ind with HEP for strength and flexibility    Status  On-going      PT LONG TERM GOAL #2   Title  Patient able to stand and walk without pain in the left knee.    Time  6    Period  Weeks    Status  Achieved   100%     PT LONG TERM GOAL #3   Title  Patient able to don/doff socks without difficulty    Time  6    Period  Weeks    Status  On-going   Moderate difficulty     PT LONG TERM GOAL #4   Title  Patient able to sleep without waking from knee pain    Time  6    Period  Weeks     Status  Achieved            Plan - 04/03/20 0840    Clinical Impression Statement  Pt presents today with no pain. Focus today was on LE strength and endurance which pt tolerated very well. PTA added resistance to her standing hip exercises with hopes of adding those exercises to her HEP next session. PTA educated pt how to use a tenis ball onthe wall to move her hip & gluteal tissues better. Pt will look into getting a ball for home use. No pain during treatment.    Personal Factors and Comorbidities  Comorbidity 3+    Comorbidities  CVA 2007 (Rt side affected), depression, HTN, RA, leaky heart valve, DM, LBP, scoliosis, restless leg syndrome    Examination-Activity Limitations  Dressing;Sleep;Stand    Stability/Clinical Decision Making  Evolving/Moderate complexity    Rehab Potential  Excellent    PT Frequency  2x / week    PT Duration  6 weeks    PT Treatment/Interventions  ADLs/Self Care Home Management;Cryotherapy;Electrical Stimulation;Iontophoresis 4mg /ml Dexamethasone;Moist Heat;Ultrasound;Therapeutic exercise;Neuromuscular re-education;Patient/family education;Manual techniques;Dry needling;Taping;Vasopneumatic Device;Joint Manipulations    PT Next Visit Plan  Add red band to hip standing 3 ways and LAQ to HEP    PT Home Exercise Plan  [email or web address]    Consulted and Agree with Plan of Care  Patient       Patient will benefit from skilled therapeutic intervention in order to improve the following deficits and impairments:  Abnormal gait, Increased muscle spasms, Decreased range of motion, Increased edema, Pain, Impaired flexibility, Decreased strength  Visit Diagnosis: Acute pain of left knee  Stiffness of left knee, not elsewhere classified     Problem List Patient Active Problem List   Diagnosis Date Noted  . Depression, major, single episode, mild ([place]) 09/29/2019  . Polyarthralgia 04/21/2019  . Diabetes mellitus without complication ([place]) 123XX123  . CVA (cerebral  vascular accident) ([place]) 01/25/2019  . RLS (restless legs syndrome) 01/25/2019  . Glaucoma, left eye 01/25/2019  . Glaucoma associated with ocular inflammation, left, 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  .  Nonrheumatic aortic valve insufficiency   . CAD in native artery   . HTN (hypertension) 10/22/2013  . Hyperlipidemia 10/22/2013    Cheryl Wood,PTA 04/03/2020, 9:13 AM  [facility] [address] [place], [place], [address] Phone: [phone]   Fax:  [phone]  Name: Cheryl Wood MRN: [id number] Date of Birth: 04/29/1936

## 2020-04-08 ENCOUNTER — Other Ambulatory Visit: Payer: Self-pay

## 2020-04-08 ENCOUNTER — Encounter: Payer: Self-pay | Admitting: Physical Therapy

## 2020-04-08 ENCOUNTER — Ambulatory Visit: Payer: Medicare HMO | Admitting: Physical Therapy

## 2020-04-08 DIAGNOSIS — R600 Localized edema: Secondary | ICD-10-CM

## 2020-04-08 DIAGNOSIS — M25562 Pain in left knee: Secondary | ICD-10-CM | POA: Diagnosis not present

## 2020-04-08 DIAGNOSIS — M25662 Stiffness of left knee, not elsewhere classified: Secondary | ICD-10-CM

## 2020-04-08 NOTE — Therapy (Signed)
[name] [address], [place], [place], [address] Phone: [phone]   Fax:  [phone]  Physical Therapy Treatment  Patient Details  Name: Cheryl Wood MRN: [id number] Date of Birth: December 05, 1936 Referring Provider (PT): Orma Flaming MD   Encounter Date: 04/08/2020  PT End of Session - 04/08/20 0933    Visit Number  12    Date for PT Re-Evaluation  04/09/20    Progress Note Due on Visit  20    PT Start Time  0928    PT Stop Time  1007    PT Time Calculation (min)  39 min    Activity Tolerance  Patient tolerated treatment well    Behavior During Therapy  [facility] for tasks assessed/performed       Past Medical History:  Diagnosis Date  . Allergy   . Arthritis   . Bleeding of blood vessel    ext. genitalia area  . Blood in stool   . Chronic kidney disease   . Depression   . Glaucoma   . Heart murmur   . History of chicken pox   . History of recurrent UTIs   . History of stomach ulcers   . Hyperlipidemia   . Hypertension   . Memory loss   . Rheumatic fever   . Seizures ([place])   . Stroke ([place])   . TIA (transient ischemic attack) 10/22/2013  . Urine incontinence     Past Surgical History:  Procedure Laterality Date  . ABDOMINAL HYSTERECTOMY    . APPENDECTOMY  1983  . CATARACT EXTRACTION Right May 13, 2014  . EYE SURGERY  12/13/2018   stent inserted in left eye  . gum transplant    . RIGHT/LEFT HEART CATH AND CORONARY ANGIOGRAPHY N/A 05/12/2018   Procedure: RIGHT/LEFT HEART CATH AND CORONARY ANGIOGRAPHY;  Surgeon: Belva Crome, MD;  Location: [place] [facility];  Service: Cardiovascular;  Laterality: N/A;  . THORACIC AORTOGRAM N/A 05/12/2018   Procedure: THORACIC AORTOGRAM;  Surgeon: Belva Crome, MD;  Location: [place] [facility];  Service: Cardiovascular;  Laterality: N/A;  . TONSILLECTOMY      There were no vitals filed for this visit.  Subjective Assessment - 04/08/20 0931    Subjective   My pain is a 1/10, mainly hurts when I walk after sitting for a while.    Pertinent History  CVA 2007 (Rt side affected), depression, HTN, RA, leaky heart valve, DM, LBP, scoliosis, restless leg syndrome    Currently in Pain?  Yes    Pain Score  1     Pain Location  Knee    Pain Orientation  Left;Posterior    Pain Descriptors / Indicators  Dull    Aggravating Factors   bending the knee    Pain Relieving Factors  stretching, needling    Multiple Pain Sites  No                       [facility] Adult PT Treatment/Exercise - 04/08/20 0001      Knee/Hip Exercises: Stretches   Active Hamstring Stretch  Left;3 reps;20 seconds    Piriformis Stretch  Both;2 reps;20 seconds    Gastroc Stretch  Left;3 reps;20 seconds    Gastroc Stretch Limitations  On slant board    Other Knee/Hip Stretches  Lax ball on the wall to roll LT hip/gluteal fascia.    pt's friend is going to give her a tennis ball  for home use     Knee/Hip Exercises: Aerobic   Nustep  L2 x 8 min with PTA present to discuss progress/current status      Knee/Hip Exercises: Standing   Knee Flexion  AROM;Left;2 sets;10 reps      Knee/Hip Exercises: Seated   Long Arc Quad  Strengthening;Left;2 sets;10 reps;Weights    Long Arc Quad Weight  2 lbs.    Clamshell with TheraBand  Green   2x10. issued for HEP advancement                 PT Long Term Goals - 04/01/20 0850      PT LONG TERM GOAL #1   Title  Ind with HEP for strength and flexibility    Status  On-going      PT LONG TERM GOAL #2   Title  Patient able to stand and walk without pain in the left knee.    Time  6    Period  Weeks    Status  Achieved   100%     PT LONG TERM GOAL #3   Title  Patient able to don/doff socks without difficulty    Time  6    Period  Weeks    Status  On-going   Moderate difficulty     PT LONG TERM GOAL #4   Title  Patient able to sleep without waking from knee pain    Time  6    Period  Weeks    Status  Achieved             Plan - 04/08/20 0944    Clinical Impression Statement  Since evaluation pt reports she feels almost 100% improved. getting socks on is improving. Pt continues to be compliant with HEP. Pt is getting a tennis ball from a friend to continue doingsoft tissue work for her hips at home.    Personal Factors and Comorbidities  Comorbidity 3+    Comorbidities  CVA 2007 (Rt side affected), depression, HTN, RA, leaky heart valve, DM, LBP, scoliosis, restless leg syndrome    Examination-Activity Limitations  Dressing;Sleep;Stand    Stability/Clinical Decision Making  Evolving/Moderate complexity    Rehab Potential  Excellent    PT Frequency  2x / week    PT Duration  6 weeks    PT Treatment/Interventions  ADLs/Self Care Home Management;Cryotherapy;Electrical Stimulation;Iontophoresis 4mg /ml Dexamethasone;Moist Heat;Ultrasound;Therapeutic exercise;Neuromuscular re-education;Patient/family education;Manual techniques;Dry needling;Taping;Vasopneumatic Device;Joint Manipulations    PT Next Visit Plan  Pt interested in discharge next session.    PT Home Exercise Plan  [email or web address]    Consulted and Agree with Plan of Care  Patient       Patient will benefit from skilled therapeutic intervention in order to improve the following deficits and impairments:  Abnormal gait, Increased muscle spasms, Decreased range of motion, Increased edema, Pain, Impaired flexibility, Decreased strength  Visit Diagnosis: Acute pain of left knee  Stiffness of left knee, not elsewhere classified  Localized edema     Problem List Patient Active Problem List   Diagnosis Date Noted  . Depression, major, single episode, mild ([place]) 09/29/2019  . Polyarthralgia 04/21/2019  . Diabetes mellitus without complication ([place]) 123XX123  . CVA (cerebral vascular accident) ([place]) 01/25/2019  . RLS (restless legs syndrome) 01/25/2019  . Glaucoma, left eye 01/25/2019  . Glaucoma associated with ocular inflammation,  left, 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  . Nonrheumatic aortic valve insufficiency   . CAD in native artery   . HTN (hypertension) 10/22/2013  .  Hyperlipidemia 10/22/2013    [name], PTA 04/08/2020, 10:06 AM  [facility] [address], [place], [place], [address] Phone: [phone]   Fax:  [phone]  Name: YOUSTINA SUPPLEE MRN: [id number] Date of Birth: [birth year]

## 2020-04-10 DIAGNOSIS — H26492 Other secondary cataract, left eye: Secondary | ICD-10-CM | POA: Diagnosis not present

## 2020-04-11 ENCOUNTER — Ambulatory Visit: Payer: Medicare HMO | Admitting: Physical Therapy

## 2020-04-11 ENCOUNTER — Encounter: Payer: Self-pay | Admitting: Physical Therapy

## 2020-04-11 ENCOUNTER — Other Ambulatory Visit: Payer: Self-pay

## 2020-04-11 DIAGNOSIS — M25662 Stiffness of left knee, not elsewhere classified: Secondary | ICD-10-CM

## 2020-04-11 DIAGNOSIS — M25562 Pain in left knee: Secondary | ICD-10-CM

## 2020-04-11 DIAGNOSIS — R600 Localized edema: Secondary | ICD-10-CM

## 2020-04-11 NOTE — Therapy (Signed)
[facility] [address] [place], [place], [address] Phone: [phone]   Fax:  [phone]  Physical Therapy Treatment  Patient Details  Name: JONEISHA MILES MRN: [id number] Date of Birth: 08/05/1936 Referring Provider (PT): Orma Flaming MD   Encounter Date: 04/11/2020  PT End of Session - 04/11/20 0851    Visit Number  13    Date for PT Re-Evaluation  04/11/20    PT Start Time  0847    PT Stop Time  0927    PT Time Calculation (min)  40 min    Activity Tolerance  Patient tolerated treatment well    Behavior During Therapy  [facility] for tasks assessed/performed       Past Medical History:  Diagnosis Date  . Allergy   . Arthritis   . Bleeding of blood vessel    ext. genitalia area  . Blood in stool   . Chronic kidney disease   . Depression   . Glaucoma   . Heart murmur   . History of chicken pox   . History of recurrent UTIs   . History of stomach ulcers   . Hyperlipidemia   . Hypertension   . Memory loss   . Rheumatic fever   . Seizures ([place])   . Stroke ([place])   . TIA (transient ischemic attack) 10/22/2013  . Urine incontinence     Past Surgical History:  Procedure Laterality Date  . ABDOMINAL HYSTERECTOMY    . APPENDECTOMY  1983  . CATARACT EXTRACTION Right May 13, 2014  . EYE SURGERY  12/13/2018   stent inserted in left eye  . gum transplant    . RIGHT/LEFT HEART CATH AND CORONARY ANGIOGRAPHY N/A 05/12/2018   Procedure: RIGHT/LEFT HEART CATH AND CORONARY ANGIOGRAPHY;  Surgeon: Belva Crome, MD;  Location: [place] [facility];  Service: Cardiovascular;  Laterality: N/A;  . THORACIC AORTOGRAM N/A 05/12/2018   Procedure: THORACIC AORTOGRAM;  Surgeon: Belva Crome, MD;  Location: [place] [facility];  Service: Cardiovascular;  Laterality: N/A;  . TONSILLECTOMY      There were no vitals filed for this visit.  Subjective Assessment - 04/11/20 0851    Subjective  Just when I get in the car is when  it bothers me the most, but denies pain currently    Pertinent History  CVA 2007 (Rt side affected), depression, HTN, RA, leaky heart valve, DM, LBP, scoliosis, restless leg syndrome    Patient Stated Goals  to be able to stand, walk and put her socks on without pain,    Currently in Pain?  No/denies         [facility] PT Assessment - 04/11/20 0001      Assessment   Medical Diagnosis  acute left knee pain    Referring Provider (PT)  Orma Flaming MD      Observation/Other Assessments   Focus on Therapeutic Outcomes (FOTO)   43% limited - goal met      AROM   Left Knee Extension  0    Left Knee Flexion  112      Strength   Overall Strength Comments  knee flexion and ext 5/5                   [facility] Adult PT Treatment/Exercise - 04/11/20 0001      Knee/Hip Exercises: Aerobic   Nustep  L2 x 8 min with PT present to discuss progress/current status  Knee/Hip Exercises: Standing   Hip Abduction  Stengthening;Left;10 reps;Knee straight;1 set    Abduction Limitations  2 lb    Hip Extension  Stengthening;1 set;10 reps;Both    Extension Limitations  2lb      Knee/Hip Exercises: Seated   Long Arc Quad  Strengthening;Left;2 sets;10 reps;Weights    Long Arc Quad Weight  2 lbs.      Knee/Hip Exercises: Supine   Bridges with Clamshell  Strengthening;Both;20 reps   green band   Other Supine Knee/Hip Exercises  knee and hip flexion roll on red ball 20x                  PT Long Term Goals - 04/11/20 7371      PT LONG TERM GOAL #1   Title  Ind with HEP for strength and flexibility    Status  Achieved      PT LONG TERM GOAL #2   Title  Patient able to stand and walk without pain in the left knee.    Status  Achieved      PT LONG TERM GOAL #3   Title  Patient able to don/doff socks without difficulty    Baseline  2/10    Status  Partially Met      PT LONG TERM GOAL #4   Title  Patient able to sleep without waking from knee pain    Status  Achieved       PT LONG TERM GOAL #5   Title  Patient to demo left knee flexion of 0-125 to normalize gait.    Baseline  2-112    Status  Not Met      PT LONG TERM GOAL #6   Title  Pt to demo 5/5 left knee strength to normalize ADLs    Status  Achieved      PT LONG TERM GOAL #7   Title  Improved FOTO to 48% limitation    Baseline  43%    Status  Achieved            Plan - 04/11/20 0903    Clinical Impression Statement  Pt to have one more follow up session today to ensure successful transition to HEP.  She has met most of her goals and doing well with exercises provided in PT    PT Treatment/Interventions  ADLs/Self Care Home Management;Cryotherapy;Electrical Stimulation;Iontophoresis 29m/ml Dexamethasone;Moist Heat;Ultrasound;Therapeutic exercise;Neuromuscular re-education;Patient/family education;Manual techniques;Dry needling;Taping;Vasopneumatic Device;Joint Manipulations    PT Next Visit Plan  d/c todya    PT Home Exercise Plan  [id number]    Consulted and Agree with Plan of Care  Patient       Patient will benefit from skilled therapeutic intervention in order to improve the following deficits and impairments:  Abnormal gait, Increased muscle spasms, Decreased range of motion, Increased edema, Pain, Impaired flexibility, Decreased strength  Visit Diagnosis: Acute pain of left knee - Plan: PT plan of care cert/re-cert  Stiffness of left knee, not elsewhere classified - Plan: PT plan of care cert/re-cert  Localized edema - Plan: PT plan of care cert/re-cert     Problem List Patient Active Problem List   Diagnosis Date Noted  . Depression, major, single episode, mild ([place] 09/29/2019  . Polyarthralgia 04/21/2019  . Diabetes mellitus without complication ([place] 006/26/9485 . CVA (cerebral vascular accident) ([facility] 01/25/2019  . RLS (restless legs syndrome) 01/25/2019  . Glaucoma, left eye 01/25/2019  . Glaucoma associated with ocular inflammation, left, 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   .  Nonrheumatic aortic valve insufficiency   . CAD in native artery   . HTN (hypertension) 10/22/2013  . Hyperlipidemia 10/22/2013    Jule Ser, PT 04/11/2020, 9:28 AM  [facility] [address] [place], [place], [address] Phone: [phone]   Fax:  [phone]  Name: DEADRA DIGGINS MRN: [id number] Date of Birth: 04/07/1936  PHYSICAL THERAPY DISCHARGE SUMMARY  Visits from Start of Care: 13  Current functional level related to goals / functional outcomes: See above goals met   Remaining deficits: See above   Education / Equipment: HEP  Plan: Patient agrees to discharge.  Patient goals were met. Patient is being discharged due to being pleased with the current functional level.  ?????    [employer], PT 04/11/20 9:29 AM

## 2020-04-18 DIAGNOSIS — H4042X2 Glaucoma secondary to eye inflammation, left eye, moderate stage: Secondary | ICD-10-CM | POA: Diagnosis not present

## 2020-04-19 ENCOUNTER — Other Ambulatory Visit: Payer: Self-pay | Admitting: Family Medicine

## 2020-05-15 DIAGNOSIS — Z79899 Other long term (current) drug therapy: Secondary | ICD-10-CM | POA: Diagnosis not present

## 2020-05-15 DIAGNOSIS — M0600 Rheumatoid arthritis without rheumatoid factor, unspecified site: Secondary | ICD-10-CM | POA: Diagnosis not present

## 2020-06-13 DIAGNOSIS — L578 Other skin changes due to chronic exposure to nonionizing radiation: Secondary | ICD-10-CM | POA: Diagnosis not present

## 2020-06-13 DIAGNOSIS — H01005 Unspecified blepharitis left lower eyelid: Secondary | ICD-10-CM | POA: Diagnosis not present

## 2020-06-13 DIAGNOSIS — Z85828 Personal history of other malignant neoplasm of skin: Secondary | ICD-10-CM | POA: Diagnosis not present

## 2020-06-13 DIAGNOSIS — Z859 Personal history of malignant neoplasm, unspecified: Secondary | ICD-10-CM | POA: Diagnosis not present

## 2020-06-13 DIAGNOSIS — L570 Actinic keratosis: Secondary | ICD-10-CM | POA: Diagnosis not present

## 2020-06-13 DIAGNOSIS — I872 Venous insufficiency (chronic) (peripheral): Secondary | ICD-10-CM | POA: Diagnosis not present

## 2020-06-13 DIAGNOSIS — H01002 Unspecified blepharitis right lower eyelid: Secondary | ICD-10-CM | POA: Diagnosis not present

## 2020-06-13 DIAGNOSIS — I87391 Chronic venous hypertension (idiopathic) with other complications of right lower extremity: Secondary | ICD-10-CM | POA: Diagnosis not present

## 2020-06-13 DIAGNOSIS — L821 Other seborrheic keratosis: Secondary | ICD-10-CM | POA: Diagnosis not present

## 2020-06-13 DIAGNOSIS — D1801 Hemangioma of skin and subcutaneous tissue: Secondary | ICD-10-CM | POA: Diagnosis not present

## 2020-06-14 DIAGNOSIS — Z961 Presence of intraocular lens: Secondary | ICD-10-CM | POA: Diagnosis not present

## 2020-06-14 DIAGNOSIS — H35033 Hypertensive retinopathy, bilateral: Secondary | ICD-10-CM | POA: Diagnosis not present

## 2020-06-14 DIAGNOSIS — H4042X2 Glaucoma secondary to eye inflammation, left eye, moderate stage: Secondary | ICD-10-CM | POA: Diagnosis not present

## 2020-06-14 DIAGNOSIS — H209 Unspecified iridocyclitis: Secondary | ICD-10-CM | POA: Diagnosis not present

## 2020-06-25 DIAGNOSIS — Z0100 Encounter for examination of eyes and vision without abnormal findings: Secondary | ICD-10-CM | POA: Diagnosis not present

## 2020-06-28 DIAGNOSIS — H5213 Myopia, bilateral: Secondary | ICD-10-CM | POA: Diagnosis not present

## 2020-06-28 DIAGNOSIS — H524 Presbyopia: Secondary | ICD-10-CM | POA: Diagnosis not present

## 2020-07-03 ENCOUNTER — Encounter: Payer: Self-pay | Admitting: Family Medicine

## 2020-07-03 ENCOUNTER — Other Ambulatory Visit: Payer: Self-pay

## 2020-07-03 ENCOUNTER — Ambulatory Visit (INDEPENDENT_AMBULATORY_CARE_PROVIDER_SITE_OTHER): Payer: Medicare HMO | Admitting: Family Medicine

## 2020-07-03 VITALS — BP 116/68 | HR 77 | Temp 96.7°F | Ht 62.0 in | Wt 174.0 lb

## 2020-07-03 DIAGNOSIS — F320 Major depressive disorder, single episode, mild: Secondary | ICD-10-CM

## 2020-07-03 DIAGNOSIS — R252 Cramp and spasm: Secondary | ICD-10-CM | POA: Diagnosis not present

## 2020-07-03 DIAGNOSIS — R42 Dizziness and giddiness: Secondary | ICD-10-CM

## 2020-07-03 DIAGNOSIS — E119 Type 2 diabetes mellitus without complications: Secondary | ICD-10-CM

## 2020-07-03 DIAGNOSIS — E782 Mixed hyperlipidemia: Secondary | ICD-10-CM | POA: Diagnosis not present

## 2020-07-03 DIAGNOSIS — R600 Localized edema: Secondary | ICD-10-CM

## 2020-07-03 DIAGNOSIS — I10 Essential (primary) hypertension: Secondary | ICD-10-CM | POA: Diagnosis not present

## 2020-07-03 DIAGNOSIS — R69 Illness, unspecified: Secondary | ICD-10-CM | POA: Diagnosis not present

## 2020-07-03 LAB — COMPREHENSIVE METABOLIC PANEL
ALT: 16 U/L (ref 0–35)
AST: 16 U/L (ref 0–37)
Albumin: 4.5 g/dL (ref 3.5–5.2)
Alkaline Phosphatase: 63 U/L (ref 39–117)
BUN: 35 mg/dL — ABNORMAL HIGH (ref 6–23)
CO2: 31 mEq/L (ref 19–32)
Calcium: 9.7 mg/dL (ref 8.4–10.5)
Chloride: 104 mEq/L (ref 96–112)
Creatinine, Ser: 1.2 mg/dL (ref 0.40–1.20)
GFR: 42.99 mL/min — ABNORMAL LOW (ref >60.00)
Glucose, Bld: 137 mg/dL — ABNORMAL HIGH (ref 70–99)
Potassium: 4.2 mEq/L (ref 3.5–5.1)
Sodium: 143 mEq/L (ref 135–145)
Total Bilirubin: 0.9 mg/dL (ref 0.2–1.2)
Total Protein: 6.8 g/dL (ref 6.0–8.3)

## 2020-07-03 LAB — HEMOGLOBIN A1C: Hgb A1c MFr Bld: 7.3 % — ABNORMAL HIGH (ref 4.6–6.5)

## 2020-07-03 LAB — MICROALBUMIN / CREATININE URINE RATIO
Creatinine,U: 95 mg/dL
Microalb Creat Ratio: 0.7 mg/g (ref 0.0–30.0)
Microalb, Ur: <0.7 mg/dL (ref 0.0–1.9)

## 2020-07-03 LAB — CBC WITH DIFFERENTIAL/PLATELET
Basophils Absolute: 0 10*3/uL (ref 0.0–0.1)
Basophils Relative: 0.3 % (ref 0.0–3.0)
Eosinophils Absolute: 0.2 10*3/uL (ref 0.0–0.7)
Eosinophils Relative: 2.9 % (ref 0.0–5.0)
HCT: 39.9 % (ref 36.0–46.0)
Hemoglobin: 13.3 g/dL (ref 12.0–15.0)
Lymphocytes Relative: 36.8 % (ref 12.0–46.0)
Lymphs Abs: 2.4 10*3/uL (ref 0.7–4.0)
MCHC: 33.5 g/dL (ref 30.0–36.0)
MCV: 93.1 fl (ref 78.0–100.0)
Monocytes Absolute: 0.7 10*3/uL (ref 0.1–1.0)
Monocytes Relative: 11 % (ref 3.0–12.0)
Neutro Abs: 3.1 10*3/uL (ref 1.4–7.7)
Neutrophils Relative %: 49 % (ref 43.0–77.0)
Platelets: 183 10*3/uL (ref 150.0–400.0)
RBC: 4.28 Mil/uL (ref 3.87–5.11)
RDW: 13.3 % (ref 11.5–15.5)
WBC: 6.4 10*3/uL (ref 4.0–10.5)

## 2020-07-03 LAB — LIPID PANEL
Cholesterol: 205 mg/dL — ABNORMAL HIGH (ref 0–200)
HDL: 49.5 mg/dL (ref >39.00)
LDL Cholesterol: 132 mg/dL — ABNORMAL HIGH (ref 0–99)
NonHDL: 155.41
Total CHOL/HDL Ratio: 4
Triglycerides: 118 mg/dL (ref 0.0–149.0)
VLDL: 23.6 mg/dL (ref 0.0–40.0)

## 2020-07-03 LAB — MAGNESIUM: Magnesium: 2.1 mg/dL (ref 1.5–2.5)

## 2020-07-03 LAB — CK: Total CK: 92 U/L (ref 7–177)

## 2020-07-03 MED ORDER — PRAMIPEXOLE DIHYDROCHLORIDE 0.25 MG PO TABS: 0.2500 mg | ORAL_TABLET | Freq: Every day | ORAL | 1 refills | Status: DC

## 2020-07-03 NOTE — Patient Instructions (Signed)
1) checking all labs today.   2) for light headedness I want you to see what you are doing when this is happening e.g. turning head, getting up from sitting or lying, have you eaten? Etc... then maybe we can pinpoint causes. Also take blood pressure during these episodes to make sure not dropping.   3) for leg cramps, make sure you stretch out your cavles really good before bed. Will check some labs as well, but lasix could be cause since on such a high dosage of this.   See you back in 3 months for f/u of the above!   Happy summer! Dr. Rogers Blocker

## 2020-07-03 NOTE — Progress Notes (Signed)
Patient: Cheryl Wood MRN: [id number] DOB: December 04, 1936 PCP: Orma Flaming, MD     Subjective:  Chief Complaint  Patient presents with   Diabetes   Hyperlipidemia   Hypertension   light headedness   leg cramping    HPI: The patient is a 84 y.o. female who presents today for Diabetes, HTN, hyperlipidemia and depression follow up. She complains of bad legs cramps and wants to know if she can take an OTC magnesium. She also complains of lightheadedness starting this week.   Hypertension: Here for follow up of hypertension.  Currently on lasix '80mg'$  BID and cozaar '100mg'$  daily. Takes medication as prescribed and denies any side effects. Exercise includes none. Weight has been stable. Denies any chest pain, headaches, shortness of breath, vision changes, swelling in lower extremities.   Diabetes:  Patient is here for follow up of type 2 diabetes.  Currently on the following medications none. She has really tried to change her diet. Last A1C was 7.2. Currently exercising and following diabetic diet.  Denies any hypoglycemic events. Denies any vision changes, nausea, vomiting, abdominal pain, ulcers in feet, polyuria, polydipsia or polyphagia. Denies any chest pain, shortness of breath. She does have paraesthesias  In her feet.   Hyperlipidemia Currently on mevacor '20mg'$ /day. She is fasting today for labs. History of CAD and CVA. On plavix and statin. Denies any chest pain, shortness of breath.   Depression Currently on zoloft '25mg'$ /day. She is very happy with the medication and does not want to stop this. States her depression is very well controlled. It has also helped that she can go out and do more now as well.   Leg cramping Has had this for a long time. Mainly at night and they wake her up at nights. She takes an over the counter medication that really helps. She also wants to know if she can take magnesium. She does drink plenty of water, but does not stretch before bed.   Light  headed Started this week. She is not dizzy. It is intermittent in nature. Lasts for a few seconds only. Happens only with movement. Unsure if positional changes or head movement.   Has had her covid vaccines.   Review of Systems  Constitutional: Negative for chills, fatigue and fever.  HENT: Negative for dental problem, ear pain, hearing loss and trouble swallowing.   Eyes: Negative for visual disturbance.  Respiratory: Negative for cough, chest tightness and shortness of breath.   Cardiovascular: Negative for chest pain, palpitations and leg swelling.  Gastrointestinal: Negative for abdominal pain, blood in stool, diarrhea, nausea and vomiting.  Endocrine: Negative for cold intolerance, polydipsia, polyphagia and polyuria.  Genitourinary: Negative for dysuria, frequency, hematuria, pelvic pain and urgency.  Musculoskeletal: Negative for arthralgias.  Skin: Negative for rash.  Neurological: Positive for light-headedness. Negative for dizziness and headaches.  Psychiatric/Behavioral: Negative for dysphoric mood and sleep disturbance. The patient is not nervous/anxious.     Allergies Patient is allergic to tetanus toxoids and hctz [hydrochlorothiazide].  Past Medical History Patient  has a past medical history of Allergy, Arthritis, Bleeding of blood vessel, Blood in stool, Chronic kidney disease, Depression, Glaucoma, Heart murmur, History of chicken pox, History of recurrent UTIs, History of stomach ulcers, Hyperlipidemia, Hypertension, Memory loss, Rheumatic fever, Seizures ([place]), Stroke ([place]), TIA (transient ischemic attack) (10/22/2013), and Urine incontinence.  Surgical History Patient  has a past surgical history that includes Abdominal hysterectomy; gum transplant; Cataract extraction (Right, May 13, 2014); RIGHT/LEFT HEART CATH AND CORONARY ANGIOGRAPHY (  N/A, 05/12/2018); THORACIC AORTOGRAM (N/A, 05/12/2018); Eye surgery (12/13/2018); Appendectomy (1983); and Tonsillectomy.  Family  History Pateint's family history includes Alcohol abuse in her maternal grandfather, paternal grandfather, and sister; Arthritis in her brother, father, paternal grandmother, and sister; COPD in her sister; Cancer in her mother and sister; Diabetes in her brother and daughter; Drug abuse in her sister; Early death in her brother, brother, father, maternal grandfather, mother, and sister; Hearing loss in her father; Heart attack in her brother; Heart disease in her brother, daughter, father, paternal grandfather, and sister; Hyperlipidemia in her brother, daughter, father, maternal grandmother, paternal grandfather, and sister; Hypertension in her brother, father, maternal grandmother, paternal grandfather, and sister; Stroke in her maternal grandmother and paternal grandfather.  Social History Patient  reports that she quit smoking about 48 years ago. Her smoking use included cigarettes. She quit after 40.00 years of use. She has never used smokeless tobacco. She reports that she does not drink alcohol and does not use drugs.    Objective: Vitals:   07/03/20 0825  BP: 116/68  Pulse: 77  Temp: (!) 96.7 F (35.9 C)  TempSrc: Temporal  SpO2: 93%  Weight: 174 lb (78.9 kg)  Height: 5' 2"  (1.575 m)    Body mass index is 31.83 kg/m.  Physical Exam Vitals reviewed.  Constitutional:      Appearance: Normal appearance. She is well-developed. She is obese.  HENT:     Head: Normocephalic and atraumatic.     Right Ear: Tympanic membrane, ear canal and external ear normal.     Left Ear: Tympanic membrane, ear canal and external ear normal.     Ears:     Comments: bilateral HA, TM wnl.     Mouth/Throat:     Mouth: Mucous membranes are moist.  Eyes:     Extraocular Movements: Extraocular movements intact.     Conjunctiva/sclera: Conjunctivae normal.     Pupils: Pupils are equal, round, and reactive to light.     Comments: Right eye with scleral injection and some clear drainage.   Neck:      Thyroid: No thyromegaly.     Vascular: No carotid bruit.  Cardiovascular:     Rate and Rhythm: Normal rate and regular rhythm.     Heart sounds: Murmur heard.   Pulmonary:     Effort: Pulmonary effort is normal.     Breath sounds: Normal breath sounds.  Abdominal:     General: Bowel sounds are normal. There is no distension.     Palpations: Abdomen is soft.     Tenderness: There is no abdominal tenderness.  Musculoskeletal:     Cervical back: Normal range of motion and neck supple.  Lymphadenopathy:     Cervical: No cervical adenopathy.  Skin:    General: Skin is warm and dry.     Capillary Refill: Capillary refill takes less than 2 seconds.     Findings: No rash.     Comments: 2 flesh colored lesions under right eyelid. Some erythema under eyelid as well.   Neurological:     General: No focal deficit present.     Mental Status: She is alert and oriented to person, place, and time.     Cranial Nerves: No cranial nerve deficit.     Coordination: Coordination normal.     Deep Tendon Reflexes: Reflexes normal.  Psychiatric:        Mood and Affect: Mood normal.        Behavior: Behavior normal.  Office Visit from 07/03/2020 in [place]  PHQ-9 Total Score 5          Assessment/plan: 1. Essential hypertension Blood pressure is to goal. Continue current anti-hypertensive medications per hpi. Refills not needed and routine lab work will be done today. Recommended routine exercise and healthy diet including DASH diet and mediterranean diet. Encouraged weight loss. F/u in 6 months.   - CBC with Differential/Platelet - Comprehensive metabolic panel  2. Diabetes mellitus without complication ([place]) -M1Y today -utd on immunizations, on ARB and statin.  -utd on eye exam.  F/u in 3-6 months depending on labs.  - Hemoglobin A1c - Microalbumin / creatinine urine ratio  3. Depression, major, single episode, mild ([facility]) phq9 score mild and very well  controlled. Continue current medication. Did discuss could be contributing to light headedness, but she has been on with no issues thus far. F/u in 6 months.   4. Mixed hyperlipidemia Fasting labs today.  -continue statin, encouraged exercise.  - Lipid panel  5. Leg cramping I do think this is partially due to her large dose of lasix; however, I have tried to decrease her lasix down and her leg swelling is very dramatic. I do think it's worth checking a leg ultrasound for venous reflux. Checking ck/mag. Already hydrating well. Recommended calf stretches daily/before bed and showed her these. Can continue with over the counter medication as needed since this works well for her.  - CK - Magnesium  6. Light headedness Large differential. Lasix/bp meds, orthostatics wnl and sugar normal. She is going to check her BP at home to make sure not too low and sugar as well. I gave her an accucheck kit and showed her how to use this. If gets worse she is to let me know. Will f/u on labs.   This visit occurred during the SARS-CoV-2 public health emergency.  Safety protocols were in place, including screening questions prior to the visit, additional usage of staff PPE, and extensive cleaning of exam room while observing appropriate contact time as indicated for disinfecting solutions.     Return in about 3 months (around 10/03/2020) for light headedness/leg cramping. can cancel if feeling fine and f/u in 6 months. Orma Flaming, MD [place]   07/03/2020

## 2020-07-10 ENCOUNTER — Encounter: Payer: Self-pay | Admitting: Family Medicine

## 2020-07-10 ENCOUNTER — Ambulatory Visit (INDEPENDENT_AMBULATORY_CARE_PROVIDER_SITE_OTHER): Payer: Medicare HMO | Admitting: Family Medicine

## 2020-07-10 VITALS — BP 150/80 | HR 56 | Temp 97.4°F | Ht 62.0 in | Wt 173.6 lb

## 2020-07-10 DIAGNOSIS — I809 Phlebitis and thrombophlebitis of unspecified site: Secondary | ICD-10-CM

## 2020-07-10 DIAGNOSIS — M0600 Rheumatoid arthritis without rheumatoid factor, unspecified site: Secondary | ICD-10-CM | POA: Diagnosis not present

## 2020-07-10 NOTE — Patient Instructions (Signed)
Use voltaren gel four times a day to area Elevate legs I would use ice initially and then can change to heat after 72 hours.  Let me know friday or Monday if not getting better or change in symptoms.      Phlebitis Phlebitis is soreness and swelling (inflammation) of a vein. Follow these instructions at home: Managing pain, stiffness, and swelling  If told, apply heat to the affected area. Do this as often as told by your doctor. Use the heat source that your doctor tells you to use. This may include a moist heat pack or a heating pad. ? Place a towel between your skin and the heat source. ? Leave the heat on for 20-30 minutes. ? Take off the heat if your skin turns bright red. This is very important if you cannot feel pain, heat, or cold. You may be more likely to get burned.  Raise (elevate) the affected area above the level of your heart while you are sitting or lying down. Medicines  Take over-the-counter and prescription medicines only as told by your doctor.  If you were prescribed an antibiotic medicine, take it as told by your doctor. Do not stop taking the antibiotic even if your condition gets better.  If you take medicines to thin your blood, carry a medical alert card or wear your medical alert jewelry. General instructions   If you have phlebitis in your legs: ? Do not stand or sit for a long time. ? Keep your legs moving. ? Get up and take short walks if you have to sit for a long time. ? Try to avoid bed rest that lasts for a long time. Regular sleep is not bed rest.  Wear compression stockings as told by your doctor. These stockings help: ? To reduce swelling in your legs. ? To prevent blood clots. ? To stop the condition from coming back.  Do not use any products that contain nicotine or tobacco, such as cigarettes and e-cigarettes. If you need help quitting, ask your doctor.  Keep all follow-up visits as told by your doctor. This is important. This may  include any follow-up blood tests. Contact a doctor if:  You have strange bruises.  You have bleeding problems.  Your symptoms do not get better.  Your symptoms get worse.  You are taking medicine to treat swelling (anti-inflammatory medicine) and you get belly (abdominal) pain. Get help right away if:  You have sudden chest pain.  You suddenly have trouble breathing.  You have a fever and your symptoms get worse.  You cough up blood.  You feel dizzy or you pass out.  You have very bad pain and swelling in the affected arm or leg. These symptoms may be an emergency. Do not wait to see if the symptoms will go away. Get medical help right away. Call your local emergency services (911 in the [place] Do not drive yourself to the hospital. Summary  Phlebitis is soreness and swelling (inflammation) of a vein.  Raise (elevate) the affected area above the level of your heart while you are sitting or lying down.  If told, apply heat to the affected area. Do this as often as told by your doctor. Use the heat source that your doctor tells you to use. This may include a moist heat pack or a heating pad.  Take over-the-counter and prescription medicines only as told by your doctor. This information is not intended to replace advice given to you by your  health care provider. Make sure you discuss any questions you have with your health care provider. Document Revised: 01/24/2019 Document Reviewed: 01/19/2017 Elsevier Patient Education  2020 [employer].

## 2020-07-10 NOTE — Progress Notes (Signed)
Patient: Cheryl Wood MRN: [id number] DOB: 1936-10-30 PCP: Orma Flaming, MD     Subjective:  Chief Complaint  Patient presents with  . knot on leg    HPI: The patient is a 84 y.o. female who presents today for a knot on her right leg. She first noticed it Thursday or Friday of last week. She recalls no trauma, tick bite, injury. It is not getting bigger. No pain with walking.   She says that it is tender to the touch. It does not hurt unless she puts pressure on it and it's sharp/stinging. She can not put her leg on it when sleeping. No redness, no lesion, feels swollen and slightly warm.    She was sent by the Rheumatologist after receiving an infusion.   Review of Systems  Constitutional: Negative for chills and fever.  Respiratory: Negative for cough, shortness of breath and wheezing.   Cardiovascular: Positive for leg swelling. Negative for chest pain and palpitations.  Musculoskeletal: Positive for joint swelling.    Allergies Patient is allergic to tetanus toxoids and hctz [hydrochlorothiazide].  Past Medical History Patient  has a past medical history of Allergy, Arthritis, Bleeding of blood vessel, Blood in stool, Chronic kidney disease, Depression, Glaucoma, Heart murmur, History of chicken pox, History of recurrent UTIs, History of stomach ulcers, Hyperlipidemia, Hypertension, Memory loss, Rheumatic fever, Seizures ([place]), Stroke ([place]), TIA (transient ischemic attack) (10/22/2013), and Urine incontinence.  Surgical History Patient  has a past surgical history that includes Abdominal hysterectomy; gum transplant; Cataract extraction (Right, May 13, 2014); RIGHT/LEFT HEART CATH AND CORONARY ANGIOGRAPHY (N/A, 05/12/2018); THORACIC AORTOGRAM (N/A, 05/12/2018); Eye surgery (12/13/2018); Appendectomy (1983); and Tonsillectomy.  Family History Pateint's family history includes Alcohol abuse in her maternal grandfather, paternal grandfather, and sister; Arthritis in her brother,  father, paternal grandmother, and sister; COPD in her sister; Cancer in her mother and sister; Diabetes in her brother and daughter; Drug abuse in her sister; Early death in her brother, brother, father, maternal grandfather, mother, and sister; Hearing loss in her father; Heart attack in her brother; Heart disease in her brother, daughter, father, paternal grandfather, and sister; Hyperlipidemia in her brother, daughter, father, maternal grandmother, paternal grandfather, and sister; Hypertension in her brother, father, maternal grandmother, paternal grandfather, and sister; Stroke in her maternal grandmother and paternal grandfather.  Social History Patient  reports that she quit smoking about 48 years ago. Her smoking use included cigarettes. She quit after 40.00 years of use. She has never used smokeless tobacco. She reports that she does not drink alcohol and does not use drugs.    Objective: Vitals:   07/10/20 1150  BP: (!) 150/80  Pulse: (!) 56  Temp: (!) 97.4 F (36.3 C)  TempSrc: Temporal  SpO2: 97%  Weight: 173 lb 9.6 oz (78.7 kg)  Height: 5\' 2"  (1.575 m)    Body mass index is 31.75 kg/m.  Physical Exam Vitals reviewed.  Constitutional:      Appearance: Normal appearance. She is obese.  HENT:     Head: Normocephalic and atraumatic.  Pulmonary:     Effort: Pulmonary effort is normal.  Musculoskeletal:     Right lower leg: Edema (at baseline ) present.     Left lower leg: Edema (at baseline ) present.  Skin:    Comments: Linear Indurated and edematous area along medial aspect of right lower leg. About 4-5cm in length and 2 cm in diameter. Indurated and slightly warm. No erythema, no wound/bite, no drainage. Flesh colored. Tender  to touch. Very superficial.   Neurological:     Mental Status: She is alert.        Assessment/plan: 1. Phlebitis Want her to try cool compresses and voltaren gel QID, leg elevation, compression hose. Discussed to watch for cellulitis, change  in symptoms, but no signs of infection. Keep a close eye on this and let me know if anything changes or worsens. If not getting better we can ultrasound.    This visit occurred during the SARS-CoV-2 public health emergency.  Safety protocols were in place, including screening questions prior to the visit, additional usage of staff PPE, and extensive cleaning of exam room while observing appropriate contact time as indicated for disinfecting solutions.     Return if symptoms worsen or fail to improve.     @AWME @ 07/10/2020

## 2020-07-16 ENCOUNTER — Other Ambulatory Visit: Payer: Self-pay | Admitting: Family Medicine

## 2020-07-22 ENCOUNTER — Other Ambulatory Visit: Payer: Self-pay

## 2020-07-22 ENCOUNTER — Telehealth: Payer: Self-pay | Admitting: Family Medicine

## 2020-07-22 ENCOUNTER — Ambulatory Visit (INDEPENDENT_AMBULATORY_CARE_PROVIDER_SITE_OTHER): Payer: Medicare HMO | Admitting: Physician Assistant

## 2020-07-22 ENCOUNTER — Ambulatory Visit (HOSPITAL_COMMUNITY)
Admission: RE | Admit: 2020-07-22 | Discharge: 2020-07-22 | Disposition: A | Discharge status: Home / Self Care | Payer: Medicare HMO | Source: Ambulatory Visit | Attending: Physician Assistant | Admitting: Physician Assistant

## 2020-07-22 ENCOUNTER — Telehealth: Payer: Self-pay

## 2020-07-22 ENCOUNTER — Encounter: Payer: Self-pay | Admitting: Physician Assistant

## 2020-07-22 VITALS — BP 118/76 | HR 58 | Temp 97.2°F | Ht 62.0 in | Wt 173.8 lb

## 2020-07-22 DIAGNOSIS — M79604 Pain in right leg: Secondary | ICD-10-CM | POA: Diagnosis not present

## 2020-07-22 NOTE — Patient Instructions (Addendum)
It was great to see you!  Please get your ultrasound as scheduled. I will be in touch with the plan after that has been completed.  I would like you to increase your plavix to 75 mg daily in the meantime.  Take care,  Inda Coke PA-C

## 2020-07-22 NOTE — Telephone Encounter (Signed)
Noted  

## 2020-07-22 NOTE — Telephone Encounter (Signed)
error 

## 2020-07-22 NOTE — Telephone Encounter (Signed)
Patient is scheduled with Aldona Bar today.   Nurse Assessment Nurse: Alveta Heimlich, RN, Rise Paganini Date/Time (Eastern Time): 07/22/2020 8:33:31 AM Confirm and document reason for call. If symptomatic, describe symptoms. ---Caller states she has a knot in her right lower inner leg. She has had this 3 weeks. She stopped in Dr's office and saw her then. She said if no better in 2 to 3 weeks, to call back and she would order ultrasound. It is warm to touch now and feels like a burn. There are 2 more knots that came up this past week. A little more pinkish in that area Has the patient had close contact with a person known or suspected to have the novel coronavirus illness OR traveled / lives in area with major community spread (including international travel) in the last 14 days from the onset of symptoms? * If Asymptomatic, screen for exposure and travel within the last 14 days. ---No Does the patient have any new or worsening symptoms? ---Yes Will a triage be completed? ---Yes Related visit to physician within the last 2 weeks? ---No Does the PT have any chronic conditions? (i.e. diabetes, asthma, this includes High risk factors for pregnancy, etc.) ---Yes List chronic conditions. ---diabetic Is this a behavioral health or substance abuse call? ---NoPLEASE NOTE: All timestamps contained within this report are represented as [place] Standard Time. CONFIDENTIALTY NOTICE: This fax transmission is intended only for the addressee. It contains information that is legally privileged, confidential or otherwise protected from use or disclosure. If you are not the intended recipient, you are strictly prohibited from reviewing, disclosing, copying using or disseminating any of this information or taking any action in reliance on or regarding this information. If you have received this fax in error, please notify us immediately by telephone so that we can arrange for its return to [place]. Phone: [phone], Toll-Free:  [phone], Fax: [phone] Page: 2 of 2 Call Id: [id number] Guidelines Guideline Title Affirmed Question Affirmed Notes Nurse Date/Time Eilene Ghazi Time) Leg Pain [1] Thigh, calf, or ankle swelling AND [2] bilateral AND [3] 1 side is more swollen Myer Haff 07/22/2020 8:36:55 AM Disp. Time Eilene Ghazi Time) Disposition Final User 07/22/2020 8:39:24 AM See HCP within 4 Hours (or PCP triage) Yes Alveta Heimlich, RN, Ali Lowe Disagree/Comply Comply Caller Understands Yes PreDisposition Call Doctor Care Advice Given Per Guideline * IF OFFICE WILL BE OPEN: You need to be seen within the next 3 or 4 hours. Call your doctor (or NP/PA) now or as soon as the office opens. SEE HCP WITHIN 4 HOURS (OR PCP TRIAGE): CARE ADVICE given per Leg Pain (Adult) guideline. Referrals Warm transfer to backline

## 2020-07-22 NOTE — Progress Notes (Signed)
Cheryl Wood is a 84 y.o. female here for a new problem.  I acted as a Education administrator for [employer], PA-C [employer], [place]  History of Present Illness:   Chief Complaint  Patient presents with  . lump on leg    HPI  Lump on leg Saw her PCP on 07/10/20 for possible phlebitis. Was given conservative care precautions including cool compresses, Voltaren gel application 4 times daily, leg elevation and compression hose.  Patient reports that since this time her symptoms have worsened.  She now has more sensitive pain to touch.  Denies any fever, malaise, numbness or tingling of leg or toes.  She does have what feels like a burning sensation to the area.  She does have history of stroke and TIA.  She takes Plavix 75 mg every other day. She reports to me that she does not know why she takes this medication every other day instead of daily.  Past Medical History:  Diagnosis Date  . Allergy   . Arthritis   . Bleeding of blood vessel    ext. genitalia area  . Blood in stool   . Chronic kidney disease   . Depression   . Glaucoma   . Heart murmur   . History of chicken pox   . History of recurrent UTIs   . History of stomach ulcers   . Hyperlipidemia   . Hypertension   . Memory loss   . Rheumatic fever   . Seizures ([place])   . Stroke ([place])   . TIA (transient ischemic attack) 10/22/2013  . Urine incontinence      Social History   Tobacco Use  . Smoking status: Former Smoker    Years: 40.00    Types: Cigarettes    Quit date: 11/05/1971    Years since quitting: 48.7  . Smokeless tobacco: Never Used  Vaping Use  . Vaping Use: Never used  Substance Use Topics  . Alcohol use: No    Alcohol/week: 0.0 standard drinks  . Drug use: No    Past Surgical History:  Procedure Laterality Date  . ABDOMINAL HYSTERECTOMY    . APPENDECTOMY  1983  . CATARACT EXTRACTION Right May 13, 2014  . EYE SURGERY  12/13/2018   stent inserted in left eye  . gum transplant    . RIGHT/LEFT HEART  CATH AND CORONARY ANGIOGRAPHY N/A 05/12/2018   Procedure: RIGHT/LEFT HEART CATH AND CORONARY ANGIOGRAPHY;  Surgeon: Belva Crome, MD;  Location: [facility];  Service: Cardiovascular;  Laterality: N/A;  . THORACIC AORTOGRAM N/A 05/12/2018   Procedure: THORACIC AORTOGRAM;  Surgeon: Belva Crome, MD;  Location: [facility];  Service: Cardiovascular;  Laterality: N/A;  . TONSILLECTOMY      Family History  Problem Relation Age of Onset  . Cancer Mother   . Early death Mother   . Heart disease Father   . Arthritis Father   . Early death Father   . Hearing loss Father   . Hypertension Father   . Hyperlipidemia Father   . Cancer Sister   . Arthritis Sister   . Arthritis Brother   . Diabetes Brother   . Heart attack Brother   . Heart disease Brother   . Hyperlipidemia Brother   . Hypertension Brother   . Diabetes Daughter   . Heart disease Daughter   . Hyperlipidemia Daughter   . Hyperlipidemia Maternal Grandmother   . Hypertension Maternal Grandmother   . Stroke Maternal Grandmother   .  Alcohol abuse Maternal Grandfather   . Early death Maternal Grandfather        tuberculosis  . Arthritis Paternal Grandmother   . Alcohol abuse Paternal Grandfather   . Heart disease Paternal Grandfather   . Hyperlipidemia Paternal Grandfather   . Hypertension Paternal Grandfather   . Stroke Paternal Grandfather   . Alcohol abuse Sister   . COPD Sister   . Drug abuse Sister   . Early death Sister   . Heart disease Sister   . Hypertension Sister   . Hyperlipidemia Sister   . Early death Brother   . Early death Brother     Allergies  Allergen Reactions  . Dorzolamide Hcl-Timolol Mal Other (See Comments)    Red eyes  . Tetanus Toxoids Swelling    Arm was twice the size it should be  . Hctz [Hydrochlorothiazide] Rash    Current Medications:   Current Outpatient Medications:  .  acetaminophen (TYLENOL) 650 MG CR tablet, Take 650 mg by mouth every 8 (eight) hours as  needed for pain., Disp: , Rfl:  .  clopidogrel (PLAVIX) 75 MG tablet, TAKE ONE TABLET BY MOUTH EVERY OTHER DAY, Disp: 45 tablet, Rfl: 3 .  famciclovir (FAMVIR) 500 MG tablet, Take 500 mg by mouth daily. , Disp: , Rfl:  .  famotidine (PEPCID) 20 MG tablet, TAKE ONE TABLET BY MOUTH TWICE A DAY, Disp: 180 tablet, Rfl: 2 .  furosemide (LASIX) 80 MG tablet, Take 2 tablets (160 mg total) by mouth daily., Disp: 180 tablet, Rfl: 2 .  inFLIXimab (REMICADE) 100 MG injection, Inject into the vein., Disp: , Rfl:  .  KLOR-CON M20 20 MEQ tablet, TAKE ONE TABLET BY MOUTH DAILY, Disp: 90 tablet, Rfl: 2 .  loratadine (CLARITIN) 10 MG tablet, Take 10 mg by mouth daily., Disp: , Rfl:  .  losartan (COZAAR) 100 MG tablet, Take 1 tablet (100 mg total) by mouth daily., Disp: 90 tablet, Rfl: 3 .  lovastatin (MEVACOR) 20 MG tablet, TAKE ONE TABLET BY MOUTH EVERY EVENING, Disp: 90 tablet, Rfl: 3 .  pramipexole (MIRAPEX) 0.25 MG tablet, Take 1 tablet (0.25 mg total) by mouth at bedtime., Disp: 90 tablet, Rfl: 1 .  sertraline (ZOLOFT) 25 MG tablet, Take 1 tablet (25 mg total) by mouth daily., Disp: 90 tablet, Rfl: 3   Review of Systems:   ROS  Negative unless otherwise specified per HPI.  Vitals:   Vitals:   07/22/20 1005  BP: 118/76  Pulse: 58  Temp: (!) 97.2 F (36.2 C)  TempSrc: Temporal  SpO2: 98%  Weight: 173 lb 12.8 oz (78.8 kg)  Height: 5\' 2"  (1.575 m)     Body mass index is 31.79 kg/m.  Physical Exam:   Physical Exam Vitals and nursing note reviewed.  Constitutional:      General: She is not in acute distress.    Appearance: She is well-developed. She is not ill-appearing or toxic-appearing.  Cardiovascular:     Rate and Rhythm: Normal rate and regular rhythm.     Pulses: Normal pulses.     Heart sounds: Normal heart sounds, S1 normal and S2 normal.  Pulmonary:     Effort: Pulmonary effort is normal.     Breath sounds: Normal breath sounds.  Skin:    General: Skin is warm and dry.        Neurological:     Mental Status: She is alert.     GCS: GCS eye subscore is 4. GCS verbal subscore  is 5. GCS motor subscore is 6.  Psychiatric:        Speech: Speech normal.        Behavior: Behavior normal. Behavior is cooperative.       Assessment and Plan:   Lennox was seen today for lump on leg.  Diagnoses and all orders for this visit:  Acute pain of right lower extremity -     VAS [place] LOWER EXTREMITY VENOUS (DVT); Future   Will obtain further imaging or work-up. No obvious signs of infection on exam today. Vascular ultrasound revealed no evidence of superficial or deep vein thrombosis. Given chronicity and worsening of symptoms, will proceed with CT of lower extremity for further evaluation. I have also asked patient to start taking Plavix 75 mg daily, she is currently taking this every other day.  . Reviewed expectations re: course of current medical issues. . Discussed self-management of symptoms. . Outlined signs and symptoms indicating need for more acute intervention. . Patient verbalized understanding and all questions were answered. . See orders for this visit as documented in the electronic medical record. . Patient received an After-Visit Summary.  CMA or LPN served as scribe during this visit. History, Physical, and Plan performed by medical provider. The above documentation has been reviewed and is accurate and complete.   Inda Coke, PA-C

## 2020-07-25 DIAGNOSIS — D2310 Other benign neoplasm of skin of unspecified eyelid, including canthus: Secondary | ICD-10-CM | POA: Diagnosis not present

## 2020-07-25 DIAGNOSIS — D23112 Other benign neoplasm of skin of right lower eyelid, including canthus: Secondary | ICD-10-CM | POA: Diagnosis not present

## 2020-07-25 DIAGNOSIS — Z79899 Other long term (current) drug therapy: Secondary | ICD-10-CM | POA: Diagnosis not present

## 2020-07-26 ENCOUNTER — Telehealth: Payer: Self-pay

## 2020-07-26 NOTE — Telephone Encounter (Signed)
Dr. Rogers Blocker --   Please see my most recent visit with patient.  We did an ultrasound of her lower extremity and it was negative for clot.  Due to her severity of pain we discussed ordering a CT scan for further evaluation of her symptoms.  This was denied by her insurance company.    Please advise what you would like to do next, we put the paper copy of all of this information into your folder.  Cheryl Wood

## 2020-07-26 NOTE — Telephone Encounter (Signed)
Pt need additional information in order to get her CT lower extremity scan approved. Please see additional information on [employer] healthcare sheet for reasons why CT was denied. A peer-to-peer request may also be requested regarding to the case.

## 2020-07-31 ENCOUNTER — Telehealth: Payer: Self-pay | Admitting: Family Medicine

## 2020-07-31 DIAGNOSIS — H209 Unspecified iridocyclitis: Secondary | ICD-10-CM | POA: Diagnosis not present

## 2020-07-31 DIAGNOSIS — M79604 Pain in right leg: Secondary | ICD-10-CM

## 2020-07-31 DIAGNOSIS — M25562 Pain in left knee: Secondary | ICD-10-CM | POA: Diagnosis not present

## 2020-07-31 DIAGNOSIS — M1990 Unspecified osteoarthritis, unspecified site: Secondary | ICD-10-CM | POA: Diagnosis not present

## 2020-07-31 DIAGNOSIS — E669 Obesity, unspecified: Secondary | ICD-10-CM | POA: Diagnosis not present

## 2020-07-31 DIAGNOSIS — G629 Polyneuropathy, unspecified: Secondary | ICD-10-CM | POA: Diagnosis not present

## 2020-07-31 DIAGNOSIS — M180 Bilateral primary osteoarthritis of first carpometacarpal joints: Secondary | ICD-10-CM | POA: Diagnosis not present

## 2020-07-31 DIAGNOSIS — M0600 Rheumatoid arthritis without rheumatoid factor, unspecified site: Secondary | ICD-10-CM | POA: Diagnosis not present

## 2020-07-31 DIAGNOSIS — M25432 Effusion, left wrist: Secondary | ICD-10-CM | POA: Diagnosis not present

## 2020-07-31 DIAGNOSIS — M5416 Radiculopathy, lumbar region: Secondary | ICD-10-CM | POA: Diagnosis not present

## 2020-07-31 DIAGNOSIS — Z6832 Body mass index (BMI) 32.0-32.9, adult: Secondary | ICD-10-CM | POA: Diagnosis not present

## 2020-07-31 NOTE — Telephone Encounter (Signed)
Lvm for pt to call the office back. 

## 2020-07-31 NOTE — Telephone Encounter (Signed)
Please let her know that her CT of her leg was denied and no Prior Josem Kaufmann was done by provider that she saw. They are requiring an xray. [facility] and get an xray and then follow up with me sometime this week?   I have ordered xrays for you. At this time we do not have xrays in our clinic. You will have to go to our [facility]. The address is [address]  xray is located in the basement.  Hours of operation are M-F 8:30am to 5:00pm.  Closed for lunch between 12:30 and 1:00pm.    Thanks, Dr. Rogers Blocker

## 2020-08-02 NOTE — Telephone Encounter (Signed)
2nd attempt to get in touch with pt to give message below. Lvm for pt to call the office.

## 2020-08-05 ENCOUNTER — Other Ambulatory Visit: Payer: Self-pay

## 2020-08-05 ENCOUNTER — Telehealth: Payer: Self-pay

## 2020-08-05 MED ORDER — CLOPIDOGREL BISULFATE 75 MG PO TABS: 75.0000 mg | ORAL_TABLET | ORAL | 3 refills | Status: DC

## 2020-08-05 NOTE — Telephone Encounter (Signed)
3rd attempt to reach patient l/m to call office.

## 2020-08-05 NOTE — Telephone Encounter (Signed)
..   LAST APPOINTMENT DATE: 07/31/2020   NEXT APPOINTMENT DATE:@10 /06/2020  MEDICATION:clopidogrel (PLAVIX) 75 MG tablet    PHARMACY: Kristopher Oppenheim at [facility]

## 2020-08-06 ENCOUNTER — Telehealth: Payer: Self-pay | Admitting: Family Medicine

## 2020-08-06 NOTE — Telephone Encounter (Signed)
Devon from [facility] called wanting to clarify prescription that was sent in. Medication is Plavix. States in notes it says to take once a day every day and then in script it says to take once every other day. Please advise. Phone #: [phone]. Please call after 2:30.

## 2020-08-06 NOTE — Telephone Encounter (Signed)
Set medication to pharmacy.

## 2020-08-07 ENCOUNTER — Telehealth: Payer: Self-pay

## 2020-08-07 NOTE — Telephone Encounter (Signed)
Pt called stating that the prescription sent in to [facility] for her Plavix that the instructions are incorrect. Pt states that she is supposed to be taking 75 mg everyday.The pharmacy will not fill it because they are confused. Pt is requesting the pharmacy be contacted asap, because patient is going out of town this morning for a week.

## 2020-08-07 NOTE — Telephone Encounter (Signed)
I have attempted to call pt on numerous occassions, she has not answered. Pt needs to speak directly with me.   Thank You

## 2020-08-07 NOTE — Telephone Encounter (Signed)
Called pt and LVM that Dr. Shelby Mattocks assistant needs to be speak with her directly.

## 2020-08-07 NOTE — Telephone Encounter (Signed)
I spoke with pharmacy [facility]) to verify instructions for medication refill. Refill confirmed.

## 2020-08-07 NOTE — Telephone Encounter (Signed)
Last attempt to contact patient. Lab results not given.

## 2020-08-30 DIAGNOSIS — D23112 Other benign neoplasm of skin of right lower eyelid, including canthus: Secondary | ICD-10-CM | POA: Diagnosis not present

## 2020-08-30 DIAGNOSIS — Z79899 Other long term (current) drug therapy: Secondary | ICD-10-CM | POA: Diagnosis not present

## 2020-08-30 DIAGNOSIS — D2310 Other benign neoplasm of skin of unspecified eyelid, including canthus: Secondary | ICD-10-CM | POA: Diagnosis not present

## 2020-09-04 DIAGNOSIS — Z79899 Other long term (current) drug therapy: Secondary | ICD-10-CM | POA: Diagnosis not present

## 2020-09-04 DIAGNOSIS — M0600 Rheumatoid arthritis without rheumatoid factor, unspecified site: Secondary | ICD-10-CM | POA: Diagnosis not present

## 2020-09-18 DIAGNOSIS — K136 Irritative hyperplasia of oral mucosa: Secondary | ICD-10-CM | POA: Diagnosis not present

## 2020-09-27 IMAGING — MG DIGITAL SCREENING BILAT W/ TOMO W/ CAD
6 of 10 series · 6 of 30 positions shown · non-contrast
Comparison: Previous exam(s).

CLINICAL DATA: Screening.

EXAM:
DIGITAL SCREENING BILATERAL MAMMOGRAM WITH TOMO AND CAD

[L CC synth-2D (1 of 2)]
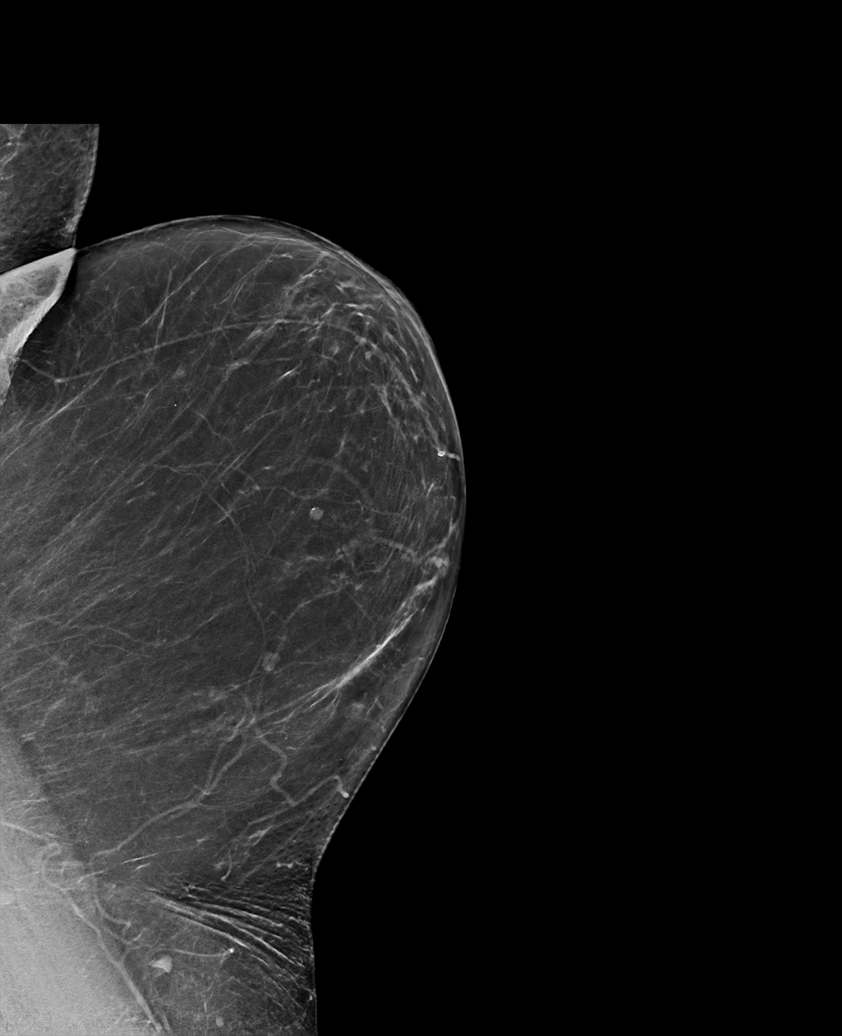

[R MLO synth-2D]
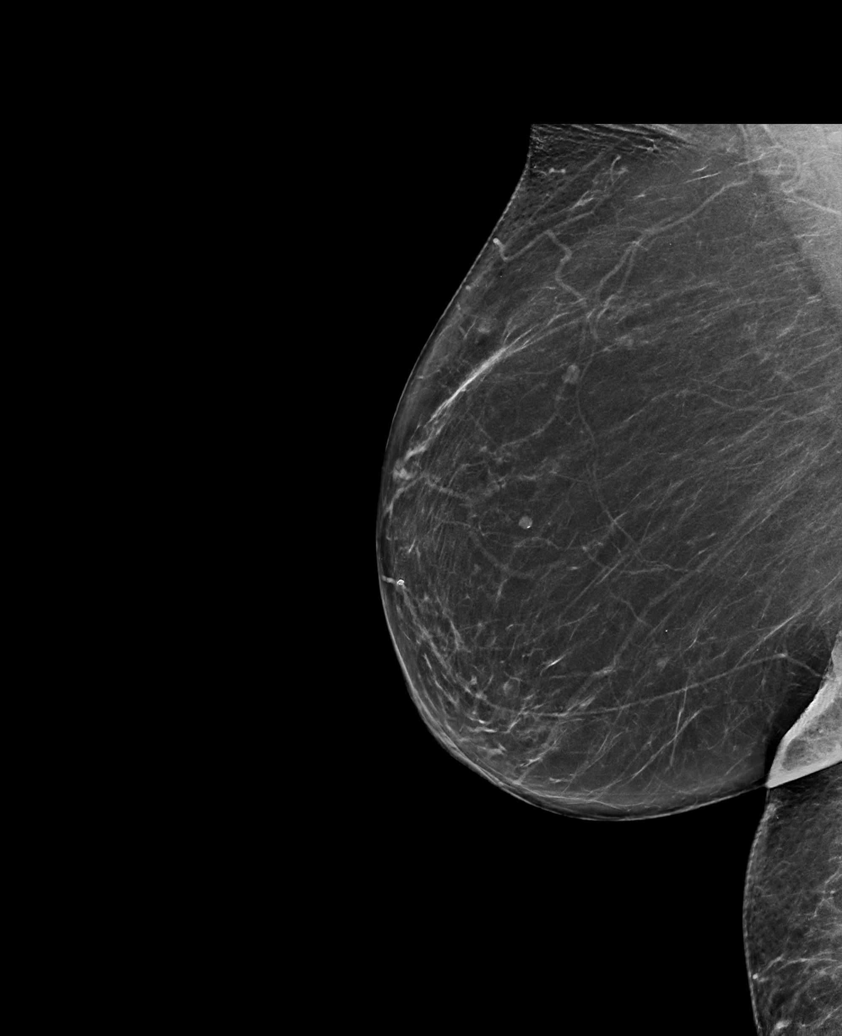

[L CC synth-2D (2 of 2)]
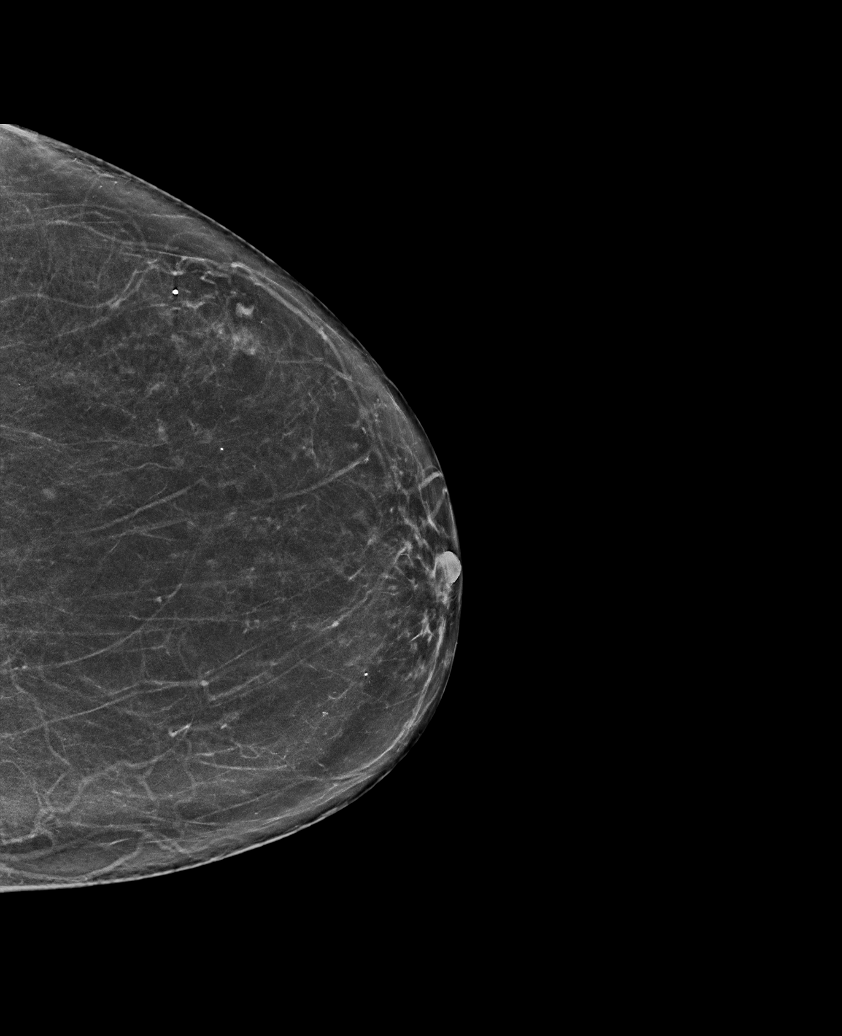

[L MLO synth-2D]
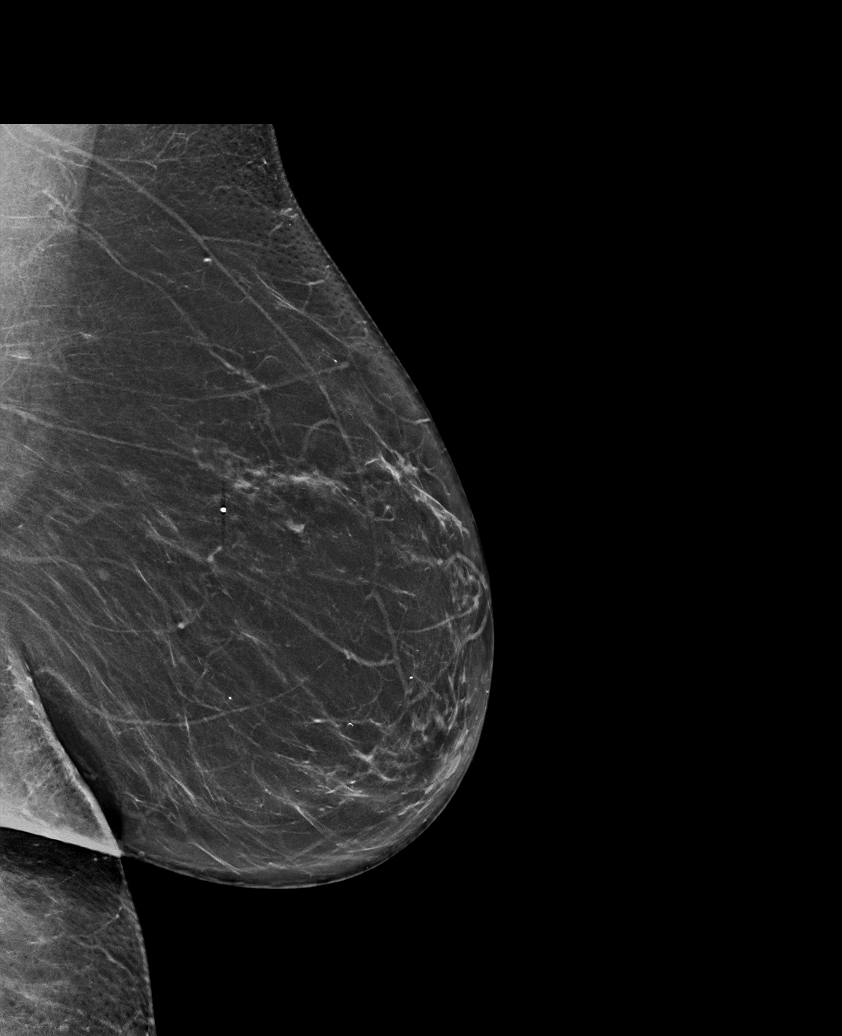

[R CC synth-2D]
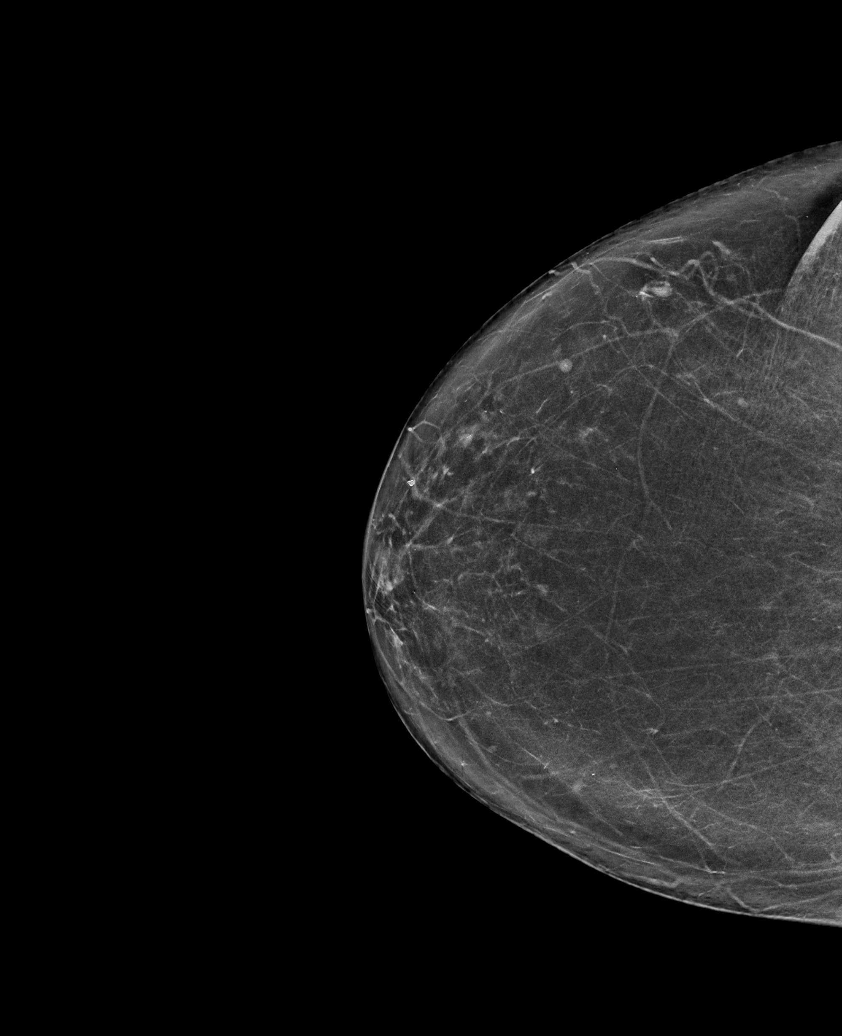

[R MLO tomo · tomo slice 36/71.0]
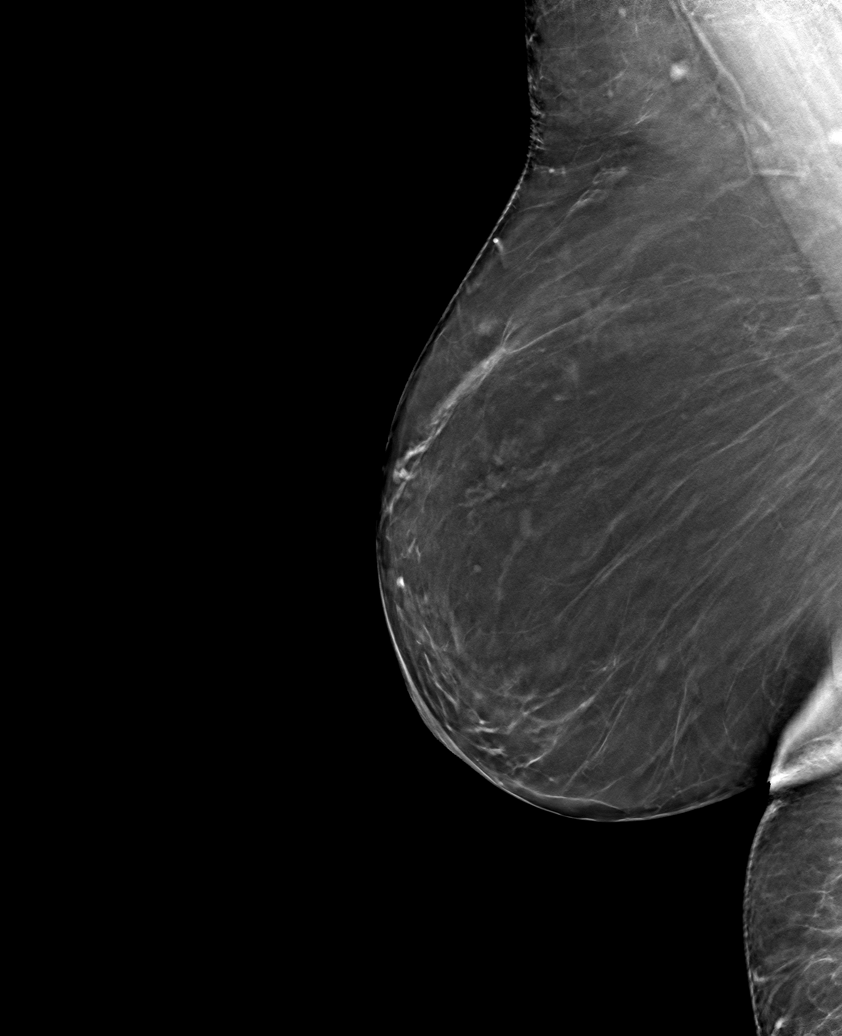

[6 of 30 positions shown; findings below may reference images not displayed]

ACR Breast Density Category b: There are scattered areas of
fibroglandular density.
FINDINGS: There are no findings suspicious for malignancy. Images were
processed with CAD.
IMPRESSION: No mammographic evidence of malignancy. A result letter of this
screening mammogram will be mailed directly to the patient.

RECOMMENDATION:
Screening mammogram in one year. (Code:[id number])

BI-RADS CATEGORY  1: Negative.

## 2020-10-03 ENCOUNTER — Other Ambulatory Visit: Payer: Self-pay | Admitting: Family Medicine

## 2020-10-03 ENCOUNTER — Encounter: Payer: Self-pay | Admitting: Family Medicine

## 2020-10-03 ENCOUNTER — Ambulatory Visit (INDEPENDENT_AMBULATORY_CARE_PROVIDER_SITE_OTHER): Payer: Medicare HMO | Admitting: Family Medicine

## 2020-10-03 ENCOUNTER — Other Ambulatory Visit: Payer: Self-pay

## 2020-10-03 VITALS — BP 142/85 | Temp 97.2°F | Ht 62.0 in | Wt 173.6 lb

## 2020-10-03 DIAGNOSIS — R2241 Localized swelling, mass and lump, right lower limb: Secondary | ICD-10-CM

## 2020-10-03 DIAGNOSIS — Z23 Encounter for immunization: Secondary | ICD-10-CM | POA: Diagnosis not present

## 2020-10-03 NOTE — Patient Instructions (Addendum)
-  ordering more imaging on your leg. I may have to argue for this, but we will get it done.  -labs in case if have to use contrast.    See you back in 3 months for regular appointment/labs.

## 2020-10-03 NOTE — Progress Notes (Signed)
Patient: Cheryl Wood MRN: [id number] DOB: October 30, 1936 PCP: Orma Flaming, MD     Subjective:  Chief Complaint  Patient presents with  . right lower leg mass    HPI: The patient is a 84 y.o. female who presents today for leg cramping follow up. She still has leg cramping in bilateral legs. She states it feels like there there is a lump on her right leg. She feels like the lump is getting bigger, but has shrunk. It is very tender to touch, no warmth or redness. She states nothing really helps it. Has tried voltaren gel and biofreeze. It doesn't hurt her unless she touches it.   Review of Systems  Constitutional: Negative for chills, fatigue and fever.  HENT: Negative for dental problem, ear pain, hearing loss and trouble swallowing.   Eyes: Negative for visual disturbance.  Respiratory: Negative for cough, chest tightness and shortness of breath.   Cardiovascular: Negative for chest pain, palpitations and leg swelling.  Gastrointestinal: Negative for abdominal pain, blood in stool, diarrhea and nausea.  Endocrine: Negative for cold intolerance, polydipsia, polyphagia and polyuria.  Genitourinary: Negative for dysuria and hematuria.  Musculoskeletal: Positive for myalgias. Negative for arthralgias.  Skin: Negative for rash.       Soft tissue mass on right medial leg   Neurological: Negative for dizziness and headaches.  Psychiatric/Behavioral: Negative for dysphoric mood and sleep disturbance. The patient is not nervous/anxious.     Allergies Patient is allergic to dorzolamide hcl-timolol mal, tetanus toxoids, and hctz [hydrochlorothiazide].  Past Medical History Patient  has a past medical history of Allergy, Arthritis, Bleeding of blood vessel, Blood in stool, Chronic kidney disease, Depression, Glaucoma, Heart murmur, History of chicken pox, History of recurrent UTIs, History of stomach ulcers, Hyperlipidemia, Hypertension, Memory loss, Rheumatic fever, Seizures ([place]), Stroke  ([place]), TIA (transient ischemic attack) (10/22/2013), and Urine incontinence.  Surgical History Patient  has a past surgical history that includes Abdominal hysterectomy; gum transplant; Cataract extraction (Right, May 13, 2014); RIGHT/LEFT HEART CATH AND CORONARY ANGIOGRAPHY (N/A, 05/12/2018); THORACIC AORTOGRAM (N/A, 05/12/2018); Eye surgery (12/13/2018); Appendectomy (1983); and Tonsillectomy.  Family History Pateint's family history includes Alcohol abuse in her maternal grandfather, paternal grandfather, and sister; Arthritis in her brother, father, paternal grandmother, and sister; COPD in her sister; Cancer in her mother and sister; Diabetes in her brother and daughter; Drug abuse in her sister; Early death in her brother, brother, father, maternal grandfather, mother, and sister; Hearing loss in her father; Heart attack in her brother; Heart disease in her brother, daughter, father, paternal grandfather, and sister; Hyperlipidemia in her brother, daughter, father, maternal grandmother, paternal grandfather, and sister; Hypertension in her brother, father, maternal grandmother, paternal grandfather, and sister; Stroke in her maternal grandmother and paternal grandfather.  Social History Patient  reports that she quit smoking about 48 years ago. Her smoking use included cigarettes. She quit after 40.00 years of use. She has never used smokeless tobacco. She reports that she does not drink alcohol and does not use drugs.    Objective: There were no vitals filed for this visit.  There is no height or weight on file to calculate BMI.  Physical Exam Vitals reviewed.  Constitutional:      Appearance: Normal appearance. She is obese.  HENT:     Head: Normocephalic and atraumatic.  Skin:    Capillary Refill: Capillary refill takes less than 2 seconds.     Findings: No erythema.     Comments: Right leg: 5-6cm very tender  indurated mass on lower right medial leg with 1cm mass just inferior to  this. No erythema.   Neurological:     General: No focal deficit present.     Mental Status: She is alert and oriented to person, place, and time.       Assessment/plan: 1. Lower leg mass, right MRI with/without contrast. Indurated and discussed I really am not sure. Will see what imaging shows [place].   2. High dose flu shot today.     This visit occurred during the SARS-CoV-2 public health emergency.  Safety protocols were in place, including screening questions prior to the visit, additional usage of staff PPE, and extensive cleaning of exam room while observing appropriate contact time as indicated for disinfecting solutions.     Return in about 3 months (around 01/03/2021) for 3 months for routine f/u. Marland Kitchen     Orma Flaming, MD [place]  10/03/2020

## 2020-10-04 DIAGNOSIS — H4042X2 Glaucoma secondary to eye inflammation, left eye, moderate stage: Secondary | ICD-10-CM | POA: Diagnosis not present

## 2020-10-04 LAB — COMPLETE METABOLIC PANEL WITH GFR
AG Ratio: 1.8 (calc) (ref 1.0–2.5)
ALT: 16 U/L (ref 6–29)
AST: 19 U/L (ref 10–35)
Albumin: 4.4 g/dL (ref 3.6–5.1)
Alkaline phosphatase (APISO): 58 U/L (ref 37–153)
BUN/Creatinine Ratio: 31 (calc) — ABNORMAL HIGH (ref 6–22)
BUN: 33 mg/dL — ABNORMAL HIGH (ref 7–25)
CO2: 27 mmol/L (ref 20–32)
Calcium: 9.7 mg/dL (ref 8.6–10.4)
Chloride: 105 mmol/L (ref 98–110)
Creat: 1.07 mg/dL — ABNORMAL HIGH (ref 0.60–0.88)
GFR, Est African American: 56 mL/min/{1.73_m2} — ABNORMAL LOW (ref >=60)
GFR, Est Non African American: 48 mL/min/{1.73_m2} — ABNORMAL LOW (ref >=60)
Globulin: 2.5 g/dL (calc) (ref 1.9–3.7)
Glucose, Bld: 129 mg/dL — ABNORMAL HIGH (ref 65–99)
Potassium: 4.1 mmol/L (ref 3.5–5.3)
Sodium: 141 mmol/L (ref 135–146)
Total Bilirubin: 1.1 mg/dL (ref 0.2–1.2)
Total Protein: 6.9 g/dL (ref 6.1–8.1)

## 2020-10-09 ENCOUNTER — Telehealth: Payer: Self-pay

## 2020-10-09 NOTE — Telephone Encounter (Signed)
Patient returned phone call regarding lab results

## 2020-10-09 NOTE — Telephone Encounter (Signed)
Please see below.  Orma Flaming, MD [place]

## 2020-10-09 NOTE — Telephone Encounter (Signed)
Spoke with pt to give imaging results message. Pt voiced understanding.

## 2020-10-17 ENCOUNTER — Other Ambulatory Visit: Payer: Self-pay | Admitting: Family Medicine

## 2020-10-23 ENCOUNTER — Other Ambulatory Visit: Payer: Self-pay | Admitting: Family Medicine

## 2020-10-23 DIAGNOSIS — R2241 Localized swelling, mass and lump, right lower limb: Secondary | ICD-10-CM

## 2020-10-23 NOTE — Progress Notes (Signed)
Called and spoke to Patients Daughter, Jefferey Pica on Patients DPR to ask if Pennye would still like to receive the CT scan. Karmen states that Sarit is on her way home from [place] and was believed to have already done the Xray. I informed her that it was not completed and was reordered today by Dr. Rogers Blocker. Karmen states that she will get in contact with [facility] and will contact us before the end of the day.

## 2020-10-23 NOTE — Progress Notes (Signed)
Please let her know she has to get xray done before we can get CT approval for her leg mass. Must go and do this. Orders are in for her. See below for address.   I have ordered xrays for you. At this time we do not have xrays in our clinic. You will have to go to our [facility] clinic. The address is [address]  xray is located in the basement.  Hours of operation are M-F 8:30am to 5:00pm.  Closed for lunch between 12:30 and 1:00pm.   Thanks! Dr. Rogers Blocker

## 2020-10-23 NOTE — Progress Notes (Signed)
Left message to call back  

## 2020-10-24 ENCOUNTER — Other Ambulatory Visit: Payer: Self-pay

## 2020-10-24 ENCOUNTER — Ambulatory Visit
Admission: RE | Admit: 2020-10-24 | Discharge: 2020-10-24 | Disposition: A | Discharge status: Home / Self Care | Payer: Medicare HMO | Source: Ambulatory Visit | Attending: Family Medicine | Admitting: Family Medicine

## 2020-10-24 DIAGNOSIS — I868 Varicose veins of other specified sites: Secondary | ICD-10-CM | POA: Diagnosis not present

## 2020-10-24 DIAGNOSIS — R2241 Localized swelling, mass and lump, right lower limb: Secondary | ICD-10-CM

## 2020-10-24 MED ORDER — GADOBENATE DIMEGLUMINE 529 MG/ML IV SOLN
15.0000 mL | Freq: Once | INTRAVENOUS | Status: AC | PRN
  Administered 2020-10-24: 15 mL via INTRAVENOUS

## 2020-10-28 DIAGNOSIS — H4042X2 Glaucoma secondary to eye inflammation, left eye, moderate stage: Secondary | ICD-10-CM | POA: Diagnosis not present

## 2020-10-28 NOTE — Progress Notes (Signed)
CT scan done, and spoke with pt to give results per Dr Shelby Mattocks message.

## 2020-10-30 DIAGNOSIS — Z79899 Other long term (current) drug therapy: Secondary | ICD-10-CM | POA: Diagnosis not present

## 2020-10-30 DIAGNOSIS — M0600 Rheumatoid arthritis without rheumatoid factor, unspecified site: Secondary | ICD-10-CM | POA: Diagnosis not present

## 2020-11-21 ENCOUNTER — Other Ambulatory Visit: Payer: Self-pay | Admitting: Family Medicine

## 2020-11-26 DIAGNOSIS — Z961 Presence of intraocular lens: Secondary | ICD-10-CM | POA: Diagnosis not present

## 2020-11-26 DIAGNOSIS — H209 Unspecified iridocyclitis: Secondary | ICD-10-CM | POA: Diagnosis not present

## 2020-11-26 DIAGNOSIS — H4042X2 Glaucoma secondary to eye inflammation, left eye, moderate stage: Secondary | ICD-10-CM | POA: Diagnosis not present

## 2020-11-26 DIAGNOSIS — H35033 Hypertensive retinopathy, bilateral: Secondary | ICD-10-CM | POA: Diagnosis not present

## 2020-12-24 DIAGNOSIS — H209 Unspecified iridocyclitis: Secondary | ICD-10-CM | POA: Diagnosis not present

## 2020-12-24 DIAGNOSIS — H35033 Hypertensive retinopathy, bilateral: Secondary | ICD-10-CM | POA: Diagnosis not present

## 2020-12-24 DIAGNOSIS — H4042X2 Glaucoma secondary to eye inflammation, left eye, moderate stage: Secondary | ICD-10-CM | POA: Diagnosis not present

## 2020-12-24 DIAGNOSIS — I10 Essential (primary) hypertension: Secondary | ICD-10-CM | POA: Diagnosis not present

## 2020-12-24 DIAGNOSIS — Z961 Presence of intraocular lens: Secondary | ICD-10-CM | POA: Diagnosis not present

## 2020-12-25 DIAGNOSIS — M0600 Rheumatoid arthritis without rheumatoid factor, unspecified site: Secondary | ICD-10-CM | POA: Diagnosis not present

## 2021-01-03 ENCOUNTER — Encounter: Payer: Self-pay | Admitting: Family Medicine

## 2021-01-03 ENCOUNTER — Other Ambulatory Visit: Payer: Self-pay

## 2021-01-03 ENCOUNTER — Other Ambulatory Visit: Payer: Self-pay | Admitting: Family Medicine

## 2021-01-03 ENCOUNTER — Ambulatory Visit (INDEPENDENT_AMBULATORY_CARE_PROVIDER_SITE_OTHER): Payer: Medicare HMO | Admitting: Family Medicine

## 2021-01-03 VITALS — BP 142/70 | HR 62 | Temp 97.3°F | Ht 62.0 in | Wt 171.4 lb

## 2021-01-03 DIAGNOSIS — I10 Essential (primary) hypertension: Secondary | ICD-10-CM | POA: Diagnosis not present

## 2021-01-03 DIAGNOSIS — M069 Rheumatoid arthritis, unspecified: Secondary | ICD-10-CM | POA: Insufficient documentation

## 2021-01-03 DIAGNOSIS — E119 Type 2 diabetes mellitus without complications: Secondary | ICD-10-CM | POA: Diagnosis not present

## 2021-01-03 DIAGNOSIS — M0600 Rheumatoid arthritis without rheumatoid factor, unspecified site: Secondary | ICD-10-CM | POA: Insufficient documentation

## 2021-01-03 DIAGNOSIS — R252 Cramp and spasm: Secondary | ICD-10-CM

## 2021-01-03 LAB — COMPREHENSIVE METABOLIC PANEL
ALT: 17 U/L (ref 0–35)
AST: 18 U/L (ref 0–37)
Albumin: 4.6 g/dL (ref 3.5–5.2)
Alkaline Phosphatase: 54 U/L (ref 39–117)
BUN: 33 mg/dL — ABNORMAL HIGH (ref 6–23)
CO2: 29 mEq/L (ref 19–32)
Calcium: 9.8 mg/dL (ref 8.4–10.5)
Chloride: 105 mEq/L (ref 96–112)
Creatinine, Ser: 1.21 mg/dL — ABNORMAL HIGH (ref 0.40–1.20)
GFR: 41.69 mL/min — ABNORMAL LOW (ref >60.00)
Glucose, Bld: 134 mg/dL — ABNORMAL HIGH (ref 70–99)
Potassium: 4 mEq/L (ref 3.5–5.1)
Sodium: 140 mEq/L (ref 135–145)
Total Bilirubin: 1 mg/dL (ref 0.2–1.2)
Total Protein: 7.3 g/dL (ref 6.0–8.3)

## 2021-01-03 LAB — CBC WITH DIFFERENTIAL/PLATELET
Basophils Absolute: 0 10*3/uL (ref 0.0–0.1)
Basophils Relative: 0.4 % (ref 0.0–3.0)
Eosinophils Absolute: 0.2 10*3/uL (ref 0.0–0.7)
Eosinophils Relative: 3.8 % (ref 0.0–5.0)
HCT: 39.5 % (ref 36.0–46.0)
Hemoglobin: 13.4 g/dL (ref 12.0–15.0)
Lymphocytes Relative: 39.6 % (ref 12.0–46.0)
Lymphs Abs: 2 10*3/uL (ref 0.7–4.0)
MCHC: 33.9 g/dL (ref 30.0–36.0)
MCV: 95.3 fl (ref 78.0–100.0)
Monocytes Absolute: 0.5 10*3/uL (ref 0.1–1.0)
Monocytes Relative: 10.5 % (ref 3.0–12.0)
Neutro Abs: 2.3 10*3/uL (ref 1.4–7.7)
Neutrophils Relative %: 45.7 % (ref 43.0–77.0)
Platelets: 188 10*3/uL (ref 150.0–400.0)
RBC: 4.14 Mil/uL (ref 3.87–5.11)
RDW: 13.4 % (ref 11.5–15.5)
WBC: 5.1 10*3/uL (ref 4.0–10.5)

## 2021-01-03 LAB — HEMOGLOBIN A1C: Hgb A1c MFr Bld: 6.9 % — ABNORMAL HIGH (ref 4.6–6.5)

## 2021-01-03 NOTE — Patient Instructions (Addendum)
Start a B complex vitamin for leg cramping. If we need a muscle relaxer we can add low dose, but I tend to stay away from these.   Routine labs for diabetes/blood pressure.   I think you are good for 6 months!   Happy new year! Be safe! Dr. Rogers Blocker

## 2021-01-03 NOTE — Progress Notes (Signed)
Patient: Cheryl Wood MRN: [id number] DOB: 1936/09/10 PCP: Orma Flaming, MD     Subjective:  Chief Complaint  Patient presents with  . leg cramping  . Hypertension  . Diabetes    HPI: The patient is a 85 y.o. female who presents today to follow up on leg cramping, diabetes and blood pressure. She says that she tries to get up and walk to stop the cramping. She is taking an OTC medication that relieves it in 15 minutes. She also complains of knot located on the lower right leg.  Diabetes: Patient is here for follow up of type 2 diabetes. First diagnosed 12/2018.  Currently on the following medications none, diet. Takes medications as prescribed. Last A1C was 7.3. Currently exercising and following diabetic diet.  Denies any hypoglycemic events. Denies any vision changes, nausea, vomiting, abdominal pain, ulcers/paraesthesia in feet, polyuria, polydipsia or polyphagia. Denies any chest pain, shortness of breath. She also thinks she has lost 5 pounds.   Hypertension: Here for follow up of hypertension.  Currently on cozaar 100mg /day. Takes medication as prescribed and denies any side effects. Exercise includes none. Weight has been stable. Denies any chest pain, headaches, shortness of breath, vision changes, swelling in lower extremities.   Leg cramping Lab work up has been negative. Advised water/b complex vitamin. Not started b complex. Does okay.   Questions about her brittle nails and if correlated with her RA.   Has had her covid booster.   Review of Systems  Constitutional: Negative for chills, fatigue and fever.  HENT: Negative for dental problem, ear pain, hearing loss and trouble swallowing.   Eyes: Negative for visual disturbance.  Respiratory: Negative for cough, chest tightness and shortness of breath.   Cardiovascular: Negative for chest pain, palpitations and leg swelling.  Gastrointestinal: Negative for abdominal pain, blood in stool, diarrhea and nausea.  Endocrine:  Negative for cold intolerance, polydipsia, polyphagia and polyuria.  Genitourinary: Negative for dysuria and hematuria.  Musculoskeletal: Negative for arthralgias, gait problem and joint swelling.  Skin: Negative for rash.  Neurological: Negative for dizziness, light-headedness and headaches.  Psychiatric/Behavioral: Negative for dysphoric mood and sleep disturbance. The patient is not nervous/anxious.     Allergies Patient is allergic to dorzolamide hcl-timolol mal, tetanus toxoids, and hctz [hydrochlorothiazide].  Past Medical History Patient  has a past medical history of Allergy, Arthritis, Bleeding of blood vessel, Blood in stool, Chronic kidney disease, Depression, Glaucoma, Heart murmur, History of chicken pox, History of recurrent UTIs, History of stomach ulcers, Hyperlipidemia, Hypertension, Memory loss, Rheumatic fever, Seizures ([place]), Stroke ([place]), TIA (transient ischemic attack) (10/22/2013), and Urine incontinence.  Surgical History Patient  has a past surgical history that includes Abdominal hysterectomy; gum transplant; Cataract extraction (Right, May 13, 2014); RIGHT/LEFT HEART CATH AND CORONARY ANGIOGRAPHY (N/A, 05/12/2018); THORACIC AORTOGRAM (N/A, 05/12/2018); Eye surgery (12/13/2018); Appendectomy (1983); and Tonsillectomy.  Family History Pateint's family history includes Alcohol abuse in her maternal grandfather, paternal grandfather, and sister; Arthritis in her brother, father, paternal grandmother, and sister; COPD in her sister; Cancer in her mother and sister; Diabetes in her brother and daughter; Drug abuse in her sister; Early death in her brother, brother, father, maternal grandfather, mother, and sister; Hearing loss in her father; Heart attack in her brother; Heart disease in her brother, daughter, father, paternal grandfather, and sister; Hyperlipidemia in her brother, daughter, father, maternal grandmother, paternal grandfather, and sister; Hypertension in her  brother, father, maternal grandmother, paternal grandfather, and sister; Stroke in her maternal grandmother and paternal  grandfather.  Social History Patient  reports that she quit smoking about 49 years ago. Her smoking use included cigarettes. She quit after 40.00 years of use. She has never used smokeless tobacco. She reports that she does not drink alcohol and does not use drugs.    Objective: Vitals:   01/03/21 0804  BP: (!) 142/70  Pulse: 62  Temp: (!) 97.3 F (36.3 C)  TempSrc: Temporal  SpO2: 98%  Weight: 171 lb 6.4 oz (77.7 kg)  Height: 5\' 2"  (1.575 m)    Body mass index is 31.35 kg/m.  Physical Exam Vitals reviewed.  Constitutional:      Appearance: Normal appearance. She is obese.  HENT:     Head: Normocephalic and atraumatic.     Ears:     Comments: Bilateral hearing aides  Cardiovascular:     Rate and Rhythm: Normal rate and regular rhythm.  Pulmonary:     Effort: Pulmonary effort is normal.     Breath sounds: Normal breath sounds.  Musculoskeletal:     Right lower leg: No edema.     Left lower leg: No edema.  Skin:    General: Skin is warm.     Comments: Right leg: superficial phelibits/varicositis. Tender to touch. No erythema   Neurological:     General: No focal deficit present.     Mental Status: She is alert and oriented to person, place, and time.  Psychiatric:        Mood and Affect: Mood normal.        Behavior: Behavior normal.         [facility] Office Visit from 01/03/2021 in [place] [facility]  PHQ-2 Total Score 0      Assessment/plan: 1. Diabetes mellitus without complication ([place]) Due for a1c. Has been decently controlled off medication for her age. Continue diabetic diet, exercise and weight loss/maintenance.  Foot exam next visit On ARB/statin and utd on vaccination.  - Hemoglobin A1c  2. Primary hypertension Nearly to goal. Has not taken her medication today. Continue cozaar 100mg /day. No refills needed.  Routine lab work. F/u in 6 months time.  - CBC with Differential/Platelet - Comprehensive metabolic panel  3. Leg cramping About the same. Adding on b complex vitamin. Continue with stretching/water.    Answered questions on her nails.   This visit occurred during the SARS-CoV-2 public health emergency.  Safety protocols were in place, including screening questions prior to the visit, additional usage of staff PPE, and extensive cleaning of exam room while observing appropriate contact time as indicated for disinfecting solutions.     Return in about 6 months (around 07/03/2021) for routine f/u htn/diabetes/depression .   Orma Flaming, MD [place]   01/03/2021

## 2021-01-09 ENCOUNTER — Other Ambulatory Visit: Payer: Self-pay | Admitting: Family Medicine

## 2021-01-29 DIAGNOSIS — H209 Unspecified iridocyclitis: Secondary | ICD-10-CM | POA: Diagnosis not present

## 2021-01-29 DIAGNOSIS — M0600 Rheumatoid arthritis without rheumatoid factor, unspecified site: Secondary | ICD-10-CM | POA: Diagnosis not present

## 2021-01-29 DIAGNOSIS — M5416 Radiculopathy, lumbar region: Secondary | ICD-10-CM | POA: Diagnosis not present

## 2021-01-29 DIAGNOSIS — E669 Obesity, unspecified: Secondary | ICD-10-CM | POA: Diagnosis not present

## 2021-01-29 DIAGNOSIS — M1990 Unspecified osteoarthritis, unspecified site: Secondary | ICD-10-CM | POA: Diagnosis not present

## 2021-01-29 DIAGNOSIS — M180 Bilateral primary osteoarthritis of first carpometacarpal joints: Secondary | ICD-10-CM | POA: Diagnosis not present

## 2021-01-29 DIAGNOSIS — M25432 Effusion, left wrist: Secondary | ICD-10-CM | POA: Diagnosis not present

## 2021-01-29 DIAGNOSIS — Z6832 Body mass index (BMI) 32.0-32.9, adult: Secondary | ICD-10-CM | POA: Diagnosis not present

## 2021-01-29 DIAGNOSIS — G629 Polyneuropathy, unspecified: Secondary | ICD-10-CM | POA: Diagnosis not present

## 2021-01-29 DIAGNOSIS — M25562 Pain in left knee: Secondary | ICD-10-CM | POA: Diagnosis not present

## 2021-02-07 ENCOUNTER — Telehealth: Payer: Self-pay

## 2021-02-07 NOTE — Telephone Encounter (Signed)
Scheduled pt with Dr. Rogers Blocker on 2/18

## 2021-02-07 NOTE — Telephone Encounter (Signed)
Patient is requesting a referral to a kidney specialist. Patient is experiencing incontinence and would like a referral to a facility who can help her with this. Please advise Thank you !

## 2021-02-07 NOTE — Telephone Encounter (Signed)
Okay to send referral. 

## 2021-02-07 NOTE — Telephone Encounter (Signed)
Lvm for pt to call the office back. Please schedule appointment for pt.   Thank you!

## 2021-02-07 NOTE — Telephone Encounter (Signed)
She does not need to see a nephrologist for urinary incontinence, typically I see them first and figure out which incontinence they have and then go from there. We can do meds for some and some other things. I would come see me first.  Orma Flaming, MD [place]

## 2021-02-10 ENCOUNTER — Other Ambulatory Visit: Payer: Self-pay | Admitting: Family Medicine

## 2021-02-14 ENCOUNTER — Ambulatory Visit (INDEPENDENT_AMBULATORY_CARE_PROVIDER_SITE_OTHER): Payer: Medicare HMO | Admitting: Family Medicine

## 2021-02-14 ENCOUNTER — Encounter: Payer: Self-pay | Admitting: Family Medicine

## 2021-02-14 ENCOUNTER — Other Ambulatory Visit: Payer: Self-pay

## 2021-02-14 VITALS — BP 138/80 | HR 90 | Temp 97.7°F | Ht 62.0 in | Wt 174.0 lb

## 2021-02-14 DIAGNOSIS — R32 Unspecified urinary incontinence: Secondary | ICD-10-CM

## 2021-02-14 LAB — POCT URINALYSIS DIPSTICK
Bilirubin, UA: NEGATIVE
Blood, UA: NEGATIVE
Glucose, UA: NEGATIVE
Ketones, UA: NEGATIVE
Nitrite, UA: NEGATIVE
Protein, UA: NEGATIVE
Spec Grav, UA: 1.025 (ref 1.010–1.025)
Urobilinogen, UA: 0.2 E.U./dL
pH, UA: 5 (ref 5.0–8.0)

## 2021-02-14 NOTE — Progress Notes (Signed)
Patient: Cheryl Wood MRN: [id number] DOB: 02/25/1936 PCP: Orma Flaming, MD     Subjective:  Chief Complaint  Patient presents with  . Urinary Incontinence    Symptoms have really increased in the last 3-4 months. Pt says that she has to use a thick pad, because she dribbles all day. She says she has no urge to urinate. She states increases at night.     HPI: The patient is a 85 y.o. female who presents today for urinary incontinence.  She states a week ago Monday she tells me she left the office and had no feelings of needing to go to the bathroom. She drove maybe 5 miles and when she got out of car she and noticed she had urine running all down her legs. She has no urge it just happens and she feels her pad over flowing. She states when she does any valsalva she will have mild leakages on occasions, but not every time. She has no loss of sensation in her buttocks. She has had to wear a pad for the past 2-3 years, but the excessive urination and not knowing is new. She has no dysuria, pain or blood in urine. No smell or color change. She did have a color change after she started to take vitamin B complex. She does seem to dribble all day as her pad is wet. She states it seems to happen when she is standing up and it happens about 3x/week.   -she is on 80mg  of lasix daily. I have tried to take her off of this but her legs have unbearable swelling. She has even tried to stop her lasix and it doesn't seem to help incontinence.   -she has 1/2 cup of caffeine daily.   She has had a complete hysterectomy. She has no problems emptying her bladder.   Review of Systems  Genitourinary: Positive for frequency. Negative for decreased urine volume, difficulty urinating, dysuria, enuresis, flank pain, hematuria, pelvic pain and urgency.  Neurological: Negative for dizziness and headaches.    Allergies Patient is allergic to dorzolamide hcl-timolol mal, tetanus toxoids, and hctz  [hydrochlorothiazide].  Past Medical History Patient  has a past medical history of Allergy, Arthritis, Bleeding of blood vessel, Blood in stool, Chronic kidney disease, Depression, Glaucoma, Heart murmur, History of chicken pox, History of recurrent UTIs, History of stomach ulcers, Hyperlipidemia, Hypertension, Memory loss, Rheumatic fever, Seizures ([place]), Stroke ([place]), TIA (transient ischemic attack) (10/22/2013), and Urine incontinence.  Surgical History Patient  has a past surgical history that includes Abdominal hysterectomy; gum transplant; Cataract extraction (Right, May 13, 2014); RIGHT/LEFT HEART CATH AND CORONARY ANGIOGRAPHY (N/A, 05/12/2018); THORACIC AORTOGRAM (N/A, 05/12/2018); Eye surgery (12/13/2018); Appendectomy (1983); and Tonsillectomy.  Family History Pateint's family history includes Alcohol abuse in her maternal grandfather, paternal grandfather, and sister; Arthritis in her brother, father, paternal grandmother, and sister; COPD in her sister; Cancer in her mother and sister; Diabetes in her brother and daughter; Drug abuse in her sister; Early death in her brother, brother, father, maternal grandfather, mother, and sister; Hearing loss in her father; Heart attack in her brother; Heart disease in her brother, daughter, father, paternal grandfather, and sister; Hyperlipidemia in her brother, daughter, father, maternal grandmother, paternal grandfather, and sister; Hypertension in her brother, father, maternal grandmother, paternal grandfather, and sister; Stroke in her maternal grandmother and paternal grandfather.  Social History Patient  reports that she quit smoking about 49 years ago. Her smoking use included cigarettes. She quit after 40.00 years of  use. She has never used smokeless tobacco. She reports that she does not drink alcohol and does not use drugs.    Objective: Vitals:   02/14/21 1351 02/14/21 1441  BP: (!) 164/73 138/80  Pulse: 90   Temp: 97.7 F (36.5 C)    TempSrc: Temporal   SpO2: 97%   Weight: 174 lb (78.9 kg)   Height: 5\' 2"  (1.575 m)     Body mass index is 31.83 kg/m.  Physical Exam Vitals reviewed.  Constitutional:      Appearance: Normal appearance. She is obese.  HENT:     Head: Normocephalic and atraumatic.  Cardiovascular:     Rate and Rhythm: Normal rate and regular rhythm.     Heart sounds: Murmur heard.    Pulmonary:     Effort: Pulmonary effort is normal.     Breath sounds: Normal breath sounds.  Abdominal:     General: Abdomen is flat. Bowel sounds are normal.     Palpations: Abdomen is soft.     Tenderness: There is no right CVA tenderness or left CVA tenderness.  Genitourinary:    Comments: Declined.  Skin:    General: Skin is warm.  Neurological:     General: No focal deficit present.     Mental Status: She is alert and oriented to person, place, and time.  Psychiatric:        Mood and Affect: Mood normal.        Behavior: Behavior normal.        Assessment/plan: 1. Incontinence in female Urine looks good. Culture pending. She declines any medication but wants to see specialist for other non medicine options. Ive tried to wean her off lasix, but she has horrible leg swelling. She tells me she stopped this herself and had no effect on the incontinence. I would like to try medicine, but she is really insistent she wants no more medicine and would like to see specialist. Needs uro-gyn exam but declined today as she will see specialist.   - POCT Urinalysis Dipstick - Urine Culture; Future - Urine Culture - Ambulatory referral to Urogynecology    This visit occurred during the SARS-CoV-2 public health emergency.  Safety protocols were in place, including screening questions prior to the visit, additional usage of staff PPE, and extensive cleaning of exam room while observing appropriate contact time as indicated for disinfecting solutions.     Return if symptoms worsen or fail to  improve.    Orma Flaming, MD [place]   02/16/2021

## 2021-02-14 NOTE — Patient Instructions (Signed)
Urine looks good. Culture is pending.  Referral placed for urogynecology. They will call you with this. Medication may be your only option, but they will do more in depth examination.    Good to see you!  D.r Rogers Blocker

## 2021-02-15 LAB — URINE CULTURE
MICRO NUMBER:: 11552578
SPECIMEN QUALITY:: ADEQUATE

## 2021-02-17 ENCOUNTER — Telehealth: Payer: Self-pay | Admitting: Lactation Services

## 2021-02-17 NOTE — Telephone Encounter (Signed)
Patient called and LM on [facility] voicemail. She reports her MD referred her for Urinary Continence. She does have an appointment scheduled with Uro-Gynecology on 3/30. Will forward to [place].

## 2021-02-19 DIAGNOSIS — M0600 Rheumatoid arthritis without rheumatoid factor, unspecified site: Secondary | ICD-10-CM | POA: Diagnosis not present

## 2021-03-10 ENCOUNTER — Ambulatory Visit (INDEPENDENT_AMBULATORY_CARE_PROVIDER_SITE_OTHER): Payer: Medicare HMO

## 2021-03-10 DIAGNOSIS — Z0000 Encounter for general adult medical examination without abnormal findings: Secondary | ICD-10-CM | POA: Diagnosis not present

## 2021-03-10 NOTE — Progress Notes (Signed)
Virtual Visit via Telephone Note  I connected with  Cheryl Wood on 03/10/21 at  8:00 AM EDT by telephone and verified that I am speaking with the correct person using two identifiers.  Medicare Annual Wellness visit completed telephonically due to Covid-19 pandemic.   Persons participating in this call: This Health Coach and this patient.   Location: Patient: Home Provider: Office    I discussed the limitations, risks, security and privacy concerns of performing an evaluation and management service by telephone and the availability of in person appointments. The patient expressed understanding and agreed to proceed.  Unable to perform video visit due to video visit attempted and failed and/or patient does not have video capability.   Some vital signs may be absent or patient reported.   Willette Brace, LPN    Subjective:   Cheryl Wood is a 85 y.o. female who presents for Medicare Annual (Subsequent) preventive examination.  Review of Systems     Cardiac Risk Factors include: advanced age (>19men, >22 women);diabetes mellitus;dyslipidemia;hypertension;obesity (BMI >30kg/m2)     Objective:    Today's Vitals   03/10/21 0807  PainSc: 5    There is no height or weight on file to calculate BMI.  Advanced Directives 03/10/2021 02/27/2020 01/30/2020 05/12/2018 10/22/2013  Does Patient Have a Medical Advance Directive? Yes Yes Yes Yes Patient has advance directive, copy not in chart  Type of Advance Directive Healthcare Power of [place] of [place];Living will [place];Living will  Does patient want to make changes to medical advance directive? - No - Patient declined No - Patient declined No - Patient declined -  Copy of [place] in Chart? No - copy requested No - copy requested No - copy requested No - copy requested Copy requested from other (Comment)   Pre-existing out of facility DNR order (yellow form or pink MOST form) - - - - No    Current Medications (verified) Outpatient Encounter Medications as of 03/10/2021  Medication Sig  . acetaminophen (TYLENOL) 650 MG CR tablet Take 650 mg by mouth every 8 (eight) hours as needed for pain.  Marland Kitchen clopidogrel (PLAVIX) 75 MG tablet Take 1 tablet (75 mg total) by mouth every other day.  . famciclovir (FAMVIR) 500 MG tablet Take 500 mg by mouth daily.   . famotidine (PEPCID) 20 MG tablet TAKE ONE TABLET BY MOUTH TWICE A DAY  . furosemide (LASIX) 80 MG tablet TAKE TWO TABLETS BY MOUTH DAILY  . inFLIXimab (REMICADE) 100 MG injection Inject into the vein.  Marland Kitchen KLOR-CON M20 20 MEQ tablet TAKE ONE TABLET BY MOUTH DAILY  . loratadine (CLARITIN) 10 MG tablet Take 10 mg by mouth daily.  Marland Kitchen losartan (COZAAR) 100 MG tablet TAKE ONE TABLET BY MOUTH DAILY  . lovastatin (MEVACOR) 20 MG tablet TAKE ONE TABLET BY MOUTH EVERY EVENING  . pramipexole (MIRAPEX) 0.25 MG tablet TAKE ONE TABLET BY MOUTH EVERY NIGHT AT BEDTIME  . prednisoLONE acetate (PRED FORTE) 1 % ophthalmic suspension SMARTSIG:In Eye(s)  . sertraline (ZOLOFT) 25 MG tablet TAKE ONE TABLET BY MOUTH DAILY  . erythromycin ophthalmic ointment Place into the left eye every 6 (six) hours. (Patient not taking: Reported on 03/10/2021)   No facility-administered encounter medications on file as of 03/10/2021.    Allergies (verified) Dorzolamide hcl-timolol mal, Tetanus toxoids, and Hctz [hydrochlorothiazide]   History: Past Medical History:  Diagnosis Date  . Allergy   .  Arthritis   . Bleeding of blood vessel    ext. genitalia area  . Blood in stool   . Chronic kidney disease   . Depression   . Glaucoma   . Heart murmur   . History of chicken pox   . History of recurrent UTIs   . History of stomach ulcers   . Hyperlipidemia   . Hypertension   . Memory loss   . Rheumatic fever   . Seizures ([place])   . Stroke ([place])   . TIA (transient ischemic  attack) 10/22/2013  . Urine incontinence    Past Surgical History:  Procedure Laterality Date  . ABDOMINAL HYSTERECTOMY    . APPENDECTOMY  1983  . CATARACT EXTRACTION Right May 13, 2014  . EYE SURGERY  12/13/2018   stent inserted in left eye  . gum transplant    . RIGHT/LEFT HEART CATH AND CORONARY ANGIOGRAPHY N/A 05/12/2018   Procedure: RIGHT/LEFT HEART CATH AND CORONARY ANGIOGRAPHY;  Surgeon: Belva Crome, MD;  Location: [facility];  Service: Cardiovascular;  Laterality: N/A;  . THORACIC AORTOGRAM N/A 05/12/2018   Procedure: THORACIC AORTOGRAM;  Surgeon: Belva Crome, MD;  Location: [facility];  Service: Cardiovascular;  Laterality: N/A;  . TONSILLECTOMY     Family History  Problem Relation Age of Onset  . Cancer Mother   . Early death Mother   . Heart disease Father   . Arthritis Father   . Early death Father   . Hearing loss Father   . Hypertension Father   . Hyperlipidemia Father   . Cancer Sister   . Arthritis Sister   . Arthritis Brother   . Diabetes Brother   . Heart attack Brother   . Heart disease Brother   . Hyperlipidemia Brother   . Hypertension Brother   . Diabetes Daughter   . Heart disease Daughter   . Hyperlipidemia Daughter   . Hyperlipidemia Maternal Grandmother   . Hypertension Maternal Grandmother   . Stroke Maternal Grandmother   . Alcohol abuse Maternal Grandfather   . Early death Maternal Grandfather        tuberculosis  . Arthritis Paternal Grandmother   . Alcohol abuse Paternal Grandfather   . Heart disease Paternal Grandfather   . Hyperlipidemia Paternal Grandfather   . Hypertension Paternal Grandfather   . Stroke Paternal Grandfather   . Alcohol abuse Sister   . COPD Sister   . Drug abuse Sister   . Early death Sister   . Heart disease Sister   . Hypertension Sister   . Hyperlipidemia Sister   . Early death Brother   . Early death Brother    Social History   Socioeconomic History  . Marital status: Divorced     Spouse name: Not on file  . Number of children: 2  . Years of education: college  . Highest education level: Not on file  Occupational History    Employer: RETIRED  Tobacco Use  . Smoking status: Former Smoker    Years: 40.00    Types: Cigarettes    Quit date: 11/05/1971    Years since quitting: 49.3  . Smokeless tobacco: Never Used  Vaping Use  . Vaping Use: Never used  Substance and Sexual Activity  . Alcohol use: No    Alcohol/week: 0.0 standard drinks  . Drug use: No  . Sexual activity: Not Currently  Other Topics Concern  . Not on file  Social History Narrative   Patient  lives at home alone   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   Worked at [employer] for 18 years   Daughters in [place] and in [place]   Caffeine Use: 2-3 cups daily   Social Determinants of Health   Financial Resource Strain: Low Risk   . Difficulty of Paying Living Expenses: Not hard at all  Food Insecurity: No Food Insecurity  . Worried About Charity fundraiser in the Last Year: Never true  . Ran Out of Food in the Last Year: Never true  Transportation Needs: No Transportation Needs  . Lack of Transportation (Medical): No  . Lack of Transportation (Non-Medical): No  Physical Activity: Inactive  . Days of Exercise per Week: 0 days  . Minutes of Exercise per Session: 0 min  Stress: No Stress Concern Present  . Feeling of Stress : Not at all  Social Connections: Moderately Isolated  . Frequency of Communication with Friends and Family: More than three times a week  . Frequency of Social Gatherings with Friends and Family: More than three times a week  . Attends Religious Services: More than 4 times per year  . Active Member of Clubs or Organizations: No  . Attends Archivist Meetings: Never  . Marital Status: Divorced    Tobacco Counseling Counseling given: Not Answered   Clinical Intake:     Pain : 0-10 Pain Score: 5  Pain Type:  Chronic pain Pain Location: Generalized (arthritis) Pain Descriptors / Indicators: Aching,Sharp Pain Frequency: Constant     BMI - recorded: 31.83 Nutritional Status: BMI > 30  Obese Nutritional Risks: None Diabetes: Yes CBG done?: No Did pt. bring in CBG monitor from home?: No  How often do you need to have someone help you when you read instructions, pamphlets, or other written materials from your doctor or pharmacy?: 1 - Never  Diabetic?Nutrition Risk Assessment:  Has the patient had any N/V/D within the last 2 months?  No  Does the patient have any non-healing wounds?  No  Has the patient had any unintentional weight loss or weight gain?  No   Diabetes:  Is the patient diabetic?  Yes  If diabetic, was a CBG obtained today?  No  Did the patient bring in their glucometer from home?  No  How often do you monitor your CBG's? N/A.   Financial Strains and Diabetes Management:  Are you having any financial strains with the device, your supplies or your medication? No .  Does the patient want to be seen by Chronic Care Management for management of their diabetes?  No  Would the patient like to be referred to a Nutritionist or for Diabetic Management?  No   Diabetic Exams:  Diabetic Eye Exam: Completed 12/27/20 Diabetic Foot Exam: Overdue, Pt has been advised about the importance in completing this exam. Pt is scheduled for diabetic foot exam on next appt .   Interpreter Needed?: No  Information entered by :: Charlott Rakes, LPN   Activities of Daily Living In your present state of health, do you have any difficulty performing the following activities: 03/10/2021  Hearing? Y  Comment wears hearing aids  Vision? N  Difficulty concentrating or making decisions? N  Walking or climbing stairs? Y  Comment avoid stairs related to sob  Dressing or bathing? N  Doing errands, shopping? N  Preparing Food and eating ? N  Using the Toilet? N  In the past six months,  have you  accidently leaked urine? Y  Do you have problems with loss of bowel control? N  Managing your Medications? N  Managing your Finances? N  Housekeeping or managing your Housekeeping? N  Some recent data might be hidden    Patient Care Team: Orma Flaming, MD as PCP - General (Family Medicine)  Indicate any recent Medical Services you may have received from other than [facility] providers in the past year (date may be approximate).     Assessment:   This is a routine wellness examination for Cheryl Wood.  Hearing/Vision screen  Hearing Screening   125Hz  250Hz  500Hz  1000Hz  2000Hz  3000Hz  4000Hz  6000Hz  8000Hz   Right ear:           Left ear:           Comments: Pt wears hearing aids   Vision Screening Comments: Pt follows up with Dr Brigitte Pulse and Dr Doree Albee for eye exams   Dietary issues and exercise activities discussed: Current Exercise Habits: The patient has a physically strenuous job, but has no regular exercise apart from work.  Goals    . Patient Stated     Being able to get up is a goal       Depression Screen PHQ 2/9 Scores 03/10/2021 01/03/2021 10/03/2020 07/10/2020 07/03/2020 07/03/2020 01/30/2020  PHQ - 2 Score 0 0 0 0 2 0 0  PHQ- 9 Score - - - - 5 - -    Fall Risk Fall Risk  03/10/2021 01/03/2021 10/03/2020 07/10/2020 07/03/2020  Falls in the past year? 0 0 0 0 1  Number falls in past yr: 0 - - - 0  Injury with Fall? 0 - - - 1  Risk for fall due to : Impaired vision No Fall Risks No Fall Risks - -  Follow up Falls prevention discussed - - - -    FALL RISK PREVENTION PERTAINING TO THE HOME:  Any stairs in or around the home? No  If so, are there any without handrails? No  Home free of loose throw rugs in walkways, pet beds, electrical cords, etc? Yes  Adequate lighting in your home to reduce risk of falls? Yes   ASSISTIVE DEVICES UTILIZED TO PREVENT FALLS:  Life alert? No  Use of a cane, walker or w/c? No  Grab bars in the bathroom? Yes  Shower chair or bench in shower? Yes  Elevated  toilet seat or a handicapped toilet? No   TIMED UP AND GO:  Was the test performed? No     Cognitive Function: MMSE - Mini Mental State Exam 01/28/2015 07/18/2014  Orientation to time 5 5  Orientation to Place 5 5  Registration 3 3  Attention/ Calculation 4 2  Recall 2 3  Language- name 2 objects 2 2  Language- repeat 1 1  Language- follow 3 step command 3 3  Language- read & follow direction 1 1  Write a sentence 1 1  Copy design 1 0  Total score 28 26     6CIT Screen 03/10/2021 01/30/2020  What Year? 0 points 0 points  What month? 0 points 0 points  What time? - 0 points  Count back from 20 0 points 0 points  Months in reverse 0 points 0 points  Repeat phrase 2 points 2 points  Total Score - 2    Immunizations Immunization History  Administered Date(s) Administered  . Fluad Quad(high Dose 65+) 09/29/2019, 10/03/2020  . Influenza Split 08/30/2017  . Influenza-Unspecified 09/28/2018  . Moderna  SARS-COV2 Booster Vaccination 01/31/2021  . Moderna Sars-Covid-2 Vaccination 02/09/2020, 03/08/2020, 08/27/2020  . Pneumococcal Conjugate-13 03/29/2015  . Pneumococcal Polysaccharide-23 11/01/2019      Flu Vaccine status: Up to date  Pneumococcal vaccine status: Up to date  Covid-19 vaccine status: Completed vaccines  Qualifies for Shingles Vaccine? Yes   Zostavax completed No   Shingrix Completed?: No.    Education has been provided regarding the importance of this vaccine. Patient has been advised to call insurance company to determine out of pocket expense if they have not yet received this vaccine. Advised may also receive vaccine at local pharmacy or Health Dept. Verbalized acceptance and understanding.  Screening Tests Health Maintenance  Topic Date Due  . FOOT EXAM  10/31/2020  . MAMMOGRAM  03/25/2021  . HEMOGLOBIN A1C  07/03/2021  . OPHTHALMOLOGY EXAM  12/27/2021  . INFLUENZA VACCINE  Completed  . DEXA SCAN  Completed  . COVID-19 Vaccine  Completed  . PNA  vac Low Risk Adult  Completed  . HPV VACCINES  Aged Out  . TETANUS/TDAP  Discontinued    Health Maintenance  Health Maintenance Due  Topic Date Due  . FOOT EXAM  10/31/2020    Colorectal cancer screening: No longer required.   Mammogram status: Completed 03/25/20. Repeat every year  Bone Density status: Completed 12/26/10. Results reflect: Bone density results: NORMAL. Repeat every 0 years.    Additional Screening:  Vision Screening: Recommended annual ophthalmology exams for early detection of glaucoma and other disorders of the eye. Is the patient up to date with their annual eye exam?  Yes  Who is the provider or what is the name of the office in which the patient attends annual eye exams? Dr Brigitte Pulse and Dr Doree Albee  If pt is not established with a provider, would they like to be referred to a provider to establish care? No .   Dental Screening: Recommended annual dental exams for proper oral hygiene  Community Resource Referral / Chronic Care Management: CRR required this visit?  No   CCM required this visit?  No      Plan:     I have personally reviewed and noted the following in the patient's chart:   . Medical and social history . Use of alcohol, tobacco or illicit drugs  . Current medications and supplements . Functional ability and status . Nutritional status . Physical activity . Advanced directives . List of other physicians . Hospitalizations, surgeries, and ER visits in previous 12 months . Vitals . Screenings to include cognitive, depression, and falls . Referrals and appointments  In addition, I have reviewed and discussed with patient certain preventive protocols, quality metrics, and best practice recommendations. A written personalized care plan for preventive services as well as general preventive health recommendations were provided to patient.     Willette Brace, LPN   [id number]/57/3220   Nurse Notes: None

## 2021-03-10 NOTE — Patient Instructions (Addendum)
Cheryl Wood , Thank you for taking time to come for your Medicare Wellness Visit. I appreciate your ongoing commitment to your health goals. Please review the following plan we discussed and let me know if I can assist you in the future.   Screening recommendations/referrals: Colonoscopy: No longer required Mammogram: Done 03/25/20 Bone Density: Done 12/26/10 Recommended yearly ophthalmology/optometry visit for glaucoma screening and checkup Recommended yearly dental visit for hygiene and checkup  Vaccinations: Influenza vaccine: Done 10/03/20 Pneumococcal vaccine: Up to date Shingles vaccine: Shingrix discussed. Please contact your pharmacy for coverage information.    Covid-19:Completed 2/12 3/12, 8/31, & 01/31/21  Advanced directives: Please bring a copy of your health care power of attorney and living will to the office at your convenience.  Conditions/risks identified: To get up every day   Next appointment: Follow up in one year for your annual wellness visit    Preventive Care 65 Years and Older, Female Preventive care refers to lifestyle choices and visits with your health care provider that can promote health and wellness. What does preventive care include?  A yearly physical exam. This is also called an annual well check.  Dental exams once or twice a year.  Routine eye exams. Ask your health care provider how often you should have your eyes checked.  Personal lifestyle choices, including:  Daily care of your teeth and gums.  Regular physical activity.  Eating a healthy diet.  Avoiding tobacco and drug use.  Limiting alcohol use.  Practicing safe sex.  Taking low-dose aspirin every day.  Taking vitamin and mineral supplements as recommended by your health care provider. What happens during an annual well check? The services and screenings done by your health care provider during your annual well check will depend on your age, overall health, lifestyle risk  factors, and family history of disease. Counseling  Your health care provider may ask you questions about your:  Alcohol use.  Tobacco use.  Drug use.  Emotional well-being.  Home and relationship well-being.  Sexual activity.  Eating habits.  History of falls.  Memory and ability to understand (cognition).  Work and work Statistician.  Reproductive health. Screening  You may have the following tests or measurements:  Height, weight, and BMI.  Blood pressure.  Lipid and cholesterol levels. These may be checked every 5 years, or more frequently if you are over 56 years old.  Skin check.  Lung cancer screening. You may have this screening every year starting at age 73 if you have a 30-pack-year history of smoking and currently smoke or have quit within the past 15 years.  Fecal occult blood test (FOBT) of the stool. You may have this test every year starting at age 5.  Flexible sigmoidoscopy or colonoscopy. You may have a sigmoidoscopy every 5 years or a colonoscopy every 10 years starting at age 7.  Hepatitis C blood test.  Hepatitis B blood test.  Sexually transmitted disease (STD) testing.  Diabetes screening. This is done by checking your blood sugar (glucose) after you have not eaten for a while (fasting). You may have this done every 1-3 years.  Bone density scan. This is done to screen for osteoporosis. You may have this done starting at age 38.  Mammogram. This may be done every 1-2 years. Talk to your health care provider about how often you should have regular mammograms. Talk with your health care provider about your test results, treatment options, and if necessary, the need for more tests. Vaccines  Your health care provider may recommend certain vaccines, such as:  Influenza vaccine. This is recommended every year.  Tetanus, diphtheria, and acellular pertussis (Tdap, Td) vaccine. You may need a Td booster every 10 years.  Zoster vaccine. You  may need this after age 26.  Pneumococcal 13-valent conjugate (PCV13) vaccine. One dose is recommended after age 24.  Pneumococcal polysaccharide (PPSV23) vaccine. One dose is recommended after age 3. Talk to your health care provider about which screenings and vaccines you need and how often you need them. This information is not intended to replace advice given to you by your health care provider. Make sure you discuss any questions you have with your health care provider. Document Released: 01/10/2016 Document Revised: 09/02/2016 Document Reviewed: 10/15/2015 Elsevier Interactive Patient Education  2017 [place] Prevention in the Home Falls can cause injuries. They can happen to people of all ages. There are many things you can do to make your home safe and to help prevent falls. What can I do on the outside of my home?  Regularly fix the edges of walkways and driveways and fix any cracks.  Remove anything that might make you trip as you walk through a door, such as a raised step or threshold.  Trim any bushes or trees on the path to your home.  Use bright outdoor lighting.  Clear any walking paths of anything that might make someone trip, such as rocks or tools.  Regularly check to see if handrails are loose or broken. Make sure that both sides of any steps have handrails.  Any raised decks and porches should have guardrails on the edges.  Have any leaves, snow, or ice cleared regularly.  Use sand or salt on walking paths during winter.  Clean up any spills in your garage right away. This includes oil or grease spills. What can I do in the bathroom?  Use night lights.  Install grab bars by the toilet and in the tub and shower. Do not use towel bars as grab bars.  Use non-skid mats or decals in the tub or shower.  If you need to sit down in the shower, use a plastic, non-slip stool.  Keep the floor dry. Clean up any water that spills on the floor as soon as  it happens.  Remove soap buildup in the tub or shower regularly.  Attach bath mats securely with double-sided non-slip rug tape.  Do not have throw rugs and other things on the floor that can make you trip. What can I do in the bedroom?  Use night lights.  Make sure that you have a light by your bed that is easy to reach.  Do not use any sheets or blankets that are too big for your bed. They should not hang down onto the floor.  Have a firm chair that has side arms. You can use this for support while you get dressed.  Do not have throw rugs and other things on the floor that can make you trip. What can I do in the kitchen?  Clean up any spills right away.  Avoid walking on wet floors.  Keep items that you use a lot in easy-to-reach places.  If you need to reach something above you, use a strong step stool that has a grab bar.  Keep electrical cords out of the way.  Do not use floor polish or wax that makes floors slippery. If you must use wax, use non-skid floor wax.  Do  not have throw rugs and other things on the floor that can make you trip. What can I do with my stairs?  Do not leave any items on the stairs.  Make sure that there are handrails on both sides of the stairs and use them. Fix handrails that are broken or loose. Make sure that handrails are as long as the stairways.  Check any carpeting to make sure that it is firmly attached to the stairs. Fix any carpet that is loose or worn.  Avoid having throw rugs at the top or bottom of the stairs. If you do have throw rugs, attach them to the floor with carpet tape.  Make sure that you have a light switch at the top of the stairs and the bottom of the stairs. If you do not have them, ask someone to add them for you. What else can I do to help prevent falls?  Wear shoes that:  Do not have high heels.  Have rubber bottoms.  Are comfortable and fit you well.  Are closed at the toe. Do not wear sandals.  If  you use a stepladder:  Make sure that it is fully opened. Do not climb a closed stepladder.  Make sure that both sides of the stepladder are locked into place.  Ask someone to hold it for you, if possible.  Clearly mark and make sure that you can see:  Any grab bars or handrails.  First and last steps.  Where the edge of each step is.  Use tools that help you move around (mobility aids) if they are needed. These include:  Canes.  Walkers.  Scooters.  Crutches.  Turn on the lights when you go into a dark area. Replace any light bulbs as soon as they burn out.  Set up your furniture so you have a clear path. Avoid moving your furniture around.  If any of your floors are uneven, fix them.  If there are any pets around you, be aware of where they are.  Review your medicines with your doctor. Some medicines can make you feel dizzy. This can increase your chance of falling. Ask your doctor what other things that you can do to help prevent falls. This information is not intended to replace advice given to you by your health care provider. Make sure you discuss any questions you have with your health care provider. Document Released: 10/10/2009 Document Revised: 05/21/2016 Document Reviewed: 01/18/2015 Elsevier Interactive Patient Education  2017 [employer].

## 2021-03-18 DIAGNOSIS — I10 Essential (primary) hypertension: Secondary | ICD-10-CM | POA: Diagnosis not present

## 2021-03-18 DIAGNOSIS — H4042X2 Glaucoma secondary to eye inflammation, left eye, moderate stage: Secondary | ICD-10-CM | POA: Diagnosis not present

## 2021-03-18 DIAGNOSIS — Z961 Presence of intraocular lens: Secondary | ICD-10-CM | POA: Diagnosis not present

## 2021-03-18 DIAGNOSIS — H209 Unspecified iridocyclitis: Secondary | ICD-10-CM | POA: Diagnosis not present

## 2021-03-18 DIAGNOSIS — H35033 Hypertensive retinopathy, bilateral: Secondary | ICD-10-CM | POA: Diagnosis not present

## 2021-03-19 DIAGNOSIS — N3944 Nocturnal enuresis: Secondary | ICD-10-CM | POA: Diagnosis not present

## 2021-03-19 DIAGNOSIS — R351 Nocturia: Secondary | ICD-10-CM | POA: Diagnosis not present

## 2021-03-19 DIAGNOSIS — N3946 Mixed incontinence: Secondary | ICD-10-CM | POA: Diagnosis not present

## 2021-03-19 DIAGNOSIS — R350 Frequency of micturition: Secondary | ICD-10-CM | POA: Diagnosis not present

## 2021-03-21 NOTE — Progress Notes (Addendum)
[facility] New Patient Evaluation and Consultation  Referring Provider: Orma Flaming, MD PCP: Orma Flaming, MD Date of Service: 03/26/2021  SUBJECTIVE Chief Complaint: New Patient (Initial Visit)  History of Present Illness: Cheryl Wood is a 85 y.o. White or Caucasian female seen in consultation at the request of Dr. Rogers Blocker for evaluation of incontinence.    Review of records significant for: Has urinary leakage without warning. Dribbles all day. Stopped lasix but did not see improvement with her leakage. She does not want to use any more medication.   Urinary Symptoms: Leaks urine with cough/ sneeze, with a full bladder, with movement to the bathroom, with urgency, without sensation, while asleep and continuously. Leaks without warning throughout the day. Tries to go every 2 hours to empty bladder. Also leaks with bending and lifting.   Leaks "all day" Pad use: 1-2 pads per day.   She is bothered by her UI symptoms. Pt reports that she restarted lasix due to swelling, so is now taking it again.  Has not tried medication for leakage. Has glaucoma, unsure what kind. Has stent in her left eye.   Day time voids- every 2 hours.  Nocturia: 1-2 times per night to void. Voiding dysfunction: she empties her bladder well.  does not use a catheter to empty bladder.  When urinating, she feels dribbling after finishing Drinks: 2 sips coffee with creamer in AM,  3- 12oz sprite zero, 32 oz water per day  UTIs: 0 UTI's in the last year.   Denies history of blood in urine and kidney or bladder stones  Pelvic Organ Prolapse Symptoms:                  She Denies a feeling of a bulge the vaginal area.  Bowel Symptom: Bowel movements: 1-2 time(s) per day Stool consistency: soft  Straining: no.  Splinting: no.  Incomplete evacuation: no.  She Admits to accidental bowel leakage / fecal incontinence- has been going on several times per year.   Occurs: 2-3 time(s) per  day  Consistency with leakage: soft  Bowel regimen: fiber- metamucil daily Last colonoscopy: Date 2006, Results negative but had some hemorrhoids  Sexual Function Sexually active: no.   Pelvic Pain Denies pelvic pain    Past Medical History:  Past Medical History:  Diagnosis Date  . Allergy   . Arthritis   . Bleeding of blood vessel    ext. genitalia area  . Blood in stool   . Chronic kidney disease   . Depression   . Glaucoma   . Heart murmur   . History of chicken pox   . History of recurrent UTIs   . History of stomach ulcers   . Hyperlipidemia   . Hypertension   . Memory loss   . Rheumatic fever   . Seizures ([place])   . Stroke ([place])   . TIA (transient ischemic attack) 10/22/2013  . Urine incontinence      Past Surgical History:   Past Surgical History:  Procedure Laterality Date  . ABDOMINAL HYSTERECTOMY    . APPENDECTOMY  1983  . CATARACT EXTRACTION Right May 13, 2014  . EYE SURGERY  12/13/2018   stent inserted in left eye  . gum transplant    . RIGHT/LEFT HEART CATH AND CORONARY ANGIOGRAPHY N/A 05/12/2018   Procedure: RIGHT/LEFT HEART CATH AND CORONARY ANGIOGRAPHY;  Surgeon: Belva Crome, MD;  Location: [facility];  Service: Cardiovascular;  Laterality: N/A;  . THORACIC  AORTOGRAM N/A 05/12/2018   Procedure: THORACIC AORTOGRAM;  Surgeon: Belva Crome, MD;  Location: [facility];  Service: Cardiovascular;  Laterality: N/A;  . TONSILLECTOMY       Past OB/GYN History: OB History  Gravida Para Term Preterm AB Living  4       2 2   SAB IAB Ectopic Multiple Live Births               # Outcome Date GA Lbr Len/2nd Weight Sex Delivery Anes PTL Lv  4 Gravida           3 Gravida           2 AB           1 AB           NSVD x2  Menopausal: Yes, Denies vaginal bleeding since menopause S/p abdominal hysterectomy for heavy periods/ endometriosis.    Medications: She has a current medication list which includes the following  prescription(s): acetaminophen, clopidogrel, famciclovir, famotidine, furosemide, remicade, klor-con m20, loratadine, losartan, lovastatin, mirabegron er, prednisolone acetate, erythromycin, pramipexole, and sertraline.   Allergies: Patient is allergic to dorzolamide hcl-timolol mal, tetanus toxoids, and hctz [hydrochlorothiazide].   Social History:  Social History   Tobacco Use  . Smoking status: Former Smoker    Years: 40.00    Types: Cigarettes    Quit date: 11/05/1971    Years since quitting: 49.4  . Smokeless tobacco: Never Used  Vaping Use  . Vaping Use: Never used  Substance Use Topics  . Alcohol use: No    Alcohol/week: 0.0 standard drinks  . Drug use: No    Relationship status: single She lives alone.   She is employed- computer work, 3x week  Regular exercise: walking at work   Family History:   Family History  Problem Relation Age of Onset  . Cancer Mother   . Early death Mother   . Heart disease Father   . Arthritis Father   . Early death Father   . Hearing loss Father   . Hypertension Father   . Hyperlipidemia Father   . Cancer Sister   . Arthritis Sister   . Arthritis Brother   . Diabetes Brother   . Heart attack Brother   . Heart disease Brother   . Hyperlipidemia Brother   . Hypertension Brother   . Diabetes Daughter   . Heart disease Daughter   . Hyperlipidemia Daughter   . Hyperlipidemia Maternal Grandmother   . Hypertension Maternal Grandmother   . Stroke Maternal Grandmother   . Alcohol abuse Maternal Grandfather   . Early death Maternal Grandfather        tuberculosis  . Arthritis Paternal Grandmother   . Alcohol abuse Paternal Grandfather   . Heart disease Paternal Grandfather   . Hyperlipidemia Paternal Grandfather   . Hypertension Paternal Grandfather   . Stroke Paternal Grandfather   . Alcohol abuse Sister   . COPD Sister   . Drug abuse Sister   . Early death Sister   . Heart disease Sister   . Hypertension Sister   .  Hyperlipidemia Sister   . Early death Brother   . Early death Brother      Review of Systems: Review of Systems  Constitutional: Negative for fever, malaise/fatigue and weight loss.  Respiratory: Positive for cough, shortness of breath and wheezing.   Cardiovascular: Positive for leg swelling. Negative for chest pain and palpitations.  Gastrointestinal: Negative for abdominal pain and  blood in stool.  Genitourinary: Negative for dysuria.  Musculoskeletal: Negative for myalgias.  Skin: Negative for rash.  Neurological: Negative for dizziness and headaches.  Endo/Heme/Allergies: Bruises/bleeds easily.  Psychiatric/Behavioral: Positive for depression. The patient is not nervous/anxious.      OBJECTIVE Physical Exam: Vitals:   03/26/21 0818  BP: 132/78  Pulse: 78  Height: 5\' 1"  (1.549 m)    Physical Exam Constitutional:      General: She is not in acute distress. Pulmonary:     Effort: Pulmonary effort is normal.  Abdominal:     General: There is no distension.     Palpations: Abdomen is soft.     Tenderness: There is no abdominal tenderness. There is no rebound.     Comments: +pfannenstiel incision noted  Musculoskeletal:        General: No swelling. Normal range of motion.  Skin:    General: Skin is warm and dry.     Findings: No rash.  Neurological:     Mental Status: She is alert and oriented to person, place, and time.  Psychiatric:        Mood and Affect: Mood normal.        Behavior: Behavior normal.     GU / Detailed Urogynecologic Evaluation:  Pelvic Exam: Normal external female genitalia; Bartholin's and Skene's glands normal in appearance; urethral meatus normal in appearance, no urethral masses or discharge.   CST: negative  s/p hysterectomy: Speculum exam reveals normal vaginal mucosa with  atrophy and normal vaginal cuff.  Adnexa no mass, fullness, tenderness.     Pelvic floor strength II/V, puborectalis II/V external anal sphincter IV/V  Pelvic  floor musculature: Right levator tender, Right obturator tender, Left levator tender, Left obturator tender  POP-Q:   POP-Q  -2                                            Aa   -2                                           Ba  -6                                              C   1.5                                            Gh  3                                            Pb  6.5                                            tvl   -1.5  Ap  -1.5                                            Bp                                                 D     Rectal Exam:  Normal sphincter tone, small distal rectocele, enterocoele not present, no rectal masses, no sign of dyssynergia when asking the patient to bear down.  Post-Void Residual (PVR) by Bladder Scan: In order to evaluate bladder emptying, we discussed obtaining a postvoid residual and she agreed to this procedure.  Procedure: The ultrasound unit was placed on the patient's abdomen in the suprapubic region after the patient had voided. A PVR of 8 ml was obtained by bladder scan.  Laboratory Results: POC urine: negative I visualized the urine specimen, noting the specimen to be clear yellow  ASSESSMENT AND PLAN Cheryl Wood is a 85 y.o. with:  1. Overactive bladder   2. Urinary frequency   3. SUI (stress urinary incontinence, female)   4. Full incontinence of feces    1. OAB We discussed the symptoms of overactive bladder (OAB), which include urinary urgency, urinary frequency, nocturia, with or without urge incontinence.  While we do not know the exact etiology of OAB, several treatment options exist. We discussed management including behavioral therapy (decreasing bladder irritants, urge suppression strategies, timed voids, bladder retraining), physical therapy, medication; for refractory cases posterior tibial nerve stimulation, sacral neuromodulation, and intravesical botulinum toxin  injection. She states she would not be able to self-catheterize so would not be a candidate for botox.  - She is not a candidate for anticholinergics due to glaucoma.  - For Beta-3 agonist medication, we discussed the potential side effect of elevated blood pressure which is more likely to occur in individuals with uncontrolled hypertension. Prescribed Myrbetriq 25mg . She will notify us if this is not covered by her insurance so we can start an authorization.   2. SUI - For treatment of stress urinary incontinence,  non-surgical options include expectant management, weight loss, physical therapy, as well as a pessary.  She is not interested in surgical options at this time.  - She would like to try a pessary. She will return for a pessary fitting.   3. Fecal incontinence  - Treatment options include anti-diarrhea medication (loperamide/ Imodium OTC or prescription lomotil), fiber supplements, physical therapy, and possible sacral neuromodulation or surgery.   - She is currently using metamucil daily.  - We discussed the option of PT or SNM but she would like expectant management for this at this time.    No significant prolapse noted on exam today.   Return for pessary fitting.   Jaquita Folds, MD   Medical Decision Making:  - Reviewed/ ordered a clinical laboratory test - Review and summation of prior records

## 2021-03-26 ENCOUNTER — Other Ambulatory Visit: Payer: Self-pay

## 2021-03-26 ENCOUNTER — Ambulatory Visit (INDEPENDENT_AMBULATORY_CARE_PROVIDER_SITE_OTHER): Payer: Medicare HMO | Admitting: Obstetrics and Gynecology

## 2021-03-26 ENCOUNTER — Encounter: Payer: Self-pay | Admitting: Obstetrics and Gynecology

## 2021-03-26 VITALS — BP 132/78 | HR 78 | Ht 61.0 in

## 2021-03-26 DIAGNOSIS — R159 Full incontinence of feces: Secondary | ICD-10-CM | POA: Diagnosis not present

## 2021-03-26 DIAGNOSIS — N393 Stress incontinence (female) (male): Secondary | ICD-10-CM

## 2021-03-26 DIAGNOSIS — N3281 Overactive bladder: Secondary | ICD-10-CM | POA: Diagnosis not present

## 2021-03-26 DIAGNOSIS — R350 Frequency of micturition: Secondary | ICD-10-CM

## 2021-03-26 LAB — POCT URINALYSIS DIPSTICK
Appearance: NORMAL
Bilirubin, UA: NEGATIVE
Blood, UA: NEGATIVE
Glucose, UA: NEGATIVE
Ketones, UA: NEGATIVE
Leukocytes, UA: NEGATIVE
Nitrite, UA: NEGATIVE
Protein, UA: NEGATIVE
Spec Grav, UA: 1.02 (ref 1.010–1.025)
Urobilinogen, UA: 0.2 E.U./dL
pH, UA: 5 (ref 5.0–8.0)

## 2021-03-26 MED ORDER — MIRABEGRON ER 25 MG PO TB24: 25.0000 mg | ORAL_TABLET | Freq: Every day | ORAL | 5 refills | Status: DC

## 2021-03-26 NOTE — Patient Instructions (Addendum)
Today we talked about ways to manage bladder urgency such as altering your diet to avoid irritative beverages and foods (bladder diet) as well as attempting to decrease stress and other exacerbating factors.    The Most Bothersome Foods* The Least Bothersome Foods*  Coffee - Regular & Decaf Tea - caffeinated Carbonated beverages - cola, non-colas, diet & caffeine-free Alcohols - Beer, Red Wine, White Wine, Champagne Fruits - Grapefruit, [place], Orange, [employer] - Cranberry, Grapefruit, Orange, Pineapple Vegetables - Tomato & Tomato Products Flavor Enhancers - Hot peppers, Spicy foods, Chili, Horseradish, Vinegar, Monosodium glutamate (MSG) Artificial Sweeteners - NutraSweet, Sweet 'N Low, Equal (sweetener), Saccharin Ethnic foods - [place], [place], [place] food Express Scripts - low-fat & whole Fruits - Bananas, Blueberries, Honeydew melon, Pears, Raisins, Watermelon Vegetables - Broccoli, Brussels Sprouts, [place], Carrots, Cauliflower, Pocola, Cucumber, Mushrooms, Peas, Radishes, Squash, Zucchini, White potatoes, Sweet potatoes & yams Poultry - Chicken, Eggs, [place], [employer] - Beef, Programmer, multimedia, Lamb Seafood - Shrimp, [place] fish, Salmon Grains - Oat, Rice Snacks - Pretzels, Popcorn  *Lissa Morales et al. Diet and its role in interstitial cystitis/bladder pain syndrome (IC/BPS) and comorbid conditions. [place] 2012 Jan 11.     We discussed the symptoms of overactive bladder (OAB), which include urinary urgency, urinary frequency, night-time urination, with or without urge incontinence.  We discussed management including behavioral therapy (decreasing bladder irritants by following a bladder diet, urge suppression strategies, timed voids, bladder retraining), physical therapy, medication; and for refractory cases posterior tibial nerve stimulation, sacral neuromodulation, and intravesical botulinum toxin injection.   For Beta-3 agonist medication, we discussed the potential  side effect of elevated blood pressure which is more likely to occur in individuals with uncontrolled hypertension. Myrbetriq 25mg  daily.   It can take a month to start working so give it time, but if you have bothersome side effects call sooner and we can try a different medication.  Call us if you have trouble filling the prescription or if it's not covered by your insurance.   For treatment of stress urinary incontinence, which is leakage with physical activity/movement/strainging/coughing, we discussed expectant management versus nonsurgical options versus surgery. Nonsurgical options include weight loss, physical therapy, as well as a pessary.

## 2021-03-28 ENCOUNTER — Observation Stay (HOSPITAL_COMMUNITY)
Admission: EM | Admit: 2021-03-28 | Discharge: 2021-03-28 | Disposition: A | Discharge status: Home / Self Care | Payer: Medicare HMO | Attending: Internal Medicine | Admitting: Internal Medicine

## 2021-03-28 ENCOUNTER — Encounter (HOSPITAL_COMMUNITY): Payer: Self-pay

## 2021-03-28 ENCOUNTER — Emergency Department (HOSPITAL_COMMUNITY): Payer: Medicare HMO

## 2021-03-28 ENCOUNTER — Observation Stay (HOSPITAL_BASED_OUTPATIENT_CLINIC_OR_DEPARTMENT_OTHER): Payer: Medicare HMO

## 2021-03-28 DIAGNOSIS — Z20822 Contact with and (suspected) exposure to covid-19: Secondary | ICD-10-CM | POA: Diagnosis not present

## 2021-03-28 DIAGNOSIS — I6389 Other cerebral infarction: Secondary | ICD-10-CM

## 2021-03-28 DIAGNOSIS — M0600 Rheumatoid arthritis without rheumatoid factor, unspecified site: Secondary | ICD-10-CM | POA: Diagnosis present

## 2021-03-28 DIAGNOSIS — I129 Hypertensive chronic kidney disease with stage 1 through stage 4 chronic kidney disease, or unspecified chronic kidney disease: Secondary | ICD-10-CM | POA: Diagnosis not present

## 2021-03-28 DIAGNOSIS — J019 Acute sinusitis, unspecified: Secondary | ICD-10-CM | POA: Diagnosis not present

## 2021-03-28 DIAGNOSIS — E1122 Type 2 diabetes mellitus with diabetic chronic kidney disease: Secondary | ICD-10-CM | POA: Insufficient documentation

## 2021-03-28 DIAGNOSIS — G459 Transient cerebral ischemic attack, unspecified: Secondary | ICD-10-CM

## 2021-03-28 DIAGNOSIS — E119 Type 2 diabetes mellitus without complications: Secondary | ICD-10-CM

## 2021-03-28 DIAGNOSIS — J984 Other disorders of lung: Secondary | ICD-10-CM | POA: Diagnosis not present

## 2021-03-28 DIAGNOSIS — Z8673 Personal history of transient ischemic attack (TIA), and cerebral infarction without residual deficits: Secondary | ICD-10-CM | POA: Diagnosis not present

## 2021-03-28 DIAGNOSIS — Z79899 Other long term (current) drug therapy: Secondary | ICD-10-CM | POA: Diagnosis not present

## 2021-03-28 DIAGNOSIS — I2510 Atherosclerotic heart disease of native coronary artery without angina pectoris: Secondary | ICD-10-CM | POA: Insufficient documentation

## 2021-03-28 DIAGNOSIS — R5383 Other fatigue: Secondary | ICD-10-CM | POA: Diagnosis not present

## 2021-03-28 DIAGNOSIS — N189 Chronic kidney disease, unspecified: Secondary | ICD-10-CM | POA: Diagnosis not present

## 2021-03-28 DIAGNOSIS — E785 Hyperlipidemia, unspecified: Secondary | ICD-10-CM | POA: Diagnosis present

## 2021-03-28 DIAGNOSIS — M069 Rheumatoid arthritis, unspecified: Secondary | ICD-10-CM | POA: Diagnosis present

## 2021-03-28 DIAGNOSIS — I639 Cerebral infarction, unspecified: Secondary | ICD-10-CM | POA: Diagnosis not present

## 2021-03-28 DIAGNOSIS — Z87891 Personal history of nicotine dependence: Secondary | ICD-10-CM | POA: Diagnosis not present

## 2021-03-28 DIAGNOSIS — R479 Unspecified speech disturbances: Secondary | ICD-10-CM | POA: Diagnosis not present

## 2021-03-28 DIAGNOSIS — I10 Essential (primary) hypertension: Secondary | ICD-10-CM | POA: Diagnosis present

## 2021-03-28 DIAGNOSIS — R4781 Slurred speech: Secondary | ICD-10-CM | POA: Diagnosis not present

## 2021-03-28 DIAGNOSIS — G319 Degenerative disease of nervous system, unspecified: Secondary | ICD-10-CM | POA: Diagnosis not present

## 2021-03-28 DIAGNOSIS — R4182 Altered mental status, unspecified: Secondary | ICD-10-CM | POA: Diagnosis not present

## 2021-03-28 LAB — CBC WITH DIFFERENTIAL/PLATELET
Abs Immature Granulocytes: 0.02 10*3/uL (ref 0.00–0.07)
Basophils Absolute: 0 10*3/uL (ref 0.0–0.1)
Basophils Relative: 0 %
Eosinophils Absolute: 0.2 10*3/uL (ref 0.0–0.5)
Eosinophils Relative: 3 %
HCT: 41.5 % (ref 36.0–46.0)
Hemoglobin: 13.7 g/dL (ref 12.0–15.0)
Immature Granulocytes: 0 %
Lymphocytes Relative: 44 %
Lymphs Abs: 2.7 10*3/uL (ref 0.7–4.0)
MCH: 32.2 pg (ref 26.0–34.0)
MCHC: 33 g/dL (ref 30.0–36.0)
MCV: 97.4 fL (ref 80.0–100.0)
Monocytes Absolute: 0.7 10*3/uL (ref 0.1–1.0)
Monocytes Relative: 11 %
Neutro Abs: 2.6 10*3/uL (ref 1.7–7.7)
Neutrophils Relative %: 42 %
Platelets: 162 10*3/uL (ref 150–400)
RBC: 4.26 MIL/uL (ref 3.87–5.11)
RDW: 13 % (ref 11.5–15.5)
WBC: 6.3 10*3/uL (ref 4.0–10.5)
nRBC: 0 % (ref 0.0–0.2)

## 2021-03-28 LAB — ETHANOL: Alcohol, Ethyl (B): <10 mg/dL (ref <10)

## 2021-03-28 LAB — URINALYSIS, ROUTINE W REFLEX MICROSCOPIC
Bilirubin Urine: NEGATIVE
Glucose, UA: NEGATIVE mg/dL
Hgb urine dipstick: NEGATIVE
Ketones, ur: NEGATIVE mg/dL
Leukocytes,Ua: NEGATIVE
Nitrite: NEGATIVE
Protein, ur: NEGATIVE mg/dL
Specific Gravity, Urine: 1.009 (ref 1.005–1.030)
pH: 5 (ref 5.0–8.0)

## 2021-03-28 LAB — COMPREHENSIVE METABOLIC PANEL
ALT: 20 U/L (ref 0–44)
AST: 22 U/L (ref 15–41)
Albumin: 4.3 g/dL (ref 3.5–5.0)
Alkaline Phosphatase: 57 U/L (ref 38–126)
Anion gap: 11 (ref 5–15)
BUN: 28 mg/dL — ABNORMAL HIGH (ref 8–23)
CO2: 21 mmol/L — ABNORMAL LOW (ref 22–32)
Calcium: 9.5 mg/dL (ref 8.9–10.3)
Chloride: 106 mmol/L (ref 98–111)
Creatinine, Ser: 1.19 mg/dL — ABNORMAL HIGH (ref 0.44–1.00)
GFR, Estimated: 46 mL/min — ABNORMAL LOW (ref >60)
Glucose, Bld: 142 mg/dL — ABNORMAL HIGH (ref 70–99)
Potassium: 4 mmol/L (ref 3.5–5.1)
Sodium: 138 mmol/L (ref 135–145)
Total Bilirubin: 1.5 mg/dL — ABNORMAL HIGH (ref 0.3–1.2)
Total Protein: 7.1 g/dL (ref 6.5–8.1)

## 2021-03-28 LAB — RESP PANEL BY RT-PCR (FLU A&B, COVID) ARPGX2
Influenza A by PCR: NEGATIVE
Influenza B by PCR: NEGATIVE
SARS Coronavirus 2 by RT PCR: NEGATIVE

## 2021-03-28 LAB — RAPID URINE DRUG SCREEN, HOSP PERFORMED
Amphetamines: NOT DETECTED
Barbiturates: NOT DETECTED
Benzodiazepines: NOT DETECTED
Cocaine: NOT DETECTED
Opiates: NOT DETECTED
Tetrahydrocannabinol: NOT DETECTED

## 2021-03-28 LAB — PROTIME-INR
INR: 1 (ref 0.8–1.2)
Prothrombin Time: 12.6 seconds (ref 11.4–15.2)

## 2021-03-28 LAB — CBG MONITORING, ED
Glucose-Capillary: 141 mg/dL — ABNORMAL HIGH (ref 70–99)
Glucose-Capillary: 92 mg/dL (ref 70–99)

## 2021-03-28 LAB — APTT: aPTT: 31 seconds (ref 24–36)

## 2021-03-28 MED ORDER — SENNOSIDES-DOCUSATE SODIUM 8.6-50 MG PO TABS: 1.0000 | ORAL_TABLET | Freq: Every evening | ORAL | Status: DC | PRN

## 2021-03-28 MED ORDER — PRAMIPEXOLE DIHYDROCHLORIDE 0.25 MG PO TABS: 0.2500 mg | ORAL_TABLET | Freq: Every day | ORAL | Status: DC

## 2021-03-28 MED ORDER — FUROSEMIDE 20 MG PO TABS: 160.0000 mg | ORAL_TABLET | Freq: Every day | ORAL | Status: DC

## 2021-03-28 MED ORDER — ACETAMINOPHEN 325 MG PO TABS: 650.0000 mg | ORAL_TABLET | ORAL | Status: DC | PRN

## 2021-03-28 MED ORDER — POTASSIUM CHLORIDE CRYS ER 20 MEQ PO TBCR: 20.0000 meq | EXTENDED_RELEASE_TABLET | Freq: Every day | ORAL | Status: DC

## 2021-03-28 MED ORDER — CLOPIDOGREL BISULFATE 75 MG PO TABS: 75.0000 mg | ORAL_TABLET | ORAL | Status: DC

## 2021-03-28 MED ORDER — INSULIN ASPART 100 UNIT/ML ~~LOC~~ SOLN: 0.0000 [IU] | Freq: Three times a day (TID) | SUBCUTANEOUS | Status: DC

## 2021-03-28 MED ORDER — FAMOTIDINE 20 MG PO TABS: 20.0000 mg | ORAL_TABLET | Freq: Two times a day (BID) | ORAL | Status: DC

## 2021-03-28 MED ORDER — MIRABEGRON ER 25 MG PO TB24
25.0000 mg | ORAL_TABLET | Freq: Every day | ORAL | Status: DC
  Filled 2021-03-28: qty 1

## 2021-03-28 MED ORDER — ACETAMINOPHEN 160 MG/5ML PO SOLN: 650.0000 mg | ORAL | Status: DC | PRN

## 2021-03-28 MED ORDER — STROKE: EARLY STAGES OF RECOVERY BOOK: Freq: Once | Status: DC

## 2021-03-28 MED ORDER — LORATADINE 10 MG PO TABS: 10.0000 mg | ORAL_TABLET | Freq: Every day | ORAL | Status: DC

## 2021-03-28 MED ORDER — LOSARTAN POTASSIUM 50 MG PO TABS: 100.0000 mg | ORAL_TABLET | Freq: Every day | ORAL | Status: DC

## 2021-03-28 MED ORDER — SODIUM CHLORIDE 0.9 % IV SOLN: INTRAVENOUS | Status: DC

## 2021-03-28 MED ORDER — ENOXAPARIN SODIUM 40 MG/0.4ML ~~LOC~~ SOLN
40.0000 mg | SUBCUTANEOUS | Status: DC
  Filled 2021-03-28: qty 0.4

## 2021-03-28 MED ORDER — ACETAMINOPHEN 650 MG RE SUPP: 650.0000 mg | RECTAL | Status: DC | PRN

## 2021-03-28 MED ORDER — VALACYCLOVIR HCL 500 MG PO TABS: 1000.0000 mg | ORAL_TABLET | Freq: Every day | ORAL | Status: DC

## 2021-03-28 MED ORDER — SERTRALINE HCL 25 MG PO TABS: 25.0000 mg | ORAL_TABLET | Freq: Every day | ORAL | Status: DC

## 2021-03-28 MED ORDER — ACETAMINOPHEN ER 650 MG PO TBCR: 650.0000 mg | EXTENDED_RELEASE_TABLET | Freq: Three times a day (TID) | ORAL | Status: DC | PRN

## 2021-03-28 NOTE — ED Notes (Signed)
Pt in MRI.

## 2021-03-28 NOTE — Consult Note (Signed)
Neurology Consultation  Reason for Consult: stroke on [facility] Referring Physician: Coralyn Helling, EDP  CC: possible stroke  History is obtained from: patient and chart  HPI: Cheryl Wood is a 85 y.o. female who appears younger than stated age. PMHx includes, remote stroke in 2006 without sequelae, HTN with retinopathy, glaucoma, HLD, DM II, MVR, AVI, RA, TIA (2014), and urinary urgency/overactive bladder with incontinence. She states she has been on Plavix since 2006. Thinks she was on ASA for awhile, but does not take now. There are conflicting doses of Plavix in chart. She has not seen a neurologist in years.   Per patient, she has been feeling "washed out" and weak all over for about 3 days. No fever, chills, n/v, HA, weakness of single extremity, diarrhea, SOB, numbness or tingling, or clumsiness.   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 She felt like her speech was slowed. No dysphagia, aphasia, facial droop, diplopia, or acute change in vision. She does admit to "fuzzy" vision x 3 days. She is chronically HOH and is without her hearing aides today.   She has no history of seizures. Used to smoke but quit 40 years ago. Does not drink ETOH or use illicit drugs.   Per review of chart, there are recent visits to ophthamology, rheumatology , and PCP. NP reviewed note in 2014 for hospital admission for TIA. There is a visit to Dr. Leonie Man after hospital stay in with a diagnosis of transient global amnesia.   Neurology asked to consult for possible stroke.   LKW: difficult to say given her symptoms for past 3 days, but with acute change at 0900 today.  tpa given?: no, outside window.   ROS: A 14 point ROS was performed and is negative except as noted in the HPI.   Past Medical History:  Diagnosis Date  . Allergy   .  Arthritis   . Bleeding of blood vessel    ext. genitalia area  . Blood in stool   . Chronic kidney disease   . Depression   . Glaucoma   . Heart murmur   . History of chicken pox   . History of recurrent UTIs   . History of stomach ulcers   . Hyperlipidemia   . Hypertension   . Memory loss   . Rheumatic fever   . Seizures ([place])   . Stroke ([place])   . TIA (transient ischemic attack) 10/22/2013  . Urine incontinence      Family History  Problem Relation Age of Onset  . Cancer Mother   . Early death Mother   . Heart disease Father   . Arthritis Father   . Early death Father   . Hearing loss Father   . Hypertension Father   . Hyperlipidemia Father   . Cancer Sister   . Arthritis Sister   . Arthritis Brother   . Diabetes Brother   . Heart attack Brother   . Heart disease Brother   . Hyperlipidemia Brother   . Hypertension Brother   . Diabetes Daughter   . Heart disease Daughter   . Hyperlipidemia Daughter   . Hyperlipidemia Maternal Grandmother   . Hypertension Maternal Grandmother   . Stroke Maternal Grandmother   . Alcohol abuse Maternal Grandfather   . Early death Maternal Grandfather  tuberculosis  . Arthritis Paternal Grandmother   . Alcohol abuse Paternal Grandfather   . Heart disease Paternal Grandfather   . Hyperlipidemia Paternal Grandfather   . Hypertension Paternal Grandfather   . Stroke Paternal Grandfather   . Alcohol abuse Sister   . COPD Sister   . Drug abuse Sister   . Early death Sister   . Heart disease Sister   . Hypertension Sister   . Hyperlipidemia Sister   . Early death Brother   . Early death Brother    Social History:   reports that she quit smoking about 49 years ago. Her smoking use included cigarettes. She quit after 40.00 years of use. She has never used smokeless tobacco. She reports that she does not drink alcohol and does not use drugs.  Medications No current facility-administered medications for this  encounter.  Current Outpatient Medications:  .  acetaminophen (TYLENOL) 650 MG CR tablet, Take 650 mg by mouth every 8 (eight) hours as needed for pain., Disp: , Rfl:  .  b complex vitamins capsule, Take 1 capsule by mouth daily., Disp: , Rfl:  .  brimonidine (ALPHAGAN) 0.2 % ophthalmic solution, Place 1 drop into both eyes 2 (two) times daily., Disp: , Rfl:  .  clopidogrel (PLAVIX) 75 MG tablet, Take 1 tablet (75 mg total) by mouth every other day., Disp: 90 tablet, Rfl: 3 .  famciclovir (FAMVIR) 500 MG tablet, Take 500 mg by mouth daily. , Disp: , Rfl:  .  famotidine (PEPCID) 20 MG tablet, TAKE ONE TABLET BY MOUTH TWICE A DAY (Patient taking differently: Take 20 mg by mouth 2 (two) times daily.), Disp: 180 tablet, Rfl: 2 .  furosemide (LASIX) 80 MG tablet, TAKE TWO TABLETS BY MOUTH DAILY (Patient taking differently: Take 160 mg by mouth daily.), Disp: 180 tablet, Rfl: 2 .  inFLIXimab (REMICADE) 100 MG injection, Inject 100 mg into the vein every 8 (eight) weeks., Disp: , Rfl:  .  KLOR-CON M20 20 MEQ tablet, TAKE ONE TABLET BY MOUTH DAILY (Patient taking differently: Take 20 mEq by mouth daily.), Disp: 90 tablet, Rfl: 2 .  loratadine (CLARITIN) 10 MG tablet, Take 10 mg by mouth daily., Disp: , Rfl:  .  losartan (COZAAR) 100 MG tablet, TAKE ONE TABLET BY MOUTH DAILY (Patient taking differently: Take 100 mg by mouth daily.), Disp: 90 tablet, Rfl: 3 .  lovastatin (MEVACOR) 20 MG tablet, TAKE ONE TABLET BY MOUTH EVERY EVENING (Patient taking differently: Take 20 mg by mouth at bedtime.), Disp: 90 tablet, Rfl: 3 .  mirabegron ER (MYRBETRIQ) 25 MG TB24 tablet, Take 1 tablet (25 mg total) by mouth daily., Disp: 30 tablet, Rfl: 5 .  Multiple Vitamins-Minerals (CENTRUM SILVER 50+WOMEN PO), Take 1 tablet by mouth daily., Disp: , Rfl:  .  pramipexole (MIRAPEX) 0.25 MG tablet, TAKE ONE TABLET BY MOUTH EVERY NIGHT AT BEDTIME (Patient taking differently: Take 0.25 mg by mouth at bedtime.), Disp: 90 tablet, Rfl:  1 .  prednisoLONE acetate (PRED FORTE) 1 % ophthalmic suspension, Place 1 drop into the right eye 4 (four) times daily., Disp: , Rfl:  .  sertraline (ZOLOFT) 25 MG tablet, TAKE ONE TABLET BY MOUTH DAILY (Patient taking differently: Take 25 mg by mouth at bedtime.), Disp: 90 tablet, Rfl: 3  Exam: Current vital signs: BP (!) 117/94   Pulse 65   Temp 97.9 F (36.6 C) (Oral)   Resp 14   LMP  (LMP Unknown)   SpO2 97%  Vital signs in last  24 hours: Temp:  [97.9 F (36.6 C)] 97.9 F (36.6 C) (04/01 0936) Pulse Rate:  [63-78] 65 (04/01 1145) Resp:  [10-16] 14 (04/01 1145) BP: (117-164)/(66-98) 117/94 (04/01 1145) SpO2:  [96 %-97 %] 97 % (04/01 1145)  GENERAL: Awake, alert in NAD HEENT: - Normocephalic and atraumatic moist mucous membranes, no LN++, no thyromegaly LUNGS - Normal respiratory effort.  CV - RRR on tele ABDOMEN - Soft, nontender Ext: warm, well perfused Psych: Affect is light.   NEURO:  Mental Status: AA&Ox3  Speech/Language: speech is without dysarthria or aphasia. Naming, repetition, fluency, and comprehension intact. Cranial Nerves:  II: PERRL 32mm/brisk. visual fields full.  III, IV, VI: EOMI. Lid elevation symmetric and full.  V: sensation is intact and symmetrical to face. Blinks to threat. Moves jaw back and forth.  VII: Smile is symmetrical. Able to puff cheeks and raise eyebrows.  VIII:hearing intact to voice but she is HOH.  IX, X: palate elevation is symmetric. Phonation normal.  XI: normal sternocleidomastoid and trapezius muscle strength XFG:HWEXHB is symmetrical without fasciculations.   Motor: 5/5 strength to grips, right biceps and triceps, and plantar flexion and extension on left.  Right biceps and triceps 4/5. Right plantar flexion and extension is 4+/5.  Bulk is normal. Tone is normal Sensation- Intact to light touch bilaterally in all four extremities. Extinction absent to light touch to DSS. Vibration sense is normal to extremities.    Coordination: FTN intact bilaterally. HKS intact bilaterally. No pronator drift.  DTRs: 2+ throughout.  Gait- deferred  1a Level of Conscious: 0 1b LOC Questions: 0 1c LOC Commands:0  2 Best Gaze: 0 3 Visual: 0 4 Facial Palsy: 0 5a Motor Arm - left: 0 5b Motor Arm - Right: 0 6a Motor Leg - Left: 0 6b Motor Leg - Right: 0 7 Limb Ataxia: 0 8 Sensory: 0 9 Best Language: 0 10 Dysarthria: 0 11 Extinct. and Inatten: 0 TOTAL: 0   Labs I have reviewed labs in epic and the results pertinent to this consultation are: Creat 1.19, PT 12.6/1, aPTT 31  CBC    Component Value Date/Time   WBC 6.3 03/28/2021 0950   RBC 4.26 03/28/2021 0950   HGB 13.7 03/28/2021 0950   HCT 41.5 03/28/2021 0950   PLT 162 03/28/2021 0950   MCV 97.4 03/28/2021 0950   MCH 32.2 03/28/2021 0950   MCHC 33.0 03/28/2021 0950   RDW 13.0 03/28/2021 0950   LYMPHSABS 2.7 03/28/2021 0950   MONOABS 0.7 03/28/2021 0950   EOSABS 0.2 03/28/2021 0950   BASOSABS 0.0 03/28/2021 0950    CMP     Component Value Date/Time   NA 138 03/28/2021 0950   NA 144 11/29/2019 0000   K 4.0 03/28/2021 0950   CL 106 03/28/2021 0950   CO2 21 (L) 03/28/2021 0950   GLUCOSE 142 (H) 03/28/2021 0950   BUN 28 (H) 03/28/2021 0950   BUN 21 11/29/2019 0000   CREATININE 1.19 (H) 03/28/2021 0950   CREATININE 1.07 (H) 10/03/2020 0854   CALCIUM 9.5 03/28/2021 0950   PROT 7.1 03/28/2021 0950   ALBUMIN 4.3 03/28/2021 0950   AST 22 03/28/2021 0950   ALT 20 03/28/2021 0950   ALKPHOS 57 03/28/2021 0950   BILITOT 1.5 (H) 03/28/2021 0950   GFRNONAA 46 (L) 03/28/2021 0950   GFRNONAA 48 (L) 10/03/2020 0854   GFRAA 56 (L) 10/03/2020 0854    Lipid Panel     Component Value Date/Time   CHOL 205 (H) 07/03/2020  0908   TRIG 118.0 07/03/2020 0908   HDL 49.50 07/03/2020 0908   CHOLHDL 4 07/03/2020 0908   VLDL 23.6 07/03/2020 0908   LDLCALC 132 (H) 07/03/2020 0908   LDLDIRECT 136.0 01/25/2019 1149     Imaging  CT head 1. Suggestion  of potential recent infarct in the posterior left frontal lobe near the frontal-temporal junction. Nearby prior infarct in this area. No other findings elsewhere suggesting potential more recent infarct. 2.  Periventricular small vessel disease otherwise appears stable. 3. Prior small infarct in the periphery of the left lateral cerebellum. 4.  No evident mass or hemorrhage. 5.  Foci of arterial vascular calcification. 6.  Foci of paranasal sinus disease.  MRI brain  No evidence of acute intracranial abnormality, including acute Infarction. Redemonstrated chronic small-vessel infarcts within the left corona radiata and left basal ganglia. Background mild generalized parenchymal atrophy and chronic small vessel ischemic disease, slightly progressed from the brain MRI of 10/22/2013. Redemonstrated small chronic infarcts within the left cerebellar hemisphere.    Assessment: 85 yo female with a PMHx of stroke and TIA who presents today with a feeling of having a stroke at 0900 hrs. Was not a code stroke due to [place]. No tPA due to being outside the window. CTH suggestive of potential recent infarct in the posterior left frontal lobe. She had a 3 day history of not feeling like herself with generalized weakness and feeling fuzzy. Stroke risk factors include HTN, HLD, prior stroke, and DM II. At present, we are waiting for the MRI brain results.   Upon further assessment in ED, she states she feels much better than this a.m.   Impression: 1. r/o stroke. No stroke on MRI.  2. Generalized weakness, improved since here in ED.   Plan: Discussed with hospitalist and EDP. No need to admit patient for stroke workup unless medicine has some reason to admit.   NP asked patient to call her PCP and make an appt for early next weak due to her generalized weakness and not feeling well for days. Should she worsen or have further concerns, return to ED.   Pt seen by Clance Boll, NP/Neuro and  later by MD. Note/plan to be edited by MD as needed.  Pager: [phone]   I reviewed the plan and agree with it. I did not see the patient. MRI Brain which was obtained to clarify concerns for stroke on [name], was negative for a stroke. Patient is reporting generalized weakness and not feeling well for days.  [place] Pager Number [phone]

## 2021-03-28 NOTE — ED Provider Notes (Signed)
6:23 PM.  According to documentation, patient's plan was initially be admitted for TIA or stroke work-up.  She had MRI that did not show stroke.  The neurology team and hospitalist team together felt that she was actually safer discharge home instead of admission given her resolution of symptoms.  I reassessed the patient and she tells me she is at her baseline and agrees with plan for discharge.  She will follow-up with her PCP in 7 to 10 days.  She understands extremely strict return precautions and follow-up instructions and had no other questions or concerns.  Patient was discharged with family in good condition with no complaints.  Clinical Impression: 1. Difficulty with speech     Disposition: Discharge  Condition: Good  I have discussed the results, Dx and Tx plan with the pt(& family if present). He/she/they expressed understanding and agree(s) with the plan. Discharge instructions discussed at great length. Strict return precautions discussed and pt &/or family have verbalized understanding of the instructions. No further questions at time of discharge.    New Prescriptions   No medications on file    Follow Up: Orma Flaming, MD [place] [address] [place] EMERGENCY DEPARTMENT [address] [id number] mc [place] [phone]       Acie Custis, Gwenyth Allegra, MD 03/28/21 [phone]

## 2021-03-28 NOTE — ED Provider Notes (Signed)
[facility] EMERGENCY DEPARTMENT Provider Note   CSN: [id number] Arrival date & time: 03/28/21  6440     History Chief Complaint  Patient presents with  . stroke like s/s    Cheryl Wood is a 85 y.o. female with PMHx HTN, HLD, TIA, RA who presents to the ED today via personal vehicle with daughter with complaints of generalized fatigue for the past 2 days with slurred speech that was noticed while pt was at work this morning around 9-9:05 AM. Daughter reports that pt has felt generally unwell for the past 2 days however decided to go to her part time job this morning. Daughter spoke with her on the phone last night around 9 PM and states her speech was fine at that time. When she spoke with pt on the phone this morning after co-workers noticed pt seemed off she reports her speech sounded like "marbles." Daughter reports history of stroke in the past and states that pt presented similarly at that time. Daughter reports that in the ED her speech seems slowed however she cannot tell if it sounds slurred at this time given the slowness of pt's speech.   The history is provided by the patient, a relative and medical records.       Past Medical History:  Diagnosis Date  . Allergy   . Arthritis   . Bleeding of blood vessel    ext. genitalia area  . Blood in stool   . Chronic kidney disease   . Depression   . Glaucoma   . Heart murmur   . History of chicken pox   . History of recurrent UTIs   . History of stomach ulcers   . Hyperlipidemia   . Hypertension   . Memory loss   . Rheumatic fever   . Seizures ([place])   . Stroke ([place])   . TIA (transient ischemic attack) 10/22/2013  . Urine incontinence     Patient Active Problem List   Diagnosis Date Noted  . Rheumatoid arthritis ([place]) 01/03/2021  . Depression, major, single episode, mild ([place]) 09/29/2019  . Polyarthralgia 04/21/2019  . Diabetes mellitus without complication ([place]) 34/74/2595  . CVA (cerebral  vascular accident) ([place]) 01/25/2019  . RLS (restless legs syndrome) 01/25/2019  . Glaucoma, left eye 01/25/2019  . Glaucoma associated with ocular inflammation, left, 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  . Nonrheumatic aortic valve insufficiency   . CAD in native artery   . HTN (hypertension) 10/22/2013  . Hyperlipidemia 10/22/2013    Past Surgical History:  Procedure Laterality Date  . ABDOMINAL HYSTERECTOMY    . APPENDECTOMY  1983  . CATARACT EXTRACTION Right May 13, 2014  . EYE SURGERY  12/13/2018   stent inserted in left eye  . gum transplant    . RIGHT/LEFT HEART CATH AND CORONARY ANGIOGRAPHY N/A 05/12/2018   Procedure: RIGHT/LEFT HEART CATH AND CORONARY ANGIOGRAPHY;  Surgeon: Belva Crome, MD;  Location: [facility];  Service: Cardiovascular;  Laterality: N/A;  . THORACIC AORTOGRAM N/A 05/12/2018   Procedure: THORACIC AORTOGRAM;  Surgeon: Belva Crome, MD;  Location: [facility];  Service: Cardiovascular;  Laterality: N/A;  . TONSILLECTOMY       OB History    Gravida  4   Para      Term      Preterm      AB  2   Living  2     SAB      IAB  Ectopic      Multiple      Live Births              Family History  Problem Relation Age of Onset  . Cancer Mother   . Early death Mother   . Heart disease Father   . Arthritis Father   . Early death Father   . Hearing loss Father   . Hypertension Father   . Hyperlipidemia Father   . Cancer Sister   . Arthritis Sister   . Arthritis Brother   . Diabetes Brother   . Heart attack Brother   . Heart disease Brother   . Hyperlipidemia Brother   . Hypertension Brother   . Diabetes Daughter   . Heart disease Daughter   . Hyperlipidemia Daughter   . Hyperlipidemia Maternal Grandmother   . Hypertension Maternal Grandmother   . Stroke Maternal Grandmother   . Alcohol abuse Maternal Grandfather   . Early death Maternal Grandfather        tuberculosis  . Arthritis Paternal Grandmother   . Alcohol  abuse Paternal Grandfather   . Heart disease Paternal Grandfather   . Hyperlipidemia Paternal Grandfather   . Hypertension Paternal Grandfather   . Stroke Paternal Grandfather   . Alcohol abuse Sister   . COPD Sister   . Drug abuse Sister   . Early death Sister   . Heart disease Sister   . Hypertension Sister   . Hyperlipidemia Sister   . Early death Brother   . Early death Brother     Social History   Tobacco Use  . Smoking status: Former Smoker    Years: 40.00    Types: Cigarettes    Quit date: 11/05/1971    Years since quitting: 49.4  . Smokeless tobacco: Never Used  Vaping Use  . Vaping Use: Never used  Substance Use Topics  . Alcohol use: No    Alcohol/week: 0.0 standard drinks  . Drug use: No    Home Medications Prior to Admission medications   Medication Sig Start Date End Date Taking? Authorizing Provider  acetaminophen (TYLENOL) 650 MG CR tablet Take 650 mg by mouth every 8 (eight) hours as needed for pain.   Yes [provider]  b complex vitamins capsule Take 1 capsule by mouth daily.   Yes [provider]  brimonidine (ALPHAGAN) 0.2 % ophthalmic solution Place 1 drop into both eyes 2 (two) times daily.   Yes [provider]  clopidogrel (PLAVIX) 75 MG tablet Take 1 tablet (75 mg total) by mouth every other day. 08/05/20  Yes Orma Flaming, MD  famciclovir (FAMVIR) 500 MG tablet Take 500 mg by mouth daily.  01/24/19  Yes [provider]  famotidine (PEPCID) 20 MG tablet TAKE ONE TABLET BY MOUTH TWICE A DAY Patient taking differently: Take 20 mg by mouth 2 (two) times daily. 07/16/20  Yes Leamon Arnt, MD  furosemide (LASIX) 80 MG tablet TAKE TWO TABLETS BY MOUTH DAILY Patient taking differently: Take 160 mg by mouth daily. 10/03/20  Yes Orma Flaming, MD  inFLIXimab (REMICADE) 100 MG injection Inject 100 mg into the vein every 8 (eight) weeks.   Yes [provider]  KLOR-CON M20 20 MEQ tablet TAKE ONE TABLET BY MOUTH  DAILY Patient taking differently: Take 20 mEq by mouth daily. 01/09/21  Yes Orma Flaming, MD  loratadine (CLARITIN) 10 MG tablet Take 10 mg by mouth daily.   Yes [provider]  losartan (COZAAR) 100  MG tablet TAKE ONE TABLET BY MOUTH DAILY Patient taking differently: Take 100 mg by mouth daily. 10/17/20  Yes Orma Flaming, MD  lovastatin (MEVACOR) 20 MG tablet TAKE ONE TABLET BY MOUTH EVERY EVENING Patient taking differently: Take 20 mg by mouth at bedtime. 01/03/21  Yes Orma Flaming, MD  mirabegron ER (MYRBETRIQ) 25 MG TB24 tablet Take 1 tablet (25 mg total) by mouth daily. 03/26/21  Yes Jaquita Folds, MD  Multiple Vitamins-Minerals (CENTRUM SILVER 50+WOMEN PO) Take 1 tablet by mouth daily.   Yes [provider]  pramipexole (MIRAPEX) 0.25 MG tablet TAKE ONE TABLET BY MOUTH EVERY NIGHT AT BEDTIME Patient taking differently: Take 0.25 mg by mouth at bedtime. 02/10/21  Yes Orma Flaming, MD  prednisoLONE acetate (PRED FORTE) 1 % ophthalmic suspension Place 1 drop into the right eye 4 (four) times daily. 01/18/21  Yes [provider]  sertraline (ZOLOFT) 25 MG tablet TAKE ONE TABLET BY MOUTH DAILY Patient taking differently: Take 25 mg by mouth at bedtime. 11/22/20  Yes Orma Flaming, MD    Allergies    Dorzolamide hcl-timolol mal, Tetanus toxoids, and Hctz [hydrochlorothiazide]  Review of Systems   Review of Systems  Constitutional: Positive for fatigue.  Neurological: Positive for speech difficulty.  All other systems reviewed and are negative.   Physical Exam Updated Vital Signs BP (!) 164/98   Pulse 78   Temp 97.9 F (36.6 C) (Oral)   Resp 16   LMP  (LMP Unknown)   SpO2 97%   Physical Exam Vitals and nursing note reviewed.  Constitutional:      Appearance: She is ill-appearing.     Comments: Appears very fatigued. Following commands however requires a lot of encouragement.   HENT:     Head: Normocephalic and atraumatic.  Eyes:      Extraocular Movements: Extraocular movements intact.     Conjunctiva/sclera: Conjunctivae normal.     Pupils: Pupils are equal, round, and reactive to light.  Cardiovascular:     Rate and Rhythm: Normal rate and regular rhythm.     Pulses: Normal pulses.  Pulmonary:     Effort: Pulmonary effort is normal.     Breath sounds: Normal breath sounds. No wheezing, rhonchi or rales.  Abdominal:     Palpations: Abdomen is soft.     Tenderness: There is no abdominal tenderness. There is no guarding or rebound.  Musculoskeletal:     Cervical back: Neck supple.  Skin:    General: Skin is warm and dry.  Neurological:     Mental Status: She is alert.     Comments: Alert and oriented to self, place, time and event.   Speech is fluent and clear however slowed.    Strength equal in BUE and BLEs with generalized weakness throughout.  Sensation intact in upper/lower extremities   No pronator drift.  Normal finger-to-nose CN I not tested  CN II grossly intact visual fields bilaterally. Did not visualize posterior eye.   CN III, IV, VI PERRLA and EOMs intact bilaterally  CN V Intact sensation to sharp and light touch to the face  CN VII facial movements symmetric  CN VIII not tested  CN IX, X no uvula deviation, symmetric rise of soft palate  CN XI 5/5 SCM and trapezius strength bilaterally  CN XII Midline tongue protrusion, symmetric L/R movements      ED Results / Procedures / Treatments   Labs (all labs ordered are listed, but only abnormal results are displayed) Labs  Reviewed  COMPREHENSIVE METABOLIC PANEL - Abnormal; Notable for the following components:      Result Value   CO2 21 (*)    Glucose, Bld 142 (*)    BUN 28 (*)    Creatinine, Ser 1.19 (*)    Total Bilirubin 1.5 (*)    GFR, Estimated 46 (*)    All other components within normal limits  CBG MONITORING, ED - Abnormal; Notable for the following components:   Glucose-Capillary 141 (*)    All other components within normal  limits  RESP PANEL BY RT-PCR (FLU A&B, COVID) ARPGX2  CBC WITH DIFFERENTIAL/PLATELET  ETHANOL  PROTIME-INR  APTT  URINALYSIS, ROUTINE W REFLEX MICROSCOPIC  RAPID URINE DRUG SCREEN, HOSP PERFORMED    EKG EKG Interpretation  Date/Time:  Friday March 28 2021 09:44:48 EDT Ventricular Rate:  69 PR Interval:  188 QRS Duration: 87 QT Interval:  405 QTC Calculation: 434 R Axis:   3 Text Interpretation: Sinus rhythm Abnormal R-wave progression, early transition Confirmed by Nanda Quinton [phone]) on 03/28/2021 9:49:59 AM   Radiology CT Head Wo Contrast  Result Date: 03/28/2021 CLINICAL DATA:  Altered mental status EXAM: CT HEAD WITHOUT CONTRAST TECHNIQUE: Contiguous axial images were obtained from the base of the skull through the vertex without intravenous contrast. COMPARISON:  Head CT and brain MRI October 22, 2013 FINDINGS: Brain: Mild diffuse atrophy is stable. There is no intracranial mass, hemorrhage, extra-axial fluid collection, or midline shift. There is small vessel disease in the centra semiovale bilaterally, stable. Prior infarct in the inferior left centrum semiovale is again noted. There is subtle decreased attenuation at the gray-white junction of the left posterior frontal lobe at the level of the prior infarct in the left centrum semiovale which may represent a more recent focal infarct in this area, best appreciated on axial slice 19 series 3, sagittal slice 38 series 6, and coronal slices 30 and 31, series 5. There is evidence of a prior small infarct in the periphery of the left cerebellum laterally, stable. Vascular: No hyperdense vessel. There is calcification in each carotid siphon region. Skull: Bony calvarium appears intact. Sinuses/Orbits: There are small retention cysts in each maxillary antrum as well as mucosal thickening medially in each maxillary antrum. There is mucosal thickening and opacification in several ethmoid air cells. Orbits appear symmetric bilaterally except  for evidence of prior scleral banding on the left. Other: Mastoid air cells are clear. IMPRESSION: 1. Suggestion of potential recent infarct in the posterior left frontal lobe near the frontal-temporal junction. Nearby prior infarct in this area. No other findings elsewhere suggesting potential more recent infarct. 2.  Periventricular small vessel disease otherwise appears stable. 3. Prior small infarct in the periphery of the left lateral cerebellum. 4.  No evident mass or hemorrhage. 5.  Foci of arterial vascular calcification. 6.  Foci of paranasal sinus disease. Electronically Signed   By: Lowella Grip III M.D.   On: 03/28/2021 10:35   DG Chest Portable 1 View  Result Date: 03/28/2021 CLINICAL DATA:  Tiredness.  Rule out pneumonia EXAM: PORTABLE CHEST 1 VIEW COMPARISON:  03/10/2018 FINDINGS: Mild scarring at the bases, stable. No consolidation, edema, convincing effusion, or pneumothorax. Stable borderline heart size. Stable mild aortic tortuosity. IMPRESSION: Stable from prior.  No visible pneumonia. Electronically Signed   By: Monte Fantasia M.D.   On: 03/28/2021 10:55    Procedures Procedures   Medications Ordered in ED Medications - No data to display  ED Course  I have  reviewed the triage vital signs and the nursing notes.  Pertinent labs & imaging results that were available during my care of the patient were reviewed by me and considered in my medical decision making (see chart for details).    MDM Rules/Calculators/A&P                          85 year old female who presents to the ED today with concern for strokelike symptoms.  Coworkers noticed that her speech seemed off around 9 AM this morning.  Daughter last spoke with patient around 56 PM last night which is patient's last known normal.  She has been fatigued for the past 2 days without any specific symptoms.  On arrival to the ED patient is afebrile, nontachycardic and nontachypneic.  Blood pressure 164/98.  On exam she  appears very fatigued.  Her speech is slowed.  She needs a lot of encouragement however able to follow all commands with increased encouragement.  She appears generally weak throughout the exam.  There is no obvious focal neuro deficits at this time.  Again her speech is slowed however is clear without any aphasia or dysarthria.  Does appear she has a history of TIA in the past.  Currently on Plavix.  Exam not consistent with a stroke at this time.  Suspect that there is some sort of illness causing patient to feel this way with generalized fatigue.  Will work-up for same.  Will rule out UTI as well as Covid/flu.  Will obtain chest x-ray to assess for pneumonia.  Will obtain CT head given speech changes however will not call code stroke at this time given she is no longer in the window for tpA. Attending physician Dr. Laverta Baltimore has evaluated patient and agrees with plan.   CBG 141 CBC without leukocytosis and hgb stable at 13.7 PT INR and APTT within normal limits  CT Head: IMPRESSION:  1. Suggestion of potential recent infarct in the posterior left  frontal lobe near the frontal-temporal junction. Nearby prior  infarct in this area. No other findings elsewhere suggesting  potential more recent infarct.    2. Periventricular small vessel disease otherwise appears stable.    3. Prior small infarct in the periphery of the left lateral  cerebellum.    4. No evident mass or hemorrhage.    5. Foci of arterial vascular calcification.    6. Foci of paranasal sinus disease.   Consulted neurology given CT head findings, recommends MRI  Brain at this time. Will follow along pending MRI results.   Neuro NP came and evaluated patient; agrees with MRI and admission to hospital for TIA/Stroke work up.   Have discussed case with [facility] Dr. Linda Hedges who agrees to accept patient for admission. Appreciate his involvement. MRI has been ordered but not completed at this time.   This note was  prepared using Dragon voice recognition software and may include unintentional dictation errors due to the inherent limitations of voice recognition software.  Final Clinical Impression(s) / ED Diagnoses Final diagnoses:  Difficulty with speech    Rx / DC Orders ED Discharge Orders    None       Eustaquio Maize, PA-C 03/28/21 1504    Long, W[facility], MD 03/30/21 [phone]

## 2021-03-28 NOTE — ED Notes (Signed)
Pt resting comfortably in bed. Denies pain at this time Respirations are even and non-labored  Skin is warm, dry and intact   Call light within reach

## 2021-03-28 NOTE — Progress Notes (Signed)
  Echocardiogram 2D Echocardiogram has been performed.  Cheryl Wood 03/28/2021, 5:47 PM

## 2021-03-28 NOTE — Consult Note (Signed)
[facility] Medical Consultation  Cheryl Wood BMW:[id number] DOB: 24-Jan-1936 DOA: 03/28/2021 PCP: Orma Flaming, MD   Requesting physician: Ms. Alroy Bailiff Date of consultation: 03/28/21 Reason for consultation: TIA  Impression/Recommendations Active Problems:   TIA (transient ischemic attack)   HTN (hypertension)   Hyperlipidemia   Diabetes mellitus without complication ([place])   Rheumatoid arthritis ([place])    TIA - patient presented with h/o slurred speech, resolved by the time she arrived in ED,  in a setting of having had previous stroke several years ago. CT equivocal for possible new infarct. MRI brain negative for stroke. Patient cleared by Neurology for d/c home.  Recommend: d/c home, continue all present medications. See PCP 7-10 days.  Thank you for this consultation.  Chief Complaint: slurred speech  HPI:  Patient is a pleasant 85 y/o last seen by family 9PM 03/27/21. This AM she was noted to have slurred speech. Due to h/o CVA in the past she presented to [facility] ED for evaluation of possible TIA/CVA. She has otherwise been feeling well  Review of Systems:  Neuro - no headache, no focal weakness Cardio - no chest pain, palpations PUl - no SOB , increased WOB GI - no N/V/D, no abdominal pain MSK - stable RA w/o flare  Past Medical History:  Diagnosis Date  . Allergy   . Arthritis   . Bleeding of blood vessel    ext. genitalia area  . Blood in stool   . Chronic kidney disease   . Depression   . Glaucoma   . Heart murmur   . History of chicken pox   . History of recurrent UTIs   . History of stomach ulcers   . Hyperlipidemia   . Hypertension   . Memory loss   . Rheumatic fever   . Seizures ([place])   . Stroke ([place])   . TIA (transient ischemic attack) 10/22/2013  . Urine incontinence    Past Surgical History:  Procedure Laterality Date  . ABDOMINAL HYSTERECTOMY    . APPENDECTOMY  1983  . CATARACT EXTRACTION Right May 13, 2014  . EYE SURGERY  12/13/2018    stent inserted in left eye  . gum transplant    . RIGHT/LEFT HEART CATH AND CORONARY ANGIOGRAPHY N/A 05/12/2018   Procedure: RIGHT/LEFT HEART CATH AND CORONARY ANGIOGRAPHY;  Surgeon: Belva Crome, MD;  Location: [place] CV [facility];  Service: Cardiovascular;  Laterality: N/A;  . THORACIC AORTOGRAM N/A 05/12/2018   Procedure: THORACIC AORTOGRAM;  Surgeon: Belva Crome, MD;  Location: [place] CV [facility];  Service: Cardiovascular;  Laterality: N/A;  . TONSILLECTOMY      Soc Hx - widowed. Lives independently in an apartment. Family close at hand  Social History:  reports that she quit smoking about 49 years ago. Her smoking use included cigarettes. She quit after 40.00 years of use. She has never used smokeless tobacco. She reports that she does not drink alcohol and does not use drugs.  Allergies  Allergen Reactions  . Dorzolamide Hcl-Timolol Mal Other (See Comments)    Red eyes  . Tetanus Toxoids Swelling    Arm was twice the size it should be  . Hctz [Hydrochlorothiazide] Rash   Family History  Problem Relation Age of Onset  . Cancer Mother   . Early death Mother   . Heart disease Father   . Arthritis Father   . Early death Father   . Hearing loss Father   . Hypertension Father   . Hyperlipidemia  Father   . Cancer Sister   . Arthritis Sister   . Arthritis Brother   . Diabetes Brother   . Heart attack Brother   . Heart disease Brother   . Hyperlipidemia Brother   . Hypertension Brother   . Diabetes Daughter   . Heart disease Daughter   . Hyperlipidemia Daughter   . Hyperlipidemia Maternal Grandmother   . Hypertension Maternal Grandmother   . Stroke Maternal Grandmother   . Alcohol abuse Maternal Grandfather   . Early death Maternal Grandfather        tuberculosis  . Arthritis Paternal Grandmother   . Alcohol abuse Paternal Grandfather   . Heart disease Paternal Grandfather   . Hyperlipidemia Paternal Grandfather   . Hypertension Paternal Grandfather   . Stroke  Paternal Grandfather   . Alcohol abuse Sister   . COPD Sister   . Drug abuse Sister   . Early death Sister   . Heart disease Sister   . Hypertension Sister   . Hyperlipidemia Sister   . Early death Brother   . Early death Brother     Prior to Admission medications   Medication Sig Start Date End Date Taking? Authorizing Provider  acetaminophen (TYLENOL) 650 MG CR tablet Take 650 mg by mouth every 8 (eight) hours as needed for pain.   Yes [provider]  b complex vitamins capsule Take 1 capsule by mouth daily.   Yes [provider]  brimonidine (ALPHAGAN) 0.2 % ophthalmic solution Place 1 drop into both eyes 2 (two) times daily.   Yes [provider]  clopidogrel (PLAVIX) 75 MG tablet Take 1 tablet (75 mg total) by mouth every other day. 08/05/20  Yes Orma Flaming, MD  famciclovir (FAMVIR) 500 MG tablet Take 500 mg by mouth daily.  01/24/19  Yes [provider]  famotidine (PEPCID) 20 MG tablet TAKE ONE TABLET BY MOUTH TWICE A DAY Patient taking differently: Take 20 mg by mouth 2 (two) times daily. 07/16/20  Yes Leamon Arnt, MD  furosemide (LASIX) 80 MG tablet TAKE TWO TABLETS BY MOUTH DAILY Patient taking differently: Take 160 mg by mouth daily. 10/03/20  Yes Orma Flaming, MD  inFLIXimab (REMICADE) 100 MG injection Inject 100 mg into the vein every 8 (eight) weeks.   Yes [provider]  KLOR-CON M20 20 MEQ tablet TAKE ONE TABLET BY MOUTH DAILY Patient taking differently: Take 20 mEq by mouth daily. 01/09/21  Yes Orma Flaming, MD  loratadine (CLARITIN) 10 MG tablet Take 10 mg by mouth daily.   Yes [provider]  losartan (COZAAR) 100 MG tablet TAKE ONE TABLET BY MOUTH DAILY Patient taking differently: Take 100 mg by mouth daily. 10/17/20  Yes Orma Flaming, MD  lovastatin (MEVACOR) 20 MG tablet TAKE ONE TABLET BY MOUTH EVERY EVENING Patient taking differently: Take 20 mg by mouth at bedtime. 01/03/21  Yes Orma Flaming, MD   mirabegron ER (MYRBETRIQ) 25 MG TB24 tablet Take 1 tablet (25 mg total) by mouth daily. 03/26/21  Yes Jaquita Folds, MD  Multiple Vitamins-Minerals (CENTRUM SILVER 50+WOMEN PO) Take 1 tablet by mouth daily.   Yes [provider]  pramipexole (MIRAPEX) 0.25 MG tablet TAKE ONE TABLET BY MOUTH EVERY NIGHT AT BEDTIME Patient taking differently: Take 0.25 mg by mouth at bedtime. 02/10/21  Yes Orma Flaming, MD  prednisoLONE acetate (PRED FORTE) 1 % ophthalmic suspension Place 1 drop into the right eye 4 (four) times daily. 01/18/21  Yes [provider]  sertraline (ZOLOFT) 25 MG tablet TAKE ONE TABLET BY MOUTH DAILY Patient taking differently: Take 25 mg by mouth at bedtime. 11/22/20  Yes Orma Flaming, MD   Physical Exam: Blood pressure (!) 144/73, pulse (!) 55, temperature 97.9 F (36.6 C), temperature source Oral, resp. rate 15, SpO2 97 %. Vitals:   03/28/21 1430 03/28/21 1700  BP: 107/79 (!) 144/73  Pulse: (!) 59 (!) 55  Resp: 12 15  Temp:    SpO2: 96% 97%     General:  WNWD woam looking younger than her stated age  Eyes: C&S clear, PERRLA  ENT: nl dentition, moist mucus membranes  Neck: supple  Cardiovascular: 2+ radial pulse, RRR  Respiratory: CTAP  Abdomen: BS+, soft, w/o HSM  Skin: clear w/o lesion  Musculoskeletal: No acute inflammation DIP, PIP, MTP joints  Psychiatric: nl mood, nl affect  Neurologic: CN II-XII nl, MS nl, no tremor, speech clear  Labs on Admission:  Basic Metabolic Panel: Recent Labs  Lab 03/28/21 0950  NA 138  K 4.0  CL 106  CO2 21*  GLUCOSE 142*  BUN 28*  CREATININE 1.19*  CALCIUM 9.5   Liver Function Tests: Recent Labs  Lab 03/28/21 0950  AST 22  ALT 20  ALKPHOS 57  BILITOT 1.5*  PROT 7.1  ALBUMIN 4.3   No results for input(s): LIPASE, AMYLASE in the last 168 hours. No results for input(s): AMMONIA in the last 168 hours. CBC: Recent Labs  Lab 03/28/21 0950  WBC 6.3  NEUTROABS 2.6  HGB 13.7   HCT 41.5  MCV 97.4  PLT 162   Cardiac Enzymes: No results for input(s): CKTOTAL, CKMB, CKMBINDEX, TROPONINI in the last 168 hours. BNP: Invalid input(s): POCBNP CBG: Recent Labs  Lab 03/28/21 0942 03/28/21 1730  GLUCAP 141* 92    Radiological Exams on Admission: CT Head Wo Contrast  Result Date: 03/28/2021 CLINICAL DATA:  Altered mental status EXAM: CT HEAD WITHOUT CONTRAST TECHNIQUE: Contiguous axial images were obtained from the base of the skull through the vertex without intravenous contrast. COMPARISON:  Head CT and brain MRI October 22, 2013 FINDINGS: Brain: Mild diffuse atrophy is stable. There is no intracranial mass, hemorrhage, extra-axial fluid collection, or midline shift. There is small vessel disease in the centra semiovale bilaterally, stable. Prior infarct in the inferior left centrum semiovale is again noted. There is subtle decreased attenuation at the gray-white junction of the left posterior frontal lobe at the level of the prior infarct in the left centrum semiovale which may represent a more recent focal infarct in this area, best appreciated on axial slice 19 series 3, sagittal slice 38 series 6, and coronal slices 30 and 31, series 5. There is evidence of a prior small infarct in the periphery of the left cerebellum laterally, stable. Vascular: No hyperdense vessel. There is calcification in each carotid siphon region. Skull: Bony calvarium appears intact. Sinuses/Orbits: There are small retention cysts in each maxillary antrum as well as mucosal thickening medially in each maxillary antrum. There is mucosal thickening and opacification in several ethmoid air cells. Orbits appear symmetric bilaterally except for evidence of prior scleral banding on the left. Other: Mastoid air cells are clear. IMPRESSION: 1. Suggestion of potential recent infarct in the posterior left frontal lobe near the frontal-temporal junction. Nearby prior infarct in this area. No other findings  elsewhere suggesting potential more recent infarct. 2.  Periventricular small vessel disease otherwise appears stable. 3. Prior small infarct in the periphery of the left lateral cerebellum.  4.  No evident mass or hemorrhage. 5.  Foci of arterial vascular calcification. 6.  Foci of paranasal sinus disease. Electronically Signed   By: Lowella Grip III M.D.   On: 03/28/2021 10:35   MR BRAIN WO CONTRAST  Result Date: 03/28/2021 CLINICAL DATA:  Stroke, follow-up. Additional history obtained from the [place] speech. EXAM: MRI HEAD WITHOUT CONTRAST TECHNIQUE: Multiplanar, multiecho pulse sequences of the brain and surrounding structures were obtained without intravenous contrast. COMPARISON:  Head CT 03/28/2021.  MRI/MRA head 10/22/2013. FINDINGS: Brain: Mild cerebral and cerebellar atrophy. Redemonstrated chronic small-vessel infarcts within the left corona radiata and left lentiform nucleus. Background mild multifocal T2/FLAIR hyperintensity within the cerebral white matter and pons is nonspecific, but compatible with chronic small vessel ischemic disease. Redemonstrated small chronic infarcts within the left cerebellar hemisphere. There is no acute infarct. No evidence of intracranial mass. No chronic intracranial blood products. No extra-axial fluid collection. No midline shift. Vascular: Expected proximal arterial flow voids. Non dominant left vertebral artery. Skull and upper cervical spine: No focal marrow lesion. Incompletely assessed upper cervical spondylosis. Sinuses/Orbits: Visualized orbits show no acute finding. Trace bilateral ethmoid and right maxillary sinus mucosal thickening. Small left maxillary sinus mucous retention cyst. IMPRESSION: No evidence of acute intracranial abnormality, including acute infarction. Redemonstrated chronic small-vessel infarcts within the left corona radiata and left basal ganglia. Background mild generalized parenchymal atrophy and chronic  small vessel ischemic disease, slightly progressed from the brain MRI of 10/22/2013. Redemonstrated small chronic infarcts within the left cerebellar hemisphere. Mild paranasal sinus disease, as described. Electronically Signed   By: Kellie Simmering DO   On: 03/28/2021 16:50   DG Chest Portable 1 View  Result Date: 03/28/2021 CLINICAL DATA:  Tiredness.  Rule out pneumonia EXAM: PORTABLE CHEST 1 VIEW COMPARISON:  03/10/2018 FINDINGS: Mild scarring at the bases, stable. No consolidation, edema, convincing effusion, or pneumothorax. Stable borderline heart size. Stable mild aortic tortuosity. IMPRESSION: Stable from prior.  No visible pneumonia. Electronically Signed   By: Monte Fantasia M.D.   On: 03/28/2021 10:55    EKG: Independently reviewed. NSR w/o acute changes  Time spent: 60 min  Adella Hare [facility] Pager [phone]  If 7PM-7AM, please contact night-coverage [email or web address] Password [facility] 03/28/2021, 5:49 PM

## 2021-03-28 NOTE — Discharge Instructions (Addendum)
Your work-up today showed no evidence of acute stroke on the imaging.  The neurology and hospitalist teams felt you are safe for discharge home to follow-up with your primary doctor in 7 to 10 days.  Please rest and stay hydrated.  Please be careful and if you notice any acute changes or worsening, please return to the nearest emergency department immediately.

## 2021-03-28 NOTE — ED Triage Notes (Addendum)
PT BIB daughter from work due to stroke like s/s. Pt reports around 9am this morning. Daughter reports she had slow slurred speech this morning around 9am . Per daughter this is how she was acting when we had her last stroke. Pt reports she is on a blood thinner. Pt is alert and oriented but slow with speech. Per daughter the past 2 days she has been feeling well, tiredness.

## 2021-03-28 NOTE — ED Notes (Signed)
Echo at bedside at this time

## 2021-03-29 LAB — ECHOCARDIOGRAM COMPLETE
Area-P 1/2: 3.17 cm2
S' Lateral: 2.9 cm

## 2021-03-31 ENCOUNTER — Telehealth: Payer: Self-pay

## 2021-03-31 NOTE — Telephone Encounter (Signed)
Please advise below.

## 2021-03-31 NOTE — Telephone Encounter (Signed)
We just like to see patients who were admitted and seen in ER to make sure they are doing okay and go over labs/etc. Wouldn't be a bad idea to repeat her kidney function, was a little elevated.  Dr. Rogers Blocker

## 2021-03-31 NOTE — Telephone Encounter (Signed)
Pt was in ED on Friday. They told her she was dehydrated and recommended following up with PCP. Pt is not sure why she would need to see Dr. Rogers Blocker since she was just here. Please advise if she needs to come in

## 2021-03-31 NOTE — Telephone Encounter (Signed)
I spoke with the pt to give message below. She was transferred to the front desk for scheduling. ED follow up.

## 2021-04-02 ENCOUNTER — Ambulatory Visit (INDEPENDENT_AMBULATORY_CARE_PROVIDER_SITE_OTHER): Payer: Medicare HMO | Admitting: Obstetrics and Gynecology

## 2021-04-02 ENCOUNTER — Ambulatory Visit: Payer: BC Managed Care – PPO | Admitting: Obstetrics and Gynecology

## 2021-04-02 ENCOUNTER — Encounter: Payer: Self-pay | Admitting: Obstetrics and Gynecology

## 2021-04-02 ENCOUNTER — Other Ambulatory Visit: Payer: Self-pay

## 2021-04-02 VITALS — BP 161/83 | HR 84 | Wt 174.0 lb

## 2021-04-02 DIAGNOSIS — N3281 Overactive bladder: Secondary | ICD-10-CM | POA: Diagnosis not present

## 2021-04-02 DIAGNOSIS — N952 Postmenopausal atrophic vaginitis: Secondary | ICD-10-CM | POA: Diagnosis not present

## 2021-04-02 DIAGNOSIS — N393 Stress incontinence (female) (male): Secondary | ICD-10-CM

## 2021-04-02 MED ORDER — ESTRADIOL 0.1 MG/GM VA CREA: 0.5000 g | TOPICAL_CREAM | VAGINAL | 11 refills | Status: DC

## 2021-04-02 NOTE — Patient Instructions (Signed)
For vaginal atrophy (thinning of the vaginal tissue that can cause dryness and burning), start vaginal estrogen therapy 2 times weekly at night. This can be placed with your finger or an applicator inside the vagina and around the urethra.  Please let us know if the prescription is too expensive and we can look for alternative options.   You were fit with a pessary. You are electing to leave it in place until your follow up visit. If it falls out you can try to put it back in (with the knob facing outwards) or you can leave it out. If it seems to be slipping down you can use your finger to push it back in deeper - the deeper it is, the better fit.

## 2021-04-02 NOTE — Progress Notes (Signed)
[facility]   Subjective:     Chief Complaint: Pessary Fitting  History of Present Illness: Cheryl Wood is a 85 y.o. female with stress incontinence and OAB who presents today for a pessary fitting.   For her OAB, she was started on Myrbetriq 25mg  daily at last visit. She has noticed some improvement with her urgency symptoms.   Past Medical History: Patient  has a past medical history of Allergy, Arthritis, Bleeding of blood vessel, Blood in stool, Chronic kidney disease, Depression, Glaucoma, Heart murmur, History of chicken pox, History of recurrent UTIs, History of stomach ulcers, Hyperlipidemia, Hypertension, Memory loss, Rheumatic fever, Seizures ([place]), Stroke ([place]), TIA (transient ischemic attack) (10/22/2013), and Urine incontinence.   Past Surgical History: She  has a past surgical history that includes Abdominal hysterectomy; gum transplant; Cataract extraction (Right, May 13, 2014); RIGHT/LEFT HEART CATH AND CORONARY ANGIOGRAPHY (N/A, 05/12/2018); THORACIC AORTOGRAM (N/A, 05/12/2018); Eye surgery (12/13/2018); Appendectomy (1983); and Tonsillectomy.   Medications: She has a current medication list which includes the following prescription(s): acetaminophen, b complex vitamins, brimonidine, clopidogrel, [START ON 04/03/2021] estradiol, famciclovir, furosemide, remicade, klor-con m20, loratadine, losartan, lovastatin, mirabegron er, pramipexole, prednisolone acetate, sertraline, famotidine, and multiple vitamins-minerals.   Allergies: Patient is allergic to dorzolamide hcl-timolol mal, tetanus toxoids, and hctz [hydrochlorothiazide].   Social History: Patient  reports that she quit smoking about 49 years ago. Her smoking use included cigarettes. She quit after 40.00 years of use. She has never used smokeless tobacco. She reports that she does not drink alcohol and does not use drugs.      Objective:    BP (!) 161/83 Comment: did not take her bp meds until 11am.   Pulse 84   Wt 174 lb (78.9 kg)   LMP  (LMP Unknown)   BMI 32.88 kg/m  Gen: No apparent distress, A&O x 3. Pelvic Exam: Normal external female genitalia; Bartholin's and Skene's glands normal in appearance; urethral meatus normal in appearance, no urethral masses or discharge.   A size 0 incontinence ring (folding) pessary was fitted. It was comfortable, stayed in place with valsalva, standing and bending and was an appropriate size on examination, with one finger fitting between the pessary and the vaginal walls. (Lot # [id number], Exp 09/11/24)  Slight bleeding at introitus with placement of the pessary. Pressure was placed and hemostasis was achieved.   POP-Q (03/26/21):   POP-Q  -2                                            Aa   -2                                           Ba  -6                                              C   1.5                                            Gh  3  Pb  6.5                                            tvl   -1.5                                            Ap  -1.5                                            Bp                                                 D     Assessment/Plan:    Assessment: [name] is a 85 y.o. with stress incontinence and OAB as well as vaginal atrophy.    Plan:  SUI - She was fitted with a #0 incontinence ring pessary. She will keep the pessary in place until next visit.   OAB - We discussed that Myrbetriq can elevate blood pressure (usually with uncontrolled hypertension), and her blood pressure was elevated today. She states she took her BP meds later than normal today since she went to the dentist. She does not usually check her BP at home but has a cuff and can check a few times over the next week. - We will call her to follow up on her blood pressures. If they are persistently elevated, then we will need to find an alternative treatment for her OAB symptoms.  She is not a candidate for anticholinergics due to her glaucoma.   Vaginal atrophy - Will start estrace cream 0.5g twice weekly- she can place with her finger at the introitus. Rx send to the pharmacy.    Follow-up in 4 weeks for follow up or sooner if needed.  All questions were answered.    Jaquita Folds, MD   Time spent: I spent 15 minutes dedicated to the care of this patient on the date of this encounter to include pre-visit review of records, face-to-face time with the patient and post visit documentation and ordering medication/ testing. Additional time was spent for the pessary fitting.

## 2021-04-03 ENCOUNTER — Encounter: Payer: Self-pay | Admitting: Family Medicine

## 2021-04-03 ENCOUNTER — Ambulatory Visit (INDEPENDENT_AMBULATORY_CARE_PROVIDER_SITE_OTHER): Payer: Medicare HMO | Admitting: Family Medicine

## 2021-04-03 VITALS — BP 160/70 | HR 67 | Temp 95.7°F | Ht 61.0 in | Wt 175.6 lb

## 2021-04-03 DIAGNOSIS — R531 Weakness: Secondary | ICD-10-CM

## 2021-04-03 DIAGNOSIS — N179 Acute kidney failure, unspecified: Secondary | ICD-10-CM | POA: Insufficient documentation

## 2021-04-03 LAB — BASIC METABOLIC PANEL
BUN: 34 mg/dL — ABNORMAL HIGH (ref 6–23)
CO2: 31 mEq/L (ref 19–32)
Calcium: 9.9 mg/dL (ref 8.4–10.5)
Chloride: 101 mEq/L (ref 96–112)
Creatinine, Ser: 1.07 mg/dL (ref 0.40–1.20)
GFR: 48.23 mL/min — ABNORMAL LOW (ref >60.00)
Glucose, Bld: 117 mg/dL — ABNORMAL HIGH (ref 70–99)
Potassium: 3.9 mEq/L (ref 3.5–5.1)
Sodium: 139 mEq/L (ref 135–145)

## 2021-04-03 NOTE — Patient Instructions (Signed)
-  let me know if you decide you want to do PT -checking kidneys today. Keep up your water intake.   -let me know if you need anything! D.r Rogers Blocker

## 2021-04-03 NOTE — Progress Notes (Signed)
Patient: Cheryl Wood MRN: [id number] DOB: 01-23-1936 PCP: Orma Flaming, MD     Subjective:  Chief Complaint  Patient presents with  . Follow-up    ED visit; Stroke like symptoms (Speech difficulty)    HPI: The patient is a 85 y.o. female who presents today for ED follow up.she was seen for stroke r/o and weakness. She is feeling better overall. Still has difficulty getting back into her routine at work. No longer feels weak. She thinks she was dehydrated.   Went to ED on 03/28/2021 for being washed out and weak all over for 3 days. She had no headache, weakness of extremity, numbness or tingling. She went to work in the AM around [place] and her office manager noticed that she didn't look "quite right" and she felt like she did with her previous stroke. She thought her speech was slowed, but no droop, aphasia, dyshpahgia or change. In ER she had a CT and MRI of brain done. She initially was going to be admitted until neuro evaluated her and read negative MRI and she was discharged home. Lab work was uneventful. CXR negative. EKG: HR NSR with rate of 69 bpm. Neuro exam was wnl as well.   She tells me today her weakness has resolved. She thinks she may have been dehydrated as [place] and her creatinine was slightly bumped in the ER.   Her blood pressure is high, but was 138/80 this am. She also didn't take her blood pressure medication today.   Imaging and labs reviewed from ED  CT head 1. Suggestion of potential recent infarct in the posterior left frontal lobe near the frontal-temporal junction. Nearby prior infarct in this area. No other findings elsewhere suggesting potential more recent infarct. 2. Periventricular small vessel disease, otherwise appears stable.  3. Prior small infarct in periphery of the left lateral cerebellum.   MRI head No evidence of acute intracranial abnormality, including acute infarction. Re demonstrated chronic  small-vessel infarcts within the let corona radiata and left basal ganglia. Background mild generalized parencymal atrophy and chronic small vessel ischemic disease, slightly progressed from the brain MRI in 2014.  Mild paranasal sinus disease.   Labs reviewed and all wnl except bumped up creatinine.    Review of Systems  Constitutional: Negative for chills, fatigue and fever.  HENT: Negative for dental problem, ear pain, hearing loss and trouble swallowing.   Eyes: Negative for visual disturbance.  Respiratory: Negative for cough, chest tightness and shortness of breath.   Cardiovascular: Negative for chest pain, palpitations and leg swelling.  Gastrointestinal: Negative for abdominal pain, blood in stool, diarrhea and nausea.  Endocrine: Negative for cold intolerance, polydipsia, polyphagia and polyuria.  Genitourinary: Negative for dysuria and hematuria.  Musculoskeletal: Negative for arthralgias.  Skin: Negative for rash.  Neurological: Negative for dizziness and headaches.  Psychiatric/Behavioral: Negative for confusion, dysphoric mood and sleep disturbance. The patient is not nervous/anxious.     Allergies Patient is allergic to dorzolamide hcl-timolol mal, tetanus toxoids, and hctz [hydrochlorothiazide].  Past Medical History Patient  has a past medical history of Allergy, Arthritis, Bleeding of blood vessel, Blood in stool, Chronic kidney disease, Depression, Glaucoma, Heart murmur, History of chicken pox, History of recurrent UTIs, History of stomach ulcers, Hyperlipidemia, Hypertension, Memory loss, Rheumatic fever, Seizures ([place]), Stroke ([place]), TIA (transient ischemic attack) (10/22/2013), and Urine incontinence.  Surgical History Patient  has a past surgical history that includes Abdominal hysterectomy; gum transplant; Cataract extraction (  Right, May 13, 2014); RIGHT/LEFT HEART CATH AND CORONARY ANGIOGRAPHY (N/A, 05/12/2018); THORACIC AORTOGRAM (N/A, 05/12/2018); Eye surgery  (12/13/2018); Appendectomy (1983); and Tonsillectomy.  Family History Pateint's family history includes Alcohol abuse in her maternal grandfather, paternal grandfather, and sister; Arthritis in her brother, father, paternal grandmother, and sister; COPD in her sister; Cancer in her mother and sister; Diabetes in her brother and daughter; Drug abuse in her sister; Early death in her brother, brother, father, maternal grandfather, mother, and sister; Hearing loss in her father; Heart attack in her brother; Heart disease in her brother, daughter, father, paternal grandfather, and sister; Hyperlipidemia in her brother, daughter, father, maternal grandmother, paternal grandfather, and sister; Hypertension in her brother, father, maternal grandmother, paternal grandfather, and sister; Stroke in her maternal grandmother and paternal grandfather.  Social History Patient  reports that she quit smoking about 49 years ago. Her smoking use included cigarettes. She quit after 40.00 years of use. She has never used smokeless tobacco. She reports that she does not drink alcohol and does not use drugs.    Objective: Vitals:   04/03/21 1256 04/03/21 1322  BP: (!) 168/88 (!) 160/70  Pulse: 67   Temp: (!) 95.7 F (35.4 C)   TempSrc: Temporal   SpO2: 95%   Weight: 175 lb 9.6 oz (79.7 kg)   Height: 5\' 1"  (1.549 m)     Body mass index is 33.18 kg/m.  Physical Exam Vitals reviewed.  Constitutional:      Appearance: Normal appearance. She is normal weight.  HENT:     Head: Normocephalic and atraumatic.     Ears:     Comments: Bilateral HA Neck:     Vascular: No carotid bruit.  Cardiovascular:     Rate and Rhythm: Normal rate and regular rhythm.     Heart sounds: Normal heart sounds.  Pulmonary:     Effort: Pulmonary effort is normal.     Breath sounds: Normal breath sounds.  Abdominal:     General: Bowel sounds are normal.     Palpations: Abdomen is soft.  Musculoskeletal:     Cervical back:  Normal range of motion and neck supple.  Skin:    General: Skin is warm.     Capillary Refill: Capillary refill takes less than 2 seconds.  Neurological:     General: No focal deficit present.     Mental Status: She is alert and oriented to person, place, and time.     Cranial Nerves: No cranial nerve deficit.     Sensory: No sensory deficit.     Motor: No weakness.     Gait: Gait normal.     Deep Tendon Reflexes: Reflexes normal.  Psychiatric:        Mood and Affect: Mood normal.        Behavior: Behavior normal.        Assessment/plan: 1. Weakness ? If was volume depleted with high dose of lasix and limited to no water intake. Weakness has resolved with negative stroke work up. She is back to baseline. Blood pressure high today, but did not take medication. Will have regular follow up for her diabetes/blood pressure in June or sooner if needed. She is to let me know if she needs anything in the meantime.   2. AKI (acute kidney injury) ([place]) Recheck labs, although creatinine was around what it was running over past 6 months. She has been drinking more water.  - Basic metabolic panel      Return if  symptoms worsen or fail to improve.   Orma Flaming, MD [place]   04/03/2021

## 2021-04-08 ENCOUNTER — Telehealth: Payer: Self-pay | Admitting: *Deleted

## 2021-04-08 ENCOUNTER — Telehealth (HOSPITAL_BASED_OUTPATIENT_CLINIC_OR_DEPARTMENT_OTHER): Payer: Self-pay | Admitting: *Deleted

## 2021-04-08 NOTE — Telephone Encounter (Signed)
-----   Message from Jaquita Folds, MD sent at 04/02/2021  1:31 PM EDT ----- Regarding: BP check Patient was instructed to check her blood pressure at home over the last week. Can you call her and get the results? We discussed that if they are persistently elevated then she will need to discontinue her Myrbetriq.   Thanks!

## 2021-04-08 NOTE — Telephone Encounter (Signed)
Called pt to verify BP readings. Pt states that she was at her part time job and did not have the readings on hand, but had been checking them. She states that she will call me back later this afternoon.

## 2021-04-08 NOTE — Telephone Encounter (Signed)
Pt called to report BP readings. They are as follows: 4/7  AM: 134/78 4/8  AM: 178/82        PM: 127/78 4/9  AM: 136/86        PM: 114/72 4/10 AM:144/79         PM: 113/78 Advised that I would send her readings to Dr. Wannetta Sender and let her know if anything need to be done different. Pt verbalized understanding.

## 2021-04-09 ENCOUNTER — Other Ambulatory Visit: Payer: Self-pay | Admitting: Family Medicine

## 2021-04-16 DIAGNOSIS — M0600 Rheumatoid arthritis without rheumatoid factor, unspecified site: Secondary | ICD-10-CM | POA: Diagnosis not present

## 2021-04-25 ENCOUNTER — Other Ambulatory Visit: Payer: Self-pay | Admitting: Obstetrics and Gynecology

## 2021-04-25 DIAGNOSIS — N3281 Overactive bladder: Secondary | ICD-10-CM

## 2021-04-25 MED ORDER — VIBEGRON 75 MG PO TABS: 1.0000 | ORAL_TABLET | Freq: Every day | ORAL | 5 refills | Status: DC

## 2021-04-25 NOTE — Progress Notes (Signed)
Myrbetriq no longer covered by insurance. Blair Dolphin is preferred alternative. Prescribed Gemtasa 75mg  daily.   Jaquita Folds, MD

## 2021-04-30 ENCOUNTER — Ambulatory Visit: Payer: Medicare HMO | Admitting: Obstetrics and Gynecology

## 2021-05-01 ENCOUNTER — Ambulatory Visit (INDEPENDENT_AMBULATORY_CARE_PROVIDER_SITE_OTHER): Payer: Medicare HMO | Admitting: Obstetrics and Gynecology

## 2021-05-01 ENCOUNTER — Encounter: Payer: Self-pay | Admitting: Obstetrics and Gynecology

## 2021-05-01 ENCOUNTER — Other Ambulatory Visit: Payer: Self-pay

## 2021-05-01 VITALS — BP 125/73 | HR 69 | Wt 170.4 lb

## 2021-05-01 DIAGNOSIS — N393 Stress incontinence (female) (male): Secondary | ICD-10-CM | POA: Diagnosis not present

## 2021-05-01 DIAGNOSIS — N3281 Overactive bladder: Secondary | ICD-10-CM | POA: Diagnosis not present

## 2021-05-01 NOTE — Progress Notes (Signed)
[facility]   Subjective:     Chief Complaint:  Chief Complaint  Patient presents with  . Pessary Check   History of Present Illness: Cheryl Wood is a 85 y.o. female with stress incontinence and OAB who presents for a pessary check. She is using a size #0 incontinence ring pessary. The pessary has been working well and she has no complaints. She is using vaginal estrogen cream twice a week. She denies vaginal bleeding.  Changed Myrbetriq to [place] last week due to insurance coverage. Has not noticed any changes in her symptoms since the change. She has been doing well with the medication and has only had two accidents in the last few months. She is very happy with it.   Past Medical History: Patient  has a past medical history of Allergy, Arthritis, Bleeding of blood vessel, Blood in stool, Chronic kidney disease, Depression, Glaucoma, Heart murmur, History of chicken pox, History of recurrent UTIs, History of stomach ulcers, Hyperlipidemia, Hypertension, Memory loss, Rheumatic fever, Seizures ([place]), Stroke ([place]), TIA (transient ischemic attack) (10/22/2013), and Urine incontinence.   Past Surgical History: She  has a past surgical history that includes Abdominal hysterectomy; gum transplant; Cataract extraction (Right, May 13, 2014); RIGHT/LEFT HEART CATH AND CORONARY ANGIOGRAPHY (N/A, 05/12/2018); THORACIC AORTOGRAM (N/A, 05/12/2018); Eye surgery (12/13/2018); Appendectomy (1983); and Tonsillectomy.   Medications: She has a current medication list which includes the following prescription(s): acetaminophen, b complex vitamins, brimonidine, clopidogrel, estradiol, famciclovir, famotidine, furosemide, remicade, klor-con m20, loratadine, losartan, lovastatin, multiple vitamins-minerals, pramipexole, prednisolone acetate, sertraline, and vibegron.   Allergies: Patient is allergic to dorzolamide hcl-timolol mal, tetanus toxoids, and hctz [hydrochlorothiazide].   Social  History: Patient  reports that she quit smoking about 49 years ago. Her smoking use included cigarettes. She quit after 40.00 years of use. She has never used smokeless tobacco. She reports that she does not drink alcohol and does not use drugs.      Objective:    Physical Exam: BP 125/73   Pulse 69   Wt 170 lb 6.4 oz (77.3 kg)   LMP  (LMP Unknown)   BMI 32.20 kg/m  Gen: No apparent distress, A&O x 3. Detailed Urogynecologic Evaluation:  Pelvic Exam: Normal external female genitalia; Bartholin's and Skene's glands normal in appearance; urethral meatus normal in appearance, no urethral masses or discharge. The pessary was noted to be in place. It was removed and cleaned. Speculum exam revealed no lesions in the vagina. The pessary was replaced. It was comfortable to the patient and fit well.   POP-Q (03/26/21):   POP-Q  -2 Aa  -2 Ba  -6 C   1.5 Gh  3 Pb  6.5 tvl   -1.5 Ap  -1.5 Bp   D       Assessment/Plan:    Assessment: [name] is a 85 y.o. with stress incontinence and OAB here for a pessary check. She is doing well.  Plan:  SUI - Continue #0 incontinence ring pessary. She will keep the pessary in place until next visit.  - Continue estrogen cream twice a week.   OAB - Continue with Gemtasa 75mg  daily.  - Patient provided with a cost savings card which can reduce her copay.   Follow up 3 months.   Time spent: I spent 20 minutes dedicated to the care of this patient on the date of this encounter to include pre-visit review of records, face-to-face time with  the patient and post visit documentation.

## 2021-05-01 NOTE — Patient Instructions (Addendum)
Continue with the gemtasa for overactive bladder.   Return 3 months for pessary check.

## 2021-05-05 DIAGNOSIS — H4042X2 Glaucoma secondary to eye inflammation, left eye, moderate stage: Secondary | ICD-10-CM | POA: Diagnosis not present

## 2021-05-06 ENCOUNTER — Ambulatory Visit: Payer: Medicare HMO | Admitting: Obstetrics and Gynecology

## 2021-05-20 ENCOUNTER — Telehealth: Payer: Self-pay | Admitting: Obstetrics and Gynecology

## 2021-05-20 DIAGNOSIS — Z961 Presence of intraocular lens: Secondary | ICD-10-CM | POA: Diagnosis not present

## 2021-05-20 DIAGNOSIS — H4042X2 Glaucoma secondary to eye inflammation, left eye, moderate stage: Secondary | ICD-10-CM | POA: Diagnosis not present

## 2021-05-20 DIAGNOSIS — I10 Essential (primary) hypertension: Secondary | ICD-10-CM | POA: Diagnosis not present

## 2021-05-20 DIAGNOSIS — H209 Unspecified iridocyclitis: Secondary | ICD-10-CM | POA: Diagnosis not present

## 2021-05-20 DIAGNOSIS — H35033 Hypertensive retinopathy, bilateral: Secondary | ICD-10-CM | POA: Diagnosis not present

## 2021-05-20 NOTE — Telephone Encounter (Signed)
LMOVM returning pts call.  

## 2021-05-20 NOTE — Telephone Encounter (Signed)
Pt called stating that her pessary had started to fall out. She states that she was able to push it back in but isn't sure if it is in correctly. Pt states that she was straining while having a bowel movement. Advised that it can come down with any time of straining in the pelvic area. She states that it is not causing her any pain or discomfort after pushing it back in. Advised that if it becomes uncomfortable or she experiences any vaginal bleeding or if it falls down again she can call us for an appt. She feels that she is ok to withhold from an appt at this time.

## 2021-05-21 DIAGNOSIS — H4042X2 Glaucoma secondary to eye inflammation, left eye, moderate stage: Secondary | ICD-10-CM | POA: Diagnosis not present

## 2021-06-10 DIAGNOSIS — L821 Other seborrheic keratosis: Secondary | ICD-10-CM | POA: Diagnosis not present

## 2021-06-10 DIAGNOSIS — L578 Other skin changes due to chronic exposure to nonionizing radiation: Secondary | ICD-10-CM | POA: Diagnosis not present

## 2021-06-10 DIAGNOSIS — D1801 Hemangioma of skin and subcutaneous tissue: Secondary | ICD-10-CM | POA: Diagnosis not present

## 2021-06-10 DIAGNOSIS — I8311 Varicose veins of right lower extremity with inflammation: Secondary | ICD-10-CM | POA: Diagnosis not present

## 2021-06-10 DIAGNOSIS — Z859 Personal history of malignant neoplasm, unspecified: Secondary | ICD-10-CM | POA: Diagnosis not present

## 2021-06-10 DIAGNOSIS — I8312 Varicose veins of left lower extremity with inflammation: Secondary | ICD-10-CM | POA: Diagnosis not present

## 2021-06-11 DIAGNOSIS — Z79899 Other long term (current) drug therapy: Secondary | ICD-10-CM | POA: Diagnosis not present

## 2021-06-11 DIAGNOSIS — M0600 Rheumatoid arthritis without rheumatoid factor, unspecified site: Secondary | ICD-10-CM | POA: Diagnosis not present

## 2021-06-27 ENCOUNTER — Other Ambulatory Visit: Payer: Self-pay | Admitting: Family Medicine

## 2021-07-02 ENCOUNTER — Encounter: Payer: Self-pay | Admitting: Family

## 2021-07-02 ENCOUNTER — Ambulatory Visit (INDEPENDENT_AMBULATORY_CARE_PROVIDER_SITE_OTHER)
Admission: RE | Admit: 2021-07-02 | Discharge: 2021-07-02 | Disposition: A | Discharge status: Home / Self Care | Payer: Medicare HMO | Source: Ambulatory Visit | Attending: Family | Admitting: Family

## 2021-07-02 ENCOUNTER — Other Ambulatory Visit: Payer: Self-pay

## 2021-07-02 ENCOUNTER — Ambulatory Visit (INDEPENDENT_AMBULATORY_CARE_PROVIDER_SITE_OTHER): Payer: Medicare HMO | Admitting: Family

## 2021-07-02 VITALS — BP 122/75 | HR 73 | Temp 97.6°F | Ht 61.0 in | Wt 173.4 lb

## 2021-07-02 DIAGNOSIS — M545 Low back pain, unspecified: Secondary | ICD-10-CM | POA: Diagnosis not present

## 2021-07-02 DIAGNOSIS — M5416 Radiculopathy, lumbar region: Secondary | ICD-10-CM | POA: Diagnosis not present

## 2021-07-02 MED ORDER — TRAMADOL HCL 50 MG PO TABS: 50.0000 mg | ORAL_TABLET | Freq: Three times a day (TID) | ORAL | 0 refills | Status: AC | PRN

## 2021-07-03 ENCOUNTER — Other Ambulatory Visit: Payer: Self-pay | Admitting: Family

## 2021-07-03 DIAGNOSIS — M479 Spondylosis, unspecified: Secondary | ICD-10-CM

## 2021-07-03 DIAGNOSIS — M5416 Radiculopathy, lumbar region: Secondary | ICD-10-CM

## 2021-07-03 NOTE — Progress Notes (Signed)
Acute Office Visit  Subjective:    Patient ID: Cheryl Wood, female    DOB: Nov 05, 1936, 85 y.o.   MRN: [id number]  Chief Complaint  Patient presents with  . Referral    PT    HPI Patient is in today with complaints of lower back pain that radiates down her leg into her foot that is been ongoing for several months.  She has been taking Tylenol arthritis that does not help for her symptoms.  The pain is worse when she stands and applies pressure to her left foot.  Pain is a 9 out of 10.  Has a history of scoliosis.  She is requesting physical therapy as it has been beneficial in the past.  Also reports that she has had injections in a different part of the back that have been beneficial. Past Medical History:  Diagnosis Date  . Allergy   . Arthritis   . Bleeding of blood vessel    ext. genitalia area  . Blood in stool   . Chronic kidney disease   . Depression   . Glaucoma   . Heart murmur   . History of chicken pox   . History of recurrent UTIs   . History of stomach ulcers   . Hyperlipidemia   . Hypertension   . Memory loss   . Rheumatic fever   . Seizures ([place])   . Stroke ([place])   . TIA (transient ischemic attack) 10/22/2013  . Urine incontinence     Past Surgical History:  Procedure Laterality Date  . ABDOMINAL HYSTERECTOMY    . APPENDECTOMY  1983  . CATARACT EXTRACTION Right May 13, 2014  . EYE SURGERY  12/13/2018   stent inserted in left eye  . gum transplant    . RIGHT/LEFT HEART CATH AND CORONARY ANGIOGRAPHY N/A 05/12/2018   Procedure: RIGHT/LEFT HEART CATH AND CORONARY ANGIOGRAPHY;  Surgeon: Belva Crome, MD;  Location: [facility];  Service: Cardiovascular;  Laterality: N/A;  . THORACIC AORTOGRAM N/A 05/12/2018   Procedure: THORACIC AORTOGRAM;  Surgeon: Belva Crome, MD;  Location: [facility];  Service: Cardiovascular;  Laterality: N/A;  . TONSILLECTOMY      Family History  Problem Relation Age of Onset  . Cancer Mother   . Early death  Mother   . Heart disease Father   . Arthritis Father   . Early death Father   . Hearing loss Father   . Hypertension Father   . Hyperlipidemia Father   . Cancer Sister   . Arthritis Sister   . Arthritis Brother   . Diabetes Brother   . Heart attack Brother   . Heart disease Brother   . Hyperlipidemia Brother   . Hypertension Brother   . Diabetes Daughter   . Heart disease Daughter   . Hyperlipidemia Daughter   . Hyperlipidemia Maternal Grandmother   . Hypertension Maternal Grandmother   . Stroke Maternal Grandmother   . Alcohol abuse Maternal Grandfather   . Early death Maternal Grandfather        tuberculosis  . Arthritis Paternal Grandmother   . Alcohol abuse Paternal Grandfather   . Heart disease Paternal Grandfather   . Hyperlipidemia Paternal Grandfather   . Hypertension Paternal Grandfather   . Stroke Paternal Grandfather   . Alcohol abuse Sister   . COPD Sister   . Drug abuse Sister   . Early death Sister   . Heart disease Sister   . Hypertension Sister   .  Hyperlipidemia Sister   . Early death Brother   . Early death Brother     Social History   Socioeconomic History  . Marital status: Divorced    Spouse name: Not on file  . Number of children: 2  . Years of education: college  . Highest education level: Not on file  Occupational History    Employer: RETIRED  Tobacco Use  . Smoking status: Former    Years: 40.00    Pack years: 0.00    Types: Cigarettes    Quit date: 11/05/1971    Years since quitting: 49.6  . Smokeless tobacco: Never  Vaping Use  . Vaping Use: Never used  Substance and Sexual Activity  . Alcohol use: No    Alcohol/week: 0.0 standard drinks  . Drug use: No  . Sexual activity: Not Currently  Other Topics Concern  . 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 Difficulty of Paying Living Expenses: Not hard at all  Food Insecurity: No Food Insecurity  . Worried About Charity fundraiser in the Last Year: Never true  . Ran Out of Food in the Last Year: Never true  Transportation Needs: No Transportation Needs  . Lack of Transportation (Medical): No  . Lack of Transportation (Non-Medical): No  Physical Activity: Inactive  . Days of Exercise per Week: 0 days  . Minutes of Exercise per Session: 0 min  Stress: No Stress Concern Present  . Feeling of Stress : Not at all  Social Connections: Moderately Isolated  . Frequency of Communication with Friends and Family: More than three times a week  . Frequency of Social Gatherings with Friends and Family: More than three times a week  . Attends Religious Services: More than 4 times per year  . Active Member of Clubs or Organizations: No  . Attends Archivist Meetings: Never  . Marital Status: Divorced  Human resources officer Violence: Not At Risk  . Fear of Current or Ex-Partner: No  . Emotionally Abused: No  . Physically Abused: No  . Sexually Abused: No    Outpatient Medications Prior to Visit  Medication Sig Dispense Refill  . acetaminophen (TYLENOL) 650 MG CR tablet Take 650 mg by mouth every 8 (eight) hours as needed for pain.    Marland Kitchen b complex vitamins capsule Take 1 capsule by mouth daily.    . brimonidine (ALPHAGAN) 0.2 % ophthalmic solution Place 1 drop into both eyes 2 (two) times daily.    . clopidogrel (PLAVIX) 75 MG tablet Take 1 tablet (75 mg total) by mouth every other day. 90 tablet 3  . estradiol (ESTRACE) 0.1 MG/GM vaginal cream Place 0.5 g vaginally 2 (two) times a week. Place 0.5g twice a week 30 g 11  . famciclovir (FAMVIR) 500 MG tablet Take 500 mg by mouth daily.     . famotidine (PEPCID) 20 MG tablet TAKE ONE TABLET BY MOUTH TWICE A DAY 180 tablet 2  .  furosemide (LASIX) 80 MG tablet TAKE TWO TABLETS BY MOUTH DAILY (Patient taking differently: Take 160 mg by mouth daily.) 180 tablet 2  . inFLIXimab (REMICADE) 100 MG injection  Inject 100 mg into the vein every 8 (eight) weeks.    Marland Kitchen KLOR-CON M20 20 MEQ tablet TAKE ONE TABLET BY MOUTH DAILY (Patient taking differently: Take 20 mEq by mouth daily.) 90 tablet 2  . loratadine (CLARITIN) 10 MG tablet Take 10 mg by mouth daily.    Marland Kitchen losartan (COZAAR) 100 MG tablet TAKE ONE TABLET BY MOUTH DAILY (Patient taking differently: Take 100 mg by mouth daily.) 90 tablet 3  . lovastatin (MEVACOR) 20 MG tablet TAKE ONE TABLET BY MOUTH EVERY EVENING (Patient taking differently: Take 20 mg by mouth at bedtime.) 90 tablet 3  . Multiple Vitamins-Minerals (CENTRUM SILVER 50+WOMEN PO) Take 1 tablet by mouth daily.    . pramipexole (MIRAPEX) 0.25 MG tablet TAKE ONE TABLET BY MOUTH EVERY NIGHT AT BEDTIME (Patient taking differently: Take 0.25 mg by mouth at bedtime.) 90 tablet 1  . prednisoLONE acetate (PRED FORTE) 1 % ophthalmic suspension Place 1 drop into the right eye 4 (four) times daily.    . sertraline (ZOLOFT) 25 MG tablet TAKE ONE TABLET BY MOUTH DAILY (Patient taking differently: Take 25 mg by mouth at bedtime.) 90 tablet 3  . Vibegron 75 MG TABS Take 1 tablet by mouth daily. 30 tablet 5   No facility-administered medications prior to visit.    Allergies  Allergen Reactions  . Dorzolamide Hcl-Timolol Mal Other (See Comments)    Red eyes  . Tetanus Toxoids Swelling    Arm was twice the size it should be  . Hctz [Hydrochlorothiazide] Rash    Review of Systems  Constitutional: Negative.   Respiratory: Negative.    Cardiovascular: Negative.   Gastrointestinal: Negative.   Endocrine: Negative.   Genitourinary: Negative.   Musculoskeletal:  Positive for back pain.       Low back pain that radiates down her left leg  Skin: Negative.   Allergic/Immunologic: Negative.   Neurological: Negative.    Hematological: Negative.   Psychiatric/Behavioral: Negative.        Objective:    Physical Exam Vitals and nursing note reviewed.  Cardiovascular:     Rate and Rhythm: Normal rate and regular rhythm.  Pulmonary:     Effort: Pulmonary effort is normal.     Breath sounds: Normal breath sounds.  Abdominal:     General: Abdomen is flat.     Palpations: Abdomen is soft.  Musculoskeletal:        General: Normal range of motion.     Cervical back: Normal range of motion and neck supple.     Right lower leg: No edema.     Left lower leg: No edema.     Comments: Visible pain when patient goes from sitting to standing.  Range of motion of the torso within normal limits.  Tenderness to palpation of the left sciatic nerve area.  Skin:    General: Skin is warm and dry.  Neurological:     General: No focal deficit present.     Mental Status: She is oriented to person, place, and time.     Deep Tendon Reflexes: Reflexes normal.     Comments: Walks with a limp  Psychiatric:        Mood and Affect: Mood normal.        Behavior: Behavior normal.   BP 122/75   Pulse 73   Temp 97.6 F (36.4 C) (Temporal)   Ht 5\' 1"  (1.549 m)   Wt 173 lb 6.4 oz (78.7 kg)   LMP  (LMP Unknown)  SpO2 95%   BMI 32.76 kg/m  Wt Readings from Last 3 Encounters:  07/02/21 173 lb 6.4 oz (78.7 kg)  05/01/21 170 lb 6.4 oz (77.3 kg)  04/03/21 175 lb 9.6 oz (79.7 kg)    Health Maintenance Due  Topic Date Due  . Zoster Vaccines- Shingrix (1 of 2) Never done  . FOOT EXAM  10/31/2020  . MAMMOGRAM  03/25/2021  . COVID-19 Vaccine (5 - Booster for Moderna series) 05/31/2021  . HEMOGLOBIN A1C  07/03/2021    There are no preventive care reminders to display for this patient.   Lab Results  Component Value Date   TSH 1.82 11/29/2019   Lab Results  Component Value Date   WBC 6.3 03/28/2021   HGB 13.7 03/28/2021   HCT 41.5 03/28/2021   MCV 97.4 03/28/2021   PLT 162 03/28/2021   Lab Results  Component  Value Date   NA 139 04/03/2021   K 3.9 04/03/2021   CO2 31 04/03/2021   GLUCOSE 117 (H) 04/03/2021   BUN 34 (H) 04/03/2021   CREATININE 1.07 04/03/2021   BILITOT 1.5 (H) 03/28/2021   ALKPHOS 57 03/28/2021   AST 22 03/28/2021   ALT 20 03/28/2021   PROT 7.1 03/28/2021   ALBUMIN 4.3 03/28/2021   CALCIUM 9.9 04/03/2021   ANIONGAP 11 03/28/2021   GFR 48.23 (L) 04/03/2021   Lab Results  Component Value Date   CHOL 205 (H) 07/03/2020   Lab Results  Component Value Date   HDL 49.50 07/03/2020   Lab Results  Component Value Date   LDLCALC 132 (H) 07/03/2020   Lab Results  Component Value Date   TRIG 118.0 07/03/2020   Lab Results  Component Value Date   CHOLHDL 4 07/03/2020   Lab Results  Component Value Date   HGBA1C 6.9 (H) 01/03/2021       Assessment & Plan:   Problem List Items Addressed This Visit   None Visit Diagnoses     Lumbar radicular pain    -  Primary   Relevant Orders   DG Lumbar Spine Complete   Ambulatory referral to Physical Therapy        Meds ordered this encounter  Medications  . traMADol (ULTRAM) 50 MG tablet    Sig: Take 1 tablet (50 mg total) by mouth every 8 (eight) hours as needed for up to 5 days.    Dispense:  15 tablet    Refill:  0   Physical therapy ordered today.  Tramadol as needed for very intense pain.  Tylenol arthritis as needed.  X-ray of the L-spine will notify patient pending results consider MRI and possible referral to neurosurgery to see if she is a candidate for steroid injections.  Kennyth Arnold, FNP

## 2021-07-04 ENCOUNTER — Telehealth: Payer: Self-pay

## 2021-07-04 NOTE — Telephone Encounter (Signed)
Patient called and asked if a nurse can go over her x-ray results with her. Please call her at [phone].

## 2021-07-04 NOTE — Telephone Encounter (Signed)
See result notes. Spoke to pt again asked her if she had any questions since I spoke to her earlier about her x-ray. Pt said no, she did not call again. Told pt okay.

## 2021-07-15 ENCOUNTER — Encounter: Payer: Self-pay | Admitting: Orthopaedic Surgery

## 2021-07-15 ENCOUNTER — Other Ambulatory Visit: Payer: Self-pay

## 2021-07-15 ENCOUNTER — Ambulatory Visit (INDEPENDENT_AMBULATORY_CARE_PROVIDER_SITE_OTHER): Payer: Medicare HMO | Admitting: Orthopaedic Surgery

## 2021-07-15 VITALS — Ht 61.0 in | Wt 173.0 lb

## 2021-07-15 DIAGNOSIS — M5416 Radiculopathy, lumbar region: Secondary | ICD-10-CM

## 2021-07-15 MED ORDER — PREDNISONE 10 MG (21) PO TBPK: ORAL_TABLET | ORAL | 0 refills | Status: DC

## 2021-07-15 NOTE — Progress Notes (Signed)
Office Visit Note   Patient: Cheryl Wood           Date of Birth: 23-Sep-1936           MRN: [id number] Visit Date: 07/15/2021              Requested by: Kennyth Arnold, [place],  [place] [address] PCP: Orma Flaming, MD   Assessment & Plan: Visit Diagnoses:  1. Lumbar radiculopathy     Plan: Based on findings impression is lumbar radiculopathy.  She does have underlying severe degenerative scoliosis.  Based on options she is agreeable to trying a prednisone Dosepak and she already has an appointment for outpatient physical therapy at [facility].  She will follow-up with one of our spine specialists if she does not feel any improvement.  Follow-Up Instructions: Return if symptoms worsen or fail to improve.   Orders:  No orders of the defined types were placed in this encounter.  Meds ordered this encounter  Medications   predniSONE (STERAPRED UNI-PAK 21 TAB) 10 MG (21) TBPK tablet    Sig: Take as directed    Dispense:  21 tablet    Refill:  0      Procedures: No procedures performed   Clinical Data: No additional findings.   Subjective: Chief Complaint  Patient presents with   Lower Back - Pain    [name] is a very pleasant 85 year old female who comes in for evaluation of 1 month of left-sided low back pain with radiculopathy into the left leg past the knee all the way into the foot.  Denies any bowel or bladder incontinence.  Denies any injuries.  Denies any numbness and tingling.  She has not had any previous back surgeries.  She has had previous injections in her back and physical therapy and dry needling years ago which helped.  She is on Remicade for rheumatoid arthritis.  She has been taking Tylenol on a daily basis.   Review of Systems  Constitutional: Negative.   HENT: Negative.    Eyes: Negative.   Respiratory: Negative.    Cardiovascular: Negative.   Endocrine: Negative.   Musculoskeletal: Negative.   Neurological:  Negative.   Hematological: Negative.   Psychiatric/Behavioral: Negative.    All other systems reviewed and are negative.   Objective: Vital Signs: Ht 5\' 1"  (1.549 m)   Wt 173 lb (78.5 kg)   LMP  (LMP Unknown)   BMI 32.69 kg/m   Physical Exam Vitals and nursing note reviewed.  Constitutional:      Appearance: She is well-developed.  HENT:     Head: Normocephalic and atraumatic.  Pulmonary:     Effort: Pulmonary effort is normal.  Abdominal:     Palpations: Abdomen is soft.  Musculoskeletal:     Cervical back: Neck supple.  Skin:    General: Skin is warm.     Capillary Refill: Capillary refill takes less than 2 seconds.  Neurological:     Mental Status: She is alert and oriented to person, place, and time.  Psychiatric:        Behavior: Behavior normal.        Thought Content: Thought content normal.        Judgment: Judgment normal.    Ortho Exam Lumbar spine exam shows tenderness to the paraspinous muscles.  Slight weakness to hip flexion and knee extension secondary to pain.  She has no tenderness to the hip.  Positive sciatic tension sign.  Specialty Comments:  No specialty comments available.  Imaging: No results found.   PMFS History: Patient Active Problem List   Diagnosis Date Noted   Lumbar radiculopathy 07/15/2021   Rheumatoid arthritis ([place]) 01/03/2021   Depression, major, single episode, mild ([place]) 09/29/2019   Polyarthralgia 04/21/2019   Diabetes mellitus without complication ([place]) 84/13/2440   CVA (cerebral vascular accident) ([place]) 01/25/2019   RLS (restless legs syndrome) 01/25/2019   Glaucoma, left eye 01/25/2019   Glaucoma associated with ocular inflammation, left, 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   Nonrheumatic aortic valve insufficiency    CAD in native artery    TIA (transient ischemic attack) 10/22/2013   HTN (hypertension) 10/22/2013   Hyperlipidemia 10/22/2013   Past Medical History:  Diagnosis Date   Allergy    Arthritis    Bleeding of  blood vessel    ext. genitalia area   Blood in stool    Chronic kidney disease    Depression    Glaucoma    Heart murmur    History of chicken pox    History of recurrent UTIs    History of stomach ulcers    Hyperlipidemia    Hypertension    Memory loss    Rheumatic fever    Seizures ([place])    Stroke ([place])    TIA (transient ischemic attack) 10/22/2013   Urine incontinence     Family History  Problem Relation Age of Onset   Cancer Mother    Early death Mother    Heart disease Father    Arthritis Father    Early death Father    Hearing loss Father    Hypertension Father    Hyperlipidemia Father    Cancer Sister    Arthritis Sister    Arthritis Brother    Diabetes Brother    Heart attack Brother    Heart disease Brother    Hyperlipidemia Brother    Hypertension Brother    Diabetes Daughter    Heart disease Daughter    Hyperlipidemia Daughter    Hyperlipidemia Maternal Grandmother    Hypertension Maternal Grandmother    Stroke Maternal Grandmother    Alcohol abuse Maternal Grandfather    Early death Maternal Grandfather        tuberculosis   Arthritis Paternal Grandmother    Alcohol abuse Paternal Grandfather    Heart disease Paternal Grandfather    Hyperlipidemia Paternal Grandfather    Hypertension Paternal Grandfather    Stroke Paternal Grandfather    Alcohol abuse Sister    COPD Sister    Drug abuse Sister    Early death Sister    Heart disease Sister    Hypertension Sister    Hyperlipidemia Sister    Early death Brother    Early death Brother     Past Surgical History:  Procedure Laterality Date   ABDOMINAL HYSTERECTOMY     APPENDECTOMY  1983   CATARACT EXTRACTION Right May 13, 2014   EYE SURGERY  12/13/2018   stent inserted in left eye   gum transplant     RIGHT/LEFT HEART CATH AND CORONARY ANGIOGRAPHY N/A 05/12/2018   Procedure: RIGHT/LEFT HEART CATH AND CORONARY ANGIOGRAPHY;  Surgeon: Belva Crome, MD;  Location: [facility];  Service:  Cardiovascular;  Laterality: N/A;   THORACIC AORTOGRAM N/A 05/12/2018   Procedure: THORACIC AORTOGRAM;  Surgeon: Belva Crome, MD;  Location: [facility];  Service: Cardiovascular;  Laterality: N/A;   TONSILLECTOMY     Social History  Occupational History    Employer: RETIRED  Tobacco Use   Smoking status: Former    Years: 40.00    Types: Cigarettes    Quit date: 11/05/1971    Years since quitting: 49.7   Smokeless tobacco: Never  Vaping Use   Vaping Use: Never used  Substance and Sexual Activity   Alcohol use: No    Alcohol/week: 0.0 standard drinks   Drug use: No   Sexual activity: Not Currently

## 2021-07-17 ENCOUNTER — Ambulatory Visit (INDEPENDENT_AMBULATORY_CARE_PROVIDER_SITE_OTHER): Payer: Medicare HMO | Admitting: Physical Therapy

## 2021-07-17 ENCOUNTER — Other Ambulatory Visit: Payer: Self-pay

## 2021-07-17 DIAGNOSIS — M5416 Radiculopathy, lumbar region: Secondary | ICD-10-CM

## 2021-07-17 DIAGNOSIS — M25552 Pain in left hip: Secondary | ICD-10-CM | POA: Diagnosis not present

## 2021-07-23 ENCOUNTER — Ambulatory Visit (INDEPENDENT_AMBULATORY_CARE_PROVIDER_SITE_OTHER): Payer: Medicare HMO | Admitting: Physical Therapy

## 2021-07-23 ENCOUNTER — Encounter: Payer: Self-pay | Admitting: Physical Therapy

## 2021-07-23 ENCOUNTER — Other Ambulatory Visit: Payer: Self-pay

## 2021-07-23 DIAGNOSIS — M25552 Pain in left hip: Secondary | ICD-10-CM

## 2021-07-23 DIAGNOSIS — M5416 Radiculopathy, lumbar region: Secondary | ICD-10-CM | POA: Diagnosis not present

## 2021-07-23 NOTE — Therapy (Signed)
[facility] [address] [place], [place], [address] Phone: [phone]   Fax:  [phone]  Physical Therapy Evaluation  Patient Details  Name: Cheryl Wood MRN: [id number] Date of Birth: 03/20/1936 Referring Provider (PT): Erlinda Hong, [place]   Encounter Date: 07/17/2021   PT End of Session - 07/23/21 1030     Visit Number 1    Number of Visits 12    Date for PT Re-Evaluation 08/28/21    Authorization Type Aetna Medicare    PT Start Time 0800    PT Stop Time 0844    PT Time Calculation (min) 44 min    Activity Tolerance Patient tolerated treatment well    Behavior During Therapy [facility] for tasks assessed/performed             Past Medical History:  Diagnosis Date   Allergy    Arthritis    Bleeding of blood vessel    ext. genitalia area   Blood in stool    Chronic kidney disease    Depression    Glaucoma    Heart murmur    History of chicken pox    History of recurrent UTIs    History of stomach ulcers    Hyperlipidemia    Hypertension    Memory loss    Rheumatic fever    Seizures ([facility])    Stroke ([facility])    TIA (transient ischemic attack) 10/22/2013   Urine incontinence     Past Surgical History:  Procedure Laterality Date   ABDOMINAL HYSTERECTOMY     APPENDECTOMY  1983   CATARACT EXTRACTION Right May 13, 2014   EYE SURGERY  12/13/2018   stent inserted in left eye   gum transplant     RIGHT/LEFT HEART CATH AND CORONARY ANGIOGRAPHY N/A 05/12/2018   Procedure: RIGHT/LEFT HEART CATH AND CORONARY ANGIOGRAPHY;  Surgeon: Belva Crome, MD;  Location: [facility];  Service: Cardiovascular;  Laterality: N/A;   THORACIC AORTOGRAM N/A 05/12/2018   Procedure: THORACIC AORTOGRAM;  Surgeon: Belva Crome, MD;  Location: [facility];  Service: Cardiovascular;  Laterality: N/A;   TONSILLECTOMY      There were no vitals filed for this visit.    Subjective Assessment - 07/23/21 1028     Subjective Pt has had  ongoing back pain. Is currently on predisone, 3 more days. Also had PT and DN in the past that helped and is still doing HEP. She has also had previous injections that pain relief lasted for years. Currently increased pain, no incident to report. Pt works 3 days/wk. Has significant pain in L side of back, into hip and down entire L leg. Increased pain in back and leg with standing and walking. Pain was up to 10/10 prior to prednisone, this am, very minimal... last night pain up to 8/10.    Pertinent History Scoliosis, CVA, TIA, DM,    Limitations Standing;House hold activities;Walking    Patient Stated Goals decreased pain,    Currently in Pain? Yes    Pain Score 8     Pain Location Back    Pain Orientation Left    Pain Descriptors / Indicators Aching    Pain Type Acute pain    Pain Radiating Towards into L LE    Pain Onset More than a month ago    Pain Frequency Intermittent    Effect of Pain on Daily Activities Pain up to 8/10 at end of day.Was up to 10/10  prior to prednisone                [facility] PT Assessment - 07/23/21 0001       Assessment   Medical Diagnosis Back Pain    Referring Provider (PT) Erlinda Hong, [place]    Prior Therapy 2 years ago      Precautions   Precautions None      Balance Screen   Has the patient fallen in the past 6 months No      Prior Function   Level of Independence Independent      Cognition   Overall Cognitive Status Within Functional Limits for tasks assessed      AROM   Overall AROM Comments lumbar: mild limitations for flexion, SB, mod limitations for extension;  Hips: mild limitation for flexion, mod limitation for rotation bil, L>R.      Strength   Overall Strength Comments hip flex: 4/5, abd: 4-/5.      Palpation   Palpation comment Tenderness in bil lumbar paraspinals, into L glute      Special Tests   Other special tests Neg SLR,                        Objective measurements completed on examination: See above  findings.       [facility] Adult PT Treatment/Exercise - 07/23/21 0001       Exercises   Exercises Lumbar      Lumbar Exercises: Stretches   Single Knee to Chest Stretch 3 reps;30 seconds    Piriformis Stretch 3 reps;30 seconds    Piriformis Stretch Limitations hip IR across body      Lumbar Exercises: Supine   Pelvic Tilt --    Bridge --    Straight Leg Raise 10 reps    Straight Leg Raises Limitations education for HEP, pt was doing already, but mechanics adjusted.      Lumbar Exercises: Sidelying   Clam --      Manual Therapy   Manual Therapy Joint mobilization;Manual Traction    Manual Traction Long leg distraction for lumbar pump x 2 min bil;                    PT Education - 07/23/21 1030     Education Details PT POC, Exam findings, HEP    Person(s) Educated Patient    Methods Explanation;Demonstration;Tactile cues;Verbal cues;Handout    Comprehension Verbalized understanding;Returned demonstration;Verbal cues required;Tactile cues required;Need further instruction              PT Short Term Goals - 07/23/21 1052       PT SHORT TERM GOAL #1   Title Pt to be independent with initial HEP    Time 2    Period Weeks    Status New    Target Date 07/31/21               PT Long Term Goals - 07/23/21 1052       PT LONG TERM GOAL #1   Title Pt to be independent with final HEP    Time 6    Period Weeks    Status New    Target Date 08/28/21      PT LONG TERM GOAL #2   Title Pt to report decreased pain in back and LE to 0-2/10 with standing activity .    Time 6    Period Weeks    Status New  Target Date 08/28/21      PT LONG TERM GOAL #3   Title Pt to demo ability for standing, walking activity for at least 30 min, without pain in back/LE, to improve ability for IADLS and work duties.    Time 6    Period Weeks    Status New    Target Date 08/28/21      PT LONG TERM GOAL #4   Title Pt to demo improved strength of bil hips to at least  4+/5 to improve stabilty and pain.    Time 6    Period Weeks    Status New    Target Date 08/28/21                    Plan - 07/23/21 1036     Clinical Impression Statement Pt presents wtih primary complaint of increased pain in low back and L LE. She has significant pain with increased standing, walking, and activity, which is limitng ability for functional activitiy, IADLs, and work duties. Pt with weakness in hips and core, does have HEP from previously, but was updated and reviewed today. Pt tp benefit from skilled PT to improve deficits and return to PLOF without pain or deficit.    Examination-Activity Limitations Lift;Stand;Locomotion Level;Bend;Stairs    Examination-Participation Restrictions Cleaning;Meal Prep;Yard Work;Community Activity    Stability/Clinical Decision Making Stable/Uncomplicated    Clinical Decision Making Low    Rehab Potential Good    PT Frequency 2x / week    PT Duration 6 weeks    PT Treatment/Interventions ADLs/Self Care Home Management;Cryotherapy;Electrical Stimulation;DME Instruction;Ultrasound;Traction;Moist Heat;Iontophoresis '4mg'$ /ml Dexamethasone;Gait training;Stair training;Functional mobility training;Therapeutic activities;Therapeutic exercise;Balance training;Neuromuscular re-education;Patient/family education;Manual techniques;Passive range of motion;Dry needling;Joint Manipulations;Spinal Manipulations    PT Home Exercise Plan [email or web address]    Consulted and Agree with Plan of Care Patient             Patient will benefit from skilled therapeutic intervention in order to improve the following deficits and impairments:  Pain, Decreased mobility, Increased muscle spasms, Decreased strength, Decreased range of motion, Decreased activity tolerance  Visit Diagnosis: Radiculopathy, lumbar region  Pain in left hip     Problem List Patient Active Problem List   Diagnosis Date Noted   Lumbar radiculopathy 07/15/2021   Rheumatoid  arthritis ([place]) 01/03/2021   Depression, major, single episode, mild ([place]) 09/29/2019   Polyarthralgia 04/21/2019   Diabetes mellitus without complication ([place]) 123XX123   CVA (cerebral vascular accident) ([place]) 01/25/2019   RLS (restless legs syndrome) 01/25/2019   Glaucoma, left eye 01/25/2019   Glaucoma associated with ocular inflammation, left, 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

## 2021-07-23 NOTE — Patient Instructions (Addendum)
Access Code: [email or web address] URL: [email or web address] Date: 07/23/2021 Prepared by: Lyndee Hensen  Exercises Single Knee to Chest Stretch - 2 x daily - 3 reps - 30 hold Supine Piriformis Stretch with Leg Straight - 2 x daily - 3 reps - 30 hold Supine Piriformis Stretch Pulling Heel to Hip - 2 x daily - 3 reps - 30 hold  Straight Leg Raise - 1 x daily - 1 sets - 10 reps

## 2021-07-23 NOTE — Therapy (Signed)
[facility] [address] [place], [place], [address] Phone: [phone]   Fax:  [phone]  Physical Therapy Treatment  Patient Details  Name: Cheryl Wood MRN: [id number] Date of Birth: Jan 28, 1936 Referring Provider (PT): Erlinda Hong, [place]   Encounter Date: 07/23/2021   PT End of Session - 07/23/21 1113     Visit Number 2    Number of Visits 12    Date for PT Re-Evaluation 08/28/21    Authorization Type Aetna Medicare    PT Start Time 0930    PT Stop Time 1025    PT Time Calculation (min) 55 min    Activity Tolerance Patient tolerated treatment well    Behavior During Therapy [facility] for tasks assessed/performed             Past Medical History:  Diagnosis Date   Allergy    Arthritis    Bleeding of blood vessel    ext. genitalia area   Blood in stool    Chronic kidney disease    Depression    Glaucoma    Heart murmur    History of chicken pox    History of recurrent UTIs    History of stomach ulcers    Hyperlipidemia    Hypertension    Memory loss    Rheumatic fever    Seizures ([facility])    Stroke ([facility])    TIA (transient ischemic attack) 10/22/2013   Urine incontinence     Past Surgical History:  Procedure Laterality Date   ABDOMINAL HYSTERECTOMY     APPENDECTOMY  1983   CATARACT EXTRACTION Right May 13, 2014   EYE SURGERY  12/13/2018   stent inserted in left eye   gum transplant     RIGHT/LEFT HEART CATH AND CORONARY ANGIOGRAPHY N/A 05/12/2018   Procedure: RIGHT/LEFT HEART CATH AND CORONARY ANGIOGRAPHY;  Surgeon: Belva Crome, MD;  Location: [facility];  Service: Cardiovascular;  Laterality: N/A;   THORACIC AORTOGRAM N/A 05/12/2018   Procedure: THORACIC AORTOGRAM;  Surgeon: Belva Crome, MD;  Location: [facility];  Service: Cardiovascular;  Laterality: N/A;   TONSILLECTOMY      There were no vitals filed for this visit.   Subjective Assessment - 07/23/21 1113     Subjective Pt states most  pain in L glute, minimal pain in back. Does think pain is slightly better, but still having most pain wtih standing, walking activities. . No longer taking prednisone.    Currently in Pain? Yes    Pain Score 5     Pain Location Hip    Pain Orientation Left    Pain Descriptors / Indicators Aching    Pain Type Acute pain    Pain Onset More than a month ago    Pain Frequency Intermittent                               [facility] Adult PT Treatment/Exercise - 07/23/21 1109       Exercises   Exercises Lumbar      Lumbar Exercises: Stretches   Single Knee to Chest Stretch 3 reps;30 seconds    Lower Trunk Rotation 5 reps;10 seconds    Pelvic Tilt 20 reps    Piriformis Stretch 3 reps;30 seconds    Piriformis Stretch Limitations supine fig 4, and hip IR across body      Lumbar Exercises: Supine   Bridge 15 reps  Lumbar Exercises: Sidelying   Clam 15 reps;Left      Modalities   Modalities Moist Heat      Moist Heat Therapy   Number Minutes Moist Heat 10 Minutes    Moist Heat Location Hip      Manual Therapy   Manual Therapy Joint mobilization;Manual Traction;Soft tissue mobilization    Manual therapy comments skilled palpation and monitoring of soft tissue with dry needling.    Joint Mobilization hip inf and post mobs gr 3 on L;    Soft tissue mobilization DTM/ TPR to L glute, piriformis.    Manual Traction Long leg distraction for lumbar pump x 2 min bil;              Trigger Point Dry Needling - 07/23/21 1111     Consent Given? Yes    Education Handout Provided Yes    Muscles Treated Back/Hip Piriformis    Piriformis Response Palpable increased muscle length   L                   PT Short Term Goals - 07/23/21 1052       PT SHORT TERM GOAL #1   Title Pt to be independent with initial HEP    Time 2    Period Weeks    Status New    Target Date 07/31/21               PT Long Term Goals - 07/23/21 1052       PT LONG TERM  GOAL #1   Title Pt to be independent with final HEP    Time 6    Period Weeks    Status New    Target Date 08/28/21      PT LONG TERM GOAL #2   Title Pt to report decreased pain in back and LE to 0-2/10 with standing activity .    Time 6    Period Weeks    Status New    Target Date 08/28/21      PT LONG TERM GOAL #3   Title Pt to demo ability for standing, walking activity for at least 30 min, without pain in back/LE, to improve ability for IADLS and work duties.    Time 6    Period Weeks    Status New    Target Date 08/28/21      PT LONG TERM GOAL #4   Title Pt to demo improved strength of bil hips to at least 4+/5 to improve stabilty and pain.    Time 6    Period Weeks    Status New    Target Date 08/28/21                   Plan - 07/23/21 1115     Clinical Impression Statement Pt with most pain in L hip today, in deep rotators. Focus of manual therapy for decpmpression and muscle tension  today, wiht tissue release and Dry needling. Pt with good tolerance. Reviewed/updated HEP. Plan to progress strength as tolerated, as pain improves.    Examination-Activity Limitations Lift;Stand;Locomotion Level;Bend;Stairs    Examination-Participation Restrictions Cleaning;Meal Prep;Yard Work;Community Activity    Stability/Clinical Decision Making Stable/Uncomplicated    Rehab Potential Good    PT Frequency 2x / week    PT Duration 6 weeks    PT Treatment/Interventions ADLs/Self Care Home Management;Cryotherapy;Electrical Stimulation;DME Instruction;Ultrasound;Traction;Moist Heat;Iontophoresis '4mg'$ /ml Dexamethasone;Gait training;Stair training;Functional mobility training;Therapeutic activities;Therapeutic exercise;Balance training;Neuromuscular re-education;Patient/family education;Manual techniques;Passive range of  motion;Dry needling;Joint Manipulations;Spinal Manipulations    PT Home Exercise Plan [email or web address]    Consulted and Agree with Plan of Care Patient              Patient will benefit from skilled therapeutic intervention in order to improve the following deficits and impairments:  Pain, Decreased mobility, Increased muscle spasms, Decreased strength, Decreased range of motion, Decreased activity tolerance  Visit Diagnosis: Radiculopathy, lumbar region  Pain in left hip     Problem List Patient Active Problem List   Diagnosis Date Noted   Lumbar radiculopathy 07/15/2021   Rheumatoid arthritis ([place]) 01/03/2021   Depression, major, single episode, mild ([place]) 09/29/2019   Polyarthralgia 04/21/2019   Diabetes mellitus without complication ([place]) [id number]   CVA (cerebral vascular accident) ([place]) 01/25/2019   RLS (restless legs syndrome) 01/25/2019   Glaucoma, left eye 01/25/2019   Glaucoma associated with ocular inflammation, left, 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

## 2021-07-23 NOTE — Patient Instructions (Signed)
Access Code: [email or web address] URL: [email or web address] Date: 07/23/2021 Prepared by: Lyndee Hensen  Exercises Single Knee to Chest Stretch - 2 x daily - 3 reps - 30 hold Supine Piriformis Stretch with Leg Straight - 2 x daily - 3 reps - 30 hold Supine Piriformis Stretch Pulling Heel to Hip - 2 x daily - 3 reps - 30 hold Supine Lower Trunk Rotation - 2 x daily - 10 reps - 5 hold Clamshell - 1 x daily - 2 sets - 10 reps Straight Leg Raise - 1 x daily - 1 sets - 10 reps

## 2021-07-24 DIAGNOSIS — H4042X2 Glaucoma secondary to eye inflammation, left eye, moderate stage: Secondary | ICD-10-CM | POA: Diagnosis not present

## 2021-07-25 ENCOUNTER — Other Ambulatory Visit: Payer: Self-pay | Admitting: Family Medicine

## 2021-07-25 DIAGNOSIS — Z1231 Encounter for screening mammogram for malignant neoplasm of breast: Secondary | ICD-10-CM

## 2021-07-28 ENCOUNTER — Other Ambulatory Visit: Payer: Self-pay

## 2021-07-28 ENCOUNTER — Encounter: Payer: Self-pay | Admitting: Physical Therapy

## 2021-07-28 ENCOUNTER — Ambulatory Visit (INDEPENDENT_AMBULATORY_CARE_PROVIDER_SITE_OTHER): Payer: Medicare HMO | Admitting: Physical Therapy

## 2021-07-28 DIAGNOSIS — M25552 Pain in left hip: Secondary | ICD-10-CM | POA: Diagnosis not present

## 2021-07-28 DIAGNOSIS — M5416 Radiculopathy, lumbar region: Secondary | ICD-10-CM | POA: Diagnosis not present

## 2021-07-28 NOTE — Therapy (Signed)
[facility] [address] [place], [place], [address] Phone: [phone]   Fax:  [phone]  Physical Therapy Treatment  Patient Details  Name: Cheryl Wood MRN: [id number] Date of Birth: 10/06/1936 Referring Provider (PT): Erlinda Hong, [place]   Encounter Date: 07/28/2021   PT End of Session - 07/28/21 0958     Visit Number 3    Number of Visits 12    Date for PT Re-Evaluation 08/28/21    Authorization Type [employer] Medicare    PT Start Time 0935    PT Stop Time 1015    PT Time Calculation (min) 40 min    Activity Tolerance Patient tolerated treatment well    Behavior During Therapy [facility] for tasks assessed/performed             Past Medical History:  Diagnosis Date   Allergy    Arthritis    Bleeding of blood vessel    ext. genitalia area   Blood in stool    Chronic kidney disease    Depression    Glaucoma    Heart murmur    History of chicken pox    History of recurrent UTIs    History of stomach ulcers    Hyperlipidemia    Hypertension    Memory loss    Rheumatic fever    Seizures (HCC)    Stroke (HCC)    TIA (transient ischemic attack) 10/22/2013   Urine incontinence     Past Surgical History:  Procedure Laterality Date   ABDOMINAL HYSTERECTOMY     APPENDECTOMY  1983   CATARACT EXTRACTION Right May 13, 2014   EYE SURGERY  12/13/2018   stent inserted in left eye   gum transplant     RIGHT/LEFT HEART CATH AND CORONARY ANGIOGRAPHY N/A 05/12/2018   Procedure: RIGHT/LEFT HEART CATH AND CORONARY ANGIOGRAPHY;  Surgeon: Belva Crome, MD;  Location: [place] [facility];  Service: Cardiovascular;  Laterality: N/A;   THORACIC AORTOGRAM N/A 05/12/2018   Procedure: THORACIC AORTOGRAM;  Surgeon: Belva Crome, MD;  Location: [place] [facility];  Service: Cardiovascular;  Laterality: N/A;   TONSILLECTOMY      There were no vitals filed for this visit.   Subjective Assessment - 07/28/21 0939     Subjective Pt states pain in  L glute still there, but still better than it was initally. Thinks needling helped.    Currently in Pain? Yes    Pain Score 4     Pain Location Hip    Pain Orientation Left    Pain Descriptors / Indicators Aching    Pain Type Acute pain    Pain Onset More than a month ago    Pain Frequency Intermittent                               [facility] Adult PT Treatment/Exercise - 07/28/21 0001       Exercises   Exercises Lumbar      Lumbar Exercises: Stretches   Active Hamstring Stretch 2 reps;30 seconds    Active Hamstring Stretch Limitations seated    Single Knee to Chest Stretch --    Lower Trunk Rotation 5 reps;10 seconds    Pelvic Tilt 20 reps    Piriformis Stretch 30 seconds;2 reps    Piriformis Stretch Limitations supine fig 4,      Lumbar Exercises: Aerobic   Recumbent Bike L 1 x 5  min;      Lumbar Exercises: Standing   Other Standing Lumbar Exercises --      Lumbar Exercises: Supine   Clam 15 reps    Clam Limitations GTB    Bridge 15 reps      Lumbar Exercises: Sidelying   Clam --    Hip Abduction 15 reps      Modalities   Modalities Moist Heat      Moist Heat Therapy   Moist Heat Location --      Manual Therapy   Manual Therapy Joint mobilization;Manual Traction;Soft tissue mobilization    Manual therapy comments --    Joint Mobilization --    Soft tissue mobilization DTM/ TPR to L glute, piriformis.    Manual Traction Long leg distraction for hip and  lumbar pump x 2 min on L;                      PT Short Term Goals - 07/23/21 1052       PT SHORT TERM GOAL #1   Title Pt to be independent with initial HEP    Time 2    Period Weeks    Status New    Target Date 07/31/21               PT Long Term Goals - 07/23/21 1052       PT LONG TERM GOAL #1   Title Pt to be independent with final HEP    Time 6    Period Weeks    Status New    Target Date 08/28/21      PT LONG TERM GOAL #2   Title Pt to report  decreased pain in back and LE to 0-2/10 with standing activity .    Time 6    Period Weeks    Status New    Target Date 08/28/21      PT LONG TERM GOAL #3   Title Pt to demo ability for standing, walking activity for at least 30 min, without pain in back/LE, to improve ability for IADLS and work duties.    Time 6    Period Weeks    Status New    Target Date 08/28/21      PT LONG TERM GOAL #4   Title Pt to demo improved strength of bil hips to at least 4+/5 to improve stabilty and pain.    Time 6    Period Weeks    Status New    Target Date 08/28/21                   Plan - 07/28/21 1028     Clinical Impression Statement Pt continues to have quite a bit of soreness and tenderness in L glute today. Very mimimal back pain. Pt able to do very well with strengthening, but pain reproduced with palpation as well as increased activity. Pt to benefit from continued manual for pain relief, as well as progression of strengthening as tolerated.    Examination-Activity Limitations Lift;Stand;Locomotion Level;Bend;Stairs    Examination-Participation Restrictions Cleaning;Meal Prep;Yard Work;Community Activity    Stability/Clinical Decision Making Stable/Uncomplicated    Rehab Potential Good    PT Frequency 2x / week    PT Duration 6 weeks    PT Treatment/Interventions ADLs/Self Care Home Management;Cryotherapy;Electrical Stimulation;DME Instruction;Ultrasound;Traction;Moist Heat;Iontophoresis '4mg'$ /ml Dexamethasone;Gait training;Stair training;Functional mobility training;Therapeutic activities;Therapeutic exercise;Balance training;Neuromuscular re-education;Patient/family education;Manual techniques;Passive range of motion;Dry needling;Joint Manipulations;Spinal Manipulations    PT Home  Exercise Plan [id number]    Consulted and Agree with Plan of Care Patient             Patient will benefit from skilled therapeutic intervention in order to improve the following deficits and  impairments:  Pain, Decreased mobility, Increased muscle spasms, Decreased strength, Decreased range of motion, Decreased activity tolerance  Visit Diagnosis: Radiculopathy, lumbar region  Pain in left hip     Problem List Patient Active Problem List   Diagnosis Date Noted   Lumbar radiculopathy 07/15/2021   Rheumatoid arthritis ([place]) 01/03/2021   Depression, major, single episode, mild ([place]) 09/29/2019   Polyarthralgia 04/21/2019   Diabetes mellitus without complication ([place]) 123XX123   CVA (cerebral vascular accident) ([place]) 01/25/2019   RLS (restless legs syndrome) 01/25/2019   Glaucoma, left eye 01/25/2019   Glaucoma associated with ocular inflammation, left, 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   Nonrheumatic aortic valve insufficiency    CAD in native artery    TIA (transient ischemic attack) 10/22/2013   HTN (hypertension) 10/22/2013   Hyperlipidemia 10/22/2013   Lyndee Hensen, PT, DPT 10:31 AM  07/28/21    [facility] [place] [address] [place], [place], [address] Phone: [phone]   Fax:  [phone]  Name: Cheryl Wood MRN: [id number] Date of Birth: 1936/08/25

## 2021-07-29 ENCOUNTER — Other Ambulatory Visit: Payer: Self-pay | Admitting: Family Medicine

## 2021-07-29 DIAGNOSIS — H209 Unspecified iridocyclitis: Secondary | ICD-10-CM | POA: Diagnosis not present

## 2021-07-29 DIAGNOSIS — M0600 Rheumatoid arthritis without rheumatoid factor, unspecified site: Secondary | ICD-10-CM | POA: Diagnosis not present

## 2021-07-29 DIAGNOSIS — M2550 Pain in unspecified joint: Secondary | ICD-10-CM | POA: Diagnosis not present

## 2021-07-29 DIAGNOSIS — Z79899 Other long term (current) drug therapy: Secondary | ICD-10-CM | POA: Diagnosis not present

## 2021-07-29 DIAGNOSIS — M545 Low back pain, unspecified: Secondary | ICD-10-CM | POA: Diagnosis not present

## 2021-07-29 DIAGNOSIS — E669 Obesity, unspecified: Secondary | ICD-10-CM | POA: Diagnosis not present

## 2021-07-29 DIAGNOSIS — M159 Polyosteoarthritis, unspecified: Secondary | ICD-10-CM | POA: Diagnosis not present

## 2021-07-29 DIAGNOSIS — Z6831 Body mass index (BMI) 31.0-31.9, adult: Secondary | ICD-10-CM | POA: Diagnosis not present

## 2021-07-30 DIAGNOSIS — H4042X2 Glaucoma secondary to eye inflammation, left eye, moderate stage: Secondary | ICD-10-CM | POA: Diagnosis not present

## 2021-07-30 DIAGNOSIS — H35033 Hypertensive retinopathy, bilateral: Secondary | ICD-10-CM | POA: Diagnosis not present

## 2021-07-30 DIAGNOSIS — H209 Unspecified iridocyclitis: Secondary | ICD-10-CM | POA: Diagnosis not present

## 2021-07-30 DIAGNOSIS — Z961 Presence of intraocular lens: Secondary | ICD-10-CM | POA: Diagnosis not present

## 2021-07-31 ENCOUNTER — Other Ambulatory Visit: Payer: Self-pay

## 2021-07-31 ENCOUNTER — Ambulatory Visit (INDEPENDENT_AMBULATORY_CARE_PROVIDER_SITE_OTHER): Payer: Medicare HMO | Admitting: Physical Therapy

## 2021-07-31 ENCOUNTER — Encounter: Payer: Self-pay | Admitting: Physical Therapy

## 2021-07-31 DIAGNOSIS — M5416 Radiculopathy, lumbar region: Secondary | ICD-10-CM | POA: Diagnosis not present

## 2021-07-31 DIAGNOSIS — M25552 Pain in left hip: Secondary | ICD-10-CM

## 2021-07-31 NOTE — Therapy (Signed)
[facility] [address] [place], [place], [address] Phone: [phone]   Fax:  [phone]  Physical Therapy Treatment  Patient Details  Name: Cheryl Wood MRN: [id number] Date of Birth: 19-Dec-1936 Referring Provider (PT): Erlinda Hong, [place]   Encounter Date: 07/31/2021   PT End of Session - 07/31/21 1152     Visit Number 4    Number of Visits 12    Date for PT Re-Evaluation 08/28/21    Authorization Type [employer] Medicare    PT Start Time 0935    PT Stop Time 1013    PT Time Calculation (min) 38 min    Activity Tolerance Patient tolerated treatment well    Behavior During Therapy [facility] for tasks assessed/performed             Past Medical History:  Diagnosis Date   Allergy    Arthritis    Bleeding of blood vessel    ext. genitalia area   Blood in stool    Chronic kidney disease    Depression    Glaucoma    Heart murmur    History of chicken pox    History of recurrent UTIs    History of stomach ulcers    Hyperlipidemia    Hypertension    Memory loss    Rheumatic fever    Seizures (HCC)    Stroke (HCC)    TIA (transient ischemic attack) 10/22/2013   Urine incontinence     Past Surgical History:  Procedure Laterality Date   ABDOMINAL HYSTERECTOMY     APPENDECTOMY  1983   CATARACT EXTRACTION Right May 13, 2014   EYE SURGERY  12/13/2018   stent inserted in left eye   gum transplant     RIGHT/LEFT HEART CATH AND CORONARY ANGIOGRAPHY N/A 05/12/2018   Procedure: RIGHT/LEFT HEART CATH AND CORONARY ANGIOGRAPHY;  Surgeon: Belva Crome, MD;  Location: [place] [facility];  Service: Cardiovascular;  Laterality: N/A;   THORACIC AORTOGRAM N/A 05/12/2018   Procedure: THORACIC AORTOGRAM;  Surgeon: Belva Crome, MD;  Location: [place] [facility];  Service: Cardiovascular;  Laterality: N/A;   TONSILLECTOMY      There were no vitals filed for this visit.   Subjective Assessment - 07/31/21 1152     Subjective Pt states pain in  L glute much better since last visit. She is having diffiuclty with R eye, did she Dr yesterday. Is back on prednisone for this.    Currently in Pain? No/denies    Pain Score 0-No pain                               [facility] Adult PT Treatment/Exercise - 07/31/21 0001       Lumbar Exercises: Stretches   Lower Trunk Rotation 5 reps;10 seconds    Pelvic Tilt 20 reps      Lumbar Exercises: Supine   Clam 20 reps    Clam Limitations GTB      Lumbar Exercises: Sidelying   Clam 10 reps;Left    Hip Abduction 20 reps;Left      Manual Therapy   Soft tissue mobilization DTM/ TPR to L glute med,  STM/Roller to glute and piriformis.    Manual Traction Long leg distraction for hip and  lumbar pump x 2 min on L;  PT Short Term Goals - 07/23/21 1052       PT SHORT TERM GOAL #1   Title Pt to be independent with initial HEP    Time 2    Period Weeks    Status New    Target Date 07/31/21               PT Long Term Goals - 07/23/21 1052       PT LONG TERM GOAL #1   Title Pt to be independent with final HEP    Time 6    Period Weeks    Status New    Target Date 08/28/21      PT LONG TERM GOAL #2   Title Pt to report decreased pain in back and LE to 0-2/10 with standing activity .    Time 6    Period Weeks    Status New    Target Date 08/28/21      PT LONG TERM GOAL #3   Title Pt to demo ability for standing, walking activity for at least 30 min, without pain in back/LE, to improve ability for IADLS and work duties.    Time 6    Period Weeks    Status New    Target Date 08/28/21      PT LONG TERM GOAL #4   Title Pt to demo improved strength of bil hips to at least 4+/5 to improve stabilty and pain.    Time 6    Period Weeks    Status New    Target Date 08/28/21                   Plan - 07/31/21 1153     Clinical Impression Statement Pt with less pain in hip wiht activity and walking. She has much  improved tenderness in piriformis region since last visit. She does have tenderness and pain in glute med with palpation and TPR today. She has been able to perform strengthening without increased pain but Pt challenged with step ups today, due to strength in L hip, with mild discomfort when performing. Pt to benefit from progression to more standing strengthening as tolerated.    Examination-Activity Limitations Lift;Stand;Locomotion Level;Bend;Stairs    Examination-Participation Restrictions Cleaning;Meal Prep;Yard Work;Community Activity    Stability/Clinical Decision Making Stable/Uncomplicated    Rehab Potential Good    PT Frequency 2x / week    PT Duration 6 weeks    PT Treatment/Interventions ADLs/Self Care Home Management;Cryotherapy;Electrical Stimulation;DME Instruction;Ultrasound;Traction;Moist Heat;Iontophoresis '4mg'$ /ml Dexamethasone;Gait training;Stair training;Functional mobility training;Therapeutic activities;Therapeutic exercise;Balance training;Neuromuscular re-education;Patient/family education;Manual techniques;Passive range of motion;Dry needling;Joint Manipulations;Spinal Manipulations    PT Home Exercise Plan [id number]    Consulted and Agree with Plan of Care Patient             Patient will benefit from skilled therapeutic intervention in order to improve the following deficits and impairments:  Pain, Decreased mobility, Increased muscle spasms, Decreased strength, Decreased range of motion, Decreased activity tolerance  Visit Diagnosis: Radiculopathy, lumbar region  Pain in left hip     Problem List Patient Active Problem List   Diagnosis Date Noted   Lumbar radiculopathy 07/15/2021   Rheumatoid arthritis ([place]) 01/03/2021   Depression, major, single episode, mild ([place]) 09/29/2019   Polyarthralgia 04/21/2019   Diabetes mellitus without complication ([place]) 123XX123   CVA (cerebral vascular accident) ([place]) 01/25/2019   RLS (restless legs syndrome) 01/25/2019    Glaucoma, left eye 01/25/2019   Glaucoma associated with ocular inflammation, left,  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   Nonrheumatic aortic valve insufficiency    CAD in native artery    TIA (transient ischemic attack) 10/22/2013   HTN (hypertension) 10/22/2013   Hyperlipidemia 10/22/2013   Lyndee Hensen, PT, DPT 11:56 AM  07/31/21    [facility] [place] [address] [place], [place], [address] Phone: [phone]   Fax:  [phone]  Name: Cheryl Wood MRN: [id number] Date of Birth: Sep 04, 1936

## 2021-08-04 ENCOUNTER — Ambulatory Visit (INDEPENDENT_AMBULATORY_CARE_PROVIDER_SITE_OTHER): Payer: Medicare HMO | Admitting: Physical Therapy

## 2021-08-04 ENCOUNTER — Encounter: Payer: Self-pay | Admitting: Physical Therapy

## 2021-08-04 ENCOUNTER — Other Ambulatory Visit: Payer: Self-pay

## 2021-08-04 DIAGNOSIS — H4041X1 Glaucoma secondary to eye inflammation, right eye, mild stage: Secondary | ICD-10-CM | POA: Diagnosis not present

## 2021-08-04 DIAGNOSIS — M25552 Pain in left hip: Secondary | ICD-10-CM | POA: Diagnosis not present

## 2021-08-04 DIAGNOSIS — H209 Unspecified iridocyclitis: Secondary | ICD-10-CM | POA: Diagnosis not present

## 2021-08-04 DIAGNOSIS — M5416 Radiculopathy, lumbar region: Secondary | ICD-10-CM | POA: Diagnosis not present

## 2021-08-04 DIAGNOSIS — H4042X2 Glaucoma secondary to eye inflammation, left eye, moderate stage: Secondary | ICD-10-CM | POA: Diagnosis not present

## 2021-08-04 NOTE — Therapy (Signed)
[facility] [address] [place], [place], [address] Phone: [phone]   Fax:  [phone]  Physical Therapy Treatment  Patient Details  Name: Cheryl Wood MRN: [id number] Date of Birth: 11/03/1936 Referring Provider (PT): Erlinda Hong, [place]   Encounter Date: 08/04/2021   PT End of Session - 08/04/21 0933     Visit Number 5    Number of Visits 12    Date for PT Re-Evaluation 08/28/21    Authorization Type [employer]na Medicare    PT Start Time 0927    PT Stop Time 1013    PT Time Calculation (min) 46 min    Activity Tolerance Patient tolerated treatment well    Behavior During Therapy [facility] for tasks assessed/performed             Past Medical History:  Diagnosis Date   Allergy    Arthritis    Bleeding of blood vessel    ext. genitalia area   Blood in stool    Chronic kidney disease    Depression    Glaucoma    Heart murmur    History of chicken pox    History of recurrent UTIs    History of stomach ulcers    Hyperlipidemia    Hypertension    Memory loss    Rheumatic fever    Seizures (HCC)    Stroke (HCC)    TIA (transient ischemic attack) 10/22/2013   Urine incontinence     Past Surgical History:  Procedure Laterality Date   ABDOMINAL HYSTERECTOMY     APPENDECTOMY  1983   CATARACT EXTRACTION Right May 13, 2014   EYE SURGERY  12/13/2018   stent inserted in left eye   gum transplant     RIGHT/LEFT HEART CATH AND CORONARY ANGIOGRAPHY N/A 05/12/2018   Procedure: RIGHT/LEFT HEART CATH AND CORONARY ANGIOGRAPHY;  Surgeon: Belva Crome, MD;  Location: [place] [facility];  Service: Cardiovascular;  Laterality: N/A;   THORACIC AORTOGRAM N/A 05/12/2018   Procedure: THORACIC AORTOGRAM;  Surgeon: Belva Crome, MD;  Location: [place] [facility];  Service: Cardiovascular;  Laterality: N/A;   TONSILLECTOMY      There were no vitals filed for this visit.   Subjective Assessment - 08/04/21 0932     Subjective Pt states glute  going much better, but still rates a 5/10 pain. Still states diffiuclty sleeping on L hip. Stil having diffiuclty with R eye, going back to Dr this afternoon.    Patient Stated Goals decreased pain,    Currently in Pain? Yes    Pain Score 5     Pain Location Hip    Pain Orientation Left    Pain Descriptors / Indicators Aching    Pain Type Acute pain    Pain Onset More than a month ago    Pain Frequency Intermittent                               [facility] Adult PT Treatment/Exercise - 08/04/21 0001       Lumbar Exercises: Stretches   Lower Trunk Rotation 5 reps;10 seconds    Pelvic Tilt 20 reps      Lumbar Exercises: Aerobic   Recumbent Bike L1 x 8 min      Lumbar Exercises: Standing   Other Standing Lumbar Exercises Standing march, hip abd , mini squats 2x10 bil;      Lumbar Exercises: Seated  Sit to Stand 10 reps      Lumbar Exercises: Supine   Clam 20 reps    Clam Limitations GTB      Lumbar Exercises: Sidelying   Clam 10 reps;Left    Hip Abduction 20 reps;Left      Manual Therapy   Joint Mobilization hip inf mobs gr 3;    Soft tissue mobilization TPR/STM with tennis ball to L glute med and piriformis,    Manual Traction Long leg distraction for hip and  lumbar pump x 2 min on L;                      PT Short Term Goals - 08/04/21 0934       PT SHORT TERM GOAL #1   Title Pt to be independent with initial HEP    Time 2    Period Weeks    Status Achieved    Target Date 07/31/21               PT Long Term Goals - 07/23/21 1052       PT LONG TERM GOAL #1   Title Pt to be independent with final HEP    Time 6    Period Weeks    Status New    Target Date 08/28/21      PT LONG TERM GOAL #2   Title Pt to report decreased pain in back and LE to 0-2/10 with standing activity .    Time 6    Period Weeks    Status New    Target Date 08/28/21      PT LONG TERM GOAL #3   Title Pt to demo ability for standing, walking  activity for at least 30 min, without pain in back/LE, to improve ability for IADLS and work duties.    Time 6    Period Weeks    Status New    Target Date 08/28/21      PT LONG TERM GOAL #4   Title Pt to demo improved strength of bil hips to at least 4+/5 to improve stabilty and pain.    Time 6    Period Weeks    Status New    Target Date 08/28/21                   Plan - 08/04/21 1005     Clinical Impression Statement Most pain in glute med > piriformis today. Pt with significant tenderness with palpation, and continues to have pain with walking, and sleeping. Pt overall with less pain, and doing well with strengthening, but continues to have bothersome pain and will benefit from continued care.    Examination-Activity Limitations Lift;Stand;Locomotion Level;Bend;Stairs    Examination-Participation Restrictions Cleaning;Meal Prep;Yard Work;Community Activity    Stability/Clinical Decision Making Stable/Uncomplicated    Rehab Potential Good    PT Frequency 2x / week    PT Duration 6 weeks    PT Treatment/Interventions ADLs/Self Care Home Management;Cryotherapy;Electrical Stimulation;DME Instruction;Ultrasound;Traction;Moist Heat;Iontophoresis '4mg'$ /ml Dexamethasone;Gait training;Stair training;Functional mobility training;Therapeutic activities;Therapeutic exercise;Balance training;Neuromuscular re-education;Patient/family education;Manual techniques;Passive range of motion;Dry needling;Joint Manipulations;Spinal Manipulations    PT Home Exercise Plan [id number]    Consulted and Agree with Plan of Care Patient             Patient will benefit from skilled therapeutic intervention in order to improve the following deficits and impairments:  Pain, Decreased mobility, Increased muscle spasms, Decreased strength, Decreased range of motion, Decreased activity tolerance  Visit Diagnosis: Radiculopathy, lumbar region  Pain in left hip     Problem List Patient Active  Problem List   Diagnosis Date Noted   Lumbar radiculopathy 07/15/2021   Rheumatoid arthritis ([place]) 01/03/2021   Depression, major, single episode, mild ([place]) 09/29/2019   Polyarthralgia 04/21/2019   Diabetes mellitus without complication ([place]) 123XX123   CVA (cerebral vascular accident) ([place]) 01/25/2019   RLS (restless legs syndrome) 01/25/2019   Glaucoma, left eye 01/25/2019   Glaucoma associated with ocular inflammation, left, 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   Nonrheumatic aortic valve insufficiency    CAD in native artery    TIA (transient ischemic attack) 10/22/2013   HTN (hypertension) 10/22/2013   Hyperlipidemia 10/22/2013    Lyndee Hensen, PT, DPT 10:22 AM  08/04/21    [facility] [place], [place], [address] Phone: [phone]   Fax:  [phone]  Name: MARYUM SLAVEY MRN: [id number] Date of Birth: 17-Apr-1936

## 2021-08-05 DIAGNOSIS — H209 Unspecified iridocyclitis: Secondary | ICD-10-CM | POA: Insufficient documentation

## 2021-08-06 ENCOUNTER — Ambulatory Visit (INDEPENDENT_AMBULATORY_CARE_PROVIDER_SITE_OTHER): Payer: Medicare HMO | Admitting: Physical Therapy

## 2021-08-06 ENCOUNTER — Encounter: Payer: Medicare HMO | Admitting: Physical Therapy

## 2021-08-06 ENCOUNTER — Other Ambulatory Visit: Payer: Self-pay | Admitting: Family Medicine

## 2021-08-06 ENCOUNTER — Other Ambulatory Visit: Payer: Self-pay

## 2021-08-06 ENCOUNTER — Encounter: Payer: Self-pay | Admitting: Physical Therapy

## 2021-08-06 DIAGNOSIS — M25552 Pain in left hip: Secondary | ICD-10-CM

## 2021-08-06 DIAGNOSIS — M5416 Radiculopathy, lumbar region: Secondary | ICD-10-CM

## 2021-08-06 DIAGNOSIS — Z79899 Other long term (current) drug therapy: Secondary | ICD-10-CM | POA: Diagnosis not present

## 2021-08-06 DIAGNOSIS — R5383 Other fatigue: Secondary | ICD-10-CM | POA: Diagnosis not present

## 2021-08-06 DIAGNOSIS — Z111 Encounter for screening for respiratory tuberculosis: Secondary | ICD-10-CM | POA: Diagnosis not present

## 2021-08-06 DIAGNOSIS — M0600 Rheumatoid arthritis without rheumatoid factor, unspecified site: Secondary | ICD-10-CM | POA: Diagnosis not present

## 2021-08-06 NOTE — Therapy (Signed)
[facility] [address] [place], [place], [address] Phone: [phone]   Fax:  [phone]  Physical Therapy Treatment  Patient Details  Name: Cheryl Wood MRN: [id number] Date of Birth: 20-Apr-1936 Referring Provider (PT): Erlinda Hong, [place]   Encounter Date: 08/06/2021   PT End of Session - 08/06/21 1608     Visit Number 6    Number of Visits 12    Date for PT Re-Evaluation 08/28/21    Authorization Type [employer] Medicare    PT Start Time 1559    PT Stop Time 1638    PT Time Calculation (min) 39 min    Activity Tolerance Patient tolerated treatment well    Behavior During Therapy [facility] for tasks assessed/performed             Past Medical History:  Diagnosis Date   Allergy    Arthritis    Bleeding of blood vessel    ext. genitalia area   Blood in stool    Chronic kidney disease    Depression    Glaucoma    Heart murmur    History of chicken pox    History of recurrent UTIs    History of stomach ulcers    Hyperlipidemia    Hypertension    Memory loss    Rheumatic fever    Seizures (HCC)    Stroke (HCC)    TIA (transient ischemic attack) 10/22/2013   Urine incontinence     Past Surgical History:  Procedure Laterality Date   ABDOMINAL HYSTERECTOMY     APPENDECTOMY  1983   CATARACT EXTRACTION Right May 13, 2014   EYE SURGERY  12/13/2018   stent inserted in left eye   gum transplant     RIGHT/LEFT HEART CATH AND CORONARY ANGIOGRAPHY N/A 05/12/2018   Procedure: RIGHT/LEFT HEART CATH AND CORONARY ANGIOGRAPHY;  Surgeon: Belva Crome, MD;  Location: [facility];  Service: Cardiovascular;  Laterality: N/A;   THORACIC AORTOGRAM N/A 05/12/2018   Procedure: THORACIC AORTOGRAM;  Surgeon: Belva Crome, MD;  Location: [facility];  Service: Cardiovascular;  Laterality: N/A;   TONSILLECTOMY      There were no vitals filed for this visit.   Subjective Assessment - 08/06/21 1608     Subjective Pt staets  improving pain, but was sore yesterday and today. Eye is starting to do better.    Currently in Pain? Yes    Pain Score 3     Pain Location Hip    Pain Orientation Left    Pain Descriptors / Indicators Aching    Pain Type Acute pain    Pain Onset More than a month ago    Pain Frequency Intermittent                               [facility] Adult PT Treatment/Exercise - 08/06/21 0001       Lumbar Exercises: Stretches   Lower Trunk Rotation 5 reps;10 seconds    Piriformis Stretch 3 reps;30 seconds    Piriformis Stretch Limitations supine fig 4,      Lumbar Exercises: Aerobic   Recumbent Bike --      Lumbar Exercises: Standing   Row 20 reps    Theraband Level (Row) Level 2 (Red)    Other Standing Lumbar Exercises Standing march, hip abd , mini squats 2x10 bil;      Lumbar Exercises: Seated  Sit to Stand 10 reps      Lumbar Exercises: Supine   Clam 20 reps    Clam Limitations GTB    Other Supine Lumbar Exercises osteoporosis heel press 5 sec x 10 bil;      Lumbar Exercises: Sidelying   Clam Left;15 reps    Hip Abduction 20 reps;Left      Manual Therapy   Joint Mobilization hip inf mobs gr 3;    Soft tissue mobilization DTM/IASTM with tennis ball to L glute med and piriformis,    Manual Traction Long leg distraction for hip and  lumbar pump x 2 min on L;                      PT Short Term Goals - 08/04/21 0934       PT SHORT TERM GOAL #1   Title Pt to be independent with initial HEP    Time 2    Period Weeks    Status Achieved    Target Date 07/31/21               PT Long Term Goals - 07/23/21 1052       PT LONG TERM GOAL #1   Title Pt to be independent with final HEP    Time 6    Period Weeks    Status New    Target Date 08/28/21      PT LONG TERM GOAL #2   Title Pt to report decreased pain in back and LE to 0-2/10 with standing activity .    Time 6    Period Weeks    Status New    Target Date 08/28/21      PT  LONG TERM GOAL #3   Title Pt to demo ability for standing, walking activity for at least 30 min, without pain in back/LE, to improve ability for IADLS and work duties.    Time 6    Period Weeks    Status New    Target Date 08/28/21      PT LONG TERM GOAL #4   Title Pt to demo improved strength of bil hips to at least 4+/5 to improve stabilty and pain.    Time 6    Period Weeks    Status New    Target Date 08/28/21                   Plan - 08/06/21 1642     Clinical Impression Statement Pt with some improvments in pain in glute med after manual today. Still has bothersome tenderness in glute/piriformis with deep palpation. Pain levels variable, and less overall than previous weeks. Pt doing well with strengthening , and will benefit from continued care for continued strength and pain relief techniques.Pain mostly in hip, but could also be stemming from lumbar compression, due to degree of scoliosis that pt has.    Examination-Activity Limitations Lift;Stand;Locomotion Level;Bend;Stairs    Examination-Participation Restrictions Cleaning;Meal Prep;Yard Work;Community Activity    Stability/Clinical Decision Making Stable/Uncomplicated    Rehab Potential Good    PT Frequency 2x / week    PT Duration 6 weeks    PT Treatment/Interventions ADLs/Self Care Home Management;Cryotherapy;Electrical Stimulation;DME Instruction;Ultrasound;Traction;Moist Heat;Iontophoresis '4mg'$ /ml Dexamethasone;Gait training;Stair training;Functional mobility training;Therapeutic activities;Therapeutic exercise;Balance training;Neuromuscular re-education;Patient/family education;Manual techniques;Passive range of motion;Dry needling;Joint Manipulations;Spinal Manipulations    PT Home Exercise Plan [id number]    Consulted and Agree with Plan of Care Patient  Patient will benefit from skilled therapeutic intervention in order to improve the following deficits and impairments:  Pain, Decreased  mobility, Increased muscle spasms, Decreased strength, Decreased range of motion, Decreased activity tolerance  Visit Diagnosis: Radiculopathy, lumbar region  Pain in left hip     Problem List Patient Active Problem List   Diagnosis Date Noted   Lumbar radiculopathy 07/15/2021   Rheumatoid arthritis ([place]) 01/03/2021   Depression, major, single episode, mild ([place]) 09/29/2019   Polyarthralgia 04/21/2019   Diabetes mellitus without complication ([place]) [id number]   CVA (cerebral vascular accident) ([place]) 01/25/2019   RLS (restless legs syndrome) 01/25/2019   Glaucoma, left eye 01/25/2019   Glaucoma associated with ocular inflammation, left, 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   Nonrheumatic aortic valve insufficiency    CAD in native artery    TIA (transient ischemic attack) 10/22/2013   HTN (hypertension) 10/22/2013   Hyperlipidemia 10/22/2013   Lyndee Hensen, PT, DPT 4:45 PM  08/06/21    [facility] [address] [place], [place], [address] Phone: [phone]   Fax:  [phone]  Name: Cheryl Wood MRN: [id number] Date of Birth: 1936-01-21

## 2021-08-06 NOTE — Telephone Encounter (Signed)
Pt scheduled for TOC on 9/14 with you. Requesting refill.

## 2021-08-11 ENCOUNTER — Other Ambulatory Visit: Payer: Self-pay

## 2021-08-11 ENCOUNTER — Encounter: Payer: Self-pay | Admitting: Physical Therapy

## 2021-08-11 ENCOUNTER — Ambulatory Visit (INDEPENDENT_AMBULATORY_CARE_PROVIDER_SITE_OTHER): Payer: Medicare HMO | Admitting: Physical Therapy

## 2021-08-11 DIAGNOSIS — M25552 Pain in left hip: Secondary | ICD-10-CM | POA: Diagnosis not present

## 2021-08-11 DIAGNOSIS — M5416 Radiculopathy, lumbar region: Secondary | ICD-10-CM | POA: Diagnosis not present

## 2021-08-11 NOTE — Therapy (Signed)
[facility] [address] [place], [place], [address] Phone: [phone]   Fax:  [phone]  Physical Therapy Treatment  Patient Details  Name: Cheryl Wood MRN: [id number] Date of Birth: Sep 15, 1936 Referring Provider (PT): Erlinda Hong, [place]   Encounter Date: 08/11/2021   PT End of Session - 08/11/21 1018     Visit Number 7    Number of Visits 12    Date for PT Re-Evaluation 08/28/21    Authorization Type [employer]na Medicare    PT Start Time 1019    PT Stop Time 1103    PT Time Calculation (min) 44 min    Activity Tolerance Patient tolerated treatment well    Behavior During Therapy [facility] for tasks assessed/performed             Past Medical History:  Diagnosis Date   Allergy    Arthritis    Bleeding of blood vessel    ext. genitalia area   Blood in stool    Chronic kidney disease    Depression    Glaucoma    Heart murmur    History of chicken pox    History of recurrent UTIs    History of stomach ulcers    Hyperlipidemia    Hypertension    Memory loss    Rheumatic fever    Seizures (HCC)    Stroke (HCC)    TIA (transient ischemic attack) 10/22/2013   Urine incontinence     Past Surgical History:  Procedure Laterality Date   ABDOMINAL HYSTERECTOMY     APPENDECTOMY  1983   CATARACT EXTRACTION Right May 13, 2014   EYE SURGERY  12/13/2018   stent inserted in left eye   gum transplant     RIGHT/LEFT HEART CATH AND CORONARY ANGIOGRAPHY N/A 05/12/2018   Procedure: RIGHT/LEFT HEART CATH AND CORONARY ANGIOGRAPHY;  Surgeon: Belva Crome, MD;  Location: [facility];  Service: Cardiovascular;  Laterality: N/A;   THORACIC AORTOGRAM N/A 05/12/2018   Procedure: THORACIC AORTOGRAM;  Surgeon: Belva Crome, MD;  Location: [facility];  Service: Cardiovascular;  Laterality: N/A;   TONSILLECTOMY      There were no vitals filed for this visit.   Subjective Assessment - 08/11/21 1021     Subjective Patient  reporting no pain today.    Pertinent History Scoliosis, CVA, TIA, DM,    Limitations Standing;House hold activities;Walking    Patient Stated Goals decreased pain,    Currently in Pain? No/denies                               [facility] Adult PT Treatment/Exercise - 08/11/21 0001       Self-Care   Self-Care Other Self-Care Comments    Other Self-Care Comments  discussed +/- of back braces for upper body posture      Lumbar Exercises: Stretches   Prone on Elbows Stretch 1 rep;10 seconds    Press Ups 3 reps    Press Ups Limitations too difficult on arms    Other Lumbar Stretch Exercise Rt QL stretch in SDLY x 90 sec      Lumbar Exercises: Aerobic   Recumbent Bike L1 x 9.5 min      Lumbar Exercises: Standing   Lifting Limitations Worked on dead lift form hands sliding on thighs to knees, chest up x 10    Other Standing Lumbar Exercises Standing march with RTB,  hip abd with RTB , mini squats 2x10 with RTB;    Other Standing Lumbar Exercises hip ext with elbows at 90 deg on wall dropping hips forward x 10      Lumbar Exercises: Seated   Sit to Stand 10 reps      Manual Therapy   Manual Therapy Myofascial release    Myofascial Release in SDLY Right QL release and TPR to right gluteals    Manual Traction --                      PT Short Term Goals - 08/04/21 0934       PT SHORT TERM GOAL #1   Title Pt to be independent with initial HEP    Time 2    Period Weeks    Status Achieved    Target Date 07/31/21               PT Long Term Goals - 07/23/21 1052       PT LONG TERM GOAL #1   Title Pt to be independent with final HEP    Time 6    Period Weeks    Status New    Target Date 08/28/21      PT LONG TERM GOAL #2   Title Pt to report decreased pain in back and LE to 0-2/10 with standing activity .    Time 6    Period Weeks    Status New    Target Date 08/28/21      PT LONG TERM GOAL #3   Title Pt to demo ability for standing,  walking activity for at least 30 min, without pain in back/LE, to improve ability for IADLS and work duties.    Time 6    Period Weeks    Status New    Target Date 08/28/21      PT LONG TERM GOAL #4   Title Pt to demo improved strength of bil hips to at least 4+/5 to improve stabilty and pain.    Time 6    Period Weeks    Status New    Target Date 08/28/21                   Plan - 08/11/21 1104     Clinical Impression Statement Pt reporting continued improvements. Pain is better but standing CV endurance is still bad. She tolerated increased time on bike today without difficulty. She experienced decreased hot/cold sensation when getting pedicure recently. Plan to assess next visit. Pt also would like to have DN again as this has helped in the past. She is still very tight in right lumbar and gluteals with many TPs. Good response to manual and QL stretch today.    PT Treatment/Interventions ADLs/Self Care Home Management;Cryotherapy;Electrical Stimulation;DME Instruction;Ultrasound;Traction;Moist Heat;Iontophoresis '4mg'$ /ml Dexamethasone;Gait training;Stair training;Functional mobility training;Therapeutic activities;Therapeutic exercise;Balance training;Neuromuscular re-education;Patient/family education;Manual techniques;Passive range of motion;Dry needling;Joint Manipulations;Spinal Manipulations    PT Next Visit Plan DN, test sensation, assess QL stretch    PT Home Exercise Plan [id number]             Patient will benefit from skilled therapeutic intervention in order to improve the following deficits and impairments:  Pain, Decreased mobility, Increased muscle spasms, Decreased strength, Decreased range of motion, Decreased activity tolerance  Visit Diagnosis: Radiculopathy, lumbar region  Pain in left hip     Problem List Patient Active Problem List   Diagnosis Date Noted   Lumbar radiculopathy  07/15/2021   Rheumatoid arthritis ([place]) 01/03/2021   Depression,  major, single episode, mild ([place]) 09/29/2019   Polyarthralgia 04/21/2019   Diabetes mellitus without complication ([place]) [id number]   CVA (cerebral vascular accident) ([place]) 01/25/2019   RLS (restless legs syndrome) 01/25/2019   Glaucoma, left eye 01/25/2019   Glaucoma associated with ocular inflammation, left, 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   Nonrheumatic aortic valve insufficiency    CAD in native artery    TIA (transient ischemic attack) 10/22/2013   HTN (hypertension) 10/22/2013   Hyperlipidemia 10/22/2013    Madelyn Flavors PT 08/11/2021, 12:35 PM  [facility] [place], [place], [address] Phone: [phone]   Fax:  [phone]  Name: Cheryl Wood MRN: [id number] Date of Birth: 1936/08/20

## 2021-08-14 ENCOUNTER — Other Ambulatory Visit: Payer: Self-pay

## 2021-08-14 ENCOUNTER — Ambulatory Visit (INDEPENDENT_AMBULATORY_CARE_PROVIDER_SITE_OTHER): Payer: Medicare HMO | Admitting: Physical Therapy

## 2021-08-14 ENCOUNTER — Encounter: Payer: Self-pay | Admitting: Physical Therapy

## 2021-08-14 ENCOUNTER — Encounter: Payer: Self-pay | Admitting: *Deleted

## 2021-08-14 DIAGNOSIS — M5416 Radiculopathy, lumbar region: Secondary | ICD-10-CM | POA: Diagnosis not present

## 2021-08-14 DIAGNOSIS — M25552 Pain in left hip: Secondary | ICD-10-CM

## 2021-08-14 DIAGNOSIS — M479 Spondylosis, unspecified: Secondary | ICD-10-CM

## 2021-08-14 NOTE — Progress Notes (Signed)
Asked by the front desk to triage pt since she was  voiced that she is experiencing some dizziness and expressed that she had a low BP yesterday when she checked. I checked pt's BP was 142/72, no signs of distress, no fever. She stated that this being going on for couple of months now and she decided to check her BP and blood sugar yesterday and BP was 105/62 which is lower for what she usually is. Fasting blood sugar was 200. Patient said she just want to mentioned to someone today, but she said she feels much better today. [name] had physical therapy section this morning, asked her if she felt dizzy during the section, she said no, she felt fine.  Scheduled pt to be seen in clinic tomorrow at 9:00 with [name], advised pt to go to UC or ER if the dizziness got worse this evening, I also asked her to check her blood sugar 2 hours after meal and fasting tomorrow and bring those numbers with her to the visit. Pt also said that she needs refill for the glucometer strips, per chart review, they are not in the medication list. This can be handled tomorrow at visit time.

## 2021-08-14 NOTE — Therapy (Addendum)
[facility] [address] [place], [place], [address] Phone: [phone]   Fax:  [phone]  Physical Therapy Treatment  Patient Details  Name: Cheryl Wood MRN: [id number] Date of Birth: 1936/02/27 Referring Provider (PT): Erlinda Hong, [place]   Encounter Date: 08/14/2021   PT End of Session - 08/14/21 1010     Visit Number 8    Number of Visits 12    Date for PT Re-Evaluation 08/28/21    Authorization Type [employer]na Medicare    PT Start Time 9211    PT Stop Time 1107    PT Time Calculation (min) 52 min    Activity Tolerance Patient tolerated treatment well    Behavior During Therapy [facility] for tasks assessed/performed             Past Medical History:  Diagnosis Date   Allergy    Arthritis    Bleeding of blood vessel    ext. genitalia area   Blood in stool    Chronic kidney disease    Depression    Glaucoma    Heart murmur    History of chicken pox    History of recurrent UTIs    History of stomach ulcers    Hyperlipidemia    Hypertension    Memory loss    Rheumatic fever    Seizures (HCC)    Stroke (HCC)    TIA (transient ischemic attack) 10/22/2013   Urine incontinence     Past Surgical History:  Procedure Laterality Date   ABDOMINAL HYSTERECTOMY     APPENDECTOMY  1983   CATARACT EXTRACTION Right May 13, 2014   EYE SURGERY  12/13/2018   stent inserted in left eye   gum transplant     RIGHT/LEFT HEART CATH AND CORONARY ANGIOGRAPHY N/A 05/12/2018   Procedure: RIGHT/LEFT HEART CATH AND CORONARY ANGIOGRAPHY;  Surgeon: Belva Crome, MD;  Location: [facility];  Service: Cardiovascular;  Laterality: N/A;   THORACIC AORTOGRAM N/A 05/12/2018   Procedure: THORACIC AORTOGRAM;  Surgeon: Belva Crome, MD;  Location: [facility];  Service: Cardiovascular;  Laterality: N/A;   TONSILLECTOMY      There were no vitals filed for this visit.   Subjective Assessment - 08/14/21 1011     Subjective This is the best  I've felt in a long time.    Pertinent History Scoliosis, CVA, TIA, DM,    Limitations Standing;House hold activities;Walking    Patient Stated Goals decreased pain,    Currently in Pain? No/denies                [facility] PT Assessment - 08/14/21 0001       Sensation   Light Touch Appears Intact    Hot/Cold Appears Intact    Additional Comments sharp/dull - decreased in L4 dermatome bil                           [facility] Adult PT Treatment/Exercise - 08/14/21 0001       Self-Care   Self-Care Other Self-Care Comments    Other Self-Care Comments  discussion of BP, blood sugar and diet      Lumbar Exercises: Stretches   ITB Stretch Left;2 reps;30 seconds    ITB Stretch Limitations for low back stretch at wall with left leg behind right; left side to wall    Other Lumbar Stretch Exercise seated with left ankle on right knee; SB right  for low back stretch 2x30sec      Modalities   Modalities Moist Heat      Moist Heat Therapy   Number Minutes Moist Heat 10 Minutes    Moist Heat Location Hip      Manual Therapy   Manual Therapy Soft tissue mobilization    Manual therapy comments skilled palpation and monitoring of soft tissue with dry needling.    Soft tissue mobilization left gluteals and piriformis              Trigger Point Dry Needling - 08/14/21 0001     Consent Given? Yes    Education Handout Provided Previously provided    Muscles Treated Back/Hip Gluteus minimus;Gluteus medius;Gluteus maximus;Piriformis    Dry Needling Comments left    Gluteus Minimus Response Twitch response elicited;Palpable increased muscle length    Gluteus Medius Response Twitch response elicited;Palpable increased muscle length    Gluteus Maximus Response Twitch response elicited;Palpable increased muscle length    Piriformis Response Palpable increased muscle length                  PT Education - 08/14/21 1103     Education Details HEP; discussion of BP,  blood sugar and monitoring of vitals and diet before upcoming MD appt.    Person(s) Educated Patient    Methods Explanation;Demonstration;Handout    Comprehension Verbalized understanding;Returned demonstration              PT Short Term Goals - 08/04/21 0934       PT SHORT TERM GOAL #1   Title Pt to be independent with initial HEP    Time 2    Period Weeks    Status Achieved    Target Date 07/31/21               PT Long Term Goals - 08/14/21 1707       PT LONG TERM GOAL #1   Title Pt to be independent with final HEP    Status Partially Met      PT LONG TERM GOAL #5   Title -                   Plan - 08/14/21 1702     Clinical Impression Statement Patient presents with no pain today reporting this is the best she has felt in a long time. Sensation was assessed based on reports of deficits and sharp sensation in L4 dermatome was absent bil. Hot/cold was normal, however pt advised to be mindful of hot/cold surfaces. We also discussed her concerns with her BP and blood sugar levels and PT sent a note to [name] Alwardt PA-C. Patient reporting some balance issues even getting out of the car for this visit.  We did a trial of DN in her left gluteals and piriformis today at patient's request with good response. She had marked TPs throughout. Patient will benefit from continued strengthening and an assessment of balance.    PT Frequency 2x / week    PT Duration 6 weeks    PT Treatment/Interventions ADLs/Self Care Home Management;Cryotherapy;Electrical Stimulation;DME Instruction;Ultrasound;Traction;Moist Heat;Iontophoresis 2m/ml Dexamethasone;Gait training;Stair training;Functional mobility training;Therapeutic activities;Therapeutic exercise;Balance training;Neuromuscular re-education;Patient/family education;Manual techniques;Passive range of motion;Dry needling;Joint Manipulations;Spinal Manipulations    PT Next Visit Plan assess balance    PT Home Exercise Plan  [email or web address]   Consulted and Agree with Plan of Care Patient             Patient will benefit from  skilled therapeutic intervention in order to improve the following deficits and impairments:  Pain, Decreased mobility, Increased muscle spasms, Decreased strength, Decreased range of motion, Decreased activity tolerance  Visit Diagnosis: Radiculopathy, lumbar region  Pain in left hip  Advanced osteoarthritis of spine     Problem List Patient Active Problem List   Diagnosis Date Noted   Lumbar radiculopathy 07/15/2021   Rheumatoid arthritis ([place]) 01/03/2021   Depression, major, single episode, mild ([place]) 09/29/2019   Polyarthralgia 04/21/2019   Diabetes mellitus without complication ([place]) 66/44/0347   CVA (cerebral vascular accident) ([place]) 01/25/2019   RLS (restless legs syndrome) 01/25/2019   Glaucoma, left eye 01/25/2019   Glaucoma associated with ocular inflammation, left, 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   Nonrheumatic aortic valve insufficiency    CAD in native artery    TIA (transient ischemic attack) 10/22/2013   HTN (hypertension) 10/22/2013   Hyperlipidemia 10/22/2013   Madelyn Flavors PT 08/14/2021, 5:11 PM  [facility] [address] [place], [place], [address] Phone: [phone]   Fax:  [phone]  Name: Cheryl Wood MRN: [id number] Date of Birth: 04-28-1936    PHYSICAL THERAPY DISCHARGE SUMMARY  Visits from Start of Care: 8 Plan: Patient agrees to discharge.  Patient goals were partially met. Patient is being discharged due to - not returning since last visit.    Lyndee Hensen, PT, DPT 12:32 PM  02/09/22

## 2021-08-14 NOTE — Patient Instructions (Signed)
Access Code: [email or web address] URL: [email or web address] Date: 08/14/2021 Prepared by: Almyra Free  Exercises Single Knee to Chest Stretch - 2 x daily - 3 reps - 30 hold Supine Piriformis Stretch with Leg Straight - 2 x daily - 3 reps - 30 hold Supine Piriformis Stretch Pulling Heel to Hip - 2 x daily - 3 reps - 30 hold Supine Lower Trunk Rotation - 2 x daily - 10 reps - 5 hold Clamshell - 1 x daily - 2 sets - 10 reps Straight Leg Raise - 1 x daily - 1 sets - 10 reps ITB Stretch at Wall - 2 x daily - 7 x weekly - 1 sets - 3 reps - 30 sec hold

## 2021-08-15 ENCOUNTER — Ambulatory Visit (INDEPENDENT_AMBULATORY_CARE_PROVIDER_SITE_OTHER): Payer: Medicare HMO | Admitting: Physician Assistant

## 2021-08-15 ENCOUNTER — Encounter: Payer: Self-pay | Admitting: Physician Assistant

## 2021-08-15 VITALS — BP 116/78 | HR 86 | Temp 97.6°F | Ht 61.0 in | Wt 170.0 lb

## 2021-08-15 DIAGNOSIS — I10 Essential (primary) hypertension: Secondary | ICD-10-CM | POA: Diagnosis not present

## 2021-08-15 DIAGNOSIS — E1165 Type 2 diabetes mellitus with hyperglycemia: Secondary | ICD-10-CM

## 2021-08-15 LAB — POCT GLYCOSYLATED HEMOGLOBIN (HGB A1C): Hemoglobin A1C: 8.1 % — AB (ref 4.0–5.6)

## 2021-08-15 LAB — GLUCOSE, POCT (MANUAL RESULT ENTRY): POC Glucose: 140 mg/dl — AB (ref 70–99)

## 2021-08-15 MED ORDER — LANCETS MISC: 0 refills | Status: AC

## 2021-08-15 MED ORDER — CONTOUR NEXT TEST VI STRP: ORAL_STRIP | 12 refills | Status: AC

## 2021-08-15 MED ORDER — DAPAGLIFLOZIN PROPANEDIOL 5 MG PO TABS
5.0000 mg | ORAL_TABLET | Freq: Every day | ORAL | 0 refills | Status: DC
Start: 2021-08-15 — End: 2021-09-15

## 2021-08-15 NOTE — Progress Notes (Signed)
Established Patient Office Visit  Subjective:  Patient ID: Cheryl Wood, female    DOB: 06/25/1936  Age: 85 y.o. MRN: [id number]  CC:  Chief Complaint  Patient presents with   Hypertension    HPI Cheryl Wood presents for concerns about her blood pressure and blood sugar. She has a history of diabetes mellitus and hypertension.  Her last visit with PCP was on 01/03/2021 and she was controlled with diet and exercise without medication for her diabetes.  Her A1c at that time was 6.9.  She was almost to goal with her blood pressure as well and she is currently taking losartan 100 mg daily.  She is currently in physical therapy for lumbar radiculopathy and pain in her left hip.  She had her last session yesterday and talked with one of our nurses in the office because she was experiencing some dizziness.  At that time her blood pressure was 142/72 and she did not have any signs of distress.  She stated that at home her blood pressure had been 105/62 and her fasting blood sugar was 200.  She was feeling dizzy during the session but by the time she was leaving she was feeling better.  Her fasting sugar at 4:41 this morning was 214.  Nonfasting sugar at 8:15 this morning was 141.  Still having some fuzzy/blurred vision this morning in both eyes. She does have an ophthalmologist and is scheduled to see them next week.  No headaches or dizziness. No weakness. No chest pain or SOB. She does have some dry mouth and uses Biotene. Denies any urinary changes.  Remicade infusion every 8 weeks for RA. States this was recently increased in dose, but otherwise no changes to her medical history.  Past Medical History:  Diagnosis Date   Allergy    Arthritis    Bleeding of blood vessel    ext. genitalia area   Blood in stool    Chronic kidney disease    Depression    Glaucoma    Heart murmur    History of chicken pox    History of recurrent UTIs    History of stomach ulcers    Hyperlipidemia     Hypertension    Memory loss    Rheumatic fever    Seizures (HCC)    Stroke (HCC)    TIA (transient ischemic attack) 10/22/2013   Urine incontinence     Past Surgical History:  Procedure Laterality Date   ABDOMINAL HYSTERECTOMY     APPENDECTOMY  1983   CATARACT EXTRACTION Right May 13, 2014   EYE SURGERY  12/13/2018   stent inserted in left eye   gum transplant     RIGHT/LEFT HEART CATH AND CORONARY ANGIOGRAPHY N/A 05/12/2018   Procedure: RIGHT/LEFT HEART CATH AND CORONARY ANGIOGRAPHY;  Surgeon: Belva Crome, MD;  Location: [facility];  Service: Cardiovascular;  Laterality: N/A;   THORACIC AORTOGRAM N/A 05/12/2018   Procedure: THORACIC AORTOGRAM;  Surgeon: Belva Crome, MD;  Location: [facility];  Service: Cardiovascular;  Laterality: N/A;   TONSILLECTOMY      Family History  Problem Relation Age of Onset   Cancer Mother    Early death Mother    Heart disease Father    Arthritis Father    Early death Father    Hearing loss Father    Hypertension Father    Hyperlipidemia Father    Cancer Sister    Arthritis Sister  Arthritis Brother    Diabetes Brother    Heart attack Brother    Heart disease Brother    Hyperlipidemia Brother    Hypertension Brother    Diabetes Daughter    Heart disease Daughter    Hyperlipidemia Daughter    Hyperlipidemia Maternal Grandmother    Hypertension Maternal Grandmother    Stroke Maternal Grandmother    Alcohol abuse Maternal Grandfather    Early death Maternal Grandfather        tuberculosis   Arthritis Paternal Grandmother    Alcohol abuse Paternal Grandfather    Heart disease Paternal Grandfather    Hyperlipidemia Paternal Grandfather    Hypertension Paternal Grandfather    Stroke Paternal Grandfather    Alcohol abuse Sister    COPD Sister    Drug abuse Sister    Early death Sister    Heart disease Sister    Hypertension Sister    Hyperlipidemia Sister    Early death Brother    Early death Brother      Social History   Socioeconomic History   Marital status: Divorced    Spouse name: Not on file   Number of children: 2   Years of education: college   Highest education level: Not on file  Occupational History    Employer: RETIRED  Tobacco Use   Smoking status: Former    Years: 40.00    Types: Cigarettes    Quit date: 11/05/1971    Years since quitting: 49.8   Smokeless tobacco: Never  Vaping Use   Vaping Use: Never used  Substance and Sexual Activity   Alcohol use: No    Alcohol/week: 0.0 standard drinks   Drug use: No   Sexual activity: Not Currently  Other Topics Concern   Not on file  Social History Narrative   Patient lives at home alone   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   Worked at [employer] for 18 years   Daughters in [place] and in [place]   Caffeine Use: 2-3 cups daily   Social Determinants of Health   Financial Resource Strain: Low Risk    Difficulty of Paying Living Expenses: Not hard at all  Food Insecurity: No Food Insecurity   Worried About Charity fundraiser in the Last Year: Never true   Arboriculturist in the Last Year: Never true  Transportation Needs: No Transportation Needs   Lack of Transportation (Medical): No   Lack of Transportation (Non-Medical): No  Physical Activity: Inactive   Days of Exercise per Week: 0 days   Minutes of Exercise per Session: 0 min  Stress: No Stress Concern Present   Feeling of Stress : Not at all  Social Connections: Moderately Isolated   Frequency of Communication with Friends and Family: More than three times a week   Frequency of Social Gatherings with Friends and Family: More than three times a week   Attends Religious Services: More than 4 times per year   Active Member of [employer] or Organizations: No   Attends Archivist Meetings: Never   Marital Status: Divorced  Human resources officer Violence: Not At Risk   Fear of Current or Ex-Partner: No   Emotionally  Abused: No   Physically Abused: No   Sexually Abused: No    Outpatient Medications Prior to Visit  Medication Sig Dispense Refill   acetaminophen (TYLENOL) 650 MG CR tablet Take 650 mg by mouth every 8 (eight)  hours as needed for pain.     b complex vitamins capsule Take 1 capsule by mouth daily.     brimonidine (ALPHAGAN) 0.2 % ophthalmic solution Place 1 drop into both eyes 2 (two) times daily.     clopidogrel (PLAVIX) 75 MG tablet TAKE ONE TABLET BY MOUTH DAILY 90 tablet 3   estradiol (ESTRACE) 0.1 MG/GM vaginal cream Place 0.5 g vaginally 2 (two) times a week. Place 0.5g twice a week 30 g 11   famciclovir (FAMVIR) 500 MG tablet Take 500 mg by mouth daily.      famotidine (PEPCID) 20 MG tablet TAKE ONE TABLET BY MOUTH TWICE A DAY 180 tablet 2   furosemide (LASIX) 80 MG tablet TAKE TWO TABLETS BY MOUTH DAILY 180 tablet 2   inFLIXimab (REMICADE) 100 MG injection Inject 100 mg into the vein every 8 (eight) weeks.     KLOR-CON M20 20 MEQ tablet TAKE ONE TABLET BY MOUTH DAILY (Patient taking differently: Take 20 mEq by mouth daily.) 90 tablet 2   loratadine (CLARITIN) 10 MG tablet Take 10 mg by mouth daily.     losartan (COZAAR) 100 MG tablet TAKE ONE TABLET BY MOUTH DAILY (Patient taking differently: Take 100 mg by mouth daily.) 90 tablet 3   lovastatin (MEVACOR) 20 MG tablet TAKE ONE TABLET BY MOUTH EVERY EVENING (Patient taking differently: Take 20 mg by mouth at bedtime.) 90 tablet 3   Multiple Vitamins-Minerals (CENTRUM SILVER 50+WOMEN PO) Take 1 tablet by mouth daily.     pramipexole (MIRAPEX) 0.25 MG tablet TAKE ONE TABLET BY MOUTH EVERY NIGHT AT BEDTIME 90 tablet 1   prednisoLONE acetate (PRED FORTE) 1 % ophthalmic suspension Place 1 drop into the right eye 4 (four) times daily.     predniSONE (STERAPRED UNI-PAK 21 TAB) 10 MG (21) TBPK tablet Take as directed 21 tablet 0   sertraline (ZOLOFT) 25 MG tablet TAKE ONE TABLET BY MOUTH DAILY (Patient taking differently: Take 25 mg by mouth at  bedtime.) 90 tablet 3   Vibegron 75 MG TABS Take 1 tablet by mouth daily. 30 tablet 5   No facility-administered medications prior to visit.    Allergies  Allergen Reactions   Dorzolamide Hcl-Timolol Mal Other (See Comments)    Red eyes   Tetanus Toxoids Swelling    Arm was twice the size it should be   Hctz [Hydrochlorothiazide] Rash    ROS Review of Systems REFER TO HPI FOR PERTINENT POSITIVES AND NEGATIVES    Objective:    Physical Exam Vitals and nursing note reviewed.  Constitutional:      General: She is not in acute distress.    Appearance: Normal appearance. She is normal weight.  HENT:     Head: Normocephalic.     Right Ear: External ear normal.     Left Ear: External ear normal.     Nose: Nose normal.     Mouth/Throat:     Mouth: Mucous membranes are moist.  Eyes:     Extraocular Movements: Extraocular movements intact.     Conjunctiva/sclera: Conjunctivae normal.     Pupils: Pupils are equal, round, and reactive to light.  Cardiovascular:     Rate and Rhythm: Normal rate and regular rhythm.     Pulses: Normal pulses.     Heart sounds: No murmur heard. Pulmonary:     Effort: Pulmonary effort is normal.     Breath sounds: Normal breath sounds.  Abdominal:     General: Abdomen is flat.  Bowel sounds are normal.     Palpations: Abdomen is soft.     Tenderness: There is no abdominal tenderness.  Musculoskeletal:        General: Normal range of motion.     Cervical back: Normal range of motion.  Skin:    General: Skin is warm.  Neurological:     General: No focal deficit present.     Mental Status: She is alert and oriented to person, place, and time.     Gait: Gait normal.  Psychiatric:        Mood and Affect: Mood normal.        Behavior: Behavior normal.    BP 116/78   Pulse 86   Temp 97.6 F (36.4 C)   Ht '5\' 1"'$  (1.549 m)   Wt 170 lb (77.1 kg)   LMP  (LMP Unknown)   SpO2 94%   BMI 32.12 kg/m  Wt Readings from Last 3 Encounters:   08/15/21 170 lb (77.1 kg)  07/15/21 173 lb (78.5 kg)  07/02/21 173 lb 6.4 oz (78.7 kg)     Health Maintenance Due  Topic Date Due   Zoster Vaccines- Shingrix (1 of 2) Never done   FOOT EXAM  10/31/2020   MAMMOGRAM  03/25/2021   COVID-19 Vaccine (5 - Booster for Moderna series) 05/31/2021   HEMOGLOBIN A1C  07/03/2021   INFLUENZA VACCINE  07/28/2021    There are no preventive care reminders to display for this patient.  Lab Results  Component Value Date   TSH 1.82 11/29/2019   Lab Results  Component Value Date   WBC 6.3 03/28/2021   HGB 13.7 03/28/2021   HCT 41.5 03/28/2021   MCV 97.4 03/28/2021   PLT 162 03/28/2021   Lab Results  Component Value Date   NA 139 04/03/2021   K 3.9 04/03/2021   CO2 31 04/03/2021   GLUCOSE 117 (H) 04/03/2021   BUN 34 (H) 04/03/2021   CREATININE 1.07 04/03/2021   BILITOT 1.5 (H) 03/28/2021   ALKPHOS 57 03/28/2021   AST 22 03/28/2021   ALT 20 03/28/2021   PROT 7.1 03/28/2021   ALBUMIN 4.3 03/28/2021   CALCIUM 9.9 04/03/2021   ANIONGAP 11 03/28/2021   GFR 48.23 (L) 04/03/2021   Lab Results  Component Value Date   CHOL 205 (H) 07/03/2020   Lab Results  Component Value Date   HDL 49.50 07/03/2020   Lab Results  Component Value Date   LDLCALC 132 (H) 07/03/2020   Lab Results  Component Value Date   TRIG 118.0 07/03/2020   Lab Results  Component Value Date   CHOLHDL 4 07/03/2020   Lab Results  Component Value Date   HGBA1C 6.9 (H) 01/03/2021      Assessment & Plan:   Problem List Items Addressed This Visit   None Visit Diagnoses     Type 2 diabetes mellitus with hyperglycemia, without long-term current use of insulin (HCC)    -  Primary   Relevant Medications   dapagliflozin propanediol (FARXIGA) 5 MG TABS tablet   Other Relevant Orders   POCT glucose (manual entry) (Completed)   POCT HgB A1C   Essential hypertension           Meds ordered this encounter  Medications   dapagliflozin propanediol  (FARXIGA) 5 MG TABS tablet    Sig: Take 1 tablet (5 mg total) by mouth daily before breakfast.    Dispense:  30 tablet    Refill:  0  glucose blood (CONTOUR NEXT TEST) test strip    Sig: Use as instructed    Dispense:  100 each    Refill:  12   Lancets MISC    Sig: Check blood sugar once daily. Contour Next Device.    Dispense:  100 each    Refill:  0     Follow-up: No follow-ups on file.   1. Type 2 diabetes mellitus with hyperglycemia, without long-term current use of insulin (HCC) -Today's Ha1c is 8.1, which is higher than it has ever been for her and she is very concerned. -Will start on Farxiga 5 mg at this time. SE discussed.  -Refilled her lancets & strips -She can check her sugar intermittently, but not necessary to check daily as she is not on insulin -Still diet and exercise important  2. Essential hypertension -BP at goal, looks good today -Will continue to monitor -Stay on Cozaar 100 mg daily at this time -Limit salt -Call if any problems    Shyhiem Beeney M Pearlina Friedly, PA-C

## 2021-08-15 NOTE — Patient Instructions (Signed)
Good to meet you today! Your Hemoglobin A1c was 8.1. We will start on Farxiga 5 mg at this time to help with blood sugar control. See you back next month as scheduled.  Bring log of blood pressures and intermittent glucose levels. Call sooner if you are having any issues.  Keep f/up with eye doctor as scheduled.

## 2021-08-19 DIAGNOSIS — Z961 Presence of intraocular lens: Secondary | ICD-10-CM | POA: Diagnosis not present

## 2021-08-19 DIAGNOSIS — H35033 Hypertensive retinopathy, bilateral: Secondary | ICD-10-CM | POA: Diagnosis not present

## 2021-08-19 DIAGNOSIS — H4042X2 Glaucoma secondary to eye inflammation, left eye, moderate stage: Secondary | ICD-10-CM | POA: Diagnosis not present

## 2021-08-19 DIAGNOSIS — H209 Unspecified iridocyclitis: Secondary | ICD-10-CM | POA: Diagnosis not present

## 2021-08-20 ENCOUNTER — Other Ambulatory Visit: Payer: Self-pay

## 2021-08-20 ENCOUNTER — Ambulatory Visit (INDEPENDENT_AMBULATORY_CARE_PROVIDER_SITE_OTHER): Payer: Medicare HMO | Admitting: Physician Assistant

## 2021-08-20 ENCOUNTER — Encounter: Payer: Self-pay | Admitting: Physician Assistant

## 2021-08-20 ENCOUNTER — Encounter: Payer: Self-pay | Admitting: Obstetrics and Gynecology

## 2021-08-20 ENCOUNTER — Ambulatory Visit (INDEPENDENT_AMBULATORY_CARE_PROVIDER_SITE_OTHER): Payer: Medicare HMO | Admitting: Obstetrics and Gynecology

## 2021-08-20 ENCOUNTER — Ambulatory Visit (INDEPENDENT_AMBULATORY_CARE_PROVIDER_SITE_OTHER)
Admission: RE | Admit: 2021-08-20 | Discharge: 2021-08-20 | Disposition: A | Discharge status: Home / Self Care | Payer: Medicare HMO | Source: Ambulatory Visit | Attending: Physician Assistant | Admitting: Physician Assistant

## 2021-08-20 VITALS — BP 149/80 | HR 71 | Temp 97.2°F | Ht 61.0 in | Wt 169.6 lb

## 2021-08-20 VITALS — BP 110/72 | HR 75 | Wt 168.0 lb

## 2021-08-20 DIAGNOSIS — I051 Rheumatic mitral insufficiency: Secondary | ICD-10-CM

## 2021-08-20 DIAGNOSIS — R0600 Dyspnea, unspecified: Secondary | ICD-10-CM | POA: Diagnosis not present

## 2021-08-20 DIAGNOSIS — N393 Stress incontinence (female) (male): Secondary | ICD-10-CM

## 2021-08-20 DIAGNOSIS — N3281 Overactive bladder: Secondary | ICD-10-CM | POA: Diagnosis not present

## 2021-08-20 DIAGNOSIS — R0609 Other forms of dyspnea: Secondary | ICD-10-CM

## 2021-08-20 DIAGNOSIS — I872 Venous insufficiency (chronic) (peripheral): Secondary | ICD-10-CM

## 2021-08-20 DIAGNOSIS — I10 Essential (primary) hypertension: Secondary | ICD-10-CM

## 2021-08-20 DIAGNOSIS — I351 Nonrheumatic aortic (valve) insufficiency: Secondary | ICD-10-CM

## 2021-08-20 DIAGNOSIS — R0602 Shortness of breath: Secondary | ICD-10-CM | POA: Diagnosis not present

## 2021-08-20 MED ORDER — VIBEGRON 75 MG PO TABS: 1.0000 | ORAL_TABLET | Freq: Every day | ORAL | 11 refills | Status: DC

## 2021-08-20 NOTE — Progress Notes (Signed)
Acute Office Visit  Subjective:    Patient ID: Cheryl Wood, female    DOB: [birth year], 85 y.o.   MRN: [id number]  Chief Complaint  Patient presents with   Shortness of Breath    On exertion. Worsening 2-3 months ago. She state that others have noticed. She denies chest pain, headache, or dizziness.     Shortness of Breath  Patient is in today for SOB with exertion worsening x 2-3 months. Pt says that other people have noticed it longer than that though. She says she just thought this was always her "normal" and it's not until recently that her supervisor and PT encouraged her to get this checked out.   No changes in the last few days. She does feel short of breath with sitting, but definitely worse with exertion. No dizziness or lightheadedness. No chest pain or pressure. No headaches. She does have some chronic lower extremity edema and brown color to her legs.   Rheumatic fever at ages 15 and 77. Never has had much stamina since then. No hx of lung issues. 10 year history of smoking in her 54s, usually about 1/2 ppd. No hx of PE. No hx of asthma or COPD. Hx CVA in 2007.  Past Medical History:  Diagnosis Date   Allergy    Arthritis    Bleeding of blood vessel    ext. genitalia area   Blood in stool    Chronic kidney disease    Depression    Glaucoma    Heart murmur    History of chicken pox    History of recurrent UTIs    History of stomach ulcers    Hyperlipidemia    Hypertension    Memory loss    Rheumatic fever    Seizures (HCC)    Stroke (HCC)    TIA (transient ischemic attack) 10/22/2013   Urine incontinence     Past Surgical History:  Procedure Laterality Date   ABDOMINAL HYSTERECTOMY     APPENDECTOMY  1983   CATARACT EXTRACTION Right May 13, 2014   EYE SURGERY  12/13/2018   stent inserted in left eye   gum transplant     RIGHT/LEFT HEART CATH AND CORONARY ANGIOGRAPHY N/A 05/12/2018   Procedure: RIGHT/LEFT HEART CATH AND CORONARY ANGIOGRAPHY;  Surgeon:  Belva Crome, MD;  Location: [facility];  Service: Cardiovascular;  Laterality: N/A;   THORACIC AORTOGRAM N/A 05/12/2018   Procedure: THORACIC AORTOGRAM;  Surgeon: Belva Crome, MD;  Location: [facility];  Service: Cardiovascular;  Laterality: N/A;   TONSILLECTOMY      Family History  Problem Relation Age of Onset   Cancer Mother    Early death Mother    Heart disease Father    Arthritis Father    Early death Father    Hearing loss Father    Hypertension Father    Hyperlipidemia Father    Cancer Sister    Arthritis Sister    Arthritis Brother    Diabetes Brother    Heart attack Brother    Heart disease Brother    Hyperlipidemia Brother    Hypertension Brother    Diabetes Daughter    Heart disease Daughter    Hyperlipidemia Daughter    Hyperlipidemia Maternal Grandmother    Hypertension Maternal Grandmother    Stroke Maternal Grandmother    Alcohol abuse Maternal Grandfather    Early death Maternal Grandfather        tuberculosis  Arthritis Paternal Grandmother    Alcohol abuse Paternal Grandfather    Heart disease Paternal Grandfather    Hyperlipidemia Paternal Grandfather    Hypertension Paternal Grandfather    Stroke Paternal Grandfather    Alcohol abuse Sister    COPD Sister    Drug abuse Sister    Early death Sister    Heart disease Sister    Hypertension Sister    Hyperlipidemia Sister    Early death Brother    Early death Brother     Social History   Socioeconomic History   Marital status: Divorced    Spouse name: Not on file   Number of children: 2   Years of education: college   Highest education level: Not on file  Occupational History    Employer: RETIRED  Tobacco Use   Smoking status: Former    Years: 40.00    Types: Cigarettes    Quit date: 11/05/1971    Years since quitting: 49.8   Smokeless tobacco: Never  Vaping Use   Vaping Use: Never used  Substance and Sexual Activity   Alcohol use: No    Alcohol/week: 0.0  standard drinks   Drug use: No   Sexual activity: Not Currently  Other Topics Concern   Not on file  Social History Narrative   Patient lives at home alone   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   Worked at [employer] for 18 years   Daughters in [place] and in [place]   Caffeine Use: 2-3 cups daily   Social Determinants of Health   Financial Resource Strain: Low Risk    Difficulty of Paying Living Expenses: Not hard at all  Food Insecurity: No Food Insecurity   Worried About Charity fundraiser in the Last Year: Never true   Arboriculturist in the Last Year: Never true  Transportation Needs: No Transportation Needs   Lack of Transportation (Medical): No   Lack of Transportation (Non-Medical): No  Physical Activity: Inactive   Days of Exercise per Week: 0 days   Minutes of Exercise per Session: 0 min  Stress: No Stress Concern Present   Feeling of Stress : Not at all  Social Connections: Moderately Isolated   Frequency of Communication with Friends and Family: More than three times a week   Frequency of Social Gatherings with Friends and Family: More than three times a week   Attends Religious Services: More than 4 times per year   Active Member of [employer] or Organizations: No   Attends Archivist Meetings: Never   Marital Status: Divorced  Human resources officer Violence: Not At Risk   Fear of Current or Ex-Partner: No   Emotionally Abused: No   Physically Abused: No   Sexually Abused: No    Outpatient Medications Prior to Visit  Medication Sig Dispense Refill   acetaminophen (TYLENOL) 650 MG CR tablet Take 650 mg by mouth every 8 (eight) hours as needed for pain.     b complex vitamins capsule Take 1 capsule by mouth daily.     brimonidine (ALPHAGAN) 0.2 % ophthalmic solution Place 1 drop into both eyes 2 (two) times daily.     clopidogrel (PLAVIX) 75 MG tablet TAKE ONE TABLET BY MOUTH DAILY 90 tablet 3   dapagliflozin propanediol  (FARXIGA) 5 MG TABS tablet Take 1 tablet (5 mg total) by mouth daily before breakfast. 30 tablet 0   estradiol (ESTRACE) 0.1 MG/GM vaginal  cream Place 0.5 g vaginally 2 (two) times a week. Place 0.5g twice a week 30 g 11   famciclovir (FAMVIR) 500 MG tablet Take 500 mg by mouth daily.      famotidine (PEPCID) 20 MG tablet TAKE ONE TABLET BY MOUTH TWICE A DAY 180 tablet 2   furosemide (LASIX) 80 MG tablet TAKE TWO TABLETS BY MOUTH DAILY 180 tablet 2   glucose blood (CONTOUR NEXT TEST) test strip Use as instructed 100 each 12   inFLIXimab (REMICADE) 100 MG injection Inject 100 mg into the vein every 8 (eight) weeks.     KLOR-CON M20 20 MEQ tablet TAKE ONE TABLET BY MOUTH DAILY (Patient taking differently: Take 20 mEq by mouth daily.) 90 tablet 2   Lancets MISC Check blood sugar once daily. Contour Next Device. 100 each 0   loratadine (CLARITIN) 10 MG tablet Take 10 mg by mouth daily.     losartan (COZAAR) 100 MG tablet TAKE ONE TABLET BY MOUTH DAILY (Patient taking differently: Take 100 mg by mouth daily.) 90 tablet 3   lovastatin (MEVACOR) 20 MG tablet TAKE ONE TABLET BY MOUTH EVERY EVENING (Patient taking differently: Take 20 mg by mouth at bedtime.) 90 tablet 3   Multiple Vitamins-Minerals (CENTRUM SILVER 50+WOMEN PO) Take 1 tablet by mouth daily.     pramipexole (MIRAPEX) 0.25 MG tablet TAKE ONE TABLET BY MOUTH EVERY NIGHT AT BEDTIME 90 tablet 1   prednisoLONE acetate (PRED FORTE) 1 % ophthalmic suspension Place 1 drop into the right eye 4 (four) times daily.     predniSONE (STERAPRED UNI-PAK 21 TAB) 10 MG (21) TBPK tablet Take as directed 21 tablet 0   sertraline (ZOLOFT) 25 MG tablet TAKE ONE TABLET BY MOUTH DAILY (Patient taking differently: Take 25 mg by mouth at bedtime.) 90 tablet 3   Vibegron 75 MG TABS Take 1 tablet by mouth daily. 30 tablet 11   No facility-administered medications prior to visit.    Allergies  Allergen Reactions   Dorzolamide Hcl-Timolol Mal Other (See Comments)     Red eyes   Tetanus Toxoids Swelling    Arm was twice the size it should be   Hctz [Hydrochlorothiazide] Rash    Review of Systems  Respiratory:  Positive for shortness of breath.   REFER TO HPI FOR PERTINENT POSITIVES AND NEGATIVES     Objective:    Physical Exam Vitals and nursing note reviewed.  Constitutional:      General: She is not in acute distress.    Appearance: Normal appearance. She is normal weight.  HENT:     Head: Normocephalic.     Right Ear: External ear normal.     Left Ear: External ear normal.     Nose: Nose normal.     Mouth/Throat:     Mouth: Mucous membranes are moist.  Eyes:     Extraocular Movements: Extraocular movements intact.     Conjunctiva/sclera: Conjunctivae normal.     Pupils: Pupils are equal, round, and reactive to light.  Cardiovascular:     Rate and Rhythm: Normal rate and regular rhythm.     Pulses: Normal pulses.     Heart sounds: No murmur heard.    Comments: Brawny discoloration lower extremities Pulmonary:     Effort: Pulmonary effort is normal.     Breath sounds: Normal breath sounds.  Abdominal:     General: Abdomen is flat. Bowel sounds are normal.     Palpations: Abdomen is soft.  Tenderness: There is no abdominal tenderness.  Musculoskeletal:        General: Normal range of motion.     Cervical back: Normal range of motion.     Right lower leg: 1+ Pitting Edema present.     Left lower leg: 1+ Pitting Edema present.  Skin:    General: Skin is warm.  Neurological:     General: No focal deficit present.     Mental Status: She is alert and oriented to person, place, and time.     Gait: Gait normal.  Psychiatric:        Mood and Affect: Mood normal.        Behavior: Behavior normal.    BP (!) 149/80   Pulse 71   Temp (!) 97.2 F (36.2 C) (Temporal)   Ht '5\' 1"'$  (1.549 m)   Wt 169 lb 9.6 oz (76.9 kg)   LMP  (LMP Unknown)   SpO2 96%   BMI 32.05 kg/m  Wt Readings from Last 3 Encounters:  08/20/21 169 lb  9.6 oz (76.9 kg)  08/20/21 168 lb (76.2 kg)  08/15/21 170 lb (77.1 kg)    Health Maintenance Due  Topic Date Due   Zoster Vaccines- Shingrix (1 of 2) Never done   FOOT EXAM  10/31/2020   MAMMOGRAM  03/25/2021   INFLUENZA VACCINE  07/28/2021    There are no preventive care reminders to display for this patient.   Lab Results  Component Value Date   TSH 1.82 11/29/2019   Lab Results  Component Value Date   WBC 6.3 03/28/2021   HGB 13.7 03/28/2021   HCT 41.5 03/28/2021   MCV 97.4 03/28/2021   PLT 162 03/28/2021   Lab Results  Component Value Date   NA 139 04/03/2021   K 3.9 04/03/2021   CO2 31 04/03/2021   GLUCOSE 117 (H) 04/03/2021   BUN 34 (H) 04/03/2021   CREATININE 1.07 04/03/2021   BILITOT 1.5 (H) 03/28/2021   ALKPHOS 57 03/28/2021   AST 22 03/28/2021   ALT 20 03/28/2021   PROT 7.1 03/28/2021   ALBUMIN 4.3 03/28/2021   CALCIUM 9.9 04/03/2021   ANIONGAP 11 03/28/2021   GFR 48.23 (L) 04/03/2021   Lab Results  Component Value Date   CHOL 205 (H) 07/03/2020   Lab Results  Component Value Date   HDL 49.50 07/03/2020   Lab Results  Component Value Date   LDLCALC 132 (H) 07/03/2020   Lab Results  Component Value Date   TRIG 118.0 07/03/2020   Lab Results  Component Value Date   CHOLHDL 4 07/03/2020   Lab Results  Component Value Date   HGBA1C 8.1 (A) 08/15/2021       Assessment & Plan:   Problem List Items Addressed This Visit       Cardiovascular and Mediastinum   Nonrheumatic aortic valve insufficiency   Relevant Orders   EKG 12-Lead (Completed)   Ambulatory referral to Cardiology   Other Visit Diagnoses     Dyspnea on exertion    -  Primary   Relevant Orders   EKG 12-Lead (Completed)   DG Chest 2 View   Ambulatory referral to Cardiology   Essential hypertension       Relevant Orders   EKG 12-Lead (Completed)   Ambulatory referral to Cardiology   Chronic venous insufficiency       Relevant Orders   EKG 12-Lead (Completed)    Ambulatory referral to Cardiology   Rheumatic mitral  regurgitation       Relevant Orders   EKG 12-Lead (Completed)   Ambulatory referral to Cardiology      PLAN: Patient tells me that she has never seen a cardiologist or pulmonologist before.  Upon further review of her chart though, there are notes from Dr. Landry Corporal about unexplained dyspnea.  She had a cardiac cath done on 05/12/2018 which did show RCA luminal irregularities.  Left main was normal.  Mid LAD contained irregularities up to 50% following the first diagonal.  Also noted trace mild aortic regurgitation.  Her ejection fraction was 65%.  Also noted to reveal 1+ mitral valve regurgitation.    As symptoms have been noticeably different per patient since her emergency department visit in April of this year, I did repeat an EKG in office today.  I did not see any noticeable changes from the EKG on 03/31/2021.  I also advised patient to get a repeat chest x-ray and she will go to the [facility] for this.  As her vitals are stable and she is not having any acute symptoms, I recommend outpatient follow-up with cardiology.  She requested to see Dr. Gwenlyn Found.  Pending that visit, she may need to consult with pulmonology as well.  She knows there is a very low threshold to present to the emergency department if she has sudden worsening or change of symptoms.  Elize Pinon M Nevah Dalal, PA-C

## 2021-08-20 NOTE — Progress Notes (Signed)
[facility]   Subjective:     Chief Complaint:  Chief Complaint  Patient presents with   Pessary Check    History of Present Illness: Cheryl Wood is a 85 y.o. female with stress incontinence and OAB who presents for a pessary check. She is using a size #0 incontinence ring pessary. The pessary has been working well and she has no complaints. She is using vaginal estrogen cream twice a week, mostly on the labia. She denies vaginal bleeding. Has some discharge on her pad every day.   Currently on Gemtesa '75mg'$  for overactive bladder. Not having accidents regularly. Tries to urinate around every 2 hours to make sure she doesn't wait too long.   Past Medical History: Patient  has a past medical history of Allergy, Arthritis, Bleeding of blood vessel, Blood in stool, Chronic kidney disease, Depression, Glaucoma, Heart murmur, History of chicken pox, History of recurrent UTIs, History of stomach ulcers, Hyperlipidemia, Hypertension, Memory loss, Rheumatic fever, Seizures ([place]), Stroke ([place]), TIA (transient ischemic attack) (10/22/2013), and Urine incontinence.   Past Surgical History: She  has a past surgical history that includes Abdominal hysterectomy; gum transplant; Cataract extraction (Right, May 13, 2014); RIGHT/LEFT HEART CATH AND CORONARY ANGIOGRAPHY (N/A, 05/12/2018); THORACIC AORTOGRAM (N/A, 05/12/2018); Eye surgery (12/13/2018); Appendectomy (1983); and Tonsillectomy.   Medications: She has a current medication list which includes the following prescription(s): acetaminophen, b complex vitamins, brimonidine, clopidogrel, dapagliflozin propanediol, estradiol, famciclovir, famotidine, furosemide, contour next test, remicade, klor-con m20, lancets, loratadine, losartan, lovastatin, multiple vitamins-minerals, pramipexole, prednisolone acetate, prednisone, sertraline, and vibegron.   Allergies: Patient is allergic to dorzolamide hcl-timolol mal, tetanus toxoids, and hctz  [hydrochlorothiazide].   Social History: Patient  reports that she quit smoking about 49 years ago. Her smoking use included cigarettes. She has never used smokeless tobacco. She reports that she does not drink alcohol and does not use drugs.      Objective:    Physical Exam: BP 110/72   Pulse 75   Wt 168 lb (76.2 kg)   LMP  (LMP Unknown)   BMI 31.74 kg/m  Gen: No apparent distress, A&O x 3.  Detailed Urogynecologic Evaluation:  Pelvic Exam: Normal external female genitalia; Bartholin's and Skene's glands normal in appearance; urethral meatus normal in appearance, no urethral masses or discharge. The pessary was noted to be in place. It was removed and cleaned. Speculum exam revealed no lesions in the vagina. The pessary was replaced. It was comfortable to the patient and fit well.   POP-Q (03/26/21):    POP-Q   -2                                            Aa   -2                                           Ba   -6                                              C    1.5  Gh   3                                            Pb   6.5                                            tvl    -1.5                                            Ap   -1.5                                            Bp                                                  D         Assessment/Plan:    Assessment: [name] is a 85 y.o. with stress incontinence and OAB here for a pessary check.   Plan:  SUI - Continue #0 incontinence ring pessary. She will keep the pessary in place until next visit.  - Continue estrogen cream twice a week. Can place at introitus/ in vagina as well as inner labia.  OAB - Continue with Gemtesa '75mg'$  daily.   Follow up 3 months.   Time spent: I spent 20 minutes dedicated to the care of this patient on the date of this encounter to include pre-visit review of records, face-to-face time with the patient and post visit documentation.

## 2021-08-20 NOTE — Patient Instructions (Signed)
Very good to see you again today.  I have sent a referral to Dr. Alvester Chou cardiologist.  I would also like for you to get a chest x-ray done at your convenience.  Should you have sudden worsening symptoms or any chest pain, please report to the nearest emergency department.  Continue your regular medications and continue to monitor your blood pressure at home.

## 2021-08-28 ENCOUNTER — Emergency Department (HOSPITAL_COMMUNITY): Payer: Medicare HMO

## 2021-08-28 ENCOUNTER — Other Ambulatory Visit: Payer: Self-pay

## 2021-08-28 ENCOUNTER — Encounter (HOSPITAL_COMMUNITY): Payer: Self-pay | Admitting: *Deleted

## 2021-08-28 ENCOUNTER — Emergency Department (HOSPITAL_COMMUNITY)
Admission: EM | Admit: 2021-08-28 | Discharge: 2021-08-28 | Disposition: A | Discharge status: Home / Self Care | Payer: Medicare HMO | Attending: Emergency Medicine | Admitting: Emergency Medicine

## 2021-08-28 ENCOUNTER — Telehealth: Payer: Self-pay

## 2021-08-28 DIAGNOSIS — Z87891 Personal history of nicotine dependence: Secondary | ICD-10-CM | POA: Insufficient documentation

## 2021-08-28 DIAGNOSIS — N189 Chronic kidney disease, unspecified: Secondary | ICD-10-CM | POA: Insufficient documentation

## 2021-08-28 DIAGNOSIS — R0602 Shortness of breath: Secondary | ICD-10-CM

## 2021-08-28 DIAGNOSIS — Z79899 Other long term (current) drug therapy: Secondary | ICD-10-CM | POA: Diagnosis not present

## 2021-08-28 DIAGNOSIS — I2510 Atherosclerotic heart disease of native coronary artery without angina pectoris: Secondary | ICD-10-CM | POA: Insufficient documentation

## 2021-08-28 DIAGNOSIS — I129 Hypertensive chronic kidney disease with stage 1 through stage 4 chronic kidney disease, or unspecified chronic kidney disease: Secondary | ICD-10-CM | POA: Diagnosis not present

## 2021-08-28 DIAGNOSIS — Z7902 Long term (current) use of antithrombotics/antiplatelets: Secondary | ICD-10-CM | POA: Insufficient documentation

## 2021-08-28 DIAGNOSIS — E1122 Type 2 diabetes mellitus with diabetic chronic kidney disease: Secondary | ICD-10-CM | POA: Insufficient documentation

## 2021-08-28 DIAGNOSIS — R0600 Dyspnea, unspecified: Secondary | ICD-10-CM | POA: Diagnosis not present

## 2021-08-28 LAB — CBC WITH DIFFERENTIAL/PLATELET
Abs Immature Granulocytes: 0.12 10*3/uL — ABNORMAL HIGH (ref 0.00–0.07)
Basophils Absolute: 0 10*3/uL (ref 0.0–0.1)
Basophils Relative: 0 %
Eosinophils Absolute: 0.1 10*3/uL (ref 0.0–0.5)
Eosinophils Relative: 1 %
HCT: 39.7 % (ref 36.0–46.0)
Hemoglobin: 13.3 g/dL (ref 12.0–15.0)
Immature Granulocytes: 1 %
Lymphocytes Relative: 28 %
Lymphs Abs: 2.4 10*3/uL (ref 0.7–4.0)
MCH: 33.2 pg (ref 26.0–34.0)
MCHC: 33.5 g/dL (ref 30.0–36.0)
MCV: 99 fL (ref 80.0–100.0)
Monocytes Absolute: 0.8 10*3/uL (ref 0.1–1.0)
Monocytes Relative: 9 %
Neutro Abs: 5.4 10*3/uL (ref 1.7–7.7)
Neutrophils Relative %: 61 %
Platelets: 124 10*3/uL — ABNORMAL LOW (ref 150–400)
RBC: 4.01 MIL/uL (ref 3.87–5.11)
RDW: 14 % (ref 11.5–15.5)
WBC: 8.8 10*3/uL (ref 4.0–10.5)
nRBC: 0 % (ref 0.0–0.2)

## 2021-08-28 LAB — BASIC METABOLIC PANEL
Anion gap: 9 (ref 5–15)
BUN: 24 mg/dL — ABNORMAL HIGH (ref 8–23)
CO2: 26 mmol/L (ref 22–32)
Calcium: 9.1 mg/dL (ref 8.9–10.3)
Chloride: 106 mmol/L (ref 98–111)
Creatinine, Ser: 1.06 mg/dL — ABNORMAL HIGH (ref 0.44–1.00)
GFR, Estimated: 52 mL/min — ABNORMAL LOW (ref >60)
Glucose, Bld: 209 mg/dL — ABNORMAL HIGH (ref 70–99)
Potassium: 3.6 mmol/L (ref 3.5–5.1)
Sodium: 141 mmol/L (ref 135–145)

## 2021-08-28 LAB — TROPONIN I (HIGH SENSITIVITY)
Troponin I (High Sensitivity): 19 ng/L — ABNORMAL HIGH (ref <18)
Troponin I (High Sensitivity): 18 ng/L — ABNORMAL HIGH (ref <18)

## 2021-08-28 LAB — D-DIMER, QUANTITATIVE: D-Dimer, Quant: 0.47 ug/mL-FEU (ref 0.00–0.50)

## 2021-08-28 LAB — BRAIN NATRIURETIC PEPTIDE: B Natriuretic Peptide: 97.8 pg/mL (ref 0.0–100.0)

## 2021-08-28 NOTE — Telephone Encounter (Signed)
Nurse Assessment Nurse: Ysidro Evert, RN, Levada Dy Date/Time (Eastern Time): 08/28/2021 8:50:58 AM Confirm and document reason for call. If symptomatic, describe symptoms. ---Caller states she is having shortness of breath that is worsening this morning. No other symptoms Does the patient have any new or worsening symptoms? ---Yes Will a triage be completed? ---Yes Related visit to physician within the last 2 weeks? ---No Does the PT have any chronic conditions? (i.e. diabetes, asthma, this includes High risk factors for pregnancy, etc.) ---Yes List chronic conditions. ---diabetes Is this a behavioral health or substance abuse call? ---No Guidelines Guideline Title Affirmed Question Affirmed Notes Nurse Date/Time (Eastern Time) Breathing Difficulty [1] MODERATE difficulty breathing (e.g., speaks in phrases, SOB even at rest, pulse 100-120) AND [2] NEW-onset or WORSE than normal Ysidro Evert, RN, Levada Dy 08/28/2021 8:52:21 AM PLEASE NOTE: All timestamps contained within this report are represented as [place] Standard Time. CONFIDENTIALTY NOTICE: This fax transmission is intended only for the addressee. It contains information that is legally privileged, confidential or otherwise protected from use or disclosure. If you are not the intended recipient, you are strictly prohibited from reviewing, disclosing, copying using or disseminating any of this information or taking any action in reliance on or regarding this information. If you have received this fax in error, please notify us immediately by telephone so that we can arrange for its return to [place]. Phone: [phone], Toll-Free: [phone], Fax: [phone] Page: 2 of 2 Call Id: [id number] [place]. Time Eilene Ghazi Time) Disposition Final User 08/28/2021 8:48:40 AM Send to Urgent Queue Kathlynn Grate 08/28/2021 8:54:50 AM Go to ED Now Yes Ysidro Evert, RN, Marin Shutter Disagree/Comply Comply Caller Understands Yes PreDisposition Did not know what to do Care  Advice Given Per Guideline GO TO ED NOW: * You need to be seen in the Emergency Department. * Go to the ED at ___________ [place] now. Drive carefully. NOTE TO TRIAGER - DRIVING: * Another adult should drive. CALL EMS 911 IF: * Call EMS if you become worse. CARE ADVICE given per Breathing Difficulty (Adult) guideline. Referrals [facility] - ED

## 2021-08-28 NOTE — ED Provider Notes (Signed)
[facility] EMERGENCY DEPARTMENT Provider Note   CSN: [id number] Arrival date & time: 08/28/21  0937     History Chief Complaint  Patient presents with   Shortness of [place] is a 85 y.o. female.  HPI Patient arrives with her daughter who provides additional historical details. Patient has multiple medical issues including episodic dyspnea.  It is this which brings her here today.  She notes that she has seen her primary care physician multiple times, has been referred to cardiology, scheduled for follow-up next month. She has had an echocardiogram, as recently as several months ago, and takes Lasix regularly.  Today she had an episode of dyspnea, beyond baseline, without pain, without syncope, without fever, without chills, without nausea, without vomiting. She is feeling somewhat better at rest, symptoms were worse with exertion.    Past Medical History:  Diagnosis Date   Allergy    Arthritis    Bleeding of blood vessel    ext. genitalia area   Blood in stool    Chronic kidney disease    Depression    Glaucoma    Heart murmur    History of chicken pox    History of recurrent UTIs    History of stomach ulcers    Hyperlipidemia    Hypertension    Memory loss    Rheumatic fever    Seizures ([place])    Stroke (HCC)    TIA (transient ischemic attack) 10/22/2013   Urine incontinence     Patient Active Problem List   Diagnosis Date Noted   Lumbar radiculopathy 07/15/2021   Rheumatoid arthritis ([place]) 01/03/2021   Depression, major, single episode, mild ([place]) 09/29/2019   Polyarthralgia 04/21/2019   Diabetes mellitus without complication ([place]) [id number]   CVA (cerebral vascular accident) ([place]) 01/25/2019   RLS (restless legs syndrome) 01/25/2019   Glaucoma, left eye 01/25/2019   Glaucoma associated with ocular inflammation, left, 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   Nonrheumatic aortic valve insufficiency    CAD in native artery    TIA  (transient ischemic attack) 10/22/2013   HTN (hypertension) 10/22/2013   Hyperlipidemia 10/22/2013    Past Surgical History:  Procedure Laterality Date   ABDOMINAL HYSTERECTOMY     APPENDECTOMY  1983   CATARACT EXTRACTION Right May 13, 2014   EYE SURGERY  12/13/2018   stent inserted in left eye   gum transplant     RIGHT/LEFT HEART CATH AND CORONARY ANGIOGRAPHY N/A 05/12/2018   Procedure: RIGHT/LEFT HEART CATH AND CORONARY ANGIOGRAPHY;  Surgeon: Belva Crome, MD;  Location: [facility];  Service: Cardiovascular;  Laterality: N/A;   THORACIC AORTOGRAM N/A 05/12/2018   Procedure: THORACIC AORTOGRAM;  Surgeon: Belva Crome, MD;  Location: [facility];  Service: Cardiovascular;  Laterality: N/A;   TONSILLECTOMY       OB History     Gravida  4   Para      Term      Preterm      AB  2   Living  2      SAB      IAB      Ectopic      Multiple      Live Births              Family History  Problem Relation Age of Onset   Cancer Mother    Early death Mother    Heart disease Father  Arthritis Father    Early death Father    Hearing loss Father    Hypertension Father    Hyperlipidemia Father    Cancer Sister    Arthritis Sister    Arthritis Brother    Diabetes Brother    Heart attack Brother    Heart disease Brother    Hyperlipidemia Brother    Hypertension Brother    Diabetes Daughter    Heart disease Daughter    Hyperlipidemia Daughter    Hyperlipidemia Maternal Grandmother    Hypertension Maternal Grandmother    Stroke Maternal Grandmother    Alcohol abuse Maternal Grandfather    Early death Maternal Grandfather        tuberculosis   Arthritis Paternal Grandmother    Alcohol abuse Paternal Grandfather    Heart disease Paternal Grandfather    Hyperlipidemia Paternal Grandfather    Hypertension Paternal Grandfather    Stroke Paternal Grandfather    Alcohol abuse Sister    COPD Sister    Drug abuse Sister    Early death  Sister    Heart disease Sister    Hypertension Sister    Hyperlipidemia Sister    Early death Brother    Early death Brother     Social History   Tobacco Use   Smoking status: Former    Years: 40.00    Types: Cigarettes    Quit date: 11/05/1971    Years since quitting: 49.8   Smokeless tobacco: Never  Vaping Use   Vaping Use: Never used  Substance Use Topics   Alcohol use: No    Alcohol/week: 0.0 standard drinks   Drug use: No    Home Medications Prior to Admission medications   Medication Sig Start Date End Date Taking? Authorizing Provider  acetaminophen (TYLENOL) 650 MG CR tablet Take 650 mg by mouth every 8 (eight) hours as needed for pain.    [provider]  b complex vitamins capsule Take 1 capsule by mouth daily.    [provider]  brimonidine (ALPHAGAN) 0.2 % ophthalmic solution Place 1 drop into both eyes 2 (two) times daily.    [provider]  clopidogrel (PLAVIX) 75 MG tablet TAKE ONE TABLET BY MOUTH DAILY 07/29/21   Allwardt, Randa Evens, PA-C  dapagliflozin propanediol (FARXIGA) 5 MG TABS tablet Take 1 tablet (5 mg total) by mouth daily before breakfast. 08/15/21 09/14/21  Allwardt, Randa Evens, PA-C  estradiol (ESTRACE) 0.1 MG/GM vaginal cream Place 0.5 g vaginally 2 (two) times a week. Place 0.5g twice a week 04/03/21   Jaquita Folds, MD  famciclovir [facility]) 500 MG tablet Take 500 mg by mouth daily.  01/24/19   [provider]  famotidine (PEPCID) 20 MG tablet TAKE ONE TABLET BY MOUTH TWICE A DAY 04/09/21   Leamon Arnt, MD  furosemide (LASIX) 80 MG tablet TAKE TWO TABLETS BY MOUTH DAILY 07/07/21   Leamon Arnt, MD  glucose blood (CONTOUR NEXT TEST) test strip Use as instructed 08/15/21   Allwardt, [name] M, PA-C  inFLIXimab (REMICADE) 100 MG injection Inject 100 mg into the vein every 8 (eight) weeks.    [provider]  KLOR-CON M20 20 MEQ tablet TAKE ONE TABLET BY MOUTH DAILY Patient taking differently: Take 20  mEq by mouth daily. 01/09/21   Orma Flaming, MD  Lancets MISC Check blood sugar once daily. Contour Next Device. 08/15/21   Allwardt, Randa Evens, PA-C  loratadine (CLARITIN) 10 MG tablet Take 10 mg by mouth daily.  [provider]  losartan (COZAAR) 100 MG tablet TAKE ONE TABLET BY MOUTH DAILY Patient taking differently: Take 100 mg by mouth daily. 10/17/20   Orma Flaming, MD  lovastatin (MEVACOR) 20 MG tablet TAKE ONE TABLET BY MOUTH EVERY EVENING Patient taking differently: Take 20 mg by mouth at bedtime. 01/03/21   Orma Flaming, MD  Multiple Vitamins-Minerals (CENTRUM SILVER 50+WOMEN PO) Take 1 tablet by mouth daily.    [provider]  pramipexole (MIRAPEX) 0.25 MG tablet TAKE ONE TABLET BY MOUTH EVERY NIGHT AT BEDTIME 08/06/21   Allwardt, [name] M, PA-C  prednisoLONE acetate (PRED FORTE) 1 % ophthalmic suspension Place 1 drop into the right eye 4 (four) times daily. 01/18/21   [provider]  predniSONE (STERAPRED UNI-PAK 21 TAB) 10 MG (21) TBPK tablet Take as directed 07/15/21   Leandrew Koyanagi, MD  sertraline (ZOLOFT) 25 MG tablet TAKE ONE TABLET BY MOUTH DAILY Patient taking differently: Take 25 mg by mouth at bedtime. 11/22/20   Orma Flaming, MD  Vibegron 75 MG TABS Take 1 tablet by mouth daily. 08/20/21   Jaquita Folds, MD    Allergies    Dorzolamide hcl-timolol mal, Tetanus toxoids, and Hctz [hydrochlorothiazide]  Review of Systems   Review of Systems  Constitutional:        Per HPI, otherwise negative  HENT:         Per HPI, otherwise negative  Respiratory:         Per HPI, otherwise negative  Cardiovascular:        Per HPI, otherwise negative  Gastrointestinal:  Negative for vomiting.  Endocrine:       Negative aside from HPI  Genitourinary:        Neg aside from HPI   Musculoskeletal:        Per HPI, otherwise negative  Skin: Negative.   Neurological:  Negative for syncope.   Physical Exam Updated Vital Signs BP (!) 146/84    Pulse (!) 112   Temp 98.1 F (36.7 C) (Oral)   Resp 18   LMP  (LMP Unknown)   SpO2 90%   Physical Exam Vitals and nursing note reviewed.  Constitutional:      General: She is not in acute distress.    Appearance: She is well-developed.  HENT:     Head: Normocephalic and atraumatic.  Eyes:     Conjunctiva/sclera: Conjunctivae normal.  Cardiovascular:     Rate and Rhythm: Normal rate and regular rhythm.  Pulmonary:     Effort: Pulmonary effort is normal. No respiratory distress.     Breath sounds: Normal breath sounds. No stridor.  Abdominal:     General: There is no distension.  Skin:    General: Skin is warm and dry.  Neurological:     Mental Status: She is alert and oriented to person, place, and time.     Cranial Nerves: No cranial nerve deficit.    ED Results / Procedures / Treatments   Labs (all labs ordered are listed, but only abnormal results are displayed) Labs Reviewed  CBC WITH DIFFERENTIAL/PLATELET - Abnormal; Notable for the following components:      Result Value   Platelets 124 (*)    Abs Immature Granulocytes 0.12 (*)    All other components within normal limits  BASIC METABOLIC PANEL - Abnormal; Notable for the following components:   Glucose, Bld 209 (*)    BUN 24 (*)    Creatinine, Ser 1.06 (*)  GFR, Estimated 52 (*)    All other components within normal limits  TROPONIN I (HIGH SENSITIVITY) - Abnormal; Notable for the following components:   Troponin I (High Sensitivity) 19 (*)    All other components within normal limits  TROPONIN I (HIGH SENSITIVITY) - Abnormal; Notable for the following components:   Troponin I (High Sensitivity) 18 (*)    All other components within normal limits  BRAIN NATRIURETIC PEPTIDE    EKG EKG Interpretation  Date/Time:  Thursday August 28 2021 09:43:37 EDT Ventricular Rate:  80 PR Interval:  176 QRS Duration: 72 QT Interval:  354 QTC Calculation: 408 R Axis:   0 Text Interpretation: Sinus rhythm with  Premature supraventricular complexes Minimal voltage criteria for LVH, may be normal variant ( R in aVL ) Cannot rule out Anterior infarct , age undetermined Artifact Abnormal ECG Confirmed by Carmin Muskrat [phone]) on 08/28/2021 12:09:15 PM  Radiology DG Chest 2 View  Result Date: 08/28/2021 CLINICAL DATA:  Shortness of breath EXAM: CHEST - 2 VIEW COMPARISON:  Chest radiograph 08/20/2021 FINDINGS: The cardiomediastinal silhouette is stable, with unchanged calcified atherosclerotic plaque of the aortic arch. There is no focal consolidation or pulmonary edema. There is no pleural effusion or pneumothorax. There is no acute osseous abnormality. IMPRESSION: Stable chest with no radiographic evidence of acute cardiopulmonary process. Electronically Signed   By: Valetta Mole M.D.   On: 08/28/2021 10:54    Procedures ECHO 03/29/21 IMPRESSIONS     1. Left ventricular ejection fraction, by estimation, is 60 to 65%. The  left ventricle has normal function. The left ventricle has no regional  wall motion abnormalities. Left ventricular diastolic parameters are  consistent with Grade I diastolic  dysfunction (impaired relaxation).   2. Right ventricular systolic function is normal. The right ventricular  size is normal. There is normal pulmonary artery systolic pressure.   3. The mitral valve is normal in structure. No evidence of mitral valve  regurgitation. No evidence of mitral stenosis.   4. The aortic valve is calcified. There is moderate calcification of the  aortic valve. There is moderate thickening of the aortic valve. Aortic  valve regurgitation is mild to moderate. No aortic stenosis is present.   5. The inferior vena cava is normal in size with greater than 50%  respiratory variability, suggesting right atrial pressure of 3 mmHg.   Conclusion(s)/Recommendation(s): No intracardiac source of embolism  detected on this transthoracic study. A transesophageal echocardiogram is  recommended to  exclude cardiac source of embolism if clinically indicated.    Medications Ordered in ED Medications - No data to display  ED Course  I have reviewed the triage vital signs and the nursing notes.  Pertinent labs & imaging results that were available during my care of the patient were reviewed by me and considered in my medical decision making (see chart for details).  Pulse ox 90% room air normal Cardiac 70s normal 4:46 PM On repeat exam patient in no distress, awake, alert, speaking clearly.  Insignificant delta troponin, negative D-dimer, x-ray without pneumonia, pneumothorax, all reassuring, low suspicion for atypical ACS, PE, or other acute new phenomena.  I discussed the patient's presentation with her former primary care physician, and subsequently all results with patient and her daughter. Patient has a scheduled cardiology follow-up next month, may follow-up with pulmonology as needed.  Patient discharged in stable condition. MDM Rules/Calculators/A&P MDM Number of Diagnoses or Management Options Shortness of breath: established, worsening   Amount and/or Complexity of  Data Reviewed Clinical lab tests: ordered and reviewed Tests in the radiology section of CPT: ordered and reviewed Tests in the medicine section of CPT: reviewed and ordered Decide to obtain previous medical records or to obtain history from someone other than the patient: yes Obtain history from someone other than the patient: yes Review and summarize past medical records: yes Discuss the patient with other providers: yes Independent visualization of images, tracings, or specimens: yes  Risk of Complications, Morbidity, and/or Mortality Presenting problems: high Diagnostic procedures: high Management options: high  Critical Care Total time providing critical care: < 30 minutes  Patient Progress Patient progress: stable   Final Clinical Impression(s) / ED Diagnoses Final diagnoses:  Shortness of  breath     Carmin Muskrat, MD 08/28/21 1648

## 2021-08-28 NOTE — ED Provider Notes (Signed)
Emergency Medicine Provider Triage Evaluation Note  RUTHANN ROATH , a 85 y.o. female  was evaluated in triage.  Pt complains of SOB x months, worse this AM. Taking lasix and other meds as prescribed. Chronic mild cough. NO LE edema. NO pain. Worse with movement  Review of Systems  Positive: SOB, DOE Negative: Pain, LE edema, hemoptysis  Physical Exam  BP 121/73 (BP Location: Right Arm)   Pulse 73   Temp 98.1 F (36.7 C) (Oral)   Resp (!) 22   LMP  (LMP Unknown)   SpO2 100%  Gen:   Awake, no distress   Resp:  Normal effort, clear BL MSK:   Moves extremities without difficulty, trace LE edema Other:    Medical Decision Making  Medically screening exam initiated at 10:11 AM.  Appropriate orders placed.  KHANI PRECHT was informed that the remainder of the evaluation will be completed by another provider, this initial triage assessment does not replace that evaluation, and the importance of remaining in the ED until their evaluation is complete.  DOE, SOB   Mitsuo Budnick A, PA-C 08/28/21 1012    Carmin Muskrat, MD 08/28/21 1708

## 2021-08-28 NOTE — ED Triage Notes (Signed)
Pt reports sob for extended amount of time, takes lasix as prescribed. Woke up this am with increase in sob. Spo2 100% at triage, denies chest pain or an increase in leg swelling. No resp distress noted.

## 2021-08-28 NOTE — ED Notes (Signed)
Pt ambulated to BR with steady gait.

## 2021-08-28 NOTE — Discharge Instructions (Addendum)
As discussed, your evaluation today has been largely reassuring.  But, it is important that you monitor your condition carefully, and do not hesitate to return to the ED if you develop new, or concerning changes in your condition. ? ?Otherwise, please follow-up with your physician for appropriate ongoing care. ? ?

## 2021-08-28 NOTE — Telephone Encounter (Signed)
Noted patient sent to ED

## 2021-09-03 ENCOUNTER — Telehealth: Payer: Self-pay

## 2021-09-03 ENCOUNTER — Telehealth: Payer: Self-pay | Admitting: Pulmonary Disease

## 2021-09-03 NOTE — Telephone Encounter (Signed)
Called and spoke with patient. She stated that she has been dealing with increased SOB for the past week. She went to the ED last week and they did not prescribe anything for her.   I advised her that since she has not been seen in our office since 2019, she would need to re-establish. She has an appt to see [place] next week.   Advised her that since it has been over 3 years and we did not have any sooner openings, she would need to contact her PCP or go back to the ED.   She verbalized understanding.   Nothing further needed at time of call.

## 2021-09-03 NOTE — Telephone Encounter (Signed)
Nurse Assessment Nurse: Clovis Riley RN, Georgina Peer Date/Time (Eastern Time): 09/03/2021 10:05:14 AM Confirm and document reason for call. If symptomatic, describe symptoms. ---Caller states she is having shortness of breath that has gotten worse in the last week. She has a pulmonology appt on the 15th. In the ER on thursday. Short of breath at rest. Does the patient have any new or worsening symptoms? ---Yes Will a triage be completed? ---Yes Related visit to physician within the last 2 weeks? ---Yes Does the PT have any chronic conditions? (i.e. diabetes, asthma, this includes High risk factors for pregnancy, etc.) ---Yes List chronic conditions. ---RA, diabetes, HTN, Is this a behavioral health or substance abuse call? ---No Guidelines Guideline Title Affirmed Question Affirmed Notes Nurse Date/Time (Eastern Time) Breathing Difficulty [1] MODERATE difficulty breathing (e.g., speaks in phrases, SOB even at rest, pulse 100-120) AND [2] NEW-onset Clovis Riley, RNGeorgina Peer 09/03/2021 10:08:36 AM PLEASE NOTE: All timestamps contained within this report are represented as [place] Standard Time. CONFIDENTIALTY NOTICE: This fax transmission is intended only for the addressee. It contains information that is legally privileged, confidential or otherwise protected from use or disclosure. If you are not the intended recipient, you are strictly prohibited from reviewing, disclosing, copying using or disseminating any of this information or taking any action in reliance on or regarding this information. If you have received this fax in error, please notify us immediately by telephone so that we can arrange for its return to [place]. Phone: [phone], Toll-Free: [phone], Fax: [phone] Page: 2 of 2 Call Id: [id number] Guidelines Guideline Title Affirmed Question Affirmed Notes Nurse Date/Time Eilene Ghazi Time) or WORSE than normal Disp. Time Eilene Ghazi Time) Disposition Final User 09/03/2021 10:03:59 AM  Send to Urgent Queue Donato Heinz 09/03/2021 10:10:00 AM Go to ED Now Yes Clovis Riley, RN, Leilani Merl Disagree/Comply Disagree Caller Understands Yes PreDisposition Call Doctor Care Advice Given Per Guideline GO TO ED NOW: * You need to be seen in the Emergency Department. * Go to the ED at ___________ [place] now. Drive carefully. NOTE TO TRIAGER - DRIVING: * Another adult should drive. * Patient should not delay going to the emergency department. CALL EMS 911 IF: * Call EMS if you become worse. CARE ADVICE given per Breathing Difficulty (Adult) guideline. Comments User: Arman Bogus, RN Date/Time Eilene Ghazi Time): 09/03/2021 10:12:54 AM office notified and will call pt back Referrals [place] REFUSED

## 2021-09-03 NOTE — Telephone Encounter (Signed)
Spoke with [name] stated due to symptoms patient needs to go to ED for further testing. Attempted to call patient no answer,left message for patient to call clinic,left message on daughters phone as well.

## 2021-09-05 NOTE — Telephone Encounter (Signed)
Spoke with Patient declined ED, she has a appt with Pulmonary clinic. Informed to go to ED if breathing worsens to the point that she can not catch her breathe she voices understanding. FYI

## 2021-09-05 NOTE — Telephone Encounter (Signed)
Noted and agreed, thank you. 

## 2021-09-10 ENCOUNTER — Ambulatory Visit (INDEPENDENT_AMBULATORY_CARE_PROVIDER_SITE_OTHER): Payer: Medicare HMO | Admitting: Physician Assistant

## 2021-09-10 ENCOUNTER — Other Ambulatory Visit: Payer: Self-pay

## 2021-09-10 ENCOUNTER — Encounter: Payer: Self-pay | Admitting: Physician Assistant

## 2021-09-10 ENCOUNTER — Encounter: Payer: Medicare HMO | Admitting: Physician Assistant

## 2021-09-10 VITALS — BP 111/71 | HR 79 | Temp 97.0°F | Ht 61.0 in | Wt 172.0 lb

## 2021-09-10 DIAGNOSIS — I10 Essential (primary) hypertension: Secondary | ICD-10-CM

## 2021-09-10 DIAGNOSIS — E1165 Type 2 diabetes mellitus with hyperglycemia: Secondary | ICD-10-CM | POA: Diagnosis not present

## 2021-09-10 DIAGNOSIS — R0609 Other forms of dyspnea: Secondary | ICD-10-CM

## 2021-09-10 DIAGNOSIS — R0600 Dyspnea, unspecified: Secondary | ICD-10-CM

## 2021-09-10 MED ORDER — ALBUTEROL SULFATE HFA 108 (90 BASE) MCG/ACT IN AERS: 2.0000 | INHALATION_SPRAY | Freq: Four times a day (QID) | RESPIRATORY_TRACT | 0 refills | Status: DC | PRN

## 2021-09-10 NOTE — Progress Notes (Signed)
Established Patient Office Visit  Subjective:  Patient ID: Cheryl Wood, female    DOB: 08/27/1936  Age: 85 y.o. MRN: [id number]  CC:  Chief Complaint  Patient presents with   Cheryl Wood presents for [facility] visit / f/up.   Her main concern is still her ongoing shortness of breath.  She had an emergency visit on 08/28/2021 at [facility].  Her labs looked okay, including negative D-dimer and negative BNP.  Her chest x-ray was also normal. Appt with pulmonology tomorrow.Appt with cardiology Dr. Gwenlyn Found on 10/22/21.  No new changes in her breathing. No edema. No chest pain.  No fever or chills.  No unintentional weight loss.  She has also started back on Farxiga 5 mg daily.  She has not been checking her sugars, but says that she is feeling okay.  Her blood pressure has been running normal.  She has been taking her medications as directed.  Past Medical History:  Diagnosis Date   Allergy    Arthritis    Bleeding of blood vessel    ext. genitalia area   Blood in stool    Chronic kidney disease    Depression    Glaucoma    Heart murmur    History of chicken pox    History of recurrent UTIs    History of stomach ulcers    Hyperlipidemia    Hypertension    Memory loss    Rheumatic fever    Seizures (HCC)    Stroke (HCC)    TIA (transient ischemic attack) 10/22/2013   Urine incontinence     Past Surgical History:  Procedure Laterality Date   ABDOMINAL HYSTERECTOMY     APPENDECTOMY  1983   CATARACT EXTRACTION Right May 13, 2014   EYE SURGERY  12/13/2018   stent inserted in left eye   gum transplant     RIGHT/LEFT HEART CATH AND CORONARY ANGIOGRAPHY N/A 05/12/2018   Procedure: RIGHT/LEFT HEART CATH AND CORONARY ANGIOGRAPHY;  Surgeon: Belva Crome, MD;  Location: [place] [facility];  Service: Cardiovascular;  Laterality: N/A;   THORACIC AORTOGRAM N/A 05/12/2018   Procedure: THORACIC AORTOGRAM;  Surgeon: Belva Crome, MD;  Location: [place] [facility];   Service: Cardiovascular;  Laterality: N/A;   TONSILLECTOMY      Family History  Problem Relation Age of Onset   Cancer Mother    Early death Mother    Heart disease Father    Arthritis Father    Early death Father    Hearing loss Father    Hypertension Father    Hyperlipidemia Father    Cancer Sister    Arthritis Sister    Arthritis Brother    Diabetes Brother    Heart attack Brother    Heart disease Brother    Hyperlipidemia Brother    Hypertension Brother    Diabetes Daughter    Heart disease Daughter    Hyperlipidemia Daughter    Hyperlipidemia Maternal Grandmother    Hypertension Maternal Grandmother    Stroke Maternal Grandmother    Alcohol abuse Maternal Grandfather    Early death Maternal Grandfather        tuberculosis   Arthritis Paternal Grandmother    Alcohol abuse Paternal Grandfather    Heart disease Paternal Grandfather    Hyperlipidemia Paternal Grandfather    Hypertension Paternal Grandfather    Stroke Paternal Grandfather    Alcohol abuse Sister    COPD Sister  Drug abuse Sister    Early death Sister    Heart disease Sister    Hypertension Sister    Hyperlipidemia Sister    Early death Brother    Early death Brother     Social History   Socioeconomic History   Marital status: Divorced    Spouse name: Not on file   Number of children: 2   Years of education: college   Highest education level: Not on file  Occupational History    Employer: RETIRED  Tobacco Use   Smoking status: Former    Years: 40.00    Types: Cigarettes    Quit date: 11/05/1971    Years since quitting: 49.8   Smokeless tobacco: Never  Vaping Use   Vaping Use: Never used  Substance and Sexual Activity   Alcohol use: No    Alcohol/week: 0.0 standard drinks   Drug use: No   Sexual activity: Not Currently  Other Topics Concern   Not on file  Social History Narrative   Patient lives at home alone   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   Worked at [employer] for 18 years   Daughters in [place] and in [place]   Caffeine Use: 2-3 cups daily   Social Determinants of Health   Financial Resource Strain: Low Risk    Difficulty of Paying Living Expenses: Not hard at all  Food Insecurity: No Food Insecurity   Worried About Charity fundraiser in the Last Year: Never true   Arboriculturist in the Last Year: Never true  Transportation Needs: No Transportation Needs   Lack of Transportation (Medical): No   Lack of Transportation (Non-Medical): No  Physical Activity: Inactive   Days of Exercise per Week: 0 days   Minutes of Exercise per Session: 0 min  Stress: No Stress Concern Present   Feeling of Stress : Not at all  Social Connections: Moderately Isolated   Frequency of Communication with Friends and Family: More than three times a week   Frequency of Social Gatherings with Friends and Family: More than three times a week   Attends Religious Services: More than 4 times per year   Active Member of [employer] or Organizations: No   Attends Archivist Meetings: Never   Marital Status: Divorced  Human resources officer Violence: Not At Risk   Fear of Current or Ex-Partner: No   Emotionally Abused: No   Physically Abused: No   Sexually Abused: No    Outpatient Medications Prior to Visit  Medication Sig Dispense Refill   acetaminophen (TYLENOL) 650 MG CR tablet Take 650 mg by mouth every 8 (eight) hours as needed for pain.     b complex vitamins capsule Take 1 capsule by mouth daily.     brimonidine (ALPHAGAN) 0.2 % ophthalmic solution Place 1 drop into both eyes 2 (two) times daily.     clopidogrel (PLAVIX) 75 MG tablet TAKE ONE TABLET BY MOUTH DAILY (Patient taking differently: Take 75 mg by mouth daily.) 90 tablet 3   dapagliflozin propanediol (FARXIGA) 5 MG TABS tablet Take 1 tablet (5 mg total) by mouth daily before breakfast. 30 tablet 0   estradiol (ESTRACE) 0.1 MG/GM vaginal cream Place 0.5 g  vaginally 2 (two) times a week. Place 0.5g twice a week (Patient not taking: No sig reported) 30 g 11   famciclovir (FAMVIR) 500 MG tablet Take 500 mg by mouth daily.  famotidine (PEPCID) 20 MG tablet TAKE ONE TABLET BY MOUTH TWICE A DAY (Patient taking differently: Take 20 mg by mouth 2 (two) times daily.) 180 tablet 2   furosemide (LASIX) 80 MG tablet TAKE TWO TABLETS BY MOUTH DAILY (Patient taking differently: Take 40 mg by mouth 2 (two) times daily.) 180 tablet 2   glucose blood (CONTOUR NEXT TEST) test strip Use as instructed 100 each 12   inFLIXimab (REMICADE) 100 MG injection Inject 100 mg into the vein every 8 (eight) weeks.     KLOR-CON M20 20 MEQ tablet TAKE ONE TABLET BY MOUTH DAILY (Patient taking differently: Take 20 mEq by mouth daily.) 90 tablet 2   Lancets MISC Check blood sugar once daily. Contour Next Device. 100 each 0   loratadine (CLARITIN) 10 MG tablet Take 10 mg by mouth daily.     losartan (COZAAR) 100 MG tablet TAKE ONE TABLET BY MOUTH DAILY (Patient taking differently: Take 100 mg by mouth daily.) 90 tablet 3   lovastatin (MEVACOR) 20 MG tablet TAKE ONE TABLET BY MOUTH EVERY EVENING (Patient taking differently: Take 20 mg by mouth at bedtime.) 90 tablet 3   Multiple Vitamins-Minerals (CENTRUM SILVER 50+WOMEN PO) Take 1 tablet by mouth daily.     pramipexole (MIRAPEX) 0.25 MG tablet TAKE ONE TABLET BY MOUTH EVERY NIGHT AT BEDTIME (Patient taking differently: Take 0.25 mg by mouth daily.) 90 tablet 1   prednisoLONE acetate (PRED FORTE) 1 % ophthalmic suspension Place 1 drop into both eyes in the morning and at bedtime.     predniSONE (STERAPRED UNI-PAK 21 TAB) 10 MG (21) TBPK tablet Take as directed (Patient not taking: No sig reported) 21 tablet 0   sertraline (ZOLOFT) 25 MG tablet TAKE ONE TABLET BY MOUTH DAILY (Patient taking differently: Take 25 mg by mouth at bedtime.) 90 tablet 3   timolol (BETIMOL) 0.5 % ophthalmic solution Place 1 drop into the right eye 2 (two)  times daily.     Vibegron 75 MG TABS Take 1 tablet by mouth daily. 30 tablet 11   No facility-administered medications prior to visit.    Allergies  Allergen Reactions   Dorzolamide Hcl-Timolol Mal Other (See Comments)    Red eyes   Tetanus Toxoids Swelling    Arm was twice the size it should be   Hctz [Hydrochlorothiazide] Rash    ROS Review of Systems REFER TO HPI FOR PERTINENT POSITIVES AND NEGATIVES    Objective:    Physical Exam Vitals and nursing note reviewed.  Constitutional:      General: She is not in acute distress.    Appearance: Normal appearance. She is normal weight.  HENT:     Head: Normocephalic.     Right Ear: External ear normal.     Left Ear: External ear normal.     Nose: Nose normal.     Mouth/Throat:     Mouth: Mucous membranes are moist.  Eyes:     Extraocular Movements: Extraocular movements intact.     Conjunctiva/sclera: Conjunctivae normal.     Pupils: Pupils are equal, round, and reactive to light.  Cardiovascular:     Rate and Rhythm: Normal rate and regular rhythm.     Pulses: Normal pulses.     Heart sounds: No murmur heard.    Comments: Brawny discoloration lower extremities Pulmonary:     Effort: Pulmonary effort is normal.     Breath sounds: Normal breath sounds.  Abdominal:     General: Abdomen is flat. Bowel  sounds are normal.     Palpations: Abdomen is soft.     Tenderness: There is no abdominal tenderness.  Musculoskeletal:        General: Normal range of motion.     Cervical back: Normal range of motion.     Right lower leg: No edema.     Left lower leg: No edema.  Skin:    General: Skin is warm.  Neurological:     General: No focal deficit present.     Mental Status: She is alert and oriented to person, place, and time.     Gait: Gait normal.  Psychiatric:        Mood and Affect: Mood normal.        Behavior: Behavior normal.    BP 111/71   Pulse 79   Temp (!) 97 F (36.1 C)   Ht '5\' 1"'$  (1.549 m)   Wt 172  lb (78 kg)   LMP  (LMP Unknown)   SpO2 91%   BMI 32.50 kg/m  Wt Readings from Last 3 Encounters:  09/10/21 172 lb (78 kg)  08/20/21 169 lb 9.6 oz (76.9 kg)  08/20/21 168 lb (76.2 kg)     Health Maintenance Due  Topic Date Due   Zoster Vaccines- Shingrix (1 of 2) Never done   FOOT EXAM  10/31/2020   MAMMOGRAM  03/25/2021    There are no preventive care reminders to display for this patient.  Lab Results  Component Value Date   TSH 1.82 11/29/2019   Lab Results  Component Value Date   WBC 8.8 08/28/2021   HGB 13.3 08/28/2021   HCT 39.7 08/28/2021   MCV 99.0 08/28/2021   PLT 124 (L) 08/28/2021   Lab Results  Component Value Date   NA 141 08/28/2021   K 3.6 08/28/2021   CO2 26 08/28/2021   GLUCOSE 209 (H) 08/28/2021   BUN 24 (H) 08/28/2021   CREATININE 1.06 (H) 08/28/2021   BILITOT 1.5 (H) 03/28/2021   ALKPHOS 57 03/28/2021   AST 22 03/28/2021   ALT 20 03/28/2021   PROT 7.1 03/28/2021   ALBUMIN 4.3 03/28/2021   CALCIUM 9.1 08/28/2021   ANIONGAP 9 08/28/2021   GFR 48.23 (L) 04/03/2021   Lab Results  Component Value Date   CHOL 205 (H) 07/03/2020   Lab Results  Component Value Date   HDL 49.50 07/03/2020   Lab Results  Component Value Date   LDLCALC 132 (H) 07/03/2020   Lab Results  Component Value Date   TRIG 118.0 07/03/2020   Lab Results  Component Value Date   CHOLHDL 4 07/03/2020   Lab Results  Component Value Date   HGBA1C 8.1 (A) 08/15/2021      Assessment & Plan:   Problem List Items Addressed This Visit   None   1. Essential hypertension Her blood pressure is to goal. She is taking losartan 100 mg daily.  2. Type 2 diabetes mellitus with hyperglycemia, without long-term current use of insulin ([place]) She is doing well with Farxiga 5 mg daily. We will recheck labs including hemoglobin A1c in 3 months.  She will continue to work on [employer].  3. Dyspnea on exertion Ongoing issue.  No new changes.  She has appointment with  pulmonology tomorrow.  I did prescribe an albuterol inhaler for her to try today to see if this makes any difference for her.   Follow-up: No follow-ups on file.    Linc Renne M Sayaka Hoeppner, PA-C

## 2021-09-10 NOTE — Patient Instructions (Signed)
Good to see you again.  I'm glad you have f/up with pulmonology tomorrow so hopefully we can get a better picture of what is happening with your lungs. At the very least, try the rescue albuterol inhaler today to see if this brings any relief. Your blood pressure looks great. Continue on your regular medications.

## 2021-09-11 ENCOUNTER — Ambulatory Visit (INDEPENDENT_AMBULATORY_CARE_PROVIDER_SITE_OTHER): Payer: Medicare HMO | Admitting: Pulmonary Disease

## 2021-09-11 ENCOUNTER — Encounter: Payer: Self-pay | Admitting: Pulmonary Disease

## 2021-09-11 VITALS — BP 118/60 | HR 92 | Temp 99.1°F | Ht 61.81 in | Wt 171.2 lb

## 2021-09-11 DIAGNOSIS — R0600 Dyspnea, unspecified: Secondary | ICD-10-CM | POA: Diagnosis not present

## 2021-09-11 DIAGNOSIS — R0609 Other forms of dyspnea: Secondary | ICD-10-CM

## 2021-09-11 NOTE — Patient Instructions (Signed)
Nice to meet you  I ordered PFTs or pulmonary function test to evaluate how well the lungs are working.  The chest x-ray recently looked okay.  Based on results we can discuss next steps.  Sometimes you can have shortness of breath with normal PFTs but usually this is with asthma.  Given the albuterol has not helped to date and the prednisone for your eyes not help your breathing I am not convinced asthma is the cause of your symptoms.  Use albuterol in the morning in the afternoons, spaced 6 hours apart, through the weekend.  Please report back on Monday if this has been helpful or not as this will be informative in our next steps.  Return to clinic in 4 weeks or sooner as needed

## 2021-09-11 NOTE — Progress Notes (Signed)
$'@Patient'Y$  ID: Cheryl Wood, female    DOB: [birth year], 85 y.o.   MRN: [id number]  Chief Complaint  Patient presents with   Consult    Patient is currently wheezing, didn't use inhaler to show something is wrong     Referring provider: Allwardt, Randa Evens, PA-C  HPI:   85 y.o. woman whom we are seeing in consultation for dyspnea on exertion.  ED note 08/28/2021 reviewed.  PCP note yesterday reviewed.  Most recent pulmonary note from Dr. Elsworth Soho 2019 reviewed.  She describes walking history of dyspnea exertion.  Present for years.  She thinks worse over the last 2 to 3 months.  Mildly worse with exertion although certainly present at rest.  No clear precipitating event or factors.  She denies any time of day when things are better or worse.  No positional changes with things are better or worse.  No environmental seasonal factors to account for worsening symptoms.  Was prescribed albuterol yesterday by her PCP.  Use it twice yesterday.  Did not find it very helpful.  Notably she was on a prednisone course for her eyes in July.  While taking her oral prednisone it did not help her breathing.  She denies any atopic or seasonal allergy symptoms.  She had chest x-ray 08/28/2021 though my review interpretation reveals clear lungs bilaterally with evidence of elevated left hemidiaphragm.  Review of serial chest x-ray dated back to 2019 reveals similar mild elevation left hemidiaphragm.  She had spirometry 2019 as discussed below that were normal.  Reviewed left heart catheterization 2019 that showed elevated LVEDP low 20s.  PMH: Diastolic dysfunction, aortic valve regurgitation, diabetes, hypertension Surgical history: Hysterectomy, appendectomy, eye surgery Family history: Mother with cancer, father with CAD, hypertension, hyperlipidemia Social history: Lives in [place], former smoker, quit 1970   Questionaires / Pulmonary Flowsheets:   ACT:  No flowsheet data found.  MMRC: No flowsheet data  found.  Epworth:  No flowsheet data found.  Tests:   FENO:  No results found for: NITRICOXIDE  PFT: Spirometry 2019 personally reviewed and interpreted as normal  WALK:  SIX MIN WALK 03/30/2018  Supplimental Oxygen during Test? (L/min) No  Tech Comments: Patient was only able to complete 1.5 laps due to increased SOB and her legs feeling shakey. No O2 was needed during walk.     Imaging: Personally viewed and as per EMR discussion this note DG Chest 2 View  Result Date: 08/28/2021 CLINICAL DATA:  Shortness of breath EXAM: CHEST - 2 VIEW COMPARISON:  Chest radiograph 08/20/2021 FINDINGS: The cardiomediastinal silhouette is stable, with unchanged calcified atherosclerotic plaque of the aortic arch. There is no focal consolidation or pulmonary edema. There is no pleural effusion or pneumothorax. There is no acute osseous abnormality. IMPRESSION: Stable chest with no radiographic evidence of acute cardiopulmonary process. Electronically Signed   By: Valetta Mole M.D.   On: 08/28/2021 10:54   DG Chest 2 View  Result Date: 08/21/2021 CLINICAL DATA:  Shortness of breath EXAM: CHEST - 2 VIEW COMPARISON:  Chest x-ray dated March 28, 2021 FINDINGS: Normal heart size. Mild tortuosity thoracic aorta. Calcifications of the aortic arch eventration of the hemidiaphragms. Lungs are clear.  No pleural effusion or pneumothorax. IMPRESSION: No active cardiopulmonary disease. Electronically Signed   By: Yetta Glassman M.D.   On: 08/21/2021 10:35    Lab Results:  CBC    Component Value Date/Time   WBC 8.8 08/28/2021 1011   RBC 4.01 08/28/2021 1011   HGB  13.3 08/28/2021 1011   HCT 39.7 08/28/2021 1011   PLT 124 (L) 08/28/2021 1011   MCV 99.0 08/28/2021 1011   MCH 33.2 08/28/2021 1011   MCHC 33.5 08/28/2021 1011   RDW 14.0 08/28/2021 1011   LYMPHSABS 2.4 08/28/2021 1011   MONOABS 0.8 08/28/2021 1011   EOSABS 0.1 08/28/2021 1011   BASOSABS 0.0 08/28/2021 1011    BMET    Component Value  Date/Time   NA 141 08/28/2021 1011   NA 144 11/29/2019 0000   K 3.6 08/28/2021 1011   CL 106 08/28/2021 1011   CO2 26 08/28/2021 1011   GLUCOSE 209 (H) 08/28/2021 1011   BUN 24 (H) 08/28/2021 1011   BUN 21 11/29/2019 0000   CREATININE 1.06 (H) 08/28/2021 1011   CREATININE 1.07 (H) 10/03/2020 0854   CALCIUM 9.1 08/28/2021 1011   GFRNONAA 52 (L) 08/28/2021 1011   GFRNONAA 48 (L) 10/03/2020 0854   GFRAA 56 (L) 10/03/2020 0854    BNP    Component Value Date/Time   BNP 97.8 08/28/2021 1011    ProBNP    Component Value Date/Time   PROBNP 73.0 10/13/2019 0838    Specialty Problems   None  Allergies  Allergen Reactions   Dorzolamide Hcl-Timolol Mal Other (See Comments)    Red eyes   Tetanus Toxoids Swelling    Arm was twice the size it should be   Hctz [Hydrochlorothiazide] Rash    Immunization History  Administered Date(s) Administered   Fluad Quad(high Dose 65+) 09/29/2019, 10/03/2020   Influenza Split 08/30/2017   Influenza-Unspecified 09/28/2018   Moderna SARS-COV2 Booster Vaccination 01/31/2021   Moderna Sars-Covid-2 Vaccination 02/09/2020, 03/08/2020, 08/27/2020   Pneumococcal Conjugate-13 03/29/2015   Pneumococcal Polysaccharide-23 11/01/2019    Past Medical History:  Diagnosis Date   Allergy    Arthritis    Bleeding of blood vessel    ext. genitalia area   Blood in stool    Chronic kidney disease    Depression    Glaucoma    Heart murmur    History of chicken pox    History of recurrent UTIs    History of stomach ulcers    Hyperlipidemia    Hypertension    Memory loss    Rheumatic fever    Seizures (HCC)    Stroke (HCC)    TIA (transient ischemic attack) 10/22/2013   Urine incontinence     Tobacco History: Social History   Tobacco Use  Smoking Status Former   Years: 40.00   Types: Cigarettes   Quit date: 11/05/1971   Years since quitting: 49.8  Smokeless Tobacco Never   Counseling given: Not Answered   Continue to not  smoke  Outpatient Encounter Medications as of 09/11/2021  Medication Sig   acetaminophen (TYLENOL) 650 MG CR tablet Take 650 mg by mouth every 8 (eight) hours as needed for pain.   albuterol (VENTOLIN HFA) 108 (90 Base) MCG/ACT inhaler Inhale 2 puffs into the lungs every 6 (six) hours as needed for wheezing or shortness of breath.   b complex vitamins capsule Take 1 capsule by mouth daily.   brimonidine (ALPHAGAN) 0.2 % ophthalmic solution Place 1 drop into both eyes 2 (two) times daily.   clopidogrel (PLAVIX) 75 MG tablet TAKE ONE TABLET BY MOUTH DAILY (Patient taking differently: Take 75 mg by mouth daily.)   dapagliflozin propanediol (FARXIGA) 5 MG TABS tablet Take 1 tablet (5 mg total) by mouth daily before breakfast.   estradiol (ESTRACE) 0.1 MG/GM vaginal  cream Place 0.5 g vaginally 2 (two) times a week. Place 0.5g twice a week   famciclovir (FAMVIR) 500 MG tablet Take 500 mg by mouth daily.    famotidine (PEPCID) 20 MG tablet TAKE ONE TABLET BY MOUTH TWICE A DAY (Patient taking differently: Take 20 mg by mouth 2 (two) times daily.)   furosemide (LASIX) 80 MG tablet TAKE TWO TABLETS BY MOUTH DAILY (Patient taking differently: Take 40 mg by mouth 2 (two) times daily.)   glucose blood (CONTOUR NEXT TEST) test strip Use as instructed   inFLIXimab (REMICADE) 100 MG injection Inject 100 mg into the vein every 8 (eight) weeks.   KLOR-CON M20 20 MEQ tablet TAKE ONE TABLET BY MOUTH DAILY (Patient taking differently: Take 20 mEq by mouth daily.)   Lancets MISC Check blood sugar once daily. Contour Next Device.   loratadine (CLARITIN) 10 MG tablet Take 10 mg by mouth daily.   losartan (COZAAR) 100 MG tablet TAKE ONE TABLET BY MOUTH DAILY (Patient taking differently: Take 100 mg by mouth daily.)   lovastatin (MEVACOR) 20 MG tablet TAKE ONE TABLET BY MOUTH EVERY EVENING (Patient taking differently: Take 20 mg by mouth at bedtime.)   Multiple Vitamins-Minerals (CENTRUM SILVER 50+WOMEN PO) Take 1 tablet  by mouth daily.   pramipexole (MIRAPEX) 0.25 MG tablet TAKE ONE TABLET BY MOUTH EVERY NIGHT AT BEDTIME (Patient taking differently: Take 0.25 mg by mouth daily.)   prednisoLONE acetate (PRED FORTE) 1 % ophthalmic suspension Place 1 drop into both eyes in the morning and at bedtime.   predniSONE (STERAPRED UNI-PAK 21 TAB) 10 MG (21) TBPK tablet Take as directed   sertraline (ZOLOFT) 25 MG tablet TAKE ONE TABLET BY MOUTH DAILY (Patient taking differently: Take 25 mg by mouth at bedtime.)   timolol (BETIMOL) 0.5 % ophthalmic solution Place 1 drop into the right eye 2 (two) times daily.   Vibegron 75 MG TABS Take 1 tablet by mouth daily.   amoxicillin (AMOXIL) 500 MG tablet Take 500 mg by mouth 2 (two) times daily. Take 4 tablets at once before dental appointments (Patient not taking: Reported on 09/11/2021)   No facility-administered encounter medications on file as of 09/11/2021.     Review of Systems  Review of Systems  No chest pain with exertion.  No orthopnea or PND.  No worsening lower extremity swelling.  Comprehensive review of systems otherwise negative. Physical Exam  BP 118/60 (BP Location: Left Arm, Patient Position: Sitting, Cuff Size: Normal)   Pulse 92   Temp 99.1 F (37.3 C) (Oral)   Ht 5' 1.81" (1.57 m)   Wt 171 lb 3.2 oz (77.7 kg)   LMP  (LMP Unknown)   SpO2 99%   BMI 31.50 kg/m   Wt Readings from Last 5 Encounters:  09/11/21 171 lb 3.2 oz (77.7 kg)  09/10/21 172 lb (78 kg)  08/20/21 169 lb 9.6 oz (76.9 kg)  08/20/21 168 lb (76.2 kg)  08/15/21 170 lb (77.1 kg)    BMI Readings from Last 5 Encounters:  09/11/21 31.50 kg/m  09/10/21 32.50 kg/m  08/20/21 32.05 kg/m  08/20/21 31.74 kg/m  08/15/21 32.12 kg/m     Physical Exam General: Sitting in chair, no acute distress Eyes: EOMI, icterus Neck: Supple, no JVP Pulmonary: Distant, clear, normal work of breathing, no wheeze Cardiovascular: Regular rate and rhythm, harsh 3 out of 6 murmur best heard left  upper sternal border Abdomen: Nondistended, bowel sounds present MSK: No synovitis, no joint effusion Neuro: Normal gait,  no weakness Psych: Normal mood, full affect  Assessment & Plan:   DOE: Longstanding.  Suspect largely related to cardiac disease given her aortic valve dysfunction as well as elevated LVEDP in the past.  Prior PFTs normal.  Can repeat.  These are ordered today.  Left hemidiaphragm looks mildly elevated, possible contributor to symptoms.  Consider sniff test in the future if PFTs normal.  No atopic symptoms, no response to albuterol or prednisone in the past.  Low suspicion for asthma.  Trial of albuterol 2 times a day to see if helps with symptoms.  She is to report back.  Consider additional inhalers if albuterol becomes beneficial.  Diastolic function, Aortic valve regurgitation: Likely drivers of symptoms.  Cardiology follow-up upcoming.   Return in about 4 weeks (around 10/09/2021).   Lanier Clam, MD 09/11/2021

## 2021-09-14 ENCOUNTER — Other Ambulatory Visit: Payer: Self-pay | Admitting: Physician Assistant

## 2021-09-15 ENCOUNTER — Other Ambulatory Visit: Payer: Self-pay

## 2021-09-15 ENCOUNTER — Telehealth: Payer: Self-pay

## 2021-09-15 ENCOUNTER — Ambulatory Visit
Admission: RE | Admit: 2021-09-15 | Discharge: 2021-09-15 | Disposition: A | Discharge status: Home / Self Care | Payer: Medicare HMO | Source: Ambulatory Visit | Attending: Family Medicine | Admitting: Family Medicine

## 2021-09-15 ENCOUNTER — Telehealth: Payer: Self-pay | Admitting: Pulmonary Disease

## 2021-09-15 DIAGNOSIS — Z1231 Encounter for screening mammogram for malignant neoplasm of breast: Secondary | ICD-10-CM | POA: Diagnosis not present

## 2021-09-15 NOTE — Telephone Encounter (Signed)
Spoke with the pt and notified of response per Dr Silas Flood. She verbalized understanding. I urged her to keep appt for PFT.

## 2021-09-15 NOTE — Telephone Encounter (Signed)
  Encourage patient to contact the pharmacy for refills or they can request refills through [facility]:  09/10/21  NEXT APPOINTMENT DATE:  MEDICATION:FARXIGA 5 MG TABS tablet  Is the patient out of medication? Yes   PHARMACY: [facility] [id number] - [place], [place]  Let patient know to contact pharmacy at the end of the day to make sure medication is ready.  Please notify patient to allow 48-72 hours to process

## 2021-09-15 NOTE — Telephone Encounter (Signed)
Stop albuterol, see if things get a little better.

## 2021-09-15 NOTE — Telephone Encounter (Signed)
Spoke with the pt  She is calling with update on albuterol hfa as requested by Dr Silas Flood  She states that she used the albuterol 2 puffs ever 6 hours over the weekend and SOB has not improved "may be a little worse" She states that she has noticed more SOB when she is talking  This was going on before, but slightly worse  No other new co's  Please advise, thank you  Allergies  Allergen Reactions   Dorzolamide Hcl-Timolol Mal Other (See Comments)    Red eyes   Tetanus Toxoids Swelling    Arm was twice the size it should be   Hctz [Hydrochlorothiazide] Rash

## 2021-09-15 NOTE — Telephone Encounter (Signed)
Rx sent in

## 2021-09-23 ENCOUNTER — Encounter: Payer: Self-pay | Admitting: Orthopaedic Surgery

## 2021-09-23 ENCOUNTER — Ambulatory Visit (INDEPENDENT_AMBULATORY_CARE_PROVIDER_SITE_OTHER): Payer: Medicare HMO | Admitting: Orthopaedic Surgery

## 2021-09-23 ENCOUNTER — Ambulatory Visit (INDEPENDENT_AMBULATORY_CARE_PROVIDER_SITE_OTHER): Payer: Medicare HMO | Admitting: Pulmonary Disease

## 2021-09-23 ENCOUNTER — Other Ambulatory Visit: Payer: Self-pay

## 2021-09-23 DIAGNOSIS — M5416 Radiculopathy, lumbar region: Secondary | ICD-10-CM

## 2021-09-23 DIAGNOSIS — R0609 Other forms of dyspnea: Secondary | ICD-10-CM

## 2021-09-23 DIAGNOSIS — R0600 Dyspnea, unspecified: Secondary | ICD-10-CM

## 2021-09-23 LAB — PULMONARY FUNCTION TEST
DL/VA % pred: 94 %
DL/VA: 3.92 ml/min/mmHg/L
DLCO cor % pred: 85 %
DLCO cor: 14.37 ml/min/mmHg
DLCO unc % pred: 85 %
DLCO unc: 14.32 ml/min/mmHg
FEF 25-75 Post: 2.21 L/sec
FEF 25-75 Pre: 1.91 L/sec
FEF2575-%Change-Post: 15 %
FEF2575-%Pred-Post: 203 %
FEF2575-%Pred-Pre: 175 %
FEV1-%Change-Post: 2 %
FEV1-%Pred-Post: 103 %
FEV1-%Pred-Pre: 100 %
FEV1-Post: 1.61 L
FEV1-Pre: 1.57 L
FEV1FVC-%Change-Post: 0 %
FEV1FVC-%Pred-Pre: 116 %
FEV6-%Change-Post: 2 %
FEV6-%Pred-Post: 94 %
FEV6-%Pred-Pre: 92 %
FEV6-Post: 1.89 L
FEV6-Pre: 1.83 L
FEV6FVC-%Pred-Post: 106 %
FEV6FVC-%Pred-Pre: 106 %
FVC-%Change-Post: 2 %
FVC-%Pred-Post: 88 %
FVC-%Pred-Pre: 86 %
FVC-Post: 1.89 L
FVC-Pre: 1.83 L
Post FEV1/FVC ratio: 85 %
Post FEV6/FVC ratio: 100 %
Pre FEV1/FVC ratio: 86 %
Pre FEV6/FVC Ratio: 100 %
RV % pred: 73 %
RV: 1.7 L
TLC % pred: 83 %
TLC: 3.84 L

## 2021-09-23 NOTE — Progress Notes (Signed)
Full PFT performed today. °

## 2021-09-23 NOTE — Patient Instructions (Signed)
Full PFT performed today. °

## 2021-09-23 NOTE — Progress Notes (Signed)
Office Visit Note   Patient: Cheryl Wood           Date of Birth: 05-Dec-1936           MRN: [id number] Visit Date: 09/23/2021              Requested by: Allwardt, Randa Evens, PA-C [place],  [place] [id number] PCP: Fredirick Lathe, PA-C   Assessment & Plan: Visit Diagnoses:  1. Lumbar radiculopathy     Plan: At this point we will send a referral to Dr. Ernestina Patches for lumbar spine ESI.  She has had relief from these injections in 2008.  I was unable to locate a recent MRI of the lumbar spine which she states she had in July.  Follow-Up Instructions: No follow-ups on file.   Orders:  No orders of the defined types were placed in this encounter.  No orders of the defined types were placed in this encounter.     Procedures: No procedures performed   Clinical Data: No additional findings.   Subjective: Chief Complaint  Patient presents with   Lower Back - Pain    HPI  Ms. Qin returns today for ongoing low back pain.  She tried physical therapy which has not helped.  She has had prior ESI injections back around 2008 which she remembers as being effective.  She is interested in trying these injections again.  Review of Systems   Objective: Vital Signs: LMP  (LMP Unknown)   Physical Exam  Ortho Exam  Lumbar spine exam is unchanged.  Specialty Comments:  No specialty comments available.  Imaging: No results found.   PMFS History: Patient Active Problem List   Diagnosis Date Noted   Lumbar radiculopathy 07/15/2021   Rheumatoid arthritis ([facility]) 01/03/2021   Depression, major, single episode, mild ([facility]) 09/29/2019   Polyarthralgia 04/21/2019   Diabetes mellitus without complication ([facility]) 53/61/4431   CVA (cerebral vascular accident) ([facility]) 01/25/2019   RLS (restless legs syndrome) 01/25/2019   Glaucoma, left eye 01/25/2019   Glaucoma associated with ocular inflammation, left, 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   Nonrheumatic aortic valve  insufficiency    CAD in native artery    TIA (transient ischemic attack) 10/22/2013   HTN (hypertension) 10/22/2013   Hyperlipidemia 10/22/2013   Past Medical History:  Diagnosis Date   Allergy    Arthritis    Bleeding of blood vessel    ext. genitalia area   Blood in stool    Chronic kidney disease    Depression    Glaucoma    Heart murmur    History of chicken pox    History of recurrent UTIs    History of stomach ulcers    Hyperlipidemia    Hypertension    Memory loss    Rheumatic fever    Seizures ([facility])    Stroke ([facility])    TIA (transient ischemic attack) 10/22/2013   Urine incontinence     Family History  Problem Relation Age of Onset   Cancer Mother    Early death Mother    Heart disease Father    Arthritis Father    Early death Father    Hearing loss Father    Hypertension Father    Hyperlipidemia Father    Cancer Sister    Arthritis Sister    Arthritis Brother    Diabetes Brother    Heart attack Brother    Heart disease Brother    Hyperlipidemia Brother    Hypertension  Brother    Diabetes Daughter    Heart disease Daughter    Hyperlipidemia Daughter    Hyperlipidemia Maternal Grandmother    Hypertension Maternal Grandmother    Stroke Maternal Grandmother    Alcohol abuse Maternal Grandfather    Early death Maternal Grandfather        tuberculosis   Arthritis Paternal Grandmother    Alcohol abuse Paternal Grandfather    Heart disease Paternal Grandfather    Hyperlipidemia Paternal Grandfather    Hypertension Paternal Grandfather    Stroke Paternal Grandfather    Alcohol abuse Sister    COPD Sister    Drug abuse Sister    Early death Sister    Heart disease Sister    Hypertension Sister    Hyperlipidemia Sister    Early death Brother    Early death Brother     Past Surgical History:  Procedure Laterality Date   ABDOMINAL HYSTERECTOMY     APPENDECTOMY  1983   CATARACT EXTRACTION Right May 13, 2014   EYE SURGERY  12/13/2018   stent  inserted in left eye   gum transplant     RIGHT/LEFT HEART CATH AND CORONARY ANGIOGRAPHY N/A 05/12/2018   Procedure: RIGHT/LEFT HEART CATH AND CORONARY ANGIOGRAPHY;  Surgeon: Belva Crome, MD;  Location: [facility];  Service: Cardiovascular;  Laterality: N/A;   THORACIC AORTOGRAM N/A 05/12/2018   Procedure: THORACIC AORTOGRAM;  Surgeon: Belva Crome, MD;  Location: [facility];  Service: Cardiovascular;  Laterality: N/A;   TONSILLECTOMY     Social History   Occupational History    Employer: RETIRED  Tobacco Use   Smoking status: Former    Years: 40.00    Types: Cigarettes    Quit date: 11/05/1971    Years since quitting: 49.9   Smokeless tobacco: Never  Vaping Use   Vaping Use: Never used  Substance and Sexual Activity   Alcohol use: No    Alcohol/week: 0.0 standard drinks   Drug use: No   Sexual activity: Not Currently

## 2021-09-23 NOTE — Addendum Note (Signed)
Addended by: Precious Bard on: 09/23/2021 03:31 PM   Modules accepted: Orders

## 2021-09-24 ENCOUNTER — Telehealth: Payer: Self-pay | Admitting: Physical Medicine and Rehabilitation

## 2021-09-24 NOTE — Telephone Encounter (Signed)
Patient called needing to schedule an appointment with Dr. Ernestina Patches for her back. The number to contact patient is [phone]

## 2021-09-25 ENCOUNTER — Telehealth: Payer: Self-pay

## 2021-09-25 NOTE — Telephone Encounter (Signed)
Requesting permission for pt to stop BT for seven days before her inj appointment.

## 2021-09-29 ENCOUNTER — Telehealth: Payer: Self-pay | Admitting: Pulmonary Disease

## 2021-09-29 NOTE — Telephone Encounter (Signed)
Called and spoke with Patient.  Patient requested PFT results from 09/23/21. Patient requested results this week, but is aware Dr. Silas Flood is out of the office.  Message routed to Dr. Elsworth Soho (Doc of the day)

## 2021-09-29 NOTE — Telephone Encounter (Signed)
Called and spoke with Patient.  Dr. Bari Mantis results given for PFT. Understanding stated.  Nothing further at this time.

## 2021-10-01 DIAGNOSIS — M0600 Rheumatoid arthritis without rheumatoid factor, unspecified site: Secondary | ICD-10-CM | POA: Diagnosis not present

## 2021-10-02 ENCOUNTER — Other Ambulatory Visit: Payer: Self-pay | Admitting: Family Medicine

## 2021-10-02 ENCOUNTER — Telehealth: Payer: Self-pay | Admitting: Physician Assistant

## 2021-10-02 NOTE — Chronic Care Management (AMB) (Signed)
  Care Management  Note   10/02/2021 Name: Cheryl Wood MRN: [id number] DOB: Sep 21, 1936  Cheryl Wood is a 85 y.o. year old female who is a primary care patient of Allwardt, Randa Evens, PA-C. The care management team was consulted for assistance with chronic disease management and care coordination needs.   [name] was given information about Care Management services today including:  CCM service includes personalized support from designated clinical staff supervised by the physician, including individualized plan of care and coordination with other care providers 24/7 contact phone numbers for assistance for urgent and routine care needs. Service will only be billed when office clinical staff spend 20 minutes or more in a month to coordinate care. Only one practitioner may furnish and bill the service in a calendar month. The patient may stop CCM services at amy time (effective at the end of the month) by phone call to the office staff. The patient will be responsible for cost sharing (co-pay) or up to 20% of the service fee (after annual deductible is met)  Patient agreed to services and verbal consent obtained.  Follow up plan:   An initial telephone outreach has been scheduled for: 11/11/21 _0   Noelle Penner Upstream Scheduler

## 2021-10-02 NOTE — Chronic Care Management (AMB) (Signed)
  Care Management   Follow Up Note   10/02/2021 Name: Cheryl Wood MRN: [id number] DOB: 01-04-1936   Referred by: Allwardt, Randa Evens, PA-C Reason for referral : No chief complaint on file.   An unsuccessful telephone outreach was attempted today. The patient was referred to the case management team for assistance with care management and care coordination.   Follow Up Plan: No further follow up required:    [place]

## 2021-10-03 DIAGNOSIS — H4041X1 Glaucoma secondary to eye inflammation, right eye, mild stage: Secondary | ICD-10-CM | POA: Diagnosis not present

## 2021-10-03 DIAGNOSIS — H4042X2 Glaucoma secondary to eye inflammation, left eye, moderate stage: Secondary | ICD-10-CM | POA: Diagnosis not present

## 2021-10-08 ENCOUNTER — Ambulatory Visit: Payer: Self-pay

## 2021-10-08 ENCOUNTER — Ambulatory Visit (INDEPENDENT_AMBULATORY_CARE_PROVIDER_SITE_OTHER): Payer: Medicare HMO | Admitting: Physical Medicine and Rehabilitation

## 2021-10-08 ENCOUNTER — Other Ambulatory Visit: Payer: Self-pay | Admitting: Family Medicine

## 2021-10-08 ENCOUNTER — Encounter: Payer: Self-pay | Admitting: Physical Medicine and Rehabilitation

## 2021-10-08 ENCOUNTER — Other Ambulatory Visit: Payer: Self-pay

## 2021-10-08 VITALS — BP 118/57 | HR 76

## 2021-10-08 DIAGNOSIS — M5416 Radiculopathy, lumbar region: Secondary | ICD-10-CM | POA: Diagnosis not present

## 2021-10-08 DIAGNOSIS — M4156 Other secondary scoliosis, lumbar region: Secondary | ICD-10-CM

## 2021-10-08 DIAGNOSIS — M48061 Spinal stenosis, lumbar region without neurogenic claudication: Secondary | ICD-10-CM

## 2021-10-08 MED ORDER — BETAMETHASONE SOD PHOS & ACET 6 (3-3) MG/ML IJ SUSP
12.0000 mg | Freq: Once | INTRAMUSCULAR | Status: AC
  Administered 2021-10-08: 12 mg

## 2021-10-08 NOTE — Patient Instructions (Signed)

## 2021-10-08 NOTE — Progress Notes (Signed)
Pt state lower back pain that travels down her left leg. Pt state any pressure on her left hip makes the pain worse. Pt state she takes over the counter pain meds and uses pain cream to help ease her.  Numeric Pain Rating Scale and Functional Assessment Average Pain 8   In the last MONTH (on 0-10 scale) has pain interfered with the following?  1. General activity like being  able to carry out your everyday physical activities such as walking, climbing stairs, carrying groceries, or moving a chair?  Rating(10)   +Driver, +BT pt has stopped her BT, -Dye Allergies.

## 2021-10-13 ENCOUNTER — Other Ambulatory Visit: Payer: Self-pay | Admitting: Physician Assistant

## 2021-10-13 ENCOUNTER — Other Ambulatory Visit: Payer: Self-pay

## 2021-10-13 ENCOUNTER — Telehealth: Payer: Self-pay

## 2021-10-13 ENCOUNTER — Encounter: Payer: Self-pay | Admitting: Pulmonary Disease

## 2021-10-13 ENCOUNTER — Ambulatory Visit (INDEPENDENT_AMBULATORY_CARE_PROVIDER_SITE_OTHER): Payer: Medicare HMO | Admitting: Pulmonary Disease

## 2021-10-13 VITALS — BP 122/88 | HR 92 | Temp 98.0°F | Ht 61.0 in | Wt 169.0 lb

## 2021-10-13 DIAGNOSIS — I351 Nonrheumatic aortic (valve) insufficiency: Secondary | ICD-10-CM | POA: Diagnosis not present

## 2021-10-13 DIAGNOSIS — R0609 Other forms of dyspnea: Secondary | ICD-10-CM | POA: Diagnosis not present

## 2021-10-13 MED ORDER — DAPAGLIFLOZIN PROPANEDIOL 5 MG PO TABS: ORAL_TABLET | ORAL | 0 refills | Status: DC

## 2021-10-13 NOTE — Patient Instructions (Addendum)
Nice to see you today  The lung tests and pictures of the lungs are very reassuring.  No need for follow up

## 2021-10-13 NOTE — Telephone Encounter (Signed)
LAST APPOINTMENT DATE:  09/10/21  NEXT APPOINTMENT DATE: 12/15/21  MEDICATION:Farxiga  PHARMACY: [facility] [id number] - [place], [place]

## 2021-10-13 NOTE — Telephone Encounter (Signed)
Rx sent in

## 2021-10-13 NOTE — Progress Notes (Signed)
@Patient  ID: Cheryl Wood, female    DOB: 06-25-1936, 85 y.o.   MRN: [id number]  Chief Complaint  Patient presents with   Shortness of Breath    No concerns     Referring provider: Allwardt, Randa Evens, PA-C  HPI:   85 y.o. woman whom we are seeing in follow up for dyspnea on exertion largely related to cardiac causes.    No real change in symptoms.  Tried albuterol in the interim.  Seem to make things worse.  She stopped this.  Symptoms back to baseline.  Reviewed PFTs in detail.  Totally normal.  Reviewed chest imaging in detail.  3 different chest x-rays dating back to 2019.  Largely normal.  Possible left hemidiaphragm elevation.  Discussed role of testing although no real role for treatment.  Further evaluation deferred at this time per patient.  HPI at initial visit:  She describes walking history of dyspnea exertion.  Present for years.  She thinks worse over the last 2 to 3 months.  Mildly worse with exertion although certainly present at rest.  No clear precipitating event or factors.  She denies any time of day when things are better or worse.  No positional changes with things are better or worse.  No environmental seasonal factors to account for worsening symptoms.  Was prescribed albuterol yesterday by her PCP.  Use it twice yesterday.  Did not find it very helpful.  Notably she was on a prednisone course for her eyes in July.  While taking her oral prednisone it did not help her breathing.  She denies any atopic or seasonal allergy symptoms.  She had chest x-ray 08/28/2021 though my review interpretation reveals clear lungs bilaterally with evidence of elevated left hemidiaphragm.  Review of serial chest x-ray dated back to 2019 reveals similar mild elevation left hemidiaphragm.  She had spirometry 2019 as discussed below that were normal.  Reviewed left heart catheterization 2019 that showed elevated LVEDP low 20s.  PMH: Diastolic dysfunction, aortic valve regurgitation, diabetes,  hypertension Surgical history: Hysterectomy, appendectomy, eye surgery Family history: Mother with cancer, father with CAD, hypertension, hyperlipidemia Social history: Lives in [place], former smoker, quit 1970   Questionaires / Pulmonary Flowsheets:   ACT:  No flowsheet data found.  MMRC: No flowsheet data found.  Epworth:  No flowsheet data found.  Tests:   FENO:  No results found for: NITRICOXIDE  PFT: Spirometry 2019 personally reviewed and interpreted as normal  WALK:  SIX MIN WALK 03/30/2018  Supplimental Oxygen during Test? (L/min) No  Tech Comments: Patient was only able to complete 1.5 laps due to increased SOB and her legs feeling shakey. No O2 was needed during walk.     Imaging: Personally viewed and as per EMR discussion this note MM 3D SCREEN BREAST BILATERAL  Result Date: 09/19/2021 CLINICAL DATA:  Screening. EXAM: DIGITAL SCREENING BILATERAL MAMMOGRAM WITH TOMOSYNTHESIS AND CAD TECHNIQUE: Bilateral screening digital craniocaudal and mediolateral oblique mammograms were obtained. Bilateral screening digital breast tomosynthesis was performed. The images were evaluated with computer-aided detection. COMPARISON:  Previous exam(s). ACR Breast Density Category b: There are scattered areas of fibroglandular density. FINDINGS: There are no findings suspicious for malignancy. IMPRESSION: No mammographic evidence of malignancy. A result letter of this screening mammogram will be mailed directly to the patient. RECOMMENDATION: Screening mammogram in one year. (Code:SM-B-01Y) BI-RADS CATEGORY  1: Negative. Electronically Signed   By: Marin Olp M.D.   On: 09/19/2021 13:01   XR C-ARM NO REPORT  Result Date: 10/08/2021 Please see Notes tab for imaging impression.   Lab Results: Personally reviewed CBC    Component Value Date/Time   WBC 8.8 08/28/2021 1011   RBC 4.01 08/28/2021 1011   HGB 13.3 08/28/2021 1011   HCT 39.7 08/28/2021 1011   PLT 124 (L)  08/28/2021 1011   MCV 99.0 08/28/2021 1011   MCH 33.2 08/28/2021 1011   MCHC 33.5 08/28/2021 1011   RDW 14.0 08/28/2021 1011   LYMPHSABS 2.4 08/28/2021 1011   MONOABS 0.8 08/28/2021 1011   EOSABS 0.1 08/28/2021 1011   BASOSABS 0.0 08/28/2021 1011    BMET    Component Value Date/Time   NA 141 08/28/2021 1011   NA 144 11/29/2019 0000   K 3.6 08/28/2021 1011   CL 106 08/28/2021 1011   CO2 26 08/28/2021 1011   GLUCOSE 209 (H) 08/28/2021 1011   BUN 24 (H) 08/28/2021 1011   BUN 21 11/29/2019 0000   CREATININE 1.06 (H) 08/28/2021 1011   CREATININE 1.07 (H) 10/03/2020 0854   CALCIUM 9.1 08/28/2021 1011   GFRNONAA 52 (L) 08/28/2021 1011   GFRNONAA 48 (L) 10/03/2020 0854   GFRAA 56 (L) 10/03/2020 0854    BNP    Component Value Date/Time   BNP 97.8 08/28/2021 1011    ProBNP    Component Value Date/Time   PROBNP 73.0 10/13/2019 0838    Specialty Problems   None  Allergies  Allergen Reactions   Dorzolamide Hcl-Timolol Mal Other (See Comments)    Red eyes   Tetanus Toxoids Swelling    Arm was twice the size it should be   Hctz [Hydrochlorothiazide] Rash    Immunization History  Administered Date(s) Administered   Fluad Quad(high Dose 65+) 09/29/2019, 10/03/2020   Influenza Split 08/30/2017   Influenza-Unspecified 09/28/2018   Moderna SARS-COV2 Booster Vaccination 01/31/2021   Moderna Sars-Covid-2 Vaccination 02/09/2020, 03/08/2020, 08/27/2020   Pneumococcal Conjugate-13 03/29/2015   Pneumococcal Polysaccharide-23 11/01/2019    Past Medical History:  Diagnosis Date   Allergy    Arthritis    Bleeding of blood vessel    ext. genitalia area   Blood in stool    Chronic kidney disease    Depression    Glaucoma    Heart murmur    History of chicken pox    History of recurrent UTIs    History of stomach ulcers    Hyperlipidemia    Hypertension    Memory loss    Rheumatic fever    Seizures ([facility])    Stroke ([facility])    TIA (transient ischemic attack)  10/22/2013   Urine incontinence     Tobacco History: Social History   Tobacco Use  Smoking Status Former   Years: 40.00   Types: Cigarettes   Quit date: 11/05/1971   Years since quitting: 49.9  Smokeless Tobacco Never   Counseling given: Not Answered   Continue to not smoke  Outpatient Encounter Medications as of 10/13/2021  Medication Sig   acetaminophen (TYLENOL) 650 MG CR tablet Take 650 mg by mouth every 8 (eight) hours as needed for pain.   amoxicillin (AMOXIL) 500 MG tablet Take 500 mg by mouth 2 (two) times daily. Take 4 tablets at once before dental appointments   b complex vitamins capsule Take 1 capsule by mouth daily.   brimonidine (ALPHAGAN) 0.2 % ophthalmic solution Place 1 drop into both eyes 2 (two) times daily.   clopidogrel (PLAVIX) 75 MG tablet TAKE ONE TABLET BY MOUTH DAILY (Patient taking  differently: Take 75 mg by mouth daily.)   erythromycin ophthalmic ointment Place into the right eye nightly. Per Dr. Edilia Bo, apply to right eye nightly.   erythromycin ophthalmic ointment SMARTSIG:In Eye(s)   estradiol (ESTRACE) 0.1 MG/GM vaginal cream Place 0.5 g vaginally 2 (two) times a week. Place 0.5g twice a week   famciclovir (FAMVIR) 500 MG tablet Take 500 mg by mouth daily.    famotidine (PEPCID) 20 MG tablet TAKE ONE TABLET BY MOUTH TWICE A DAY (Patient taking differently: Take 20 mg by mouth 2 (two) times daily.)   FARXIGA 5 MG TABS tablet TAKE ONE TABLET BY MOUTH DAILY BEFORE BREAKFAST   furosemide (LASIX) 80 MG tablet TAKE TWO TABLETS BY MOUTH DAILY (Patient taking differently: Take 40 mg by mouth 2 (two) times daily.)   glucose blood (CONTOUR NEXT TEST) test strip Use as instructed   inFLIXimab (REMICADE) 100 MG injection Inject 100 mg into the vein every 8 (eight) weeks.   KLOR-CON M20 20 MEQ tablet TAKE ONE TABLET BY MOUTH DAILY   Lancets MISC Check blood sugar once daily. Contour Next Device.   loratadine (CLARITIN) 10 MG tablet Take 10 mg by mouth daily.    losartan (COZAAR) 100 MG tablet TAKE ONE TABLET BY MOUTH DAILY   lovastatin (MEVACOR) 20 MG tablet TAKE ONE TABLET BY MOUTH EVERY EVENING (Patient taking differently: Take 20 mg by mouth at bedtime.)   Multiple Vitamins-Minerals (CENTRUM SILVER 50+WOMEN PO) Take 1 tablet by mouth daily.   mycophenolate (CELLCEPT) 500 MG tablet Take 500 mg by mouth 2 (two) times daily.   pramipexole (MIRAPEX) 0.25 MG tablet TAKE ONE TABLET BY MOUTH EVERY NIGHT AT BEDTIME (Patient taking differently: Take 0.25 mg by mouth daily.)   prednisoLONE acetate (PRED FORTE) 1 % ophthalmic suspension Place 1 drop into both eyes in the morning and at bedtime.   sertraline (ZOLOFT) 25 MG tablet TAKE ONE TABLET BY MOUTH DAILY (Patient taking differently: Take 25 mg by mouth at bedtime.)   timolol (BETIMOL) 0.5 % ophthalmic solution Place 1 drop into the right eye 2 (two) times daily.   Vibegron 75 MG TABS Take 1 tablet by mouth daily.   [DISCONTINUED] albuterol (VENTOLIN HFA) 108 (90 Base) MCG/ACT inhaler Inhale 2 puffs into the lungs every 6 (six) hours as needed for wheezing or shortness of breath.   [DISCONTINUED] predniSONE (STERAPRED UNI-PAK 21 TAB) 10 MG (21) TBPK tablet Take as directed   Facility-Administered Encounter Medications as of 10/13/2021  Medication   betamethasone acetate-betamethasone sodium phosphate (CELESTONE) injection 12 mg     Review of Systems  Review of Systems  N/AA Physical Exam  BP 122/88   Pulse 92   Temp 98 F (36.7 C)   Ht 5\' 1"  (1.549 m)   Wt 169 lb (76.7 kg)   LMP  (LMP Unknown)   SpO2 95%   BMI 31.93 kg/m   Wt Readings from Last 5 Encounters:  10/13/21 169 lb (76.7 kg)  09/11/21 171 lb 3.2 oz (77.7 kg)  09/10/21 172 lb (78 kg)  08/20/21 169 lb 9.6 oz (76.9 kg)  08/20/21 168 lb (76.2 kg)    BMI Readings from Last 5 Encounters:  10/13/21 31.93 kg/m  09/11/21 31.50 kg/m  09/10/21 32.50 kg/m  08/20/21 32.05 kg/m  08/20/21 31.74 kg/m     Physical  Exam General: Sitting in chair, no acute distress Eyes: EOMI, icterus Neck: Supple, no JVP Pulmonary: Distant, clear, normal work of breathing, no wheeze Cardiovascular: Regular rate and  rhythm, harsh 3 out of 6 murmur best heard left upper sternal border Abdomen: Nondistended, bowel sounds present MSK: No synovitis, no joint effusion Neuro: Normal gait, no weakness Psych: Normal mood, full affect  Assessment & Plan:   DOE: Longstanding.  Suspect largely related to cardiac disease given her aortic valve dysfunction as well as elevated LVEDP in the past.  Prior PFTs normal.  Repeat PFTs 08/2021 totally normal.  Left hemidiaphragm looks mildly elevated, possible contributor to symptoms.  Discussed pursuing sniff test although no real intervention for possible left hemidiaphragm paralysis so after shared decision decided not to pursue additional evaluation.  No atopic symptoms, poor response to albuterol.  Low suspicion for asthma.  Recommend ongoing cardiology follow-up.  Diastolic function, Aortic valve regurgitation: Likely drivers of symptoms.  Cardiology follow-up upcoming.   Return if symptoms worsen or fail to improve.   Lanier Clam, MD 10/13/2021

## 2021-10-14 ENCOUNTER — Other Ambulatory Visit: Payer: Self-pay | Admitting: Physician Assistant

## 2021-10-22 ENCOUNTER — Encounter: Payer: Self-pay | Admitting: Cardiovascular Disease

## 2021-10-22 ENCOUNTER — Other Ambulatory Visit: Payer: Self-pay

## 2021-10-22 ENCOUNTER — Ambulatory Visit (INDEPENDENT_AMBULATORY_CARE_PROVIDER_SITE_OTHER): Payer: Medicare HMO | Admitting: Cardiovascular Disease

## 2021-10-22 ENCOUNTER — Telehealth: Payer: Self-pay | Admitting: Physical Medicine and Rehabilitation

## 2021-10-22 VITALS — BP 98/58 | HR 91 | Ht 61.0 in | Wt 168.0 lb

## 2021-10-22 DIAGNOSIS — H4042X2 Glaucoma secondary to eye inflammation, left eye, moderate stage: Secondary | ICD-10-CM | POA: Diagnosis not present

## 2021-10-22 DIAGNOSIS — I10 Essential (primary) hypertension: Secondary | ICD-10-CM

## 2021-10-22 DIAGNOSIS — I2510 Atherosclerotic heart disease of native coronary artery without angina pectoris: Secondary | ICD-10-CM

## 2021-10-22 DIAGNOSIS — R600 Localized edema: Secondary | ICD-10-CM

## 2021-10-22 DIAGNOSIS — E782 Mixed hyperlipidemia: Secondary | ICD-10-CM | POA: Diagnosis not present

## 2021-10-22 DIAGNOSIS — R0609 Other forms of dyspnea: Secondary | ICD-10-CM

## 2021-10-22 DIAGNOSIS — M5416 Radiculopathy, lumbar region: Secondary | ICD-10-CM

## 2021-10-22 DIAGNOSIS — H4041X1 Glaucoma secondary to eye inflammation, right eye, mild stage: Secondary | ICD-10-CM | POA: Diagnosis not present

## 2021-10-22 NOTE — Patient Instructions (Signed)

## 2021-10-22 NOTE — Assessment & Plan Note (Signed)
Cheryl Wood had a right and left heart cath performed by Dr. Tamala Julian for the evaluation of dyspnea 05/12/2018.  She had minimal nonobstructive disease with normal filling pressures.  She had mild AI and increased LVEDP consistent with diastolic dysfunction.

## 2021-10-22 NOTE — Telephone Encounter (Signed)
Pt called stating she got an injection on 10/08/21 with Dr.Newton and was told to [facility] and let him know how its working. Pt states it hasn't helped as much as she thought it would and thinks she may benefit from another one. She would like a CB to set this up please.   [phone]

## 2021-10-22 NOTE — Assessment & Plan Note (Signed)
History of bilateral lower extreme edema probably multifactorial from venous insufficiency and diastolic dysfunction.  She is on high-dose diuretics for this.

## 2021-10-22 NOTE — Progress Notes (Signed)
10/22/2021 Cheryl Wood   November 29, 1936  [id number]  Primary Physician Allwardt, Randa Evens, PA-C Primary Cardiologist: Lorretta Harp MD Garret [facility], [place], [place]  HPI:  Cheryl Wood is a 85 y.o. moderately overweight divorced Caucasian female mother of 2 children, grandmother of 1 grandchild who is accompanied by one of her daughters Roselyn Reef today.  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  She was referred by her primary provider, [name] Allwardt PA-C for chronic dyspnea on exertion.  She was previously a patient of Dr. Tollie Eth who last saw her 05/05/2018 for similar symptoms.  She does have a history of treated hypertension, diabetes and hyperlipidemia.  She is never had a heart attack but did have a stroke back in 2007 with right-sided motor deficits that resolved with prolonged physical therapy.  She complains of dyspnea but denies chest pain.  She did have a 2D echo performed 03/28/2021 that showed normal LV systolic function, grade 1 diastolic dysfunction with mild to moderate AI.  She had a right left heart cath performed by Dr. Tamala Julian 05/12/2018 revealing minimal nonobstructive CAD with a pulmonary capital wedge pressure of 10 and an LVEDP of 23.   Current Meds  Medication Sig   acetaminophen (TYLENOL) 650 MG CR tablet Take 650 mg by mouth every 8 (eight) hours as needed for pain.   brimonidine (ALPHAGAN) 0.2 % ophthalmic solution Place 1 drop into both eyes 2 (two) times daily.   clopidogrel (PLAVIX) 75 MG tablet TAKE ONE TABLET BY MOUTH DAILY (Patient taking differently: Take 75 mg by mouth daily.)   dapagliflozin propanediol (FARXIGA) 5 MG TABS tablet TAKE ONE TABLET BY MOUTH DAILY BEFORE BREAKFAST   erythromycin ophthalmic ointment Place into the right eye nightly. Per Dr. Edilia Bo, apply to right eye nightly.   erythromycin ophthalmic ointment SMARTSIG:In Eye(s)   estradiol (ESTRACE) 0.1 MG/GM vaginal cream Place 0.5 g vaginally 2 (two) times a week. Place 0.5g twice a  week   famciclovir (FAMVIR) 500 MG tablet Take 500 mg by mouth daily.    famotidine (PEPCID) 20 MG tablet TAKE ONE TABLET BY MOUTH TWICE A DAY (Patient taking differently: Take 20 mg by mouth 2 (two) times daily.)   glucose blood (CONTOUR NEXT TEST) test strip Use as instructed   inFLIXimab (REMICADE) 100 MG injection Inject 100 mg into the vein every 8 (eight) weeks.   KLOR-CON M20 20 MEQ tablet TAKE ONE TABLET BY MOUTH DAILY   Lancets MISC Check blood sugar once daily. Contour Next Device.   loratadine (CLARITIN) 10 MG tablet Take 10 mg by mouth daily.   losartan (COZAAR) 100 MG tablet TAKE ONE TABLET BY MOUTH DAILY   lovastatin (MEVACOR) 20 MG tablet TAKE ONE TABLET BY MOUTH EVERY EVENING (Patient taking differently: Take 20 mg by mouth at bedtime.)   Multiple Vitamins-Minerals (CENTRUM SILVER 50+WOMEN PO) Take 1 tablet by mouth daily.   mycophenolate (CELLCEPT) 500 MG tablet Take 500 mg by mouth 2 (two) times daily.   pramipexole (MIRAPEX) 0.25 MG tablet TAKE ONE TABLET BY MOUTH EVERY NIGHT AT BEDTIME (Patient taking differently: Take 0.25 mg by mouth daily.)   prednisoLONE acetate (PRED FORTE) 1 % ophthalmic suspension Place 1 drop into both eyes in the morning and at bedtime.   sertraline (ZOLOFT) 25 MG tablet TAKE ONE TABLET BY MOUTH DAILY (Patient taking differently: Take 25 mg by mouth at bedtime.)   timolol (BETIMOL) 0.5 % ophthalmic solution Place 1 drop into the  right eye 2 (two) times daily.   Vibegron 75 MG TABS Take 1 tablet by mouth daily.   Current Facility-Administered Medications for the 10/22/21 encounter (Office Visit) with Lorretta Harp, MD  Medication   betamethasone acetate-betamethasone sodium phosphate (CELESTONE) injection 12 mg     Allergies  Allergen Reactions   Dorzolamide Hcl-Timolol Mal Other (See Comments)    Red eyes   Tetanus Toxoids Swelling    Arm was twice the size it should be   Hctz [Hydrochlorothiazide] Rash    Social History    Socioeconomic History   Marital status: Divorced    Spouse name: Not on file   Number of children: 2   Years of education: college   Highest education level: Not on file  Occupational History    Employer: RETIRED  Tobacco Use   Smoking status: Former    Years: 40.00    Types: Cigarettes    Quit date: 11/05/1971    Years since quitting: 49.9   Smokeless tobacco: Never  Vaping Use   Vaping Use: Never used  Substance and Sexual Activity   Alcohol use: No    Alcohol/week: 0.0 standard drinks   Drug use: No   Sexual activity: Not Currently  Other Topics Concern   Not on file  Social History Narrative   Patient lives at home alone   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   Worked at [employer] for 18 years   Daughters in [place] and in [place]   Caffeine Use: 2-3 cups daily   Social Determinants of Health   Financial Resource Strain: Low Risk    Difficulty of Paying Living Expenses: Not hard at all  Food Insecurity: No Food Insecurity   Worried About Charity fundraiser in the Last Year: Never true   Arboriculturist in the Last Year: Never true  Transportation Needs: No Transportation Needs   Lack of Transportation (Medical): No   Lack of Transportation (Non-Medical): No  Physical Activity: Inactive   Days of Exercise per Week: 0 days   Minutes of Exercise per Session: 0 min  Stress: No Stress Concern Present   Feeling of Stress : Not at all  Social Connections: Moderately Isolated   Frequency of Communication with Friends and Family: More than three times a week   Frequency of Social Gatherings with Friends and Family: More than three times a week   Attends Religious Services: More than 4 times per year   Active Member of [employer] or Organizations: No   Attends Archivist Meetings: Never   Marital Status: Divorced  Human resources officer Violence: Not At Risk   Fear of Current or Ex-Partner: No   Emotionally Abused: No    Physically Abused: No   Sexually Abused: No     Review of Systems: General: negative for chills, fever, night sweats or weight changes.  Cardiovascular: negative for chest pain, dyspnea on exertion, edema, orthopnea, palpitations, paroxysmal nocturnal dyspnea or shortness of breath Dermatological: negative for rash Respiratory: negative for cough or wheezing Urologic: negative for hematuria Abdominal: negative for nausea, vomiting, diarrhea, bright red blood per rectum, melena, or hematemesis Neurologic: negative for visual changes, syncope, or dizziness All other systems reviewed and are otherwise negative except as noted above.    Blood pressure (!) 98/58, pulse 91, height 5\' 1"  (1.549 m), weight 168 lb (76.2 kg), SpO2 97 %.  General appearance: alert and no distress Neck: no  adenopathy, no carotid bruit, no JVD, supple, symmetrical, trachea midline, and thyroid not enlarged, symmetric, no tenderness/mass/nodules Lungs: clear to auscultation bilaterally Heart: regular rate and rhythm, S1, S2 normal, no murmur, click, rub or gallop Extremities: extremities normal, atraumatic, no cyanosis or edema Pulses: 2+ and symmetric Skin: Skin color, texture, turgor normal. No rashes or lesions Neurologic: Grossly normal  EKG sinus rhythm at 91 with PACs and minimal voltage for LVH.  I personally reviewed this EKG.  ASSESSMENT AND PLAN:   HTN (hypertension) History of essential hypertension blood pressure measured today at 98/58.  She is on losartan.  Hyperlipidemia History of hyperlipidemia on statin therapy with lipid profile performed 07/03/2020 revealing total cholesterol 205, LDL of 132 and HDL 49.  Given her age and lack of CAD I do not think that we need to titrate or change her statin drug.  CAD in native artery Ms. Baena had a right and left heart cath performed by Dr. Tamala Julian for the evaluation of dyspnea 05/12/2018.  She had minimal nonobstructive disease with normal filling  pressures.  She had mild AI and increased LVEDP consistent with diastolic dysfunction.  Dyspnea on exertion Ms. Dettmann was referred by her primary provider for dyspnea exertion.  She has been worked up for this before by Dr. Wynonia Lawman back in May 2019.  A recent 2D echo performed 03/28/2021 revealed normal LV systolic function with grade 1 diastolic dysfunction and mild to moderate aortic insufficiency.  She did have a right left heart cath performed by Dr. Tamala Julian 05/12/2018 revealing minimal nonobstructive CAD with a normal wedge and a slightly elevated LVEDP consistent with diastolic dysfunction.  She is also had pulmonary work-up as well.  I do not think she has a cardiovascular cause to her dyspnea other than diastolic dysfunction for which she is already on high-dose diuretics.  Bilateral lower extremity edema History of bilateral lower extreme edema probably multifactorial from venous insufficiency and diastolic dysfunction.  She is on high-dose diuretics for this.     Lorretta Harp MD FACP,FACC,FAHA, [facility] 10/22/2021 2:26 PM

## 2021-10-22 NOTE — Assessment & Plan Note (Signed)
History of essential hypertension blood pressure measured today at 98/58.  She is on losartan.

## 2021-10-22 NOTE — Assessment & Plan Note (Signed)
Cheryl Wood was referred by her primary provider for dyspnea exertion.  She has been worked up for this before by Dr. Wynonia Lawman back in May 2019.  A recent 2D echo performed 03/28/2021 revealed normal LV systolic function with grade 1 diastolic dysfunction and mild to moderate aortic insufficiency.  She did have a right left heart cath performed by Dr. Tamala Julian 05/12/2018 revealing minimal nonobstructive CAD with a normal wedge and a slightly elevated LVEDP consistent with diastolic dysfunction.  She is also had pulmonary work-up as well.  I do not think she has a cardiovascular cause to her dyspnea other than diastolic dysfunction for which she is already on high-dose diuretics.

## 2021-10-22 NOTE — Assessment & Plan Note (Signed)
History of hyperlipidemia on statin therapy with lipid profile performed 07/03/2020 revealing total cholesterol 205, LDL of 132 and HDL 49.  Given her age and lack of CAD I do not think that we need to titrate or change her statin drug.

## 2021-10-23 ENCOUNTER — Telehealth: Payer: Self-pay | Admitting: Physical Medicine and Rehabilitation

## 2021-10-23 NOTE — Telephone Encounter (Signed)
Left L4-5 IL on 10/12. Ok to repeat if helped, same problem/side, and no new injury?

## 2021-10-23 NOTE — Procedures (Signed)
Lumbar Epidural Steroid Injection - Interlaminar Approach with Fluoroscopic Guidance  Patient: Cheryl Wood      Date of Birth: Oct 27, 1936 MRN: [id number] PCP: Fredirick Lathe, PA-C      Visit Date: 10/08/2021   Universal Protocol:     Consent Given By: the patient  Position: PRONE  Additional Comments: Vital signs were monitored before and after the procedure. Patient was prepped and draped in the usual sterile fashion. The correct patient, procedure, and site was verified.   Injection Procedure Details:   Procedure diagnoses: Lumbar radiculopathy [M54.16]   Meds Administered:  Meds ordered this encounter  Medications   betamethasone acetate-betamethasone sodium phosphate (CELESTONE) injection 12 mg     Laterality: Left  Location/Site:  L4-5  Needle: 3.5 in., 20 ga. Tuohy  Needle Placement: Paramedian epidural  Findings:   -Comments: Excellent flow of contrast into the epidural space.  Procedure Details: Using a paramedian approach from the side mentioned above, the region overlying the inferior lamina was localized under fluoroscopic visualization and the soft tissues overlying this structure were infiltrated with 4 ml. of 1% Lidocaine without Epinephrine. The Tuohy needle was inserted into the epidural space using a paramedian approach.   The epidural space was localized using loss of resistance along with counter oblique bi-planar fluoroscopic views.  After negative aspirate for air, blood, and CSF, a 2 ml. volume of Isovue-250 was injected into the epidural space and the flow of contrast was observed. Radiographs were obtained for documentation purposes.    The injectate was administered into the level noted above.   Additional Comments:  The patient tolerated the procedure well Dressing: 2 x 2 sterile gauze and Band-Aid    Post-procedure details: Patient was observed during the procedure. Post-procedure instructions were reviewed.  Patient left the  clinic in stable condition.

## 2021-10-23 NOTE — Telephone Encounter (Signed)
Patient returned call asked for a call back. [phone]

## 2021-10-23 NOTE — Progress Notes (Signed)
Cheryl Wood - 85 y.o. female MRN [id number]  Date of birth: 09-13-1936  Office Visit Note: Visit Date: 10/08/2021 PCP: Fredirick Lathe, PA-C Referred by: Allwardt, Randa Evens, PA-C  Subjective: Chief Complaint  Patient presents with   Lower Back - Pain   Left Leg - Pain   Left Hip - Pain   HPI:  Cheryl Wood is a 85 y.o. female who comes in today at the request of Dr. Eduard Roux for planned Left L4-5 Lumbar Interlaminar epidural steroid injection with fluoroscopic guidance.  The patient has failed conservative care including home exercise, medications, time and activity modification.  This injection will be diagnostic and hopefully therapeutic.  Please see requesting physician notes for further details and justification. MRI reviewed with images and spine model.  MRI reviewed in the note below.    ROS Otherwise per HPI.  Assessment & Plan: Visit Diagnoses:    ICD-10-CM   1. Lumbar radiculopathy  M54.16 XR C-ARM NO REPORT    Epidural Steroid injection    betamethasone acetate-betamethasone sodium phosphate (CELESTONE) injection 12 mg    2. 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   Meds & Orders:  Meds ordered this encounter  Medications   betamethasone acetate-betamethasone sodium phosphate (CELESTONE) injection 12 mg    Orders Placed This Encounter  Procedures   XR C-ARM NO REPORT   Epidural Steroid injection    Follow-up: Return if symptoms worsen or fail to improve.   Procedures: No procedures performed  Lumbar Epidural Steroid Injection - Interlaminar Approach with Fluoroscopic Guidance  Patient: Cheryl Wood      Date of Birth: December 07, 1936 MRN: [id number] PCP: Fredirick Lathe, PA-C      Visit Date: 10/08/2021   Universal Protocol:     Consent Given By: the patient  Position: PRONE  Additional Comments: Vital signs were monitored before and after  the procedure. Patient was prepped and draped in the usual sterile fashion. The correct patient, procedure, and site was verified.   Injection Procedure Details:   Procedure diagnoses: Lumbar radiculopathy [M54.16]   Meds Administered:  Meds ordered this encounter  Medications   betamethasone acetate-betamethasone sodium phosphate (CELESTONE) injection 12 mg     Laterality: Left  Location/Site:  L4-5  Needle: 3.5 in., 20 ga. Tuohy  Needle Placement: Paramedian epidural  Findings:   -Comments: Excellent flow of contrast into the epidural space.  Procedure Details: Using a paramedian approach from the side mentioned above, the region overlying the inferior lamina was localized under fluoroscopic visualization and the soft tissues overlying this structure were infiltrated with 4 ml. of 1% Lidocaine without Epinephrine. The Tuohy needle was inserted into the epidural space using a paramedian approach.   The epidural space was localized using loss of resistance along with counter oblique bi-planar fluoroscopic views.  After negative aspirate for air, blood, and CSF, a 2 ml. volume of Isovue-250 was injected into the epidural space and the flow of contrast was observed. Radiographs were obtained for documentation purposes.    The injectate was administered into the level noted above.   Additional Comments:  The patient tolerated the procedure well Dressing: 2 x 2 sterile gauze and Band-Aid    Post-procedure details: Patient was observed during the procedure. Post-procedure instructions were reviewed.  Patient left the clinic in stable condition.   Clinical History: MRI  lumbar spine:   TECHNIQUE: Sagittal and axial T1 and T2-weighted sequences were performed. Additional sagittal STIR images were performed.   INDICATION: Back pain   COMPARISON: None available   FINDINGS:  #  Marked Levoscoliosis apex at L1-2.  #  Vertebral body heights are well maintained.  #  There  is type II Modic and plate change seen throughout the lumbar spine..  #  Conus terminates at L1 without evidence of tethering.  #  Nerve roots appear normal.  #  Incidental findings: None.    #  L1-2: Mild degenerative disc disease. Disc bulge without significant central canal stenosis or neuroforaminal narrowing.  #    #  L2-3: Mild degenerative disc disease. Central canal is well-maintained. Neural foramina are patent.  #    #  L3-4: Mild degenerative disc disease. There is left foraminal disc osteophyte causing moderate neural foraminal narrowing. There is mild central canal stenosis.  #    #  L4-5: Moderate degenerative disc disease. There is left-sided facet arthropathy and foraminal spurring causing mild neural foraminal narrowing. No significant central canal stenosis.  #    #  L5-S1: Mild degenerative disc disease. There is facet arthropathy and disc uncovering causing mild left neural foraminal narrowing. No significant central canal stenosis.    IMPRESSION:   1.  Lumbar spondylosis most significant at L3-4 with left foraminal disc osteophyte causing moderate neural foraminal narrowing and mild central canal stenosis reflecting scoliotic curvature.   2.   There is mild left neural foraminal narrowing seen at L4-5 and L5-S1   3.  Marked Levoscoliosis apex at L1-2.   Electronically Signed by: Ritta Slot  Date: 11/14/18     Objective:  VS:  HT:    WT:   BMI:     BP:(!) 118/57  HR:76bpm  TEMP: ( )  RESP:  Physical Exam Vitals and nursing note reviewed.  Constitutional:      General: She is not in acute distress.    Appearance: Normal appearance. She is not ill-appearing.  HENT:     Head: Normocephalic and atraumatic.     Right Ear: External ear normal.     Left Ear: External ear normal.  Eyes:     Extraocular Movements: Extraocular movements intact.  Cardiovascular:     Rate and Rhythm: Normal rate.     Pulses: Normal pulses.  Pulmonary:     Effort:  Pulmonary effort is normal. No respiratory distress.  Abdominal:     General: There is no distension.     Palpations: Abdomen is soft.  Musculoskeletal:        General: Tenderness present.     Cervical back: Neck supple.     Right lower leg: No edema.     Left lower leg: No edema.     Comments: Patient has good distal strength with no pain over the greater trochanters.  No clonus or focal weakness.  Skin:    Findings: No erythema, lesion or rash.  Neurological:     General: No focal deficit present.     Mental Status: She is alert and oriented to person, place, and time.     Sensory: No sensory deficit.     Motor: No weakness or abnormal muscle tone.     Coordination: Coordination normal.  Psychiatric:        Mood and Affect: Mood normal.        Behavior: Behavior normal.     Imaging: No results found.

## 2021-10-24 NOTE — Telephone Encounter (Signed)
Patient scheduled and referral placed.

## 2021-10-24 NOTE — Telephone Encounter (Signed)
See previous message

## 2021-10-28 DIAGNOSIS — H209 Unspecified iridocyclitis: Secondary | ICD-10-CM | POA: Diagnosis not present

## 2021-10-28 DIAGNOSIS — Z961 Presence of intraocular lens: Secondary | ICD-10-CM | POA: Diagnosis not present

## 2021-10-28 DIAGNOSIS — H4042X2 Glaucoma secondary to eye inflammation, left eye, moderate stage: Secondary | ICD-10-CM | POA: Diagnosis not present

## 2021-10-28 DIAGNOSIS — H35033 Hypertensive retinopathy, bilateral: Secondary | ICD-10-CM | POA: Diagnosis not present

## 2021-10-29 DIAGNOSIS — Z79899 Other long term (current) drug therapy: Secondary | ICD-10-CM | POA: Diagnosis not present

## 2021-10-29 DIAGNOSIS — E669 Obesity, unspecified: Secondary | ICD-10-CM | POA: Diagnosis not present

## 2021-10-29 DIAGNOSIS — M159 Polyosteoarthritis, unspecified: Secondary | ICD-10-CM | POA: Diagnosis not present

## 2021-10-29 DIAGNOSIS — M5432 Sciatica, left side: Secondary | ICD-10-CM | POA: Diagnosis not present

## 2021-10-29 DIAGNOSIS — Z6830 Body mass index (BMI) 30.0-30.9, adult: Secondary | ICD-10-CM | POA: Diagnosis not present

## 2021-10-29 DIAGNOSIS — M0600 Rheumatoid arthritis without rheumatoid factor, unspecified site: Secondary | ICD-10-CM | POA: Diagnosis not present

## 2021-10-29 DIAGNOSIS — H209 Unspecified iridocyclitis: Secondary | ICD-10-CM | POA: Diagnosis not present

## 2021-10-29 DIAGNOSIS — M2550 Pain in unspecified joint: Secondary | ICD-10-CM | POA: Diagnosis not present

## 2021-11-03 ENCOUNTER — Ambulatory Visit: Payer: Self-pay

## 2021-11-03 ENCOUNTER — Encounter: Payer: Self-pay | Admitting: Physical Medicine and Rehabilitation

## 2021-11-03 ENCOUNTER — Ambulatory Visit (INDEPENDENT_AMBULATORY_CARE_PROVIDER_SITE_OTHER): Payer: Medicare HMO | Admitting: Physical Medicine and Rehabilitation

## 2021-11-03 ENCOUNTER — Other Ambulatory Visit: Payer: Self-pay

## 2021-11-03 VITALS — BP 121/74 | HR 75

## 2021-11-03 DIAGNOSIS — M5416 Radiculopathy, lumbar region: Secondary | ICD-10-CM | POA: Diagnosis not present

## 2021-11-03 DIAGNOSIS — M48061 Spinal stenosis, lumbar region without neurogenic claudication: Secondary | ICD-10-CM | POA: Diagnosis not present

## 2021-11-03 DIAGNOSIS — M419 Scoliosis, unspecified: Secondary | ICD-10-CM

## 2021-11-03 MED ORDER — METHYLPREDNISOLONE ACETATE 80 MG/ML IJ SUSP
80.0000 mg | Freq: Once | INTRAMUSCULAR | Status: AC
  Administered 2021-11-03: 80 mg

## 2021-11-03 NOTE — Patient Instructions (Signed)

## 2021-11-03 NOTE — Progress Notes (Signed)
Pt state lower back pain that travels down her left leg. Pt state walking, standing and laying down makes the pain worse. Pt state she takes over the counter pain meds and uses heat to help ease her pain. Pt has hx of inj on 10/08/21 pt state it didn't help.  Numeric Pain Rating Scale and Functional Assessment Average Pain 7   In the last MONTH (on 0-10 scale) has pain interfered with the following?  1. General activity like being  able to carry out your everyday physical activities such as walking, climbing stairs, carrying groceries, or moving a chair?  Rating(10)   +Driver, +BT, -Dye Allergies.

## 2021-11-05 ENCOUNTER — Telehealth: Payer: Self-pay | Admitting: Physician Assistant

## 2021-11-05 NOTE — Progress Notes (Signed)
  Care Management  Note   11/05/2021 Name: JERUSALEN MATEJA MRN: [id number] DOB: 12/07/1936  ERZA MOTHERSHEAD is a 85 y.o. year old female who is a primary care patient of Allwardt, Randa Evens, PA-C. The care management team was consulted for assistance with chronic disease management and care coordination needs.   Ms. Cliburn was given information about Care Management services today including:  CCM service includes personalized support from designated clinical staff supervised by the physician, including individualized plan of care and coordination with other care providers 24/7 contact phone numbers for assistance for urgent and routine care needs. Service will only be billed when office clinical staff spend 20 minutes or more in a month to coordinate care. Only one practitioner may furnish and bill the service in a calendar month. The patient may stop CCM services at amy time (effective at the end of the month) by phone call to the office staff. The patient will be responsible for cost sharing (co-pay) or up to 20% of the service fee (after annual deductible is met)  Patient agreed to services and verbal consent obtained.  Follow up plan:   An initial telephone outreach has been scheduled for: 11/11/21 $RemoveBefor'@11am'[email or web address]$   [employer]

## 2021-11-09 NOTE — Procedures (Signed)
Lumbosacral Transforaminal Epidural Steroid Injection - Sub-Pedicular Approach with Fluoroscopic Guidance  Patient: Cheryl Wood      Date of Birth: 03/15/1936 MRN: [id number] PCP: Fredirick Lathe, PA-C      Visit Date: 11/03/2021   Universal Protocol:    Date/Time: 11/03/2021  Consent Given By: the patient  Position: PRONE  Additional Comments: Vital signs were monitored before and after the procedure. Patient was prepped and draped in the usual sterile fashion. The correct patient, procedure, and site was verified.   Injection Procedure Details:   Procedure diagnoses: Lumbar radiculopathy [M54.16]    Meds Administered:  Meds ordered this encounter  Medications   methylPREDNISolone acetate (DEPO-MEDROL) injection 80 mg    Laterality: Left  Location/Site: L3  Needle:5.0 in., 22 ga.  Short bevel or Quincke spinal needle  Needle Placement: Transforaminal  Findings:    -Comments: Excellent flow of contrast along the nerve, nerve root and into the epidural space.  Procedure Details: After squaring off the end-plates to get a true AP view, the C-arm was positioned so that an oblique view of the foramen as noted above was visualized. The target area is just inferior to the "nose of the scotty dog" or sub pedicular. The soft tissues overlying this structure were infiltrated with 2-3 ml. of 1% Lidocaine without Epinephrine.  The spinal needle was inserted toward the target using a "trajectory" view along the fluoroscope beam.  Under AP and lateral visualization, the needle was advanced so it did not puncture dura and was located close the 6 O'Clock position of the pedical in AP tracterory. Biplanar projections were used to confirm position. Aspiration was confirmed to be negative for CSF and/or blood. A 1-2 ml. volume of Isovue-250 was injected and flow of contrast was noted at each level. Radiographs were obtained for documentation purposes.   After attaining the desired  flow of contrast documented above, a 0.5 to 1.0 ml test dose of 0.25% Marcaine was injected into each respective transforaminal space.  The patient was observed for 90 seconds post injection.  After no sensory deficits were reported, and normal lower extremity motor function was noted,   the above injectate was administered so that equal amounts of the injectate were placed at each foramen (level) into the transforaminal epidural space.   Additional Comments:  The patient tolerated the procedure well Dressing: 2 x 2 sterile gauze and Band-Aid    Post-procedure details: Patient was observed during the procedure. Post-procedure instructions were reviewed.  Patient left the clinic in stable condition.

## 2021-11-09 NOTE — Progress Notes (Signed)
Cheryl Wood - 85 y.o. female MRN [id number]  Date of birth: 1936-01-30  Office Visit Note: Visit Date: 11/03/2021 PCP: Fredirick Lathe, PA-C Referred by: Allwardt, Randa Evens, PA-C  Subjective: Chief Complaint  Patient presents with   Lower Back - Pain   Left Leg - Pain   HPI:  Cheryl Wood is a 85 y.o. female who comes in today  for planned Left L3-4 Lumbar Transforaminal epidural steroid injection with fluoroscopic guidance.  The patient has failed conservative care including home exercise, medications, time and activity modification.  This injection will be diagnostic and hopefully therapeutic.  Please see requesting physician notes for further details and justification.  Prior interlaminar approach did not seem to give much relief.  She has pretty significant left-sided foraminal narrowing and will attempt an L3 transforaminal injection.  Her case is difficult with severe junctional scoliosis at this level.  ROS Otherwise per HPI.  Assessment & Plan: Visit Diagnoses:    ICD-10-CM   1. Lumbar radiculopathy  M54.16 XR C-ARM NO REPORT    Epidural Steroid injection    methylPREDNISolone acetate (DEPO-MEDROL) injection 80 mg    2. Foraminal stenosis of lumbar region  M48.061 XR C-ARM NO REPORT    Epidural Steroid injection    methylPREDNISolone acetate (DEPO-MEDROL) injection 80 mg    3. Scoliosis of thoracolumbar spine, unspecified scoliosis type  M41.9       Plan: No additional findings.   Meds & Orders:  Meds ordered this encounter  Medications   methylPREDNISolone acetate (DEPO-MEDROL) injection 80 mg    Orders Placed This Encounter  Procedures   XR C-ARM NO REPORT   Epidural Steroid injection    Follow-up: Return if symptoms worsen or fail to improve.   Procedures: No procedures performed  Lumbosacral Transforaminal Epidural Steroid Injection - Sub-Pedicular Approach with Fluoroscopic Guidance  Patient: Cheryl Wood      Date of Birth: [birth year] MRN:  [id number] PCP: Fredirick Lathe, PA-C      Visit Date: 11/03/2021   Universal Protocol:    Date/Time: 11/03/2021  Consent Given By: the patient  Position: PRONE  Additional Comments: Vital signs were monitored before and after the procedure. Patient was prepped and draped in the usual sterile fashion. The correct patient, procedure, and site was verified.   Injection Procedure Details:   Procedure diagnoses: Lumbar radiculopathy [M54.16]    Meds Administered:  Meds ordered this encounter  Medications   methylPREDNISolone acetate (DEPO-MEDROL) injection 80 mg    Laterality: Left  Location/Site: L3  Needle:5.0 in., 22 ga.  Short bevel or Quincke spinal needle  Needle Placement: Transforaminal  Findings:    -Comments: Excellent flow of contrast along the nerve, nerve root and into the epidural space.  Procedure Details: After squaring off the end-plates to get a true AP view, the C-arm was positioned so that an oblique view of the foramen as noted above was visualized. The target area is just inferior to the "nose of the scotty dog" or sub pedicular. The soft tissues overlying this structure were infiltrated with 2-3 ml. of 1% Lidocaine without Epinephrine.  The spinal needle was inserted toward the target using a "trajectory" view along the fluoroscope beam.  Under AP and lateral visualization, the needle was advanced so it did not puncture dura and was located close the 6 O'Clock position of the pedical in AP tracterory. Biplanar projections were used to confirm position. Aspiration was confirmed to be negative for CSF and/or blood.  A 1-2 ml. volume of Isovue-250 was injected and flow of contrast was noted at each level. Radiographs were obtained for documentation purposes.   After attaining the desired flow of contrast documented above, a 0.5 to 1.0 ml test dose of 0.25% Marcaine was injected into each respective transforaminal space.  The patient was observed for 90  seconds post injection.  After no sensory deficits were reported, and normal lower extremity motor function was noted,   the above injectate was administered so that equal amounts of the injectate were placed at each foramen (level) into the transforaminal epidural space.   Additional Comments:  The patient tolerated the procedure well Dressing: 2 x 2 sterile gauze and Band-Aid    Post-procedure details: Patient was observed during the procedure. Post-procedure instructions were reviewed.  Patient left the clinic in stable condition.     Clinical History: MRI lumbar spine:   TECHNIQUE: Sagittal and axial T1 and T2-weighted sequences were performed. Additional sagittal STIR images were performed.   INDICATION: Back pain   COMPARISON: None available   FINDINGS:  #  Marked Levoscoliosis apex at L1-2.  #  Vertebral body heights are well maintained.  #  There is type II Modic and plate change seen throughout the lumbar spine..  #  Conus terminates at L1 without evidence of tethering.  #  Nerve roots appear normal.  #  Incidental findings: None.    #  L1-2: Mild degenerative disc disease. Disc bulge without significant central canal stenosis or neuroforaminal narrowing.  #    #  L2-3: Mild degenerative disc disease. Central canal is well-maintained. Neural foramina are patent.  #    #  L3-4: Mild degenerative disc disease. There is left foraminal disc osteophyte causing moderate neural foraminal narrowing. There is mild central canal stenosis.  #    #  L4-5: Moderate degenerative disc disease. There is left-sided facet arthropathy and foraminal spurring causing mild neural foraminal narrowing. No significant central canal stenosis.  #    #  L5-S1: Mild degenerative disc disease. There is facet arthropathy and disc uncovering causing mild left neural foraminal narrowing. No significant central canal stenosis.    IMPRESSION:   1.  Lumbar spondylosis most significant at L3-4 with  left foraminal disc osteophyte causing moderate neural foraminal narrowing and mild central canal stenosis reflecting scoliotic curvature.   2.   There is mild left neural foraminal narrowing seen at L4-5 and L5-S1   3.  Marked Levoscoliosis apex at L1-2.   Electronically Signed by: Ritta Slot  Date: 11/14/18     Objective:  VS:  HT:    WT:   BMI:     BP:121/74  HR:75bpm  TEMP: ( )  RESP:  Physical Exam Vitals and nursing note reviewed.  Constitutional:      General: She is not in acute distress.    Appearance: Normal appearance. She is not ill-appearing.  HENT:     Head: Normocephalic and atraumatic.     Right Ear: External ear normal.     Left Ear: External ear normal.  Eyes:     Extraocular Movements: Extraocular movements intact.  Cardiovascular:     Rate and Rhythm: Normal rate.     Pulses: Normal pulses.  Pulmonary:     Effort: Pulmonary effort is normal. No respiratory distress.  Abdominal:     General: There is no distension.     Palpations: Abdomen is soft.  Musculoskeletal:        General:  Tenderness present.     Cervical back: Neck supple.     Right lower leg: No edema.     Left lower leg: No edema.     Comments: Patient has good distal strength with no pain over the greater trochanters.  No clonus or focal weakness.  Skin:    Findings: No erythema, lesion or rash.  Neurological:     General: No focal deficit present.     Mental Status: She is alert and oriented to person, place, and time.     Sensory: No sensory deficit.     Motor: No weakness or abnormal muscle tone.     Coordination: Coordination normal.  Psychiatric:        Mood and Affect: Mood normal.        Behavior: Behavior normal.     Imaging: No results found.

## 2021-11-10 ENCOUNTER — Telehealth: Payer: Self-pay | Admitting: Pharmacist

## 2021-11-10 NOTE — Chronic Care Management (AMB) (Signed)
Chronic Care Management Pharmacy Assistant   Name: Cheryl Wood  MRN: [id number] DOB: 1936-09-15   Reason for Encounter: Chart Review For Initial Visit With Clinical Pharmacist   Conditions to be addressed/monitored: TIA, HTN, CAD, CVA, Lumbar radiculopathy, Rheumatoid arthritis, Hyperlipidemia, RLS, Depression,   Primary concerns for visit include: HTN, HLD, Lumbar Radiculopathy  Recent office visits:  09/10/2021 [facility] (PCP) Allwardt, [name] M, PA-C; no medication changes indicated.  08/20/2021 [facility] (PCP) Allwardt, [name] M, PA-C; no medication changes indicated.  08/15/2021 [facility] (PCP) Allwardt, [name] M, PA-C;-Will start on Farxiga 5 mg at this time. SE discussed  07/02/2021 Hope Budds, FNP; Tramadol as needed for very intense pain  Recent consult visits:  10/28/2021 [facility] (ophthalmology) Dala Dock, MD;  Pred x1 OU Emycin ointment prn OD before bedtime Timolol x2 OD Brimonidine x2 OU  Recommend ATs prn Recommend Vaseline  Return in about 1 month (around 11/22/2021).  10/22/2021 [facility] (cardiology) Jeanann Lewandowsky, MD; no medication changes indicated.  10/22/2021 [facility] (ophthalmology) Bond, Tracie Harrier, MD;  Epi irreg resolved today OD.  Will decrease pred to x1 for a week then qod   10/13/2021 [facility] (pulmonology) Hunsucker, Bonna Gains, MD; no medication changes indicated.  09/23/2021 [facility] (orthopedics) Leandrew Koyanagi, MD; no medication changes indicated.  09/11/2021 [facility] (pulmonology) Hunsucker, Bonna Gains, MD; Use albuterol in the morning in the afternoons, spaced 6 hours apart, through the weekend.  Please report back on Monday if this has been helpful or not as this will be informative in our next steps.  08/20/2021 [facility] (urogyn) Jaquita Folds, MD;  - Continue #0 incontinence ring pessary. She will keep the pessary in place until next visit.  - Continue estrogen cream twice a week. Can place at introitus/ in vagina as well as inner labia. - Continue with  Gemtesa 75mg  daily.  08/19/2021 [facility] (ophthalmology) Dala Dock, MD;  - Discussed starting MTX and discussed r/b.  - D/c 10 mg weekly MTX with 1 mg folic acid daily on 07/29/49 - not tolerating MTX due to nausea and sore throat  - D/c Azathioprine 25 mg daily (due to nausea)  - Prescribed Zofran for nausea - Tapered off 5 mg oral Prednisone daily - I recommend starting Cellcept. I have discussed side effects and patient consents to proceed on 07/30/21.  - If she fails Cellcept, I will contemplate adding Acthar.  - Continue oral prednisone pulse (started on 07/30/21) - 40 mg daily for 2 weeks, 30 mg daily for 1 week, 20 mg daily for 1 week, 10 mg daily for 1 week and then d/c - Patient self d/c Cellcept 500 mg BID (started on 07/30/21) - start 500 mg daily today 08/19/21 - Currently on Remicade infusions per Marella Chimes PA-C - Continue Famvir 500 mg daily (decreased from TID on 09/06/18)  - Currently on PF BID OD per Dr. Edilia Bo  08/04/2021 [facility] (ophthalmology) Edilia Bo, Tracie Harrier, MD;  Restart pred x2 OD only Start emycin ointment OD before bedtime Timolol x2 OD Brimonidine x2 OU  Recommend ATs prn Recommend Vaseline  Return in 9 weeks (on 10/06/2021).  07/30/2021 [facility] (ophthalmology) Dala Dock, MD;  Timolol x2 OD Pred x3 OU - per rs Brimonidine x2 OU  Recommend ATs prn Recommend Vaseline   07/24/2021 [facility] (ophthalmology) Bond, Tracie Harrier, MD;  Timolol x2 OD Pred x3 OU - per rs Brimonidine x2 OU  Recommend ATs prn Recommend Vaseline   07/15/2021 [facility] (orthopedics) Leandrew Koyanagi, MD;  Rx prednisone dosepak as directed  06/10/2021 [facility] (dermatology) Rosendo Gros, MD; no further information available  Hospital visits:  08/28/2021 ED Visit for Shortness of Breath No Medication Changes Indicated  Medications: Outpatient Encounter Medications as of 11/10/2021  Medication Sig   acetaminophen (TYLENOL) 650 MG CR tablet Take 650 mg by mouth every 8 (eight) hours as needed for  pain.   b complex vitamins capsule Take 1 capsule by mouth daily.   brimonidine (ALPHAGAN) 0.2 % ophthalmic solution Place 1 drop into both eyes 2 (two) times daily.   clopidogrel (PLAVIX) 75 MG tablet TAKE ONE TABLET BY MOUTH DAILY (Patient taking differently: Take 75 mg by mouth daily.)   dapagliflozin propanediol (FARXIGA) 5 MG TABS tablet TAKE ONE TABLET BY MOUTH DAILY BEFORE BREAKFAST   erythromycin ophthalmic ointment Place into the right eye nightly. Per Dr. Edilia Bo, apply to right eye nightly.   erythromycin ophthalmic ointment SMARTSIG:In Eye(s)   estradiol (ESTRACE) 0.1 MG/GM vaginal cream Place 0.5 g vaginally 2 (two) times a week. Place 0.5g twice a week   famciclovir (FAMVIR) 500 MG tablet Take 500 mg by mouth daily.    famotidine (PEPCID) 20 MG tablet TAKE ONE TABLET BY MOUTH TWICE A DAY (Patient taking differently: Take 20 mg by mouth 2 (two) times daily.)   furosemide (LASIX) 80 MG tablet TAKE TWO TABLETS BY MOUTH DAILY   glucose blood (CONTOUR NEXT TEST) test strip Use as instructed   inFLIXimab (REMICADE) 100 MG injection Inject 100 mg into the vein every 8 (eight) weeks.   KLOR-CON M20 20 MEQ tablet TAKE ONE TABLET BY MOUTH DAILY   Lancets MISC Check blood sugar once daily. Contour Next Device.   loratadine (CLARITIN) 10 MG tablet Take 10 mg by mouth daily.   losartan (COZAAR) 100 MG tablet TAKE ONE TABLET BY MOUTH DAILY   lovastatin (MEVACOR) 20 MG tablet TAKE ONE TABLET BY MOUTH EVERY EVENING (Patient taking differently: Take 20 mg by mouth at bedtime.)   Multiple Vitamins-Minerals (CENTRUM SILVER 50+WOMEN PO) Take 1 tablet by mouth daily.   mycophenolate (CELLCEPT) 500 MG tablet Take 500 mg by mouth 2 (two) times daily.   pramipexole (MIRAPEX) 0.25 MG tablet TAKE ONE TABLET BY MOUTH EVERY NIGHT AT BEDTIME (Patient taking differently: Take 0.25 mg by mouth daily.)   prednisoLONE acetate (PRED FORTE) 1 % ophthalmic suspension Place 1 drop into both eyes in the morning and at  bedtime.   sertraline (ZOLOFT) 25 MG tablet TAKE ONE TABLET BY MOUTH DAILY (Patient taking differently: Take 25 mg by mouth at bedtime.)   timolol (BETIMOL) 0.5 % ophthalmic solution Place 1 drop into the right eye 2 (two) times daily.   Vibegron 75 MG TABS Take 1 tablet by mouth daily.   No facility-administered encounter medications on file as of 11/10/2021.   Current Medications: Farxiga 5 mg last filled 10/17/2021 30 DS Erythromycin ophthalmic ointment last filled 08/29/2021 7 DS Cellcept 500 mg last filled 10/03/2021 30 DS Losartan 100 mg last filled 10/08/2021 90 DS Klor-Con M20 20 meq last filled 10/02/2021 90 DS Timolol 0.5% ophthalmic solution last filled 11/19/2020 50 DS Vibegron 75 mg last filled 11/05/2021 30 DS Lancets last filled 08/18/2021 90 DS Contour Next Test test strips Mirapex 0.25 mg last filled 11/04/2021 90 DS Plavix 75 mg last filled 10/24/2021 90 DS Furosemide 80 mg last filled 10/02/2021 90 DS Estradiol 0.1mg /gm vaginal cream last filled 08/23/2021 73 DS Brimonidine 0.2% ophthalmic solution last filled 11/10/2021 90 DS Multiple Vitamins-Minerals (Centrum  Silver) B Complex - no longer taking Prednisolone acetate 1% ophthalmic susp last filled 11/10/2021 12 DS Lovastatin 20 mg last filled 10/06/2021 90 DS Sertraline 25 mg last filled 08/19/2021 90 DS Infliximab 100 mg injection Acetaminophen 650 mg Famciclovir 500 mg last filled 10/15/2021 90 DS Loratadine 10 mg  Patient Questions: Any changes in your medications or health? Patient states she had to have an injection in her back for sciatica pain. She states it helped some but still hurts at times when she stands for a long period of time.  Any side effects from any medications?  Patient denies having any side effects from any of her medications.  Do you have any symptoms or problems not managed by your medications? Patient denies having any symptoms or problems that are not currently managed by her  medications.  Any concerns about your health right now? Patient states she doesn't have any concerns about her health at this time.  Has your provider asked that you check blood pressure, blood sugar, or follow special diet at home? She states she checks both blood pressure and blood sugars at home. She tries to avoid sugar and processed foods. She states she eats a fairly healthy diet.  Do you get any type of exercise on a regular basis? Hasn't done much in the last few months, is regularly very active. She works 3 days a week at a [employer].   Can you think of a goal you would like to reach for your health? Patient states she would like to "get rid of this pain in my hip."  Do you have any problems getting your medications? Patient states she does not have any problems getting her medications.  Is there anything that you would like to discuss during the appointment?  Patient states she would like to discuss her medications. Nothing in particular.  Please bring medications and supplements to appointment  Care Gaps: Medicare Annual Wellness: Completed Ophthalmology Exam: Next due on 12/27/2021 Foot Exam: Overdue since 10/31/2020 Hemoglobin A1C: 8.1% on 08/15/2021 Colonoscopy: Aged out Dexa Scan: Completed Mammogram: Next due on 09/15/2022  Future Appointments  Date Time Provider [place]  11/11/2021 11:00 AM [facility] CCM PHARMACIST [facility]  11/26/2021 11:40 AM Jaquita Folds, MD [facility]  12/15/2021  9:00 AM Allwardt, Randa Evens, PA-C [facility]  03/16/2022  8:00 AM [facility] HEALTH COACH [facility]    Star Rating Drugs: Losartan 100 mg last filled 10/08/2021 90 DS Lovastatin 20 mg last filled 10/06/2021 90 DS  April D Calhoun, [place] Pharmacist Assistant [phone]

## 2021-11-10 NOTE — Progress Notes (Signed)
Chronic Care Management Pharmacy Note  11/11/2021 Name:  Cheryl Wood MRN:  [id number] DOB:  1936/05/04  Summary: Initial visit with PharmD.  Started [place] after most recent A1c.  Reported one FBG to me of 122.  Tolerating medication well, upcoming visit in December for recheck.  LDL elevated July 2021 - on low intensity statin.  Recommendations/Changes made from today's visit: Recheck A1c - consider 58m daily of Farxiga if elevated Recheck Lipids - consider moderate intensity statin if still elevated Also due for diabetic foot exam  Plan: FU 4 months   Subjective: Cheryl SCHNAPPis an 85y.o. year old female who is a primary patient of Allwardt, [name] M, PA-C.  The CCM team was consulted for assistance with disease management and care coordination needs.    Engaged with patient by telephone for initial visit in response to provider referral for pharmacy case management and/or care coordination services.   Consent to Services:  The patient was given the following information about Chronic Care Management services today, agreed to services, and gave verbal consent: 1. CCM service includes personalized support from designated clinical staff supervised by the primary care provider, including individualized plan of care and coordination with other care providers 2. 24/7 contact phone numbers for assistance for urgent and routine care needs. 3. Service will only be billed when office clinical staff spend 20 minutes or more in a month to coordinate care. 4. Only one practitioner may furnish and bill the service in a calendar month. 5.The patient may stop CCM services at any time (effective at the end of the month) by phone call to the office staff. 6. The patient will be responsible for cost sharing (co-pay) of up to 20% of the service fee (after annual deductible is met). Patient agreed to services and consent obtained.  Patient Care Team: Allwardt, ARanda Evens PA-C as PCP - General  (Physician Assistant) DEdythe Clarity [facility]as Pharmacist (Pharmacist)  Recent office visits:  09/10/2021 [facility] (PCP) Allwardt, ARanda Evens PA-C; no medication changes indicated.   08/20/2021 [facility] (PCP) Allwardt, [name] M, PA-C; no medication changes indicated.   08/15/2021 [facility] (PCP) Allwardt, [name] M, PA-C;-Will start on Farxiga 5 mg at this time. SE discussed   07/02/2021 OHope Budds FNP; Tramadol as needed for very intense pain   Recent consult visits:  10/28/2021 [facility] (ophthalmology) SDala Dock MD;  Pred x1 OU Emycin ointment prn OD before bedtime Timolol x2 OD Brimonidine x2 OU  Recommend ATs prn Recommend Vaseline  Return in about 1 month (around 11/22/2021).   10/22/2021 [facility] (cardiology) BJeanann Lewandowsky MD; no medication changes indicated.   10/22/2021 [facility] (ophthalmology) Bond, JTracie Harrier MD;  Epi irreg resolved today OD.  Will decrease pred to x1 for a week then qod    10/13/2021 [facility] (pulmonology) Hunsucker, MBonna Gains MD; no medication changes indicated.   09/23/2021 [facility] (orthopedics) XLeandrew Koyanagi MD; no medication changes indicated.   09/11/2021 [facility] (pulmonology) Hunsucker, MBonna Gains MD; Use albuterol in the morning in the afternoons, spaced 6 hours apart, through the weekend.  Please report back on Monday if this has been helpful or not as this will be informative in our next steps.   08/20/2021 [facility] (urogyn) SJaquita Folds MD;  - Continue #0 incontinence ring pessary. She will keep the pessary in place until next visit.  - Continue estrogen cream twice a week. Can place at introitus/ in vagina as well as inner labia. - Continue  with Gemtesa 77m daily.   08/19/2021 [facility] (ophthalmology) SDala Dock MD;  - Discussed starting MTX and discussed r/b.  - D/c 10 mg weekly MTX with 1 mg folic acid daily on 96/2/95- not tolerating MTX due to nausea and sore throat  - D/c Azathioprine 25 mg daily (due to nausea)  - Prescribed Zofran for  nausea - Tapered off 5 mg oral Prednisone daily - I recommend starting Cellcept. I have discussed side effects and patient consents to proceed on 07/30/21.  - If she fails Cellcept, I will contemplate adding Acthar.  - Continue oral prednisone pulse (started on 07/30/21) - 40 mg daily for 2 weeks, 30 mg daily for 1 week, 20 mg daily for 1 week, 10 mg daily for 1 week and then d/c - Patient self d/c Cellcept 500 mg BID (started on 07/30/21) - start 500 mg daily today 08/19/21 - Currently on Remicade infusions per EMarella ChimesPA-C - Continue Famvir 500 mg daily (decreased from TID on 09/06/18)  - Currently on PF BID OD per Dr. BEdilia Bo  08/04/2021 [facility] (ophthalmology) BEdilia Bo JTracie Harrier MD;  Restart pred x2 OD only Start emycin ointment OD before bedtime Timolol x2 OD Brimonidine x2 OU  Recommend ATs prn Recommend Vaseline  Return in 9 weeks (on 10/06/2021).   07/30/2021 [facility] (ophthalmology) SDala Dock MD;  Timolol x2 OD Pred x3 OU - per rs Brimonidine x2 OU  Recommend ATs prn Recommend Vaseline    07/24/2021 [facility] (ophthalmology) Bond, JTracie Harrier MD;  Timolol x2 OD Pred x3 OU - per rs Brimonidine x2 OU  Recommend ATs prn Recommend Vaseline    07/15/2021 [facility] (orthopedics) XLeandrew Koyanagi MD; Rx prednisone dosepak as directed   06/10/2021 [facility] (dermatology) BRosendo Gros MD; no further information available   Hospital visits:  08/28/2021 ED Visit for Shortness of Breath   Objective:  Lab Results  Component Value Date   CREATININE 1.06 (H) 08/28/2021   BUN 24 (H) 08/28/2021   GFR 48.23 (L) 04/03/2021   GFRNONAA 52 (L) 08/28/2021   GFRAA 56 (L) 10/03/2020   NA 141 08/28/2021   K 3.6 08/28/2021   CALCIUM 9.1 08/28/2021   CO2 26 08/28/2021   GLUCOSE 209 (H) 08/28/2021    Lab Results  Component Value Date/Time   HGBA1C 8.1 (A) 08/15/2021 10:25 AM   HGBA1C 6.9 (H) 01/03/2021 08:44 AM   HGBA1C 7.3 (H) 07/03/2020 09:08 AM   HGBA1C 7.0 11/29/2019 12:00 AM   GFR 48.23 (L)  04/03/2021 01:37 PM   GFR 41.69 (L) 01/03/2021 08:44 AM   MICROALBUR <0.7 07/03/2020 09:08 AM   MICROALBUR 0.9 01/25/2019 11:49 AM    Last diabetic Eye exam: No results found for: HMDIABEYEEXA  Last diabetic Foot exam: No results found for: HMDIABFOOTEX   Lab Results  Component Value Date   CHOL 205 (H) 07/03/2020   HDL 49.50 07/03/2020   LDLCALC 132 (H) 07/03/2020   LDLDIRECT 136.0 01/25/2019   TRIG 118.0 07/03/2020   CHOLHDL 4 07/03/2020    Hepatic Function Latest Ref Rng & Units 03/28/2021 01/03/2021 10/03/2020  Total Protein 6.5 - 8.1 g/dL 7.1 7.3 6.9  Albumin 3.5 - 5.0 g/dL 4.3 4.6 -  AST 15 - 41 U/L _0 ALT 0 - 44 U/L _1 Alk Phosphatase 38 - 126 U/L 57 54 -  Total Bilirubin 0.3 - 1.2 mg/dL 1.5(H) 1.0 1.1    Lab Results  Component Value Date/Time  TSH 1.82 11/29/2019 12:00 AM   TSH 1.81 10/13/2019 08:38 AM   TSH 1.67 09/29/2019 09:21 AM    CBC Latest Ref Rng & Units 08/28/2021 03/28/2021 01/03/2021  WBC 4.0 - 10.5 K/uL 8.8 6.3 5.1  Hemoglobin 12.0 - 15.0 g/dL 13.3 13.7 13.4  Hematocrit 36.0 - 46.0 % 39.7 41.5 39.5  Platelets 150 - 400 K/uL 124(L) 162 188.0    No results found for: VD25OH  Clinical ASCVD: Wood  The ASCVD Risk score (Arnett DK, et al., 2019) failed to calculate for the following reasons:   The 2019 ASCVD risk score is only valid for ages 61 to 51   The patient has a prior MI or stroke diagnosis    Depression screen [facility] 2/9 07/02/2021 04/03/2021 03/10/2021  Decreased Interest 0 0 0  Down, Depressed, Hopeless 0 0 0  PHQ - 2 Score 0 0 0  Altered sleeping 0 - -  Tired, decreased energy 0 - -  Change in appetite 0 - -  Feeling bad or failure about yourself  0 - -  Trouble concentrating 0 - -  Moving slowly or fidgety/restless 0 - -  Suicidal thoughts 0 - -  PHQ-9 Score 0 - -  Difficult doing work/chores - - -     Social History   Tobacco Use  Smoking Status Former   Years: 40.00   Types: Cigarettes   Quit date: 11/05/1971   Years since  quitting: 50.0  Smokeless Tobacco Never   BP Readings from Last 3 Encounters:  11/03/21 121/74  10/22/21 (!) 98/58  10/13/21 122/88   Pulse Readings from Last 3 Encounters:  11/03/21 75  10/22/21 91  10/13/21 92   Wt Readings from Last 3 Encounters:  10/22/21 168 lb (76.2 kg)  10/13/21 169 lb (76.7 kg)  09/11/21 171 lb 3.2 oz (77.7 kg)   BMI Readings from Last 3 Encounters:  10/22/21 31.74 kg/m  10/13/21 31.93 kg/m  09/11/21 31.50 kg/m    Assessment/Interventions: Review of patient past medical history, allergies, medications, health status, including review of consultants reports, laboratory and other test data, was performed as part of comprehensive evaluation and provision of chronic care management services.   Cheryl:  (Social Determinants of Health) assessments and interventions performed: Wood  Financial Resource Strain: Low Risk    Difficulty of Paying Living Expenses: Not hard at all    Cheryl Screenings   Alcohol Screen: Not on file  Depression (PHQ2-9): Low Risk    PHQ-2 Score: 0  Financial Resource Strain: Low Risk    Difficulty of Paying Living Expenses: Not hard at all  Food Insecurity: No Food Insecurity   Worried About Charity fundraiser in the Last Year: Never true   Ran Out of Food in the Last Year: Never true  Housing: Low Risk    Last Housing Risk Score: 0  Physical Activity: Inactive   Days of Exercise per Week: 0 days   Minutes of Exercise per Session: 0 min  Social Connections: Moderately Isolated   Frequency of Communication with Friends and Family: More than three times a week   Frequency of Social Gatherings with Friends and Family: More than three times a week   Attends Religious Services: More than 4 times per year   Active Member of [employer] or Organizations: No   Attends Archivist Meetings: Never   Marital Status: Divorced  Stress: No Stress Concern Present   Feeling of Stress : Not at all  Tobacco  Use: Medium Risk   Smoking  Tobacco Use: Former   Smokeless Tobacco Use: Never   Passive Exposure: Not on file  Transportation Needs: No Transportation Needs   Lack of Transportation (Medical): No   Lack of Transportation (Non-Medical): No    CCM Care Plan  Allergies  Allergen Reactions   Dorzolamide Hcl-Timolol Mal Other (See Comments)    Red eyes   Tetanus Toxoids Swelling    Arm was twice the size it should be   Hctz [Hydrochlorothiazide] Rash    Medications Reviewed Today     Reviewed by Edythe Clarity, [facility] (Pharmacist) on 11/11/21 at 1147  Med List Status: <None>   Medication Order Taking? Sig Documenting Provider Last Dose Status Informant  acetaminophen (TYLENOL) 650 MG CR tablet [id number]  Take 650 mg by mouth every 8 (eight) hours as needed for pain. [provider]  Active Multiple Informants  b complex vitamins capsule [id number]  Take 1 capsule by mouth daily. [provider]  Active   brimonidine (ALPHAGAN) 0.2 % ophthalmic solution [id number] Wood Place 1 drop into both eyes 2 (two) times daily. [provider] Taking Active Multiple Informants  clopidogrel (PLAVIX) 75 MG tablet [id number] Wood TAKE ONE TABLET BY MOUTH DAILY  Patient taking differently: Take 75 mg by mouth daily.   Allwardt, Randa Evens, PA-C Taking Active   dapagliflozin propanediol (FARXIGA) 5 MG TABS tablet [id number] Wood TAKE ONE TABLET BY MOUTH DAILY BEFORE BREAKFAST Allwardt, [name] M, PA-C Taking Active   erythromycin ophthalmic ointment [id number]  Place into the right eye nightly. Per Dr. Edilia Bo, apply to right eye nightly. [provider]  Active   erythromycin ophthalmic ointment [id number]  SMARTSIG:In Eye(s) [provider]  Active   estradiol (ESTRACE) 0.1 MG/GM vaginal cream [id number]  Place 0.5 g vaginally 2 (two) times a week. Place 0.5g twice a week Jaquita Folds, MD  Active Multiple Informants  famciclovir [facility]) 500 MG tablet [id number]  Take 500 mg by mouth daily.   [provider]  Active Multiple Informants  famotidine (PEPCID) 20 MG tablet [id number]  TAKE ONE TABLET BY MOUTH TWICE A DAY  Patient taking differently: Take 20 mg by mouth 2 (two) times daily.   Leamon Arnt, MD  Active   furosemide (LASIX) 80 MG tablet [id number] Wood TAKE TWO TABLETS BY MOUTH DAILY Leamon Arnt, MD Taking Active   glucose blood (CONTOUR NEXT TEST) test strip [id number]  Use as instructed Allwardt, Randa Evens, PA-C  Active Multiple Informants  inFLIXimab (REMICADE) 100 MG injection [id number] Wood Inject 100 mg into the vein every 8 (eight) weeks. [provider] Taking Active Multiple Informants  KLOR-CON M20 20 MEQ tablet [id number]  TAKE ONE TABLET BY MOUTH DAILY Allwardt, Randa Evens, PA-C  Active   Lancets MISC [id number]  Check blood sugar once daily. Contour Next Device. Allwardt, Randa Evens, PA-C  Active Multiple Informants  loratadine (CLARITIN) 10 MG tablet [id number]  Take 10 mg by mouth daily. [provider]  Active Multiple Informants  losartan (COZAAR) 100 MG tablet [id number] Wood TAKE ONE TABLET BY MOUTH DAILY Allwardt, [name] M, PA-C Taking Active   lovastatin (MEVACOR) 20 MG tablet [id number] Wood TAKE ONE TABLET BY MOUTH EVERY EVENING  Patient taking differently: Take 20 mg by mouth at bedtime.   Orma Flaming, MD Taking Active   Multiple Vitamins-Minerals (CENTRUM SILVER 50+WOMEN PO) [id number]  Take 1 tablet by mouth daily. [provider]  Active Multiple Informants  mycophenolate (CELLCEPT)  500 MG tablet [id number]  Take 500 mg by mouth 2 (two) times daily. [provider]  Active   pramipexole (MIRAPEX) 0.25 MG tablet [id number]  TAKE ONE TABLET BY MOUTH EVERY NIGHT AT BEDTIME  Patient taking differently: Take 0.25 mg by mouth daily.   Allwardt, Randa Evens, PA-C  Active   prednisoLONE acetate (PRED FORTE) 1 % ophthalmic suspension [id number]  Place 1 drop into both eyes in the morning and at bedtime. [provider]   Active Multiple Informants           Med Note Dorina Hoyer, [facility]   Fri Mar 28, 2021 11:22 AM)    sertraline (ZOLOFT) 25 MG tablet [id number]  TAKE ONE TABLET BY MOUTH DAILY  Patient taking differently: Take 25 mg by mouth at bedtime.   Orma Flaming, MD  Active   timolol (BETIMOL) 0.5 % ophthalmic solution [id number]  Place 1 drop into the right eye 2 (two) times daily. [provider]  Active Multiple Informants  Vibegron 75 MG TABS [id number]  Take 1 tablet by mouth daily. 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   Nonrheumatic aortic valve insufficiency    CAD in native artery    Dyspnea on exertion 03/30/2018   TIA (transient ischemic attack) 10/22/2013   HTN (hypertension) 10/22/2013   Hyperlipidemia 10/22/2013    Immunization History  Administered Date(s) Administered   Fluad Quad(high Dose 65+) 09/29/2019, 10/03/2020   Influenza Split 08/30/2017   Influenza-Unspecified 09/28/2018   Moderna SARS-COV2 Booster Vaccination 01/31/2021   Moderna Sars-Covid-2 Vaccination 02/09/2020, 03/08/2020, 08/27/2020   Pneumococcal Conjugate-13 03/29/2015   Pneumococcal Polysaccharide-23 11/01/2019    Conditions to be addressed/monitored:  HTN, CAD, DM, HLD, RLS, Depression  Care Plan : General Pharmacy (Adult)  Updates made by Edythe Clarity, RPH since 11/11/2021 12:00 AM     Problem: HTN, CAD, DM, HLD, RLS, Depression   Priority:  High  Onset Date: 11/11/2021     Goal: Patient-Specific Goal   Note:   Current Barriers:  Unable to achieve control of glucose and lipids   Pharmacist Clinical Goal(s):  Patient will achieve control of glucose and lipids as evidenced by labs through collaboration with PharmD and provider.   Interventions: 1:1 collaboration with Allwardt, Randa Evens, PA-C regarding development and update of comprehensive plan of care as evidenced by provider attestation and co-signature Inter-disciplinary care team collaboration (see longitudinal plan of care) Comprehensive medication review performed; medication list updated in electronic medical record  Hypertension (BP goal <130/80) -Controlled -Current treatment: Losartan 167m daily -Medications previously tried: amlodipine  -Current home readings: 120s/70s -Current dietary habits: see DM -Current exercise habits: not much lately due to sciatica pain -Denies hypotensive/hypertensive symptoms -Educated on BP goals and benefits of medications for prevention of heart attack, stroke and kidney damage; Daily salt intake goal < 2300 mg; Exercise goal of 150 minutes per week; Importance of home blood pressure monitoring; -Counseled to monitor BP at home as current,  document, and provide log at future appointments -Reports adherence with BP med, does spike sometimes if she waits until later in the day to take her medication.  Always comes back down to normal. -Need to increase physical activity once pain improves -Recommended to continue current medication  Hyperlipidemia: (LDL goal < 70) -Not ideally controlled -Current treatment: Lovastatin 660m daily -Medications previously tried: none noted  -Current dietary patterns: see DM -Current exercise habits: minimal -Educated on Cholesterol goals;  Benefits of statin for ASCVD risk reduction; Importance of limiting foods high in cholesterol; -LDL was elevated at last check.  Currently on low  intensity statin. -Pt with DM consider at least moderate intensity. -Recommended to continue current medication Recheck lipid panel at [facility] in December, if remains elevated could consider increase to 469mor other mod intensity statin.  Diabetes (A1c goal <7%) -Uncontrolled -Current medications: Farxiga 60m460maily - started in August -Medications previously tried: none noted  -Current home glucose readings fasting glucose: 122, checks occasionally post prandial glucose: N/A -Denies hypoglycemic/hyperglycemic symptoms -Current meal patterns:  breakfast: fruit, cottage cheese, coffee  lunch: BLT sandwich  dinner: soups, smart ones meals snacks:  drinks: sprite zero, water -Current exercise: minimal due to pain -Educated on A1c and blood sugar goals; Benefits of routine self-monitoring of blood sugar; -Counseled to check feet daily and get yearly eye exams -Diet fairly low in sugars/ excess carbs -Recommended to continue current medication Reckeck A1c at upcoming visit, hopefully A1c improved.  If not could consider dose increase to 74m54mily.  Depression/Anxiety (Goal: Minimize symptoms) -Controlled -Current treatment: Sertraline 260mg41mly -Medications previously tried/failed: Lorazepam -PHQ9:  PHQ9 SCORE ONLY 07/02/2021 04/03/2021 03/10/2021  PHQ-9 Total Score 0 0 0    -Educated on Benefits of medication for symptom control -Recommended to continue current medication Reports sleep is good, no complaints of depressive symptoms.  Patient Goals/Self-Care Activities Patient will:  - take medications as prescribed as evidenced by patient report and record review check glucose a few times per week, document, and provide at future appointments target a minimum of 150 minutes of moderate intensity exercise weekly  Follow Up Plan: The care management team will reach out to the patient again over the next 120 days.        Medication Assistance: None required.  Patient affirms  current coverage meets needs.  Compliance/Adherence/Medication fill history: Care Gaps: Needs diabetic foot exam  Star-Rating Drugs: Lovastatin 10/06/21 90ds  [id number] [id number]: [phone], [place] - [place]: [phone] pill box? Wood Pt endorses 100% compliance  We discussed: Benefits of medication synchronization, packaging and delivery as well as enhanced pharmacist oversight with [employer]. Patient decided to: Continue current medication management strategy  Care Plan and Follow Up Patient Decision:  Patient agrees to Care Plan and Follow-up.  Plan: The care management team will reach out to the patient again over the next 120 days.  ChrisBeverly MilchrmD Clinical Pharmacist  [facility])[phone]

## 2021-11-11 ENCOUNTER — Ambulatory Visit (INDEPENDENT_AMBULATORY_CARE_PROVIDER_SITE_OTHER): Payer: Medicare HMO | Admitting: Pharmacist

## 2021-11-11 DIAGNOSIS — E782 Mixed hyperlipidemia: Secondary | ICD-10-CM

## 2021-11-11 DIAGNOSIS — I10 Essential (primary) hypertension: Secondary | ICD-10-CM

## 2021-11-11 DIAGNOSIS — E119 Type 2 diabetes mellitus without complications: Secondary | ICD-10-CM

## 2021-11-11 NOTE — Patient Instructions (Addendum)
Visit Information   Goals Addressed             This Visit's Progress    Set My Target A1C-Diabetes Type 2       Timeframe:  Long-Range Goal Priority:  High Start Date:   11/11/21                          Expected End Date: 05/11/22                     Follow Up Date 02/11/22    - set target A1C < 7    Why is this important?   Your target A1C is decided together by you and your doctor.  It is based on several things like your age and other health issues.    Notes:        Patient Care Plan: General Pharmacy (Adult)     Problem Identified: HTN, CAD, DM, HLD, RLS, Depression   Priority: High  Onset Date: 11/11/2021     Goal: Patient-Specific Goal   Note:   Current Barriers:  Unable to achieve control of glucose and lipids   Pharmacist Clinical Goal(s):  Patient will achieve control of glucose and lipids as evidenced by labs through collaboration with PharmD and provider.   Interventions: 1:1 collaboration with Allwardt, Randa Evens, PA-C regarding development and update of comprehensive plan of care as evidenced by provider attestation and co-signature Inter-disciplinary care team collaboration (see longitudinal plan of care) Comprehensive medication review performed; medication list updated in electronic medical record  Hypertension (BP goal <130/80) -Controlled -Current treatment: Losartan 100mg  daily -Medications previously tried: amlodipine  -Current home readings: 120s/70s -Current dietary habits: see DM -Current exercise habits: not much lately due to sciatica pain -Denies hypotensive/hypertensive symptoms -Educated on BP goals and benefits of medications for prevention of heart attack, stroke and kidney damage; Daily salt intake goal < 2300 mg; Exercise goal of 150 minutes per week; Importance of home blood pressure monitoring; -Counseled to monitor BP at home as current, document, and provide log at future appointments -Reports adherence with BP med,  does spike sometimes if she waits until later in the day to take her medication.  Always comes back down to normal. -Need to increase physical activity once pain improves -Recommended to continue current medication  Hyperlipidemia: (LDL goal < 70) -Not ideally controlled -Current treatment: Lovastatin 20mg  daily -Medications previously tried: none noted  -Current dietary patterns: see DM -Current exercise habits: minimal -Educated on Cholesterol goals;  Benefits of statin for ASCVD risk reduction; Importance of limiting foods high in cholesterol; -LDL was elevated at last check.  Currently on low intensity statin. -Pt with DM consider at least moderate intensity. -Recommended to continue current medication Recheck lipid panel at [facility] in December, if remains elevated could consider increase to 40mg  or other mod intensity statin.  Diabetes (A1c goal <7%) -Uncontrolled -Current medications: Farxiga 5mg  daily - started in August -Medications previously tried: none noted  -Current home glucose readings fasting glucose: 122, checks occasionally post prandial glucose: N/A -Denies hypoglycemic/hyperglycemic symptoms -Current meal patterns:  breakfast: fruit, cottage cheese, coffee  lunch: BLT sandwich  dinner: soups, smart ones meals snacks:  drinks: sprite zero, water -Current exercise: minimal due to pain -Educated on A1c and blood sugar goals; Benefits of routine self-monitoring of blood sugar; -Counseled to check feet daily and get yearly eye exams -Diet fairly low in sugars/ excess carbs -Recommended to continue  current medication Reckeck A1c at upcoming visit, hopefully A1c improved.  If not could consider dose increase to 10mg  daily.  Depression/Anxiety (Goal: Minimize symptoms) -Controlled -Current treatment: Sertraline 25mg  daily -Medications previously tried/failed: Lorazepam -PHQ9:  PHQ9 SCORE ONLY 07/02/2021 04/03/2021 03/10/2021  PHQ-9 Total Score 0 0 0    -Educated  on Benefits of medication for symptom control -Recommended to continue current medication Reports sleep is good, no complaints of depressive symptoms.  Patient Goals/Self-Care Activities Patient will:  - take medications as prescribed as evidenced by patient report and record review check glucose a few times per week, document, and provide at future appointments target a minimum of 150 minutes of moderate intensity exercise weekly  Follow Up Plan: The care management team will reach out to the patient again over the next 120 days.       Ms. Montejano was given information about Chronic Care Management services today including:  CCM service includes personalized support from designated clinical staff supervised by her physician, including individualized plan of care and coordination with other care providers 24/7 contact phone numbers for assistance for urgent and routine care needs. Standard insurance, coinsurance, copays and deductibles apply for chronic care management only during months in which we provide at least 20 minutes of these services. Most insurances cover these services at 100%, however patients may be responsible for any copay, coinsurance and/or deductible if applicable. This service may help you avoid the need for more expensive face-to-face services. Only one practitioner may furnish and bill the service in a calendar month. The patient may stop CCM services at any time (effective at the end of the month) by phone call to the office staff.  Patient agreed to services and verbal consent obtained.   The patient verbalized understanding of instructions, educational materials, and care plan provided today and agreed to receive a mailed copy of patient instructions, educational materials, and care plan.  Telephone follow up appointment with pharmacy team member scheduled for: 4 months  Edythe Clarity, [place]

## 2021-11-13 ENCOUNTER — Other Ambulatory Visit: Payer: Self-pay | Admitting: Physician Assistant

## 2021-11-16 ENCOUNTER — Other Ambulatory Visit: Payer: Self-pay | Admitting: Family Medicine

## 2021-11-24 ENCOUNTER — Telehealth: Payer: Self-pay | Admitting: Physical Medicine and Rehabilitation

## 2021-11-24 NOTE — Telephone Encounter (Signed)
Patient called. She would like another injection.

## 2021-11-26 ENCOUNTER — Encounter: Payer: Self-pay | Admitting: Obstetrics and Gynecology

## 2021-11-26 ENCOUNTER — Other Ambulatory Visit: Payer: Self-pay

## 2021-11-26 ENCOUNTER — Ambulatory Visit (INDEPENDENT_AMBULATORY_CARE_PROVIDER_SITE_OTHER): Payer: Medicare HMO | Admitting: Obstetrics and Gynecology

## 2021-11-26 VITALS — BP 118/73 | HR 65 | Wt 168.0 lb

## 2021-11-26 DIAGNOSIS — Z7984 Long term (current) use of oral hypoglycemic drugs: Secondary | ICD-10-CM

## 2021-11-26 DIAGNOSIS — I10 Essential (primary) hypertension: Secondary | ICD-10-CM | POA: Diagnosis not present

## 2021-11-26 DIAGNOSIS — E782 Mixed hyperlipidemia: Secondary | ICD-10-CM | POA: Diagnosis not present

## 2021-11-26 DIAGNOSIS — N393 Stress incontinence (female) (male): Secondary | ICD-10-CM

## 2021-11-26 DIAGNOSIS — N3281 Overactive bladder: Secondary | ICD-10-CM

## 2021-11-26 DIAGNOSIS — E119 Type 2 diabetes mellitus without complications: Secondary | ICD-10-CM | POA: Diagnosis not present

## 2021-11-26 DIAGNOSIS — M0600 Rheumatoid arthritis without rheumatoid factor, unspecified site: Secondary | ICD-10-CM | POA: Diagnosis not present

## 2021-11-26 NOTE — Progress Notes (Signed)
[facility]   Subjective:     Chief Complaint:  Chief Complaint  Patient presents with   Follow-up    Cheryl Wood is a 85 y.o. female here for a pessary check.      History of Present Illness: Cheryl Wood is a 85 y.o. female with stress incontinence and OAB who presents for a pessary check. She is using a size #0 incontinence ring pessary. The pessary has been working well and she has no complaints. Using the vaginal estrogen cream. Noticed a little blood with wiping once but has not seen it since.   Currently on Gemtesa 75mg  for overactive bladder. Not having any bladder leakage.   Past Medical History: Patient  has a past medical history of Allergy, Arthritis, Bleeding of blood vessel, Blood in stool, Chronic kidney disease, Depression, Glaucoma, Heart murmur, History of chicken pox, History of recurrent UTIs, History of stomach ulcers, Hyperlipidemia, Hypertension, Memory loss, Rheumatic fever, Seizures ([place]), Stroke ([place]), TIA (transient ischemic attack) (10/22/2013), and Urine incontinence.   Past Surgical History: She  has a past surgical history that includes Abdominal hysterectomy; gum transplant; Cataract extraction (Right, May 13, 2014); RIGHT/LEFT HEART CATH AND CORONARY ANGIOGRAPHY (N/A, 05/12/2018); THORACIC AORTOGRAM (N/A, 05/12/2018); Eye surgery (12/13/2018); Appendectomy (1983); and Tonsillectomy.   Medications: She has a current medication list which includes the following prescription(s): acetaminophen, b complex vitamins, brimonidine, clopidogrel, erythromycin, erythromycin, estradiol, famciclovir, famotidine, farxiga, furosemide, contour next test, remicade, klor-con m20, lancets, loratadine, losartan, lovastatin, multiple vitamins-minerals, mycophenolate, pramipexole, prednisolone acetate, sertraline, timolol, and vibegron.   Allergies: Patient is allergic to dorzolamide hcl-timolol mal, tetanus toxoids, and hctz [hydrochlorothiazide].   Social  History: Patient  reports that she quit smoking about 50 years ago. Her smoking use included cigarettes. She has never used smokeless tobacco. She reports that she does not drink alcohol and does not use drugs.      Objective:    Physical Exam: BP 118/73   Pulse 65   Wt 168 lb (76.2 kg)   LMP  (LMP Unknown)   BMI 31.74 kg/m  Gen: No apparent distress, A&O x 3.  Detailed Urogynecologic Evaluation:  Pelvic Exam: Normal external female genitalia; Bartholin's and Skene's glands normal in appearance; urethral meatus normal in appearance, no urethral masses or discharge. The pessary was noted to be in place. It was removed and cleaned. Speculum exam revealed small lesions at the apex of the vagina which had some light bleeding, treated with silver nitrate. The pessary was replaced. It was comfortable to the patient and fit well.   POP-Q (03/26/21):    POP-Q   -2                                            Aa   -2                                           Ba   -6                                              C    1.5  Gh   3                                            Pb   6.5                                            tvl    -1.5                                            Ap   -1.5                                            Bp                                                  D         Assessment/Plan:    Assessment: Cheryl Wood is a 85 y.o. with stress incontinence and OAB here for a pessary check.   Plan:  SUI - Continue #0 incontinence ring pessary. She will keep the pessary in place until next visit.  - Continue estrogen cream twice a week.   OAB - Continue with Gemtesa 75mg  daily.   Follow up 3 months.   Time spent: I spent 20 minutes dedicated to the care of this patient on the date of this encounter to include pre-visit review of records, face-to-face time with the patient and post visit documentation.

## 2021-12-01 DIAGNOSIS — H4042X2 Glaucoma secondary to eye inflammation, left eye, moderate stage: Secondary | ICD-10-CM | POA: Diagnosis not present

## 2021-12-01 DIAGNOSIS — H4041X1 Glaucoma secondary to eye inflammation, right eye, mild stage: Secondary | ICD-10-CM | POA: Diagnosis not present

## 2021-12-08 ENCOUNTER — Encounter: Payer: Self-pay | Admitting: Physician Assistant

## 2021-12-08 ENCOUNTER — Other Ambulatory Visit: Payer: Self-pay | Admitting: Physician Assistant

## 2021-12-08 ENCOUNTER — Other Ambulatory Visit: Payer: Self-pay

## 2021-12-08 ENCOUNTER — Ambulatory Visit (INDEPENDENT_AMBULATORY_CARE_PROVIDER_SITE_OTHER): Payer: Medicare HMO | Admitting: Physician Assistant

## 2021-12-08 VITALS — BP 132/74 | HR 64 | Temp 97.4°F | Ht 61.0 in | Wt 166.2 lb

## 2021-12-08 DIAGNOSIS — E1165 Type 2 diabetes mellitus with hyperglycemia: Secondary | ICD-10-CM | POA: Diagnosis not present

## 2021-12-08 DIAGNOSIS — R040 Epistaxis: Secondary | ICD-10-CM

## 2021-12-08 DIAGNOSIS — E782 Mixed hyperlipidemia: Secondary | ICD-10-CM | POA: Diagnosis not present

## 2021-12-08 DIAGNOSIS — R202 Paresthesia of skin: Secondary | ICD-10-CM | POA: Diagnosis not present

## 2021-12-08 DIAGNOSIS — N1831 Chronic kidney disease, stage 3a: Secondary | ICD-10-CM

## 2021-12-08 DIAGNOSIS — I872 Venous insufficiency (chronic) (peripheral): Secondary | ICD-10-CM | POA: Diagnosis not present

## 2021-12-08 LAB — CBC WITH DIFFERENTIAL/PLATELET
Basophils Absolute: 0 10*3/uL (ref 0.0–0.1)
Basophils Relative: 0.3 % (ref 0.0–3.0)
Eosinophils Absolute: 0.1 10*3/uL (ref 0.0–0.7)
Eosinophils Relative: 1.8 % (ref 0.0–5.0)
HCT: 43.2 % (ref 36.0–46.0)
Hemoglobin: 14.3 g/dL (ref 12.0–15.0)
Lymphocytes Relative: 41.4 % (ref 12.0–46.0)
Lymphs Abs: 2.4 10*3/uL (ref 0.7–4.0)
MCHC: 33.1 g/dL (ref 30.0–36.0)
MCV: 97.6 fl (ref 78.0–100.0)
Monocytes Absolute: 0.7 10*3/uL (ref 0.1–1.0)
Monocytes Relative: 11.9 % (ref 3.0–12.0)
Neutro Abs: 2.6 10*3/uL (ref 1.4–7.7)
Neutrophils Relative %: 44.6 % (ref 43.0–77.0)
Platelets: 172 10*3/uL (ref 150.0–400.0)
RBC: 4.43 Mil/uL (ref 3.87–5.11)
RDW: 13.1 % (ref 11.5–15.5)
WBC: 5.9 10*3/uL (ref 4.0–10.5)

## 2021-12-08 LAB — COMPREHENSIVE METABOLIC PANEL
ALT: 14 U/L (ref 0–35)
AST: 16 U/L (ref 0–37)
Albumin: 4.4 g/dL (ref 3.5–5.2)
Alkaline Phosphatase: 50 U/L (ref 39–117)
BUN: 20 mg/dL (ref 6–23)
CO2: 29 mEq/L (ref 19–32)
Calcium: 9.9 mg/dL (ref 8.4–10.5)
Chloride: 105 mEq/L (ref 96–112)
Creatinine, Ser: 1.03 mg/dL (ref 0.40–1.20)
GFR: 50.24 mL/min — ABNORMAL LOW (ref >60.00)
Glucose, Bld: 121 mg/dL — ABNORMAL HIGH (ref 70–99)
Potassium: 3.8 mEq/L (ref 3.5–5.1)
Sodium: 143 mEq/L (ref 135–145)
Total Bilirubin: 0.8 mg/dL (ref 0.2–1.2)
Total Protein: 6.9 g/dL (ref 6.0–8.3)

## 2021-12-08 LAB — LIPID PANEL
Cholesterol: 189 mg/dL (ref 0–200)
HDL: 52.5 mg/dL (ref >39.00)
LDL Cholesterol: 102 mg/dL — ABNORMAL HIGH (ref 0–99)
NonHDL: 136.21
Total CHOL/HDL Ratio: 4
Triglycerides: 169 mg/dL — ABNORMAL HIGH (ref 0.0–149.0)
VLDL: 33.8 mg/dL (ref 0.0–40.0)

## 2021-12-08 LAB — HEMOGLOBIN A1C: Hgb A1c MFr Bld: 7.3 % — ABNORMAL HIGH (ref 4.6–6.5)

## 2021-12-08 NOTE — Patient Instructions (Addendum)
Good to see you today! Please go to the lab for blood work and I will call with results.   Referral sent to vein and vascular for ongoing leg concerns.  If you have anymore nosebleeds, consider ENT. For any bleeds more than 20 minutes that will not stop with pressure, go straight to ED.

## 2021-12-08 NOTE — Progress Notes (Signed)
Subjective:    Patient ID: Cheryl Wood, female    DOB: 29-Apr-1936, 85 y.o.   MRN: [id number]  Chief Complaint  Patient presents with   Leg Pain    Burning right leg   Epistaxis    Leg Pain   Epistaxis   Patient is in today for recheck as well as to address two concerns. Please see A/P for details.   Past Medical History:  Diagnosis Date   Allergy    Arthritis    Bleeding of blood vessel    ext. 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; embolic   TIA (transient ischemic attack) 10/22/2013   Urine incontinence     Past Surgical History:  Procedure Laterality Date   ABDOMINAL HYSTERECTOMY     APPENDECTOMY  1983   CATARACT EXTRACTION Right May 13, 2014   EYE SURGERY  12/13/2018   stent inserted in left eye   gum transplant     RIGHT/LEFT HEART CATH AND CORONARY ANGIOGRAPHY N/A 05/12/2018   Procedure: RIGHT/LEFT HEART CATH AND CORONARY ANGIOGRAPHY;  Surgeon: Belva Crome, MD;  Location: [facility];  Service: Cardiovascular;  Laterality: N/A;   THORACIC AORTOGRAM N/A 05/12/2018   Procedure: THORACIC AORTOGRAM;  Surgeon: Belva Crome, MD;  Location: [facility];  Service: Cardiovascular;  Laterality: N/A;   TONSILLECTOMY      Family History  Problem Relation Age of Onset   Cancer Mother    Early death Mother    Heart disease Father    Arthritis Father    Early death Father    Hearing loss Father    Hypertension Father    Hyperlipidemia Father    Cancer Sister    Arthritis Sister    Arthritis Brother    Diabetes Brother    Heart attack Brother    Heart disease Brother    Hyperlipidemia Brother    Hypertension Brother    Diabetes Daughter    Heart disease Daughter     Hyperlipidemia Daughter    Hyperlipidemia Maternal Grandmother    Hypertension Maternal Grandmother    Stroke Maternal Grandmother    Alcohol abuse Maternal Grandfather    Early death Maternal Grandfather        tuberculosis   Arthritis Paternal Grandmother    Alcohol abuse Paternal Grandfather    Heart disease Paternal Grandfather    Hyperlipidemia Paternal Grandfather    Hypertension Paternal Grandfather    Stroke Paternal Grandfather    Alcohol abuse Sister    COPD Sister    Drug abuse Sister    Early death Sister    Heart disease Sister    Hypertension Sister    Hyperlipidemia Sister    Early death Brother    Early death Brother     Social History   Tobacco Use   Smoking status: Former    Years: 40.00    Types: Cigarettes    Quit date: 11/05/1971    Years since quitting: 50.1   Smokeless tobacco: Never  Vaping Use   Vaping Use: Never used  Substance Use Topics   Alcohol use: No    Alcohol/week: 0.0 standard drinks   Drug use: No     Allergies  Allergen Reactions   Dorzolamide Hcl-Timolol Mal Other (See Comments)    Red eyes   Tetanus Toxoids Swelling    Arm was twice the size it should be   Hctz [Hydrochlorothiazide] Rash    Review of Systems  HENT:  Positive for nosebleeds.   NEGATIVE UNLESS OTHERWISE INDICATED IN HPI      Objective:     BP 132/74   Pulse 64   Temp (!) 97.4 F (36.3 C)   Ht 5\' 1"  (1.549 m)   Wt 166 lb 4 oz (75.4 kg)   LMP  (LMP Unknown)   SpO2 97%   BMI 31.41 kg/m   Wt Readings from Last 3 Encounters:  12/08/21 166 lb 4 oz (75.4 kg)  11/26/21 168 lb (76.2 kg)  10/22/21 168 lb (76.2 kg)    BP Readings from Last 3 Encounters:  12/08/21 132/74  11/26/21 118/73  11/03/21 121/74     Physical Exam Vitals and nursing note reviewed.  Constitutional:      General: She is not in acute distress.    Appearance: Normal appearance. She is normal weight.  HENT:     Head: Normocephalic.     Right Ear: External ear normal.      Left Ear: External ear normal.     Nose: Nose normal.     Mouth/Throat:     Mouth: Mucous membranes are moist.  Eyes:     Extraocular Movements: Extraocular movements intact.     Conjunctiva/sclera: Conjunctivae normal.     Pupils: Pupils are equal, round, and reactive to light.  Cardiovascular:     Rate and Rhythm: Normal rate and regular rhythm.     Pulses: Normal pulses.     Heart sounds: No murmur heard.    Comments: Brawny discoloration lower extremities Pulmonary:     Effort: Pulmonary effort is normal.     Breath sounds: Normal breath sounds.  Abdominal:     General: Abdomen is flat. Bowel sounds are normal.     Palpations: Abdomen is soft.     Tenderness: There is no abdominal tenderness.  Musculoskeletal:        General: Normal range of motion.     Cervical back: Normal range of motion.     Right lower leg: No edema.     Left lower leg: No edema.  Skin:    General: Skin is warm.  Neurological:     General: No focal deficit present.     Mental Status: She is alert and oriented to person, place, and time.     Gait: Gait normal.  Psychiatric:        Mood and Affect: Mood normal.        Behavior: Behavior normal.       Assessment & Plan:   Problem List Items Addressed This Visit       Other   Hyperlipidemia   Relevant Orders   Comprehensive metabolic panel   Lipid panel   Other Visit Diagnoses     Type 2 diabetes mellitus with hyperglycemia, without long-term current use of insulin ([place])    -  Primary   Relevant Orders   CBC with Differential/Platelet   Comprehensive metabolic panel   Lipid panel   Hemoglobin A1c   Chronic venous insufficiency       Relevant Orders  CBC with Differential/Platelet   Ambulatory referral to Vascular Surgery   Right leg paresthesias       Relevant Orders   Ambulatory referral to Vascular Surgery   Chronic kidney disease, stage 3a (HCC)       Epistaxis          1. Type 2 diabetes mellitus with hyperglycemia,  without long-term current use of insulin (HCC) -Need to repeat Ha1c and CMP today -Farxiga 5 mg daily -Annual eye exam and foot exam  2. Mixed hyperlipidemia -Lipid panel today (she is not fasting) -Currently on Lovastatin 20 mg daily  3. Chronic venous insufficiency 4. Right leg paresthesias -She is worried about new right lower leg tingling sensation, could be related to her diabetes. She would like to see vein and vascular as she has ongoing concerns about her legs. No pain with walking. Pulses intact bilaterally.   5. Chronic kidney disease, stage 3a (HCC) -Recheck CMP -Avoid NSAIDs  6. Epistaxis -Isolated incident last week lasted 5-10 minutes and she was able to stop with pressure. -No further bleeding since then (episode happened after trip to the mountains). -Informed of red flags when to go to ED if recurrent.    Nelida Mandarino M Drew Lips, PA-C

## 2021-12-11 NOTE — Progress Notes (Signed)
Notified patient of lab results.Patient voices understanding.  

## 2021-12-12 ENCOUNTER — Other Ambulatory Visit: Payer: Self-pay

## 2021-12-12 ENCOUNTER — Telehealth: Payer: Self-pay | Admitting: Physician Assistant

## 2021-12-12 MED ORDER — FAMOTIDINE 20 MG PO TABS: 20.0000 mg | ORAL_TABLET | Freq: Two times a day (BID) | ORAL | 2 refills | Status: DC

## 2021-12-12 NOTE — Telephone Encounter (Signed)
Rx sent in

## 2021-12-12 NOTE — Telephone Encounter (Signed)
Patient called because she is out of refills for famotidine (PEPCID) 20 MG tablet  Patient will be going out of town on Tuesday so she will need refill by Monday.   Please send to  [facility] [id number] [place], [place] Phone:  [phone]  Fax:  [phone]      Please advise

## 2021-12-15 ENCOUNTER — Other Ambulatory Visit: Payer: Self-pay

## 2021-12-15 ENCOUNTER — Encounter: Payer: Self-pay | Admitting: Physical Medicine and Rehabilitation

## 2021-12-15 ENCOUNTER — Ambulatory Visit (INDEPENDENT_AMBULATORY_CARE_PROVIDER_SITE_OTHER): Payer: Medicare HMO | Admitting: Physical Medicine and Rehabilitation

## 2021-12-15 ENCOUNTER — Ambulatory Visit: Payer: Self-pay

## 2021-12-15 ENCOUNTER — Ambulatory Visit: Payer: Medicare HMO | Admitting: Physician Assistant

## 2021-12-15 VITALS — BP 111/72 | HR 69

## 2021-12-15 DIAGNOSIS — M48061 Spinal stenosis, lumbar region without neurogenic claudication: Secondary | ICD-10-CM | POA: Diagnosis not present

## 2021-12-15 DIAGNOSIS — M5416 Radiculopathy, lumbar region: Secondary | ICD-10-CM | POA: Diagnosis not present

## 2021-12-15 MED ORDER — METHYLPREDNISOLONE ACETATE 80 MG/ML IJ SUSP
80.0000 mg | Freq: Once | INTRAMUSCULAR | Status: AC
  Administered 2021-12-15: 10:00:00 80 mg

## 2021-12-15 NOTE — Progress Notes (Signed)
Pt state lower back pain that travels down her left leg. Pt state walking, standing and laying down makes the pain worse. Pt state she takes over the counter pain meds and uses heat to help ease her pain  Numeric Pain Rating Scale and Functional Assessment Average Pain 7   In the last MONTH (on 0-10 scale) has pain interfered with the following?  1. General activity like being  able to carry out your everyday physical activities such as walking, climbing stairs, carrying groceries, or moving a chair?  Rating(10)   +Driver, +BT, -Dye Allergies.

## 2021-12-15 NOTE — Progress Notes (Signed)
Cheryl Wood - 85 y.o. female MRN [id number]  Date of birth: 21-Nov-1936  Office Visit Note: Visit Date: 12/15/2021 PCP: Fredirick Lathe, PA-C Referred by: Allwardt, Randa Evens, PA-C  Subjective: Chief Complaint  Patient presents with   Lower Back - Pain   Left Leg - Pain   HPI:  Cheryl Wood is a 85 y.o. female who comes in today for planned repeat Left L3-4  Lumbar Transforaminal epidural steroid injection with fluoroscopic guidance.  The patient has failed conservative care including home exercise, medications, time and activity modification.  This injection will be diagnostic and hopefully therapeutic.  Please see requesting physician notes for further details and justification. Patient received more than 50% pain relief from prior injection.  Interlaminar epidural did not seem to help very much.  She does have degenerative scoliotic deformity at L3 along with foraminal narrowing on the left.  Diagnostically she did get relief with her repeat this x1.  Depending on relief at that point would regroup with physical therapist looking more at the pelvis and hip area.  Would consider pelvic MRI.  She is followed by rheumatology.  She has been in physical therapy recently.  Referring: Dr. Eduard Roux   ROS Otherwise per HPI.  Assessment & Plan: Visit Diagnoses:    ICD-10-CM   1. Lumbar radiculopathy  M54.16 XR C-ARM NO REPORT    Epidural Steroid injection    methylPREDNISolone acetate (DEPO-MEDROL) injection 80 mg    2. Foraminal stenosis of lumbar region  M48.061 XR C-ARM NO REPORT    Epidural Steroid injection    methylPREDNISolone acetate (DEPO-MEDROL) injection 80 mg      Plan: No additional findings.   Meds & Orders:  Meds ordered this encounter  Medications   methylPREDNISolone acetate (DEPO-MEDROL) injection 80 mg    Orders Placed This Encounter  Procedures   XR C-ARM NO REPORT   Epidural Steroid injection    Follow-up: Return if symptoms worsen or fail to improve.    Procedures: No procedures performed  Lumbosacral Transforaminal Epidural Steroid Injection - Sub-Pedicular Approach with Fluoroscopic Guidance  Patient: Cheryl Wood      Date of Birth: [birth year] MRN: [id number] PCP: Fredirick Lathe, PA-C      Visit Date: 12/15/2021   Universal Protocol:    Date/Time: 12/15/2021  Consent Given By: the patient  Position: PRONE  Additional Comments: Vital signs were monitored before and after the procedure. Patient was prepped and draped in the usual sterile fashion. The correct patient, procedure, and site was verified.   Injection Procedure Details:   Procedure diagnoses: Lumbar radiculopathy [M54.16]    Meds Administered:  Meds ordered this encounter  Medications   methylPREDNISolone acetate (DEPO-MEDROL) injection 80 mg    Laterality: Left  Location/Site: L4  Needle:5.0 in., 22 ga.  Short bevel or Quincke spinal needle  Needle Placement: Transforaminal  Findings:    -Comments: Excellent flow of contrast along the nerve, nerve root and into the epidural space.  Procedure Details: After squaring off the end-plates to get a true AP view, the C-arm was positioned so that an oblique view of the foramen as noted above was visualized. The target area is just inferior to the "nose of the scotty dog" or sub pedicular. The soft tissues overlying this structure were infiltrated with 2-3 ml. of 1% Lidocaine without Epinephrine.  The spinal needle was inserted toward the target using a "trajectory" view along the fluoroscope beam.  Under AP and lateral visualization,  the needle was advanced so it did not puncture dura and was located close the 6 O'Clock position of the pedical in AP tracterory. Biplanar projections were used to confirm position. Aspiration was confirmed to be negative for CSF and/or blood. A 1-2 ml. volume of Isovue-250 was injected and flow of contrast was noted at each level. Radiographs were obtained for documentation  purposes.   After attaining the desired flow of contrast documented above, a 0.5 to 1.0 ml test dose of 0.25% Marcaine was injected into each respective transforaminal space.  The patient was observed for 90 seconds post injection.  After no sensory deficits were reported, and normal lower extremity motor function was noted,   the above injectate was administered so that equal amounts of the injectate were placed at each foramen (level) into the transforaminal epidural space.   Additional Comments:  No complications occurred Dressing: 2 x 2 sterile gauze and Band-Aid    Post-procedure details: Patient was observed during the procedure. Post-procedure instructions were reviewed.  Patient left the clinic in stable condition.    Clinical History: MRI lumbar spine:   TECHNIQUE: Sagittal and axial T1 and T2-weighted sequences were performed. Additional sagittal STIR images were performed.   INDICATION: Back pain   COMPARISON: None available   FINDINGS:  #  Marked Levoscoliosis apex at L1-2.  #  Vertebral body heights are well maintained.  #  There is type II Modic and plate change seen throughout the lumbar spine..  #  Conus terminates at L1 without evidence of tethering.  #  Nerve roots appear normal.  #  Incidental findings: None.    #  L1-2: Mild degenerative disc disease. Disc bulge without significant central canal stenosis or neuroforaminal narrowing.  #    #  L2-3: Mild degenerative disc disease. Central canal is well-maintained. Neural foramina are patent.  #    #  L3-4: Mild degenerative disc disease. There is left foraminal disc osteophyte causing moderate neural foraminal narrowing. There is mild central canal stenosis.  #    #  L4-5: Moderate degenerative disc disease. There is left-sided facet arthropathy and foraminal spurring causing mild neural foraminal narrowing. No significant central canal stenosis.  #    #  L5-S1: Mild degenerative disc disease. There is  facet arthropathy and disc uncovering causing mild left neural foraminal narrowing. No significant central canal stenosis.    IMPRESSION:   1.  Lumbar spondylosis most significant at L3-4 with left foraminal disc osteophyte causing moderate neural foraminal narrowing and mild central canal stenosis reflecting scoliotic curvature.   2.   There is mild left neural foraminal narrowing seen at L4-5 and L5-S1   3.  Marked Levoscoliosis apex at L1-2.   Electronically Signed by: Ritta Slot  Date: 11/14/18     Objective:  VS:  HT:     WT:    BMI:      BP:111/72   HR:69bpm   TEMP: ( )   RESP:  Physical Exam Vitals and nursing note reviewed.  Constitutional:      General: She is not in acute distress.    Appearance: Normal appearance. She is not ill-appearing.  HENT:     Head: Normocephalic and atraumatic.     Right Ear: External ear normal.     Left Ear: External ear normal.  Eyes:     Extraocular Movements: Extraocular movements intact.  Cardiovascular:     Rate and Rhythm: Normal rate.     Pulses: Normal pulses.  Pulmonary:     Effort: Pulmonary effort is normal. No respiratory distress.  Abdominal:     General: There is no distension.     Palpations: Abdomen is soft.  Musculoskeletal:        General: Tenderness present.     Cervical back: Neck supple.     Right lower leg: No edema.     Left lower leg: No edema.     Comments: Patient has good distal strength with no pain over the greater trochanters.  No clonus or focal weakness.  Skin:    Findings: No erythema, lesion or rash.  Neurological:     General: No focal deficit present.     Mental Status: She is alert and oriented to person, place, and time.     Sensory: No sensory deficit.     Motor: No weakness or abnormal muscle tone.     Coordination: Coordination normal.  Psychiatric:        Mood and Affect: Mood normal.        Behavior: Behavior normal.     Imaging: No results found.

## 2021-12-15 NOTE — Procedures (Signed)
Lumbosacral Transforaminal Epidural Steroid Injection - Sub-Pedicular Approach with Fluoroscopic Guidance  Patient: Cheryl Wood      Date of Birth: March 13, 1936 MRN: [id number] PCP: Fredirick Lathe, PA-C      Visit Date: 12/15/2021   Universal Protocol:    Date/Time: 12/15/2021  Consent Given By: the patient  Position: PRONE  Additional Comments: Vital signs were monitored before and after the procedure. Patient was prepped and draped in the usual sterile fashion. The correct patient, procedure, and site was verified.   Injection Procedure Details:   Procedure diagnoses: Lumbar radiculopathy [M54.16]    Meds Administered:  Meds ordered this encounter  Medications   methylPREDNISolone acetate (DEPO-MEDROL) injection 80 mg    Laterality: Left  Location/Site: L4  Needle:5.0 in., 22 ga.  Short bevel or Quincke spinal needle  Needle Placement: Transforaminal  Findings:    -Comments: Excellent flow of contrast along the nerve, nerve root and into the epidural space.  Procedure Details: After squaring off the end-plates to get a true AP view, the C-arm was positioned so that an oblique view of the foramen as noted above was visualized. The target area is just inferior to the "nose of the scotty dog" or sub pedicular. The soft tissues overlying this structure were infiltrated with 2-3 ml. of 1% Lidocaine without Epinephrine.  The spinal needle was inserted toward the target using a "trajectory" view along the fluoroscope beam.  Under AP and lateral visualization, the needle was advanced so it did not puncture dura and was located close the 6 O'Clock position of the pedical in AP tracterory. Biplanar projections were used to confirm position. Aspiration was confirmed to be negative for CSF and/or blood. A 1-2 ml. volume of Isovue-250 was injected and flow of contrast was noted at each level. Radiographs were obtained for documentation purposes.   After attaining the desired  flow of contrast documented above, a 0.5 to 1.0 ml test dose of 0.25% Marcaine was injected into each respective transforaminal space.  The patient was observed for 90 seconds post injection.  After no sensory deficits were reported, and normal lower extremity motor function was noted,   the above injectate was administered so that equal amounts of the injectate were placed at each foramen (level) into the transforaminal epidural space.   Additional Comments:  No complications occurred Dressing: 2 x 2 sterile gauze and Band-Aid    Post-procedure details: Patient was observed during the procedure. Post-procedure instructions were reviewed.  Patient left the clinic in stable condition.

## 2021-12-15 NOTE — Patient Instructions (Signed)

## 2021-12-25 ENCOUNTER — Other Ambulatory Visit: Payer: Self-pay | Admitting: *Deleted

## 2021-12-25 DIAGNOSIS — M79606 Pain in leg, unspecified: Secondary | ICD-10-CM

## 2021-12-31 ENCOUNTER — Ambulatory Visit (INDEPENDENT_AMBULATORY_CARE_PROVIDER_SITE_OTHER): Payer: Medicare HMO | Admitting: Physician Assistant

## 2021-12-31 ENCOUNTER — Other Ambulatory Visit: Payer: Self-pay | Admitting: Family Medicine

## 2021-12-31 VITALS — BP 143/82 | HR 62 | Temp 97.0°F | Ht 61.0 in | Wt 163.2 lb

## 2021-12-31 DIAGNOSIS — R0989 Other specified symptoms and signs involving the circulatory and respiratory systems: Secondary | ICD-10-CM

## 2021-12-31 LAB — POCT INFLUENZA A/B
Influenza A, POC: NEGATIVE
Influenza B, POC: NEGATIVE

## 2021-12-31 LAB — POC COVID19 BINAXNOW: SARS Coronavirus 2 Ag: NEGATIVE

## 2021-12-31 MED ORDER — AMOXICILLIN-POT CLAVULANATE 875-125 MG PO TABS: 1.0000 | ORAL_TABLET | Freq: Two times a day (BID) | ORAL | 0 refills | Status: AC

## 2021-12-31 NOTE — Progress Notes (Signed)
Subjective:    Patient ID: Cheryl Wood, female    DOB: 04-18-1936, 86 y.o.   MRN: [id number]  Chief Complaint  Patient presents with   URI    HPI  86 yo female presents in person today with the following:   Chief complaint: URI symptoms Symptom onset: 6 days ago, started with a dry cough, worsened from there  Pertinent positives: Productive cough of green/ yellow phlegm, watery eyes, fatigue, some chest tightness, ST Pertinent negatives: Fever, chills, CP, SOB, headache Treatments tried: Robitussin cough syrup Vaccine status: UTD on flu and COVID vaccines  Sick exposure: Sister with same symptoms - treated for sinusitis and "bad cold"   Past Medical History:  Diagnosis Date   Allergy    Arthritis    Bleeding of blood vessel    ext. 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; embolic   TIA (transient ischemic attack) 10/22/2013   Urine incontinence     Past Surgical History:  Procedure Laterality Date   ABDOMINAL HYSTERECTOMY     APPENDECTOMY  1983   CATARACT EXTRACTION Right May 13, 2014   EYE SURGERY  12/13/2018   stent inserted in left eye   gum transplant     RIGHT/LEFT HEART CATH AND CORONARY ANGIOGRAPHY N/A 05/12/2018   Procedure: RIGHT/LEFT HEART CATH AND CORONARY ANGIOGRAPHY;  Surgeon: Belva Crome, MD;  Location: [facility];  Service: Cardiovascular;  Laterality: N/A;   THORACIC AORTOGRAM N/A 05/12/2018   Procedure: THORACIC AORTOGRAM;  Surgeon: Belva Crome, MD;  Location: [facility];  Service: Cardiovascular;  Laterality: N/A;   TONSILLECTOMY      Family History  Problem Relation Age of Onset   Cancer Mother    Early death Mother    Heart disease Father    Arthritis Father     Early death Father    Hearing loss Father    Hypertension Father    Hyperlipidemia Father    Cancer Sister    Arthritis Sister    Arthritis Brother    Diabetes Brother    Heart attack Brother    Heart disease Brother    Hyperlipidemia Brother    Hypertension Brother    Diabetes Daughter    Heart disease Daughter    Hyperlipidemia Daughter    Hyperlipidemia Maternal Grandmother    Hypertension Maternal Grandmother    Stroke Maternal Grandmother    Alcohol abuse Maternal Grandfather    Early death Maternal Grandfather        tuberculosis   Arthritis Paternal Grandmother    Alcohol abuse Paternal Grandfather    Heart disease Paternal Grandfather    Hyperlipidemia Paternal Grandfather    Hypertension Paternal Grandfather    Stroke Paternal Grandfather    Alcohol abuse Sister    COPD Sister    Drug abuse Sister    Early death Sister    Heart disease Sister    Hypertension Sister    Hyperlipidemia Sister    Early death Brother    Early death Brother     Social History  Tobacco Use   Smoking status: Former    Years: 40.00    Types: Cigarettes    Quit date: 11/05/1971    Years since quitting: 50.1   Smokeless tobacco: Never  Vaping Use   Vaping Use: Never used  Substance Use Topics   Alcohol use: No    Alcohol/week: 0.0 standard drinks   Drug use: No     Allergies  Allergen Reactions   Dorzolamide Hcl-Timolol Mal Other (See Comments)    Red eyes   Tetanus Toxoids Swelling    Arm was twice the size it should be   Hctz [Hydrochlorothiazide] Rash    Review of Systems NEGATIVE UNLESS OTHERWISE INDICATED IN HPI      Objective:     BP (!) 143/82    Pulse 62    Temp (!) 97 F (36.1 C)    Ht 5\' 1"  (1.549 m)    Wt 163 lb 3.2 oz (74 kg)    LMP  (LMP Unknown)    SpO2 97%    BMI 30.84 kg/m   Wt Readings from Last 3 Encounters:  12/31/21 163 lb 3.2 oz (74 kg)  12/08/21 166 lb 4 oz (75.4 kg)  11/26/21 168 lb (76.2 kg)    BP Readings from Last 3  Encounters:  12/31/21 (!) 143/82  12/15/21 111/72  12/08/21 132/74     Physical Exam Vitals and nursing note reviewed.  Constitutional:      General: She is not in acute distress.    Appearance: Normal appearance. She is normal weight.  HENT:     Head: Normocephalic.     Right Ear: External ear normal.     Left Ear: External ear normal.     Nose: Nose normal.     Mouth/Throat:     Mouth: Mucous membranes are moist.  Eyes:     Extraocular Movements: Extraocular movements intact.     Conjunctiva/sclera: Conjunctivae normal.     Pupils: Pupils are equal, round, and reactive to light.  Cardiovascular:     Rate and Rhythm: Normal rate and regular rhythm.     Pulses: Normal pulses.     Heart sounds: No murmur heard.    Comments: Brawny discoloration lower extremities Pulmonary:     Effort: Pulmonary effort is normal.     Breath sounds: Normal breath sounds. No wheezing, rhonchi or rales.  Chest:     Chest wall: No tenderness.  Abdominal:     General: Abdomen is flat. Bowel sounds are normal.     Palpations: Abdomen is soft.     Tenderness: There is no abdominal tenderness.  Musculoskeletal:        General: Normal range of motion.     Cervical back: Normal range of motion.     Right lower leg: No edema.     Left lower leg: No edema.  Skin:    General: Skin is warm.  Neurological:     General: No focal deficit present.     Mental Status: She is alert and oriented to person, place, and time.     Gait: Gait normal.  Psychiatric:        Mood and Affect: Mood normal.        Behavior: Behavior normal.       Assessment & Plan:   Problem List Items Addressed This Visit   None Visit Diagnoses     Chest congestion    -  Primary   Relevant Orders   POCT Influenza  A/B (Completed)   POC COVID-19 (Completed)        Meds ordered this encounter  Medications   amoxicillin-clavulanate (AUGMENTIN) 875-125 MG tablet    Sig: Take 1 tablet by mouth 2 (two) times daily for 7  days.    Dispense:  14 tablet    Refill:  0    1. Chest congestion Persistent symptoms almost one week out despite conservative efforts at home. POC flu and COVID-19 tests negative in office. Will Rx Augmentin at this time, take with food. Cautioned on antibiotic use and possible side effects. Advised nasal saline, humidifier, Mucinex, Robitussin, and pushing fluids. Call if worse or no improvement.     Nakai Yard M Niquita Digioia, PA-C

## 2021-12-31 NOTE — Patient Instructions (Addendum)
Flu and COVID tests negative in our office.   Please take the Augmentin as directed. Use a humidifier, nasal saline, fluids. Plain Mucinex and Robitussin to help with cough.  Let me know how you are doing!

## 2022-01-01 ENCOUNTER — Other Ambulatory Visit: Payer: Self-pay

## 2022-01-07 ENCOUNTER — Other Ambulatory Visit: Payer: Self-pay | Admitting: Physician Assistant

## 2022-01-08 NOTE — Progress Notes (Signed)
Office Note     CC: Venous insufficiency Requesting Provider:  Allwardt, Randa Evens, PA-C  HPI: Cheryl Wood is a 86 y.o. (Sep 02, 1936) female who presents at the request of Allwardt, Cheryl M, PA-C for evaluation of bilateral lower extremity venous insufficiency.  On exam today, [name] was doing well.  She noted a significant family history describing family members with wounds on the legs weeping at the time of their passing.  Her biggest fear is to have chronic ulcerations that will not heal.  [name] is appreciated varicosities in bilateral lower extremities, but most significant in the right leg.  While not painful to touch, she does note significant leg heaviness by the end of the day.  This is accompanied with tired throbbing and aching sensations.  Swelling is also appreciated.  She denies bleeding or ulceration but does note skin discoloration in the calves. She received compressions from the hospital years ago which helped.  Patient had previous left-sided saphenous vein stripping, no pain medication.  No previous DVT  [name] denied symptoms of claudication, ischemic rest pain, tissue loss.  The pt  on a statin for cholesterol management.  The pt not on a daily aspirin.   Other AC:  plavix The pt is on medications for hypertension.   The pt is diabetic.   Tobacco hx:  none  Past Medical History:  Diagnosis Date   Allergy    Arthritis    Bleeding of blood vessel    ext. 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; embolic   TIA (transient ischemic attack) 10/22/2013   Urine incontinence     Past Surgical History:  Procedure Laterality Date   ABDOMINAL HYSTERECTOMY     APPENDECTOMY  1983   CATARACT EXTRACTION Right  May 13, 2014   EYE SURGERY  12/13/2018   stent inserted in left eye   gum transplant     RIGHT/LEFT HEART CATH AND CORONARY ANGIOGRAPHY N/A 05/12/2018   Procedure: RIGHT/LEFT HEART CATH AND CORONARY ANGIOGRAPHY;  Surgeon: Belva Crome, MD;  Location: [facility];  Service: Cardiovascular;  Laterality: N/A;   THORACIC AORTOGRAM N/A 05/12/2018   Procedure: THORACIC AORTOGRAM;  Surgeon: Belva Crome, MD;  Location: [facility];  Service: Cardiovascular;  Laterality: N/A;   TONSILLECTOMY      Social History   Socioeconomic History   Marital status: Divorced    Spouse name: Not on file   Number of children: 2   Years of education: college   Highest education level: Not on file  Occupational History    Employer: RETIRED  Tobacco Use   Smoking status: Former    Years: 40.00    Types: Cigarettes    Quit date: 11/05/1971    Years since quitting: 50.2   Smokeless tobacco: Never  Vaping Use   Vaping Use: Never used  Substance and Sexual Activity   Alcohol use: No    Alcohol/week: 0.0 standard drinks   Drug use: No   Sexual activity: Not Currently  Other Topics Concern   Not on file  Social History Narrative   Patient lives at home alone   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   Worked at [employer] for 18 years   Daughters in [place] and in [place]   Caffeine Use: 2-3 cups daily   Social Determinants of Health   Financial Resource Strain: Low Risk    Difficulty of Paying Living Expenses: Not hard at all  Food Insecurity: No Food Insecurity   Worried About Charity fundraiser in the Last Year: Never true   Arboriculturist in the Last Year: Never true  Transportation Needs: No Transportation Needs   Lack of Transportation (Medical): No   Lack of Transportation (Non-Medical): No  Physical Activity: Inactive   Days of Exercise per Week: 0 days   Minutes of Exercise per Session: 0 min  Stress: No Stress Concern Present    Feeling of Stress : Not at all  Social Connections: Moderately Isolated   Frequency of Communication with Friends and Family: More than three times a week   Frequency of Social Gatherings with Friends and Family: More than three times a week   Attends Religious Services: More than 4 times per year   Active Member of [employer] or Organizations: No   Attends Music therapist: Never   Marital Status: Divorced  Human resources officer Violence: Not At Risk   Fear of Current or Ex-Partner: No   Emotionally Abused: No   Physically Abused: No   Sexually Abused: No    Family History  Problem Relation Age of Onset   Cancer Mother    Early death Mother    Heart disease Father    Arthritis Father    Early death Father    Hearing loss Father    Hypertension Father    Hyperlipidemia Father    Cancer Sister    Arthritis Sister    Arthritis Brother    Diabetes Brother    Heart attack Brother    Heart disease Brother    Hyperlipidemia Brother    Hypertension Brother    Diabetes Daughter    Heart disease Daughter    Hyperlipidemia Daughter    Hyperlipidemia Maternal Grandmother    Hypertension Maternal Grandmother    Stroke Maternal Grandmother    Alcohol abuse Maternal Grandfather    Early death Maternal Grandfather        tuberculosis   Arthritis Paternal Grandmother    Alcohol abuse Paternal Grandfather    Heart disease Paternal Grandfather    Hyperlipidemia Paternal Grandfather    Hypertension Paternal Grandfather    Stroke Paternal Grandfather    Alcohol abuse Sister    COPD Sister    Drug abuse Sister    Early death Sister    Heart disease Sister    Hypertension Sister    Hyperlipidemia Sister    Early death Brother    Early death Brother     Current Outpatient Medications  Medication Sig Dispense Refill   acetaminophen (TYLENOL) 650 MG CR tablet Take 650 mg by mouth every 8 (eight) hours as needed for pain.     b complex vitamins capsule Take 1 capsule by mouth  daily.     brimonidine (ALPHAGAN) 0.2 % ophthalmic solution Place 1 drop into both eyes 2 (two) times daily.     clopidogrel (PLAVIX) 75 MG tablet TAKE ONE TABLET BY MOUTH DAILY (Patient taking differently: Take 75 mg by mouth daily.) 90 tablet 3  erythromycin ophthalmic ointment Place into the right eye nightly. Per Dr. Edilia Bo, apply to right eye nightly.     erythromycin ophthalmic ointment SMARTSIG:In Eye(s)     estradiol (ESTRACE) 0.1 MG/GM vaginal cream Place 0.5 g vaginally 2 (two) times a week. Place 0.5g twice a week 30 g 11   famciclovir (FAMVIR) 500 MG tablet Take 500 mg by mouth daily.      famotidine (PEPCID) 20 MG tablet Take 1 tablet (20 mg total) by mouth 2 (two) times daily. 180 tablet 2   FARXIGA 5 MG TABS tablet TAKE ONE TABLET BY MOUTH DAILY BEFORE BREAKFAST 30 tablet 0   furosemide (LASIX) 80 MG tablet TAKE TWO TABLETS BY MOUTH DAILY 180 tablet 2   glucose blood (CONTOUR NEXT TEST) test strip Use as instructed 100 each 12   inFLIXimab (REMICADE) 100 MG injection Inject 100 mg into the vein every 8 (eight) weeks.     KLOR-CON M20 20 MEQ tablet TAKE ONE TABLET BY MOUTH DAILY 90 tablet 0   Lancets MISC Check blood sugar once daily. Contour Next Device. 100 each 0   loratadine (CLARITIN) 10 MG tablet Take 10 mg by mouth daily.     losartan (COZAAR) 100 MG tablet TAKE ONE TABLET BY MOUTH DAILY 90 tablet 1   lovastatin (MEVACOR) 20 MG tablet TAKE ONE TABLET BY MOUTH EVERY EVENING 90 tablet 3   Multiple Vitamins-Minerals (CENTRUM SILVER 50+WOMEN PO) Take 1 tablet by mouth daily.     mycophenolate (CELLCEPT) 500 MG tablet Take 500 mg by mouth 2 (two) times daily.     pramipexole (MIRAPEX) 0.25 MG tablet TAKE ONE TABLET BY MOUTH EVERY NIGHT AT BEDTIME (Patient taking differently: Take 0.25 mg by mouth daily.) 90 tablet 1   prednisoLONE acetate (PRED FORTE) 1 % ophthalmic suspension Place 1 drop into both eyes in the morning and at bedtime.     sertraline (ZOLOFT) 25 MG tablet TAKE ONE  TABLET BY MOUTH DAILY 90 tablet 3   timolol (BETIMOL) 0.5 % ophthalmic solution Place 1 drop into the right eye 2 (two) times daily.     Vibegron 75 MG TABS Take 1 tablet by mouth daily. 30 tablet 11   No current facility-administered medications for this visit.    Allergies  Allergen Reactions   Dorzolamide Hcl-Timolol Mal Other (See Comments)    Red eyes   Tetanus Toxoids Swelling    Arm was twice the size it should be   Hctz [Hydrochlorothiazide] Rash     REVIEW OF SYSTEMS:   [X]  denotes positive finding, [ ]  denotes negative finding Cardiac  Comments:  Chest pain or chest pressure:    Shortness of breath upon exertion:    Short of breath when lying flat:    Irregular heart rhythm:        Vascular    Pain in calf, thigh, or hip brought on by ambulation:    Pain in feet at night that wakes you up from your sleep:     Blood clot in your veins:    Leg swelling:         Pulmonary    Oxygen at home:    Productive cough:     Wheezing:         Neurologic    Sudden weakness in arms or legs:     Sudden numbness in arms or legs:     Sudden onset of difficulty speaking or slurred speech:    Temporary loss of vision in  one eye:     Problems with dizziness:         Gastrointestinal    Blood in stool:     Vomited blood:         Genitourinary    Burning when urinating:     Blood in urine:        Psychiatric    Major depression:         Hematologic    Bleeding problems:    Problems with blood clotting too easily:        Skin    Rashes or ulcers:        Constitutional    Fever or chills:      PHYSICAL EXAMINATION:  There were no vitals filed for this visit.  General:  WDWN in NAD; vital signs documented above Gait: Not observed HENT: WNL, normocephalic Pulmonary: normal non-labored breathing , without Rales, rhonchi,  wheezing Cardiac: regular HR,  Skin: without rashes Vascular Exam/Pulses:  Right Left  Radial 2+ (normal) 2+ (normal)  Ulnar 2+ (normal)  2+ (normal)  Femoral 2+ (normal) 2+ (normal)  Popliteal    DP 2+ (normal) 2+ (normal)  PT 1+ (weak) 1+ (weak)   Extremities: without ischemic changes, without Gangrene , without cellulitis; without open wounds; patient with hemosiderin deposits in bilateral lower extremities, lipodermatosclerosis. Musculoskeletal: no muscle wasting or atrophy  Neurologic: A&O X 3;  No focal weakness or paresthesias are detected Psychiatric:  The pt has Normal affect.   Non-Invasive Vascular Imaging:   Venous Reflux Times  +-------------+---------+------+---------+------------+--------------------  ---+   RIGHT         Reflux No Reflux  Reflux   Diameter cms Comments                                              Yes     Time                                             +-------------+---------+------+---------+------------+--------------------  ---+   CFV           no                                                                  +-------------+---------+------+---------+------------+--------------------  ---+   FV prox       no                                                                  +-------------+---------+------+---------+------------+--------------------  ---+   FV mid        no                                                                  +-------------+---------+------+---------+------------+--------------------  ---+  FV dist       no                                                                  +-------------+---------+------+---------+------------+--------------------  ---+   Popliteal     no                                                                  +-------------+---------+------+---------+------------+--------------------  ---+   GSV at [facility]               yes                0.937                                 +-------------+---------+------+---------+------------+--------------------  ---+   GSV prox                 yes                0.581     out of  fascia               thigh                                                                             +-------------+---------+------+---------+------------+--------------------  ---+   GSV mid thigh            yes                0.688     out of fascia              +-------------+---------+------+---------+------------+--------------------  ---+   GSV dist                 yes                0.751     tortuous and out of         thigh                                                 fascia                      +-------------+---------+------+---------+------------+--------------------  ---+   GSV at knee              yes                 1.04     out of fascia              +-------------+---------+------+---------+------------+--------------------  ---+  GSV prox calf                               0.597     out of fascia              +-------------+---------+------+---------+------------+--------------------  ---+   GSV mid calf                                0.635     out of fascia              +-------------+---------+------+---------+------------+--------------------  ---+   SSV Pop Fossa no                            0.516                                 +-------------+---------+------+---------+------------+--------------------  ---+   SSV prox calf no                            0.364                                 +-------------+---------+------+---------+------------+--------------------  ---+   SSV mid calf                                0.337                                 +-------------+---------+------+---------+------------+--------------------  ---+      +--------------+--------+------+----------+------------+-------------------  ----+   LEFT           Reflux   Reflux   Reflux   Diameter cms Comments                                   No        Yes      Time                                             +--------------+--------+------+----------+------------+-------------------  ----+   CFV                      yes                                                      +--------------+--------+------+----------+------------+-------------------  ----+   FV prox        no                                                                 +--------------+--------+------+----------+------------+-------------------  ----+  FV mid         no                                                                 +--------------+--------+------+----------+------------+-------------------  ----+   FV dist        no                                                                 +--------------+--------+------+----------+------------+-------------------  ----+   Popliteal      no                                                                 +--------------+--------+------+----------+------------+-------------------  ----+   GSV at [facility]                                             prior                                                                               ablation/stripping         +--------------+--------+------+----------+------------+-------------------  ----+   GSV prox thigh                                         prior                                                                               ablation/stripping         +--------------+--------+------+----------+------------+-------------------  ----+   GSV mid thigh                                          prior  ablation/stripping         +--------------+--------+------+----------+------------+-------------------  ----+   GSV dist thigh                                         prior                                                                               ablation/stripping          +--------------+--------+------+----------+------------+-------------------  ----+   GSV at knee                                            prior                                                                               ablation/stripping         +--------------+--------+------+----------+------------+-------------------  ----+   SSV Pop Fossa  no                            0.281                                +--------------+--------+------+----------+------------+-------------------  ----+   SSV prox calf  no                            0.316                                +--------------+--------+------+----------+------------+-------------------  ----+   SSV mid calf                                 0.300                                +--------------+--------+------+----------+------------+-------------------  ----+     ASSESSMENT/PLAN:: 86 y.o. female presenting with signs and symptoms consistent with bilateral lower extremity venous insufficiency.  She has a history of left-sided greater saphenous vein stripping.   Bilateral lower extremity venous duplex ultrasound noted a large, tortuous right-sided greater saphenous vein with significant reflux.  On physical exam this was readily palpable to the groin.  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  Palpable pulse in the feet.  Venous disease is classified as CEAP4.  I had a long discussion with Shine regarding chronic venous insufficiency and  options of management which include both conservative therapy with the use of compression stockings, as well as operative saphenous vein stripping.  After discussing the risks and benefits of each, Saira elected to pursue conservative therapy with the use of compression stockings.  She is aware, that should this not work, I am happy to offer saphenous vein stripping.  She would like to do everything [possible to avoid surgery.    Yamaira was asked to call my office should any questions or concerns arise, or ulcerations develop on the legs.   Broadus John, MD [facility] [phone]

## 2022-01-09 ENCOUNTER — Ambulatory Visit (INDEPENDENT_AMBULATORY_CARE_PROVIDER_SITE_OTHER): Payer: Medicare HMO | Admitting: Vascular Surgery

## 2022-01-09 ENCOUNTER — Encounter: Payer: Self-pay | Admitting: Vascular Surgery

## 2022-01-09 ENCOUNTER — Other Ambulatory Visit: Payer: Self-pay

## 2022-01-09 ENCOUNTER — Ambulatory Visit (HOSPITAL_COMMUNITY)
Admission: RE | Admit: 2022-01-09 | Discharge: 2022-01-09 | Disposition: A | Discharge status: Home / Self Care | Payer: Medicare HMO | Source: Ambulatory Visit | Attending: Vascular Surgery | Admitting: Vascular Surgery

## 2022-01-09 VITALS — BP 147/76 | HR 69 | Temp 97.3°F | Resp 16 | Ht 62.0 in | Wt 162.0 lb

## 2022-01-09 DIAGNOSIS — I872 Venous insufficiency (chronic) (peripheral): Secondary | ICD-10-CM | POA: Diagnosis not present

## 2022-01-09 DIAGNOSIS — M79606 Pain in leg, unspecified: Secondary | ICD-10-CM | POA: Diagnosis not present

## 2022-01-09 DIAGNOSIS — M7989 Other specified soft tissue disorders: Secondary | ICD-10-CM

## 2022-01-19 ENCOUNTER — Telehealth: Payer: Self-pay | Admitting: Physical Medicine and Rehabilitation

## 2022-01-19 NOTE — Telephone Encounter (Signed)
Patient is still experiencing leg pain and is requesting another appointment to be seen.

## 2022-01-21 DIAGNOSIS — M0600 Rheumatoid arthritis without rheumatoid factor, unspecified site: Secondary | ICD-10-CM | POA: Diagnosis not present

## 2022-01-27 ENCOUNTER — Telehealth: Payer: Self-pay | Admitting: Pharmacist

## 2022-01-27 NOTE — Chronic Care Management (AMB) (Signed)
Chronic Care Management Pharmacy Assistant   Name: Cheryl Wood  MRN: [id number] DOB: Jan 06, 1936   Reason for Encounter: Diabetes Adherence Call    Recent office visits:  12/31/2021 OV (PCP) Allwardt, Randa Evens, PA-C; Will Rx Augmentin at this time, take with food. Cautioned on antibiotic use and possible side effects. Advised nasal saline, humidifier, Mucinex, Robitussin, and pushing fluids. Call if worse or no improvement.   12/08/2021 OV (PCP) Allwardt, [name] M, PA-C; no medication changes indicated.  Recent consult visits:  01/09/2022 OV (Vasc Surg) Broadus John, MD; no medication changes indicated.  12/01/2021 OV (Ophthalmology) Edilia Bo, Tracie Harrier, MD; no medication changes indicated.  11/26/2021 OV (Urogyn) Jaquita Folds, MD; no medication changes indicated.  Hospital visits:  None in previous 6 months  Medications: Outpatient Encounter Medications as of 01/27/2022  Medication Sig   acetaminophen (TYLENOL) 650 MG CR tablet Take 650 mg by mouth every 8 (eight) hours as needed for pain.   b complex vitamins capsule Take 1 capsule by mouth daily.   brimonidine (ALPHAGAN) 0.2 % ophthalmic solution Place 1 drop into both eyes 2 (two) times daily.   clopidogrel (PLAVIX) 75 MG tablet TAKE ONE TABLET BY MOUTH DAILY (Patient taking differently: Take 75 mg by mouth daily.)   erythromycin ophthalmic ointment Place into the right eye nightly. Per Dr. Edilia Bo, apply to right eye nightly.   erythromycin ophthalmic ointment SMARTSIG:In Eye(s)   estradiol (ESTRACE) 0.1 MG/GM vaginal cream Place 0.5 g vaginally 2 (two) times a week. Place 0.5g twice a week   famciclovir (FAMVIR) 500 MG tablet Take 500 mg by mouth daily.    famotidine (PEPCID) 20 MG tablet Take 1 tablet (20 mg total) by mouth 2 (two) times daily.   FARXIGA 5 MG TABS tablet TAKE ONE TABLET BY MOUTH DAILY BEFORE BREAKFAST   furosemide (LASIX) 80 MG tablet TAKE TWO TABLETS BY MOUTH DAILY   glucose blood (CONTOUR  NEXT TEST) test strip Use as instructed   inFLIXimab (REMICADE) 100 MG injection Inject 100 mg into the vein every 8 (eight) weeks.   KLOR-CON M20 20 MEQ tablet TAKE ONE TABLET BY MOUTH DAILY   Lancets MISC Check blood sugar once daily. Contour Next Device.   loratadine (CLARITIN) 10 MG tablet Take 10 mg by mouth daily.   losartan (COZAAR) 100 MG tablet TAKE ONE TABLET BY MOUTH DAILY   lovastatin (MEVACOR) 20 MG tablet TAKE ONE TABLET BY MOUTH EVERY EVENING   Multiple Vitamins-Minerals (CENTRUM SILVER 50+WOMEN PO) Take 1 tablet by mouth daily.   mycophenolate (CELLCEPT) 500 MG tablet Take 500 mg by mouth 2 (two) times daily.   pramipexole (MIRAPEX) 0.25 MG tablet TAKE ONE TABLET BY MOUTH EVERY NIGHT AT BEDTIME (Patient taking differently: Take 0.25 mg by mouth daily.)   prednisoLONE acetate (PRED FORTE) 1 % ophthalmic suspension Place 1 drop into both eyes in the morning and at bedtime.   sertraline (ZOLOFT) 25 MG tablet TAKE ONE TABLET BY MOUTH DAILY   timolol (BETIMOL) 0.5 % ophthalmic solution Place 1 drop into the right eye 2 (two) times daily.   Vibegron 75 MG TABS Take 1 tablet by mouth daily.   No facility-administered encounter medications on file as of 01/27/2022.   Recent Relevant Labs: Lab Results  Component Value Date/Time   HGBA1C 7.3 (H) 12/08/2021 09:44 AM   HGBA1C 8.1 (A) 08/15/2021 10:25 AM   HGBA1C 6.9 (H) 01/03/2021 08:44 AM   HGBA1C 7.0 11/29/2019 12:00 AM   MICROALBUR <  0.7 07/03/2020 09:08 AM   MICROALBUR 0.9 01/25/2019 11:49 AM    Kidney Function Lab Results  Component Value Date/Time   CREATININE 1.03 12/08/2021 09:44 AM   CREATININE 1.06 (H) 08/28/2021 10:11 AM   CREATININE 1.07 (H) 10/03/2020 08:54 AM   GFR 50.24 (L) 12/08/2021 09:44 AM   GFRNONAA 52 (L) 08/28/2021 10:11 AM   GFRNONAA 48 (L) 10/03/2020 08:54 AM   GFRAA 56 (L) 10/03/2020 08:54 AM    Current antihyperglycemic regimen:  Farxiga 5 mg  What recent interventions/DTPs have been made to  improve glycemic control:  No recent interventions or DTPs.  Have there been any recent hospitalizations or ED visits since last visit with CPP? No  Patient denies hypoglycemic symptoms.  Patient denies hyperglycemic symptoms.  How often are you checking your blood sugar? once daily  What are your blood sugars ranging?  Fasting: 123   During the week, how often does your blood glucose drop below 70? Never  Are you checking your feet daily/regularly?   Adherence Review: Is the patient currently on a STATIN medication? Yes Is the patient currently on ACE/ARB medication? Yes Does the patient have >5 day gap between last estimated fill dates? No   Care Gaps: Medicare Annual Wellness: Completed 03/10/2022 Ophthalmology Exam: Overdue since 12/27/2021 Foot Exam: Overdue since 10/31/2020 Hemoglobin A1C: 7.3% on 12/08/2021 Dexa Scan: Completed Mammogram: Next due on 09/15/2022  Future Appointments  Date Time Provider [place]  01/29/2022  2:30 PM Magnus Sinning, MD [facility] None  02/25/2022 11:20 AM Jaquita Folds, MD [facility]  03/16/2022  8:00 AM [facility] HEALTH COACH [facility]EC  04/10/2022  9:20 AM Broadus John, MD [facility]  05/25/2022  3:30 PM [facility] CCM PHARMACIST [facility]  05/29/2022 11:20 AM Jaquita Folds, MD [facility]    Star Rating Drugs: Wilder Glade 5 mg last filled 01/09/2022 30 DS Losartan Potassium 100 mg last filled 01/02/2022 90 DS Lovastatin 20 mg last filled 01/07/2022 90 DS  April D Calhoun, [place] Pharmacist Assistant [phone]

## 2022-01-29 ENCOUNTER — Other Ambulatory Visit: Payer: Self-pay

## 2022-01-29 ENCOUNTER — Encounter: Payer: Self-pay | Admitting: Physical Medicine and Rehabilitation

## 2022-01-29 ENCOUNTER — Ambulatory Visit (INDEPENDENT_AMBULATORY_CARE_PROVIDER_SITE_OTHER): Payer: Medicare HMO | Admitting: Physical Medicine and Rehabilitation

## 2022-01-29 DIAGNOSIS — M419 Scoliosis, unspecified: Secondary | ICD-10-CM

## 2022-01-29 DIAGNOSIS — M4726 Other spondylosis with radiculopathy, lumbar region: Secondary | ICD-10-CM

## 2022-01-29 DIAGNOSIS — M5416 Radiculopathy, lumbar region: Secondary | ICD-10-CM

## 2022-01-29 DIAGNOSIS — M48061 Spinal stenosis, lumbar region without neurogenic claudication: Secondary | ICD-10-CM | POA: Diagnosis not present

## 2022-01-29 DIAGNOSIS — R269 Unspecified abnormalities of gait and mobility: Secondary | ICD-10-CM | POA: Diagnosis not present

## 2022-01-29 NOTE — Progress Notes (Signed)
Pt state lower back pain mostly on her left side and leg. Pt state it hurts all the time and mostly when she put pressure on her left leg. Pt state she takes over the counter pain meds to help ease her pain. Pt has hx of inj that has helped in the pass.  Numeric Pain Rating Scale and Functional Assessment Average Pain 10 Pain Right Now 7 My pain is constant and aching Pain is worse with: walking, sitting, standing, and some activites Pain improves with: medication and injections   In the last MONTH (on 0-10 scale) has pain interfered with the following?  1. General activity like being  able to carry out your everyday physical activities such as walking, climbing stairs, carrying groceries, or moving a chair?  Rating(7)  2. Relation with others like being able to carry out your usual social activities and roles such as  activities at home, at work and in your community. Rating(8)  3. Enjoyment of life such that you have  been bothered by emotional problems such as feeling anxious, depressed or irritable?  Rating(9)

## 2022-01-29 NOTE — Progress Notes (Signed)
Cheryl Wood - 86 y.o. female MRN [id number]  Date of birth: 1936-01-07  Office Visit Note: Visit Date: 01/29/2022 PCP: Fredirick Lathe, PA-C Referred by: Allwardt, Randa Evens, PA-C  Subjective: Chief Complaint  Patient presents with   Lower Back - Pain   Left Leg - Pain   HPI: Cheryl Wood is a 86 y.o. female who comes in today For evaluation of chronic, worsening and severe left sided lower back pain radiating to buttock and posterior leg to heel of foot. Patient reports pain has been ongoing for several months. She states her pain is exacerbated by walking and standing, describes as a constant soreness sensation, currently rates as 7 out of 10. Patient reports some relief with home exercises, rest and use of medications. Patients lumbar MRI from 2019 exhibits marked levoscoliosis apex at L1-L2, mild central canal stenosis at L3-L4, and mild left foraminal narrowing at L4-L5 and L5-S1. No high grade spinal canal stenosis. Patient has had multiple lumbar epidural steroid injections by Dr. Magnus Sinning and reports minimal relief of pain with these procedures. 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 Patient states she is getting concerned that this issue is going to be something she has to learn to live with. Patient states she lives alone in apartment and does use a rolling walker to assist with ambulation and prevent falls. Patient denies focal weakness, numbness and tingling. Patient denies recent trauma or falls.   Review of Systems  Musculoskeletal:  Positive for back pain.  Neurological:  Negative for tingling, sensory change, focal weakness and weakness.  All other systems reviewed and are negative. Otherwise per HPI.  Assessment & Plan: Visit Diagnoses:    ICD-10-CM   1. Lumbar radiculopathy  M54.16 MR LUMBAR SPINE WO CONTRAST    2. Other spondylosis with radiculopathy, lumbar region   M47.26     3. Scoliosis of thoracolumbar spine, unspecified scoliosis type  M41.9     4. Foraminal stenosis of lumbar region  M48.061     5. Gait abnormality  R26.9        Plan: Findings:  Chronic, worsening and severe left sided lower back pain radiating to buttock and posterior leg to heel of foot. Patient continue to have severe pain despite good conservative therapies such as home exercise regimen, rest and use of medications. Patients clinical presentation and exam are consistent with S1 radiculopathy which does not directly correlate with patients lumbar MRI. Her pain pattern has consistently involved the left leg, however her pain is now traveling to posterior aspect of the leg and down into heel, which is a new complaint for her. We feel the next step is to place an order for new lumbar MRI imaging. We did speak with patient about potential for performing lumbar epidural steroid injection after we review new lumbar MRI imaging. We will have patient follow up with [place] for MRI review once the imaging returns and to discuss further treatment options. Patient encouraged to remain active and to continue home exercises as tolerated. Patient instructed to continue using rolling walker to assist with ambulation and prevent falls.    Meds & Orders: No orders of the defined types were placed in this encounter.   Orders Placed This Encounter  Procedures   MR LUMBAR SPINE WO CONTRAST    Follow-up: Return for follow-up after lumbar MRI imaging is complete for review.  Procedures: No procedures performed      Clinical History: MRI lumbar spine:   TECHNIQUE: Sagittal and axial T1 and T2-weighted sequences were performed. Additional sagittal STIR images were performed.   INDICATION: Back pain   COMPARISON: None available   FINDINGS:  #  Marked Levoscoliosis apex at L1-2.  #  Vertebral body heights are well maintained.  #  There is type II Modic and plate change seen throughout the lumbar  spine..  #  Conus terminates at L1 without evidence of tethering.  #  Nerve roots appear normal.  #  Incidental findings: None.    #  L1-2: Mild degenerative disc disease. Disc bulge without significant central canal stenosis or neuroforaminal narrowing.  #    #  L2-3: Mild degenerative disc disease. Central canal is well-maintained. Neural foramina are patent.  #    #  L3-4: Mild degenerative disc disease. There is left foraminal disc osteophyte causing moderate neural foraminal narrowing. There is mild central canal stenosis.  #    #  L4-5: Moderate degenerative disc disease. There is left-sided facet arthropathy and foraminal spurring causing mild neural foraminal narrowing. No significant central canal stenosis.  #    #  L5-S1: Mild degenerative disc disease. There is facet arthropathy and disc uncovering causing mild left neural foraminal narrowing. No significant central canal stenosis.    IMPRESSION:   1.  Lumbar spondylosis most significant at L3-4 with left foraminal disc osteophyte causing moderate neural foraminal narrowing and mild central canal stenosis reflecting scoliotic curvature.   2.   There is mild left neural foraminal narrowing seen at L4-5 and L5-S1   3.  Marked Levoscoliosis apex at L1-2.   Electronically Signed by: Ritta Slot  Date: 11/14/18   She reports that she quit smoking about 50 years ago. Her smoking use included cigarettes. She has never used smokeless tobacco.  Recent Labs    08/15/21 1025 12/08/21 0944  HGBA1C 8.1* 7.3*    Objective:  VS:  HT:     WT:    BMI:      BP:    HR: bpm   TEMP: ( )   RESP:  Physical Exam Vitals and nursing note reviewed.  HENT:     Head: Normocephalic and atraumatic.     Right Ear: External ear normal.     Left Ear: External ear normal.     Nose: Nose normal.     Mouth/Throat:     Mouth: Mucous membranes are moist.  Eyes:     Extraocular Movements: Extraocular movements intact.  Cardiovascular:     Rate  and Rhythm: Normal rate.     Pulses: Normal pulses.  Pulmonary:     Effort: Pulmonary effort is normal.  Abdominal:     General: Abdomen is flat. There is no distension.  Musculoskeletal:        General: Tenderness present.     Cervical back: Normal range of motion.     Comments: Pt is slow to rise from seated position. Good lumbar range of motion. Strong distal strength without clonus, no pain upon palpation of greater trochanters. Sensation intact bilaterally. Ambulates with rolling walker, gait slow and unsteady.    Skin:    General: Skin is warm and dry.     Capillary Refill: Capillary refill takes less than 2 seconds.  Neurological:     Mental Status: She is alert and oriented to person, place, and time.     Gait: Gait abnormal.  Psychiatric:        Mood and Affect: Mood normal.    Ortho Exam  Imaging: No results found.  Past Medical/Family/Surgical/Social History: Medications & Allergies reviewed per EMR, new medications updated. 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 genitalia area   Blood in stool    Chronic kidney disease    Depression    Glaucoma    Heart murmur    History of  chicken pox    History of recurrent UTIs    History of stomach ulcers    Hyperlipidemia    Hypertension    Memory loss    Rheumatic fever    Seizures ([place])    Stroke ([place])    4259; embolic   TIA (transient ischemic attack) 10/22/2013   Urine incontinence    Family History  Problem Relation Age of Onset   Cancer Mother    Early death Mother    Heart disease Father    Arthritis Father    Early death Father    Hearing loss Father    Hypertension Father    Hyperlipidemia Father    Cancer Sister    Arthritis Sister    Arthritis Brother    Diabetes Brother    Heart attack Brother    Heart disease Brother    Hyperlipidemia Brother    Hypertension Brother    Diabetes Daughter    Heart disease Daughter    Hyperlipidemia Daughter    Hyperlipidemia Maternal Grandmother    Hypertension Maternal Grandmother    Stroke Maternal Grandmother    Alcohol abuse Maternal Grandfather    Early death Maternal Grandfather        tuberculosis   Arthritis Paternal Grandmother    Alcohol abuse Paternal Grandfather    Heart disease Paternal Grandfather    Hyperlipidemia Paternal Grandfather    Hypertension Paternal Grandfather    Stroke Paternal Grandfather    Alcohol abuse Sister    COPD Sister    Drug abuse Sister    Early death Sister    Heart disease Sister    Hypertension Sister    Hyperlipidemia Sister    Early death Brother    Early death Brother    Past Surgical History:  Procedure Laterality Date   ABDOMINAL HYSTERECTOMY     APPENDECTOMY  1983   CATARACT EXTRACTION Right May 13, 2014   EYE SURGERY  12/13/2018   stent inserted in left eye   gum transplant     RIGHT/LEFT HEART CATH AND CORONARY ANGIOGRAPHY N/A 05/12/2018   Procedure: RIGHT/LEFT HEART CATH AND CORONARY ANGIOGRAPHY;  Surgeon: Belva Crome, MD;  Location: [facility];  Service: Cardiovascular;  Laterality: N/A;   THORACIC AORTOGRAM N/A  05/12/2018   Procedure: THORACIC AORTOGRAM;  Surgeon: Belva Crome, MD;  Location: [facility];  Service: Cardiovascular;  Laterality: N/A;   TONSILLECTOMY     Social History   Occupational History    Employer: RETIRED  Tobacco Use   Smoking status: Former    Years: 40.00    Types: Cigarettes    Quit date: 11/05/1971    Years since quitting: 50.2   Smokeless tobacco: Never  Vaping Use   Vaping Use: Never used  Substance and Sexual Activity   Alcohol use: No    Alcohol/week: 0.0 standard drinks   Drug use: No   Sexual activity: Not Currently

## 2022-02-05 ENCOUNTER — Other Ambulatory Visit: Payer: Self-pay | Admitting: Physician Assistant

## 2022-02-12 ENCOUNTER — Other Ambulatory Visit: Payer: Self-pay

## 2022-02-12 MED ORDER — POTASSIUM CHLORIDE CRYS ER 20 MEQ PO TBCR: 20.0000 meq | EXTENDED_RELEASE_TABLET | Freq: Every day | ORAL | 2 refills | Status: DC

## 2022-02-12 MED ORDER — PRAMIPEXOLE DIHYDROCHLORIDE 0.25 MG PO TABS: 0.2500 mg | ORAL_TABLET | Freq: Every day | ORAL | 1 refills | Status: DC

## 2022-02-13 ENCOUNTER — Ambulatory Visit
Admission: RE | Admit: 2022-02-13 | Discharge: 2022-02-13 | Disposition: A | Discharge status: Home / Self Care | Payer: Medicare HMO | Source: Ambulatory Visit | Attending: Physical Medicine and Rehabilitation | Admitting: Physical Medicine and Rehabilitation

## 2022-02-13 ENCOUNTER — Other Ambulatory Visit: Payer: Self-pay

## 2022-02-13 DIAGNOSIS — M48061 Spinal stenosis, lumbar region without neurogenic claudication: Secondary | ICD-10-CM | POA: Diagnosis not present

## 2022-02-13 DIAGNOSIS — M25552 Pain in left hip: Secondary | ICD-10-CM | POA: Diagnosis not present

## 2022-02-13 DIAGNOSIS — M545 Low back pain, unspecified: Secondary | ICD-10-CM | POA: Diagnosis not present

## 2022-02-18 DIAGNOSIS — M0600 Rheumatoid arthritis without rheumatoid factor, unspecified site: Secondary | ICD-10-CM | POA: Diagnosis not present

## 2022-02-19 ENCOUNTER — Telehealth: Payer: Self-pay | Admitting: Physical Medicine and Rehabilitation

## 2022-02-19 NOTE — Telephone Encounter (Signed)
Patient called. She would like to make sure that Dr. Ernestina Patches can see her results from her MRI. Her call back number is [phone]

## 2022-02-25 ENCOUNTER — Ambulatory Visit: Payer: Medicare HMO | Admitting: Obstetrics and Gynecology

## 2022-02-25 NOTE — Progress Notes (Signed)
[facility] ? ? ?Subjective:  ?  ? ?Chief Complaint:  ?Chief Complaint  ?Patient presents with  ? Pessary Check  ? ? ? ?History of Present Illness: ?Cheryl Wood is a 86 y.o. female with stress incontinence and OAB who presents for a pessary check. She is using a size #0 incontinence ring pessary. She has had no issues with the pessary. Using the vaginal estrogen cream. Has occasional spotting ? ?Currently on Gemtesa 75mg  for overactive bladder. Not having any bladder leakage.  ? ?Past Medical History: ?Patient  has a past medical history of Allergy, Arthritis, Bleeding of blood vessel, Blood in stool, Chronic kidney disease, Depression, Glaucoma, Heart murmur, History of chicken pox, History of recurrent UTIs, History of stomach ulcers, Hyperlipidemia, Hypertension, Memory loss, Rheumatic fever, Seizures ([place]), Stroke ([place]), TIA (transient ischemic attack) (10/22/2013), and Urine incontinence.  ? ?Past Surgical History: ?She  has a past surgical history that includes Abdominal hysterectomy; gum transplant; Cataract extraction (Right, May 13, 2014); RIGHT/LEFT HEART CATH AND CORONARY ANGIOGRAPHY (N/A, 05/12/2018); THORACIC AORTOGRAM (N/A, 05/12/2018); Eye surgery (12/13/2018); Appendectomy (1983); and Tonsillectomy.  ? ?Medications: ?She has a current medication list which includes the following prescription(s): acetaminophen, b complex vitamins, brimonidine, clopidogrel, erythromycin, erythromycin, estradiol, famciclovir, famotidine, farxiga, furosemide, contour next test, remicade, lancets, loratadine, losartan, lovastatin, multiple vitamins-minerals, mycophenolate, potassium chloride sa, pramipexole, prednisolone acetate, sertraline, timolol, and vibegron.  ? ?Allergies: ?Patient is allergic to dorzolamide hcl-timolol mal, tetanus toxoids, and hctz [hydrochlorothiazide].  ? ?Social History: ?Patient  reports that she quit smoking about 50 years ago. Her smoking use included cigarettes. She has never  used smokeless tobacco. She reports that she does not drink alcohol and does not use drugs.  ? ?  ? ?Objective:  ?  ?Physical Exam: ?BP 106/66   Pulse 80   LMP  (LMP Unknown)  ?Gen: No apparent distress, A&O x 3. ? ?Detailed Urogynecologic Evaluation:  ?Pelvic Exam: Normal external female genitalia; Bartholin's and Skene's glands normal in appearance; urethral meatus normal in appearance, no urethral masses or discharge. The pessary was noted to be in place. It was removed and cleaned. Speculum exam revealed small lesions at the apex of the vagina with no active bleeding. The pessary was replaced. It was comfortable to the patient and fit well.  ? ?POP-Q (03/26/21):  ?  ?POP-Q ?  ?-2  ?                                          Aa   ?-2 ?                                          Ba   ?-6  ?                                            C  ?  ?1.5  ?                                          Gh   ?3  ?  Pb   ?6.5  ?                                          tvl  ?  ?-1.5  ?                                          Ap   ?-1.5  ?                                          Bp   ?   ?                                            D  ?  ?  ?  ? ?Assessment/Plan:  ?  ?Assessment: ?Cheryl Wood is a 86 y.o. with stress incontinence and OAB here for a pessary check.  ? ?Plan: ? ?SUI ?- Continue #0 incontinence ring pessary.  ?- Continue estrogen cream twice a week.  ? ?OAB ?- Continue with Gemtesa 75mg  daily.  ? ?Follow up 3 months.  ? ?Time spent: I spent 20 minutes dedicated to the care of this patient on the date of this encounter to include pre-visit review of records, face-to-face time with the patient and post visit documentation.  ? ?  ? ?

## 2022-02-26 ENCOUNTER — Other Ambulatory Visit: Payer: Self-pay

## 2022-02-26 ENCOUNTER — Encounter: Payer: Self-pay | Admitting: Obstetrics and Gynecology

## 2022-02-26 ENCOUNTER — Ambulatory Visit (INDEPENDENT_AMBULATORY_CARE_PROVIDER_SITE_OTHER): Payer: Medicare HMO | Admitting: Obstetrics and Gynecology

## 2022-02-26 VITALS — BP 106/66 | HR 80

## 2022-02-26 DIAGNOSIS — N3281 Overactive bladder: Secondary | ICD-10-CM | POA: Diagnosis not present

## 2022-02-26 DIAGNOSIS — N393 Stress incontinence (female) (male): Secondary | ICD-10-CM | POA: Diagnosis not present

## 2022-02-27 ENCOUNTER — Ambulatory Visit (INDEPENDENT_AMBULATORY_CARE_PROVIDER_SITE_OTHER): Payer: Medicare HMO | Admitting: Physical Medicine and Rehabilitation

## 2022-02-27 ENCOUNTER — Encounter: Payer: Self-pay | Admitting: Physical Medicine and Rehabilitation

## 2022-02-27 VITALS — BP 134/87 | HR 77

## 2022-02-27 DIAGNOSIS — M5416 Radiculopathy, lumbar region: Secondary | ICD-10-CM | POA: Diagnosis not present

## 2022-02-27 DIAGNOSIS — M419 Scoliosis, unspecified: Secondary | ICD-10-CM

## 2022-02-27 DIAGNOSIS — R269 Unspecified abnormalities of gait and mobility: Secondary | ICD-10-CM | POA: Diagnosis not present

## 2022-02-27 DIAGNOSIS — M4726 Other spondylosis with radiculopathy, lumbar region: Secondary | ICD-10-CM

## 2022-02-27 NOTE — Progress Notes (Signed)
Pt here to for an lumbar MRI review. Pt state she is having left hip pain. Pt state walking and standing makes the pain worse. Pt state she takes over the counter pain meds to help ease her pain. ? ?Numeric Pain Rating Scale and Functional Assessment ?Average Pain 9 ?Pain Right Now 7 ?My pain is constant and aching ?Pain is worse with: walking, sitting, standing, and some activites ?Pain improves with: heat/ice and medication ? ? ?In the last MONTH (on 0-10 scale) has pain interfered with the following? ? ?1. General activity like being  able to carry out your everyday physical activities such as walking, climbing stairs, carrying groceries, or moving a chair?  ?Rating(7) ? ?2. Relation with others like being able to carry out your usual social activities and roles such as  activities at home, at work and in your community. ?Rating(8) ? ?3. Enjoyment of life such that you have  been bothered by emotional problems such as feeling anxious, depressed or irritable?  ?Rating(9) ? ?

## 2022-02-27 NOTE — Progress Notes (Signed)
Cheryl Wood - 86 y.o. female MRN [id number]  Date of birth: 1936/01/12  Office Visit Note: Visit Date: 02/27/2022 PCP: Fredirick Lathe, PA-C Referred by: Allwardt, Randa Evens, PA-C  Subjective: Chief Complaint  Patient presents with   Left Hip - Pain   HPI: Cheryl Wood is a 86 y.o. female who comes in today for evaluation of chronic, worsening and severe left sided lower back pain radiating to buttock and posterior leg to heel of foot. Patient reports symptoms ongoing for several months. Patient reports pain is exacerbated by walking, standing and activity, describes as constant sore and shooting sensation, currently rates as 8 out of 10. Patient reports some relief of pain with home exercise regimen, rest and use of medications. Patient states she recently started using Voltaren gel and reports this medication does help to alleviate pain. Patient has attended formal physical therapy in the past at [facility] and reports some relief of pain with these treatments. Patients recent lumbar MRI exhibits advanced lumbar degeneration and scoliosis that has progressed since 2019, there is solid fusion at L2-L3 and L4-L5. There is also central disc protrusion at L5-S1 compressing both descending S1 nerve roots. No high grade spinal canal stenosis noted. Patient has had multiple lumbar epidural steroid injections in the past performed by Dr. Magnus Sinning with minimal short term relief of pain. 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 Patient states she lives alone in apartment and does use a rolling walker to assist with ambulation and prevent falls. Patient denies focal weakness, numbness and tingling. Patient denies recent trauma or falls.   Review of Systems  Musculoskeletal:  Positive for back pain.  Neurological:  Negative for tingling, sensory change, focal weakness and weakness.   All other systems reviewed and are negative. Otherwise per HPI.  Assessment & Plan: Visit Diagnoses:    ICD-10-CM   1. Lumbar radiculopathy  M54.16 Ambulatory referral to Physical Medicine Rehab    2. Other spondylosis with radiculopathy, lumbar region  M47.26 Ambulatory referral to Physical Medicine Rehab    3. Scoliosis of thoracolumbar spine, unspecified scoliosis type  M41.9     4. Gait abnormality  R26.9        Plan: Findings:  Chronic, worsening and severe left sided lower back pain radiating to buttock and posterior leg to heel of foot. Patient continues to have excruciating and debilitating pain despite good conservative therapies such as formal physical therapy, home exercise regimen, rest and use of medications. Patients clinical presentation and exam are consistent with S1 nerve pattern. Patient has had multiple epidural steroid injections in the past mostly to the upper lumbar spine, however her new lumbar MRI does exhibit central disc protrusion at L5-S1 compressing both S1 nerve roots. We believe the next step is to perform a diagnostic and hopefully therapeutic left S1 transforaminal epidural steroid injection under fluoroscopic guidance. Patient encouraged to continue home exercise regimen as tolerated. No red flag symptoms noted upon exam today.   Patient continues with physician directed home exercise program. Current medication management is not beneficial in increasing her functional status. Please note that procedures are done as part of a comprehensive orthopedic and pain management program with access to in-house orthopedics, spine surgery and physical therapy as well as access to [facility] biopsychosocial counseling if needed.     Meds & Orders: No orders of the  defined types were placed in this encounter.   Orders Placed This Encounter  Procedures   Ambulatory referral to Physical Medicine Rehab    Follow-up: Return for Left S1 transforaminal  epidural steroid injection.   Procedures: No procedures performed      Clinical History: EXAM: MRI LUMBAR SPINE WITHOUT CONTRAST   TECHNIQUE: Multiplanar, multisequence MR imaging of the lumbar spine was performed. No intravenous contrast was administered.   COMPARISON:  Outside lumbar MRI 11/14/2018   FINDINGS: Segmentation:  5 lumbar type vertebrae.   Alignment:  Marked scoliosis.  Exaggerated lower lumbar lordosis.   Vertebrae: No fracture, evidence of discitis, or bone lesion.Discogenic endplate edema at F0-Y7.   Conus medullaris and cauda equina: Conus extends to the L1-2 level. Conus and cauda equina appear normal.   Paraspinal and other soft tissues: Atrophy of intrinsic back muscles. Bilateral renal cortical cysts.   Disc levels:   T12- L1: Disc collapse eccentric to the right where there is greater endplate and facet spurring.   L1-L2: Disc collapse with endplate and facet spurring eccentric to the right. Right subarticular recess narrowing encroaching on the descending nerve roots, stable.   L2-L3: Intervertebral ankylosis.  No neural impingement   L3-L4: Asymmetric left disc bulging and facet spurring. Advanced left foraminal stenosis. Left subarticular recess narrowing without L4 compression given medial ization of the nerve roots.   L4-L5: Disc narrowing and ridging with eccentric left facet spurring and intervertebral ankylosis. Moderate left foraminal stenosis   L5-S1:Progressive disc collapse with endplate degeneration and asymmetric left facet spurring. A central disc protrusion compresses both descending S1 nerve roots. Progressive and moderate left foraminal impingement.   IMPRESSION: 1. Advanced lumbar spine degeneration with scoliosis and progression from 2019. L2/3 and L4/5 solid fusion. 2. L5-S1 discogenic edema with bilateral subarticular recess impingement and left foraminal impingement. 3. L3-4 severe left foraminal impingement. 4.  L1-2 moderate right subarticular recess stenosis encroaching on the descending nerve roots.     Electronically Signed   By: Jorje Guild M.D.   On: 02/13/2022 12:23   She reports that she quit smoking about 50 years ago. Her smoking use included cigarettes. She has never used smokeless tobacco.  Recent Labs    08/15/21 1025 12/08/21 0944  HGBA1C 8.1* 7.3*    Objective:  VS:  HT:     WT:    BMI:      BP:134/87   HR:77bpm   TEMP: ( )   RESP:  Physical Exam Vitals and nursing note reviewed.  HENT:     Head: Normocephalic and atraumatic.     Right Ear: External ear normal.     Left Ear: External ear normal.     Nose: Nose normal.     Mouth/Throat:     Mouth: Mucous membranes are moist.  Eyes:     Extraocular Movements: Extraocular movements intact.  Cardiovascular:     Rate and Rhythm: Normal rate.     Pulses: Normal pulses.  Pulmonary:     Effort: Pulmonary effort is normal.  Abdominal:     General: Abdomen is flat. There is no distension.  Musculoskeletal:        General: Tenderness present.     Cervical back: Normal range of motion.     Comments: Pt is slow to rise from seated position to standing. Good lumbar range of motion. Strong distal strength without clonus, no pain upon palpation of greater trochanters. Dysesthesias noted to left S1 dermatome. Sensation intact bilaterally. Ambulates with  walker, gait unsteady.   Skin:    General: Skin is warm and dry.     Capillary Refill: Capillary refill takes less than 2 seconds.  Neurological:     General: No focal deficit present.     Mental Status: She is alert and oriented to person, place, and time.  Psychiatric:        Mood and Affect: Mood normal.        Behavior: Behavior normal.    Ortho Exam  Imaging: No results found.  Past Medical/Family/Surgical/Social History: Medications & Allergies reviewed per EMR, new medications updated. Patient Active Problem List   Diagnosis Date Noted   Bilateral lower  extremity edema 10/22/2021   Lumbar radiculopathy 07/15/2021   Rheumatoid arthritis ([place]) 01/03/2021   Depression, major, single episode, mild ([place]) 09/29/2019   Polyarthralgia 04/21/2019   Diabetes mellitus without complication ([place]) 55/97/4163   CVA (cerebral vascular accident) ([place]) 01/25/2019   RLS (restless legs syndrome) 01/25/2019   Glaucoma, left eye 01/25/2019   Glaucoma associated with ocular inflammation, left, 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   Nonrheumatic aortic valve insufficiency    CAD in native artery    Dyspnea on exertion 03/30/2018   TIA (transient ischemic attack) 10/22/2013   HTN (hypertension) 10/22/2013   Hyperlipidemia 10/22/2013   Past Medical History:  Diagnosis Date   Allergy    Arthritis    Bleeding of blood vessel    ext. genitalia area   Blood in stool    Chronic kidney disease    Depression    Glaucoma    Heart murmur    History of chicken pox    History of recurrent UTIs    History of stomach ulcers    Hyperlipidemia    Hypertension    Memory loss    Rheumatic fever    Seizures ([place])    Stroke ([place])    8453; embolic   TIA (transient ischemic attack) 10/22/2013   Urine incontinence    Family History  Problem Relation Age of Onset   Cancer Mother    Early death Mother    Heart disease Father    Arthritis Father    Early death Father    Hearing loss Father    Hypertension Father    Hyperlipidemia Father    Cancer Sister    Arthritis Sister    Arthritis Brother    Diabetes Brother    Heart attack Brother    Heart disease Brother    Hyperlipidemia Brother    Hypertension Brother    Diabetes Daughter    Heart disease Daughter    Hyperlipidemia Daughter    Hyperlipidemia Maternal Grandmother    Hypertension Maternal Grandmother    Stroke Maternal Grandmother    Alcohol abuse Maternal Grandfather    Early death Maternal Grandfather        tuberculosis   Arthritis Paternal Grandmother    Alcohol abuse Paternal Grandfather     Heart disease Paternal Grandfather    Hyperlipidemia Paternal Grandfather    Hypertension Paternal Grandfather    Stroke Paternal Grandfather    Alcohol abuse Sister    COPD Sister    Drug abuse Sister    Early death Sister    Heart disease Sister    Hypertension Sister    Hyperlipidemia Sister    Early death Brother    Early death Brother    Past Surgical History:  Procedure Laterality Date   ABDOMINAL HYSTERECTOMY     APPENDECTOMY  1983  CATARACT EXTRACTION Right May 13, 2014   EYE SURGERY  12/13/2018   stent inserted in left eye   gum transplant     RIGHT/LEFT HEART CATH AND CORONARY ANGIOGRAPHY N/A 05/12/2018   Procedure: RIGHT/LEFT HEART CATH AND CORONARY ANGIOGRAPHY;  Surgeon: Belva Crome, MD;  Location: [place] [facility];  Service: Cardiovascular;  Laterality: N/A;   THORACIC AORTOGRAM N/A 05/12/2018   Procedure: THORACIC AORTOGRAM;  Surgeon: Belva Crome, MD;  Location: [place] [facility];  Service: Cardiovascular;  Laterality: N/A;   TONSILLECTOMY     Social History   Occupational History    Employer: RETIRED  Tobacco Use   Smoking status: Former    Years: 40.00    Types: Cigarettes    Quit date: 11/05/1971    Years since quitting: 50.3   Smokeless tobacco: Never  Vaping Use   Vaping Use: Never used  Substance and Sexual Activity   Alcohol use: No    Alcohol/week: 0.0 standard drinks   Drug use: No   Sexual activity: Not Currently

## 2022-03-02 ENCOUNTER — Telehealth: Payer: Self-pay | Admitting: Physical Medicine and Rehabilitation

## 2022-03-02 NOTE — Telephone Encounter (Signed)
Patient called. Returning a call to [place] 

## 2022-03-02 NOTE — Progress Notes (Signed)
? ?  I participated today with the patient's evaluation and treatment plan along with Barnet Pall, FNP  ?

## 2022-03-16 ENCOUNTER — Ambulatory Visit (INDEPENDENT_AMBULATORY_CARE_PROVIDER_SITE_OTHER): Payer: Medicare HMO

## 2022-03-16 VITALS — BP 138/78 | HR 62 | Temp 97.3°F | Wt 164.4 lb

## 2022-03-16 DIAGNOSIS — Z0000 Encounter for general adult medical examination without abnormal findings: Secondary | ICD-10-CM | POA: Diagnosis not present

## 2022-03-16 NOTE — Progress Notes (Addendum)
? ?Subjective:  ? Cheryl Wood is a 86 y.o. female who presents for Medicare Annual (Subsequent) preventive examination. ? ?Review of Systems    ? ?Cardiac Risk Factors include: advanced age (>89mn, >>45women);dyslipidemia;hypertension;diabetes mellitus;obesity (BMI >30kg/m2) ? ?   ?Objective:  ?  ?Today's Vitals  ? 03/16/22 0804 03/16/22 0810  ?BP: 138/78   ?Pulse: 62   ?Temp: (!) 97.3 ?F (36.3 ?C)   ?SpO2: 93%   ?Weight: 164 lb 6.4 oz (74.6 kg)   ?PainSc:  5   ? ?Body mass index is 30.07 kg/m?. ? ?Advanced Directives 03/16/2022 08/28/2021 07/23/2021 03/28/2021 03/10/2021 02/27/2020 01/30/2020  ?Does Patient Have a Medical Advance Directive? Yes No No No Yes Yes Yes  ?Type of Advance Directive Healthcare Power of [place] ?Does patient want to make changes to medical advance directive? - - - - - No - Patient declined No - Patient declined  ?Copy of [place] Chart? No - copy requested - - - No - copy requested No - copy requested No - copy requested  ?Would patient like information on creating a medical advance directive? - - No - Patient declined - - - -  ?Pre-existing out of facility DNR order (yellow form or pink MOST form) - - - - - - -  ? ? ?Current Medications (verified) ?Outpatient Encounter Medications as of 03/16/2022  ?Medication Sig  ? acetaminophen (TYLENOL) 650 MG CR tablet Take 650 mg by mouth every 8 (eight) hours as needed for pain.  ? b complex vitamins capsule Take 1 capsule by mouth daily.  ? brimonidine (ALPHAGAN) 0.2 % ophthalmic solution Place 1 drop into both eyes 2 (two) times daily.  ? clopidogrel (PLAVIX) 75 MG tablet TAKE ONE TABLET BY MOUTH DAILY (Patient taking differently: Take 75 mg by mouth daily.)  ? estradiol (ESTRACE) 0.1 MG/GM vaginal cream Place 0.5 g vaginally 2 (two) times a week. Place 0.5g twice a week  ? famciclovir (FAMVIR) 500 MG tablet Take 500 mg by  mouth daily.   ? famotidine (PEPCID) 20 MG tablet Take 1 tablet (20 mg total) by mouth 2 (two) times daily.  ? FARXIGA 5 MG TABS tablet TAKE ONE TABLET BY MOUTH DAILY BEFORE BREAKFAST  ? furosemide (LASIX) 80 MG tablet TAKE TWO TABLETS BY MOUTH DAILY  ? glucose blood (CONTOUR NEXT TEST) test strip Use as instructed  ? Lancets MISC Check blood sugar once daily. Contour Next Device.  ? loratadine (CLARITIN) 10 MG tablet Take 10 mg by mouth daily.  ? losartan (COZAAR) 100 MG tablet TAKE ONE TABLET BY MOUTH DAILY  ? lovastatin (MEVACOR) 20 MG tablet TAKE ONE TABLET BY MOUTH EVERY EVENING  ? Multiple Vitamins-Minerals (CENTRUM SILVER 50+WOMEN PO) Take 1 tablet by mouth daily.  ? mycophenolate (CELLCEPT) 500 MG tablet Take 500 mg by mouth 2 (two) times daily.  ? potassium chloride SA (KLOR-CON M20) 20 MEQ tablet Take 1 tablet (20 mEq total) by mouth daily.  ? pramipexole (MIRAPEX) 0.25 MG tablet Take 1 tablet (0.25 mg total) by mouth at bedtime.  ? prednisoLONE acetate (PRED FORTE) 1 % ophthalmic suspension Place 1 drop into both eyes in the morning and at bedtime.  ? sertraline (ZOLOFT) 25 MG tablet TAKE ONE TABLET BY MOUTH DAILY  ? timolol (BETIMOL) 0.5 % ophthalmic solution Place 1 drop into the right eye 2 (two) times daily.  ? Vibegron  75 MG TABS Take 1 tablet by mouth daily.  ? inFLIXimab (REMICADE) 100 MG injection Inject 100 mg into the vein every 8 (eight) weeks. (Patient not taking: Reported on 03/16/2022)  ? [DISCONTINUED] erythromycin ophthalmic ointment Place into the right eye nightly. Per Dr. Edilia Bo, apply to right eye nightly.  ? [DISCONTINUED] erythromycin ophthalmic ointment SMARTSIG:In Eye(s)  ? ?No facility-administered encounter medications on file as of 03/16/2022.  ? ? ?Allergies (verified) ?Dorzolamide hcl-timolol mal, Tetanus toxoids, and Hctz [hydrochlorothiazide]  ? ?History: ?Past Medical History:  ?Diagnosis Date  ? Allergy   ? Arthritis   ? Bleeding of blood vessel   ? ext. genitalia area  ?  Blood in stool   ? Chronic kidney disease   ? Depression   ? Glaucoma   ? Heart murmur   ? History of chicken pox   ? History of recurrent UTIs   ? History of stomach ulcers   ? Hyperlipidemia   ? Hypertension   ? Memory loss   ? Rheumatic fever   ? Seizures ([place])   ? Stroke [facility])   ? 6440; embolic  ? TIA (transient ischemic attack) 10/22/2013  ? Urine incontinence   ? ?Past Surgical History:  ?Procedure Laterality Date  ? ABDOMINAL HYSTERECTOMY    ? APPENDECTOMY  1983  ? CATARACT EXTRACTION Right May 13, 2014  ? EYE SURGERY  12/13/2018  ? stent inserted in left eye  ? gum transplant    ? RIGHT/LEFT HEART CATH AND CORONARY ANGIOGRAPHY N/A 05/12/2018  ? Procedure: RIGHT/LEFT HEART CATH AND CORONARY ANGIOGRAPHY;  Surgeon: Belva Crome, MD;  Location: [place] [facility];  Service: Cardiovascular;  Laterality: N/A;  ? THORACIC AORTOGRAM N/A 05/12/2018  ? Procedure: THORACIC AORTOGRAM;  Surgeon: Belva Crome, MD;  Location: [place] [facility];  Service: Cardiovascular;  Laterality: N/A;  ? TONSILLECTOMY    ? ?Family History  ?Problem Relation Age of Onset  ? Cancer Mother   ? Early death Mother   ? Heart disease Father   ? Arthritis Father   ? Early death Father   ? Hearing loss Father   ? Hypertension Father   ? Hyperlipidemia Father   ? Cancer Sister   ? Arthritis Sister   ? Arthritis Brother   ? Diabetes Brother   ? Heart attack Brother   ? Heart disease Brother   ? Hyperlipidemia Brother   ? Hypertension Brother   ? Diabetes Daughter   ? Heart disease Daughter   ? Hyperlipidemia Daughter   ? Hyperlipidemia Maternal Grandmother   ? Hypertension Maternal Grandmother   ? Stroke Maternal Grandmother   ? Alcohol abuse Maternal Grandfather   ? Early death Maternal Grandfather   ?     tuberculosis  ? Arthritis Paternal Grandmother   ? Alcohol abuse Paternal Grandfather   ? Heart disease Paternal Grandfather   ? Hyperlipidemia Paternal Grandfather   ? Hypertension Paternal Grandfather   ? Stroke Paternal Grandfather   ?  Alcohol abuse Sister   ? COPD Sister   ? Drug abuse Sister   ? Early death Sister   ? Heart disease Sister   ? Hypertension Sister   ? Hyperlipidemia Sister   ? Early death Brother   ? Early death Brother   ? ?Social History  ? ?Socioeconomic History  ? Marital status: Divorced  ?  Spouse name: Not on file  ? Number of children: 2  ? Years of education: college  ? Highest education  level: Not on file  ?Occupational History  ?  Employer: RETIRED  ?Tobacco Use  ? Smoking status: Former  ?  Years: 40.00  ?  Types: Cigarettes  ?  Quit date: 11/05/1971  ?  Years since quitting: 50.3  ? Smokeless tobacco: Never  ?Vaping Use  ? Vaping Use: Never used  ?Substance and Sexual Activity  ? Alcohol use: No  ?  Alcohol/week: 0.0 standard drinks  ? Drug use: No  ? Sexual activity: Not Currently  ?Other Topics Concern  ? Not on file  ?Social History Narrative  ? Patient lives at home alone  ? 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 Worked at [employer] for 18 years  ? Daughters in [place] and in [place]  ? Caffeine Use: 2-3 cups daily  ? ?Social Determinants of Health  ? ?Financial Resource Strain: Low Risk   ? Difficulty of Paying Living Expenses: Not hard at all  ?Food Insecurity: No Food Insecurity  ? Worried About Charity fundraiser in the Last Year: Never true  ? Ran Out of Food in the Last Year: Never true  ?Transportation Needs: No Transportation Needs  ? Lack of Transportation (Medical): No  ? Lack of Transportation (Non-Medical): No  ?Physical Activity: Inactive  ? Days of Exercise per Week: 0 days  ? Minutes of Exercise per Session: 0 min  ?Stress: No Stress Concern Present  ? Feeling of Stress : Not at all  ?Social Connections: Moderately Isolated  ? Frequency of Communication with Friends and Family: More than three times a week  ? Frequency of Social Gatherings with Friends and Family: More than three times a week  ? Attends Religious Services: 1 to 4 times per year  ? Active Member  of Clubs or Organizations: No  ? Attends Archivist Meetings: Never  ? Marital Status: Divorced  ? ? ?Tobacco Counseling ?Counseling given: Not Answered ? ? ?Clinical Intake: ? ?Pre-visit preparatio

## 2022-03-16 NOTE — Patient Instructions (Addendum)
Cheryl Wood , ?Thank you for taking time to come for your Medicare Wellness Visit. I appreciate your ongoing commitment to your health goals. Please review the following plan we discussed and let me know if I can assist you in the future.  ? ?Screening recommendations/referrals: ?Colonoscopy: No longer required  ?Mammogram: Done 09/15/21 repeat every year  ?Bone Density Completed 12/26/10  ?Recommended yearly ophthalmology/optometry visit for glaucoma screening and checkup ?Recommended yearly dental visit for hygiene and checkup ? ?Vaccinations: ?Influenza vaccine: Done 10/16/21 repeat every year  ?Pneumococcal vaccine: Up to date ?Tdap vaccine: Discontinued  ?Shingles vaccine: Shingrix discussed. Please contact your pharmacy for coverage information.    ?Covid-19:Completed 2/12, 3/12, 08/27/20 & 01/31/21, 10/16/21 ? ?Advanced directives: Please bring a copy of your health care power of attorney and living will to the office at your convenience. ? ?Conditions/risks identified: Get rid of left hip pain  ? ?Next appointment: Follow up in one year for your annual wellness visit  ? ? ?Preventive Care 84 Years and Older, Female ?Preventive care refers to lifestyle choices and visits with your health care provider that can promote health and wellness. ?What does preventive care include? ?A yearly physical exam. This is also called an annual well check. ?Dental exams once or twice a year. ?Routine eye exams. Ask your health care provider how often you should have your eyes checked. ?Personal lifestyle choices, including: ?Daily care of your teeth and gums. ?Regular physical activity. ?Eating a healthy diet. ?Avoiding tobacco and drug use. ?Limiting alcohol use. ?Practicing safe sex. ?Taking low-dose aspirin every day. ?Taking vitamin and mineral supplements as recommended by your health care provider. ?What happens during an annual well check? ?The services and screenings done by your health care provider during your annual well  check will depend on your age, overall health, lifestyle risk factors, and family history of disease. ?Counseling  ?Your health care provider may ask you questions about your: ?Alcohol use. ?Tobacco use. ?Drug use. ?Emotional well-being. ?Home and relationship well-being. ?Sexual activity. ?Eating habits. ?History of falls. ?Memory and ability to understand (cognition). ?Work and work Statistician. ?Reproductive health. ?Screening  ?You may have the following tests or measurements: ?Height, weight, and BMI. ?Blood pressure. ?Lipid and cholesterol levels. These may be checked every 5 years, or more frequently if you are over 68 years old. ?Skin check. ?Lung cancer screening. You may have this screening every year starting at age 58 if you have a 30-pack-year history of smoking and currently smoke or have quit within the past 15 years. ?Fecal occult blood test (FOBT) of the stool. You may have this test every year starting at age 18. ?Flexible sigmoidoscopy or colonoscopy. You may have a sigmoidoscopy every 5 years or a colonoscopy every 10 years starting at age 57. ?Hepatitis C blood test. ?Hepatitis B blood test. ?Sexually transmitted disease (STD) testing. ?Diabetes screening. This is done by checking your blood sugar (glucose) after you have not eaten for a while (fasting). You may have this done every 1-3 years. ?Bone density scan. This is done to screen for osteoporosis. You may have this done starting at age 29. ?Mammogram. This may be done every 1-2 years. Talk to your health care provider about how often you should have regular mammograms. ?Talk with your health care provider about your test results, treatment options, and if necessary, the need for more tests. ?Vaccines  ?Your health care provider may recommend certain vaccines, such as: ?Influenza vaccine. This is recommended every year. ?Tetanus, diphtheria, and  acellular pertussis (Tdap, Td) vaccine. You may need a Td booster every 10 years. ?Zoster  vaccine. You may need this after age 41. ?Pneumococcal 13-valent conjugate (PCV13) vaccine. One dose is recommended after age 43. ?Pneumococcal polysaccharide (PPSV23) vaccine. One dose is recommended after age 79. ?Talk to your health care provider about which screenings and vaccines you need and how often you need them. ?This information is not intended to replace advice given to you by your health care provider. Make sure you discuss any questions you have with your health care provider. ?Document Released: 01/10/2016 Document Revised: 09/02/2016 Document Reviewed: 10/15/2015 ?Elsevier Interactive Patient Education ? 2017 [employer]. ? ?Fall Prevention in the Home ?Falls can cause injuries. They can happen to people of all ages. There are many things you can do to make your home safe and to help prevent falls. ?What can I do on the outside of my home? ?Regularly fix the edges of walkways and driveways and fix any cracks. ?Remove anything that might make you trip as you walk through a door, such as a raised step or threshold. ?Trim any bushes or trees on the path to your home. ?Use bright outdoor lighting. ?Clear any walking paths of anything that might make someone trip, such as rocks or tools. ?Regularly check to see if handrails are loose or broken. Make sure that both sides of any steps have handrails. ?Any raised decks and porches should have guardrails on the edges. ?Have any leaves, snow, or ice cleared regularly. ?Use sand or salt on walking paths during winter. ?Clean up any spills in your garage right away. This includes oil or grease spills. ?What can I do in the bathroom? ?Use night lights. ?Install grab bars by the toilet and in the tub and shower. Do not use towel bars as grab bars. ?Use non-skid mats or decals in the tub or shower. ?If you need to sit down in the shower, use a plastic, non-slip stool. ?Keep the floor dry. Clean up any water that spills on the floor as soon as it happens. ?Remove  soap buildup in the tub or shower regularly. ?Attach bath mats securely with double-sided non-slip rug tape. ?Do not have throw rugs and other things on the floor that can make you trip. ?What can I do in the bedroom? ?Use night lights. ?Make sure that you have a light by your bed that is easy to reach. ?Do not use any sheets or blankets that are too big for your bed. They should not hang down onto the floor. ?Have a firm chair that has side arms. You can use this for support while you get dressed. ?Do not have throw rugs and other things on the floor that can make you trip. ?What can I do in the kitchen? ?Clean up any spills right away. ?Avoid walking on wet floors. ?Keep items that you use a lot in easy-to-reach places. ?If you need to reach something above you, use a strong step stool that has a grab bar. ?Keep electrical cords out of the way. ?Do not use floor polish or wax that makes floors slippery. If you must use wax, use non-skid floor wax. ?Do not have throw rugs and other things on the floor that can make you trip. ?What can I do with my stairs? ?Do not leave any items on the stairs. ?Make sure that there are handrails on both sides of the stairs and use them. Fix handrails that are broken or loose. Make sure that  handrails are as long as the stairways. ?Check any carpeting to make sure that it is firmly attached to the stairs. Fix any carpet that is loose or worn. ?Avoid having throw rugs at the top or bottom of the stairs. If you do have throw rugs, attach them to the floor with carpet tape. ?Make sure that you have a light switch at the top of the stairs and the bottom of the stairs. If you do not have them, ask someone to add them for you. ?What else can I do to help prevent falls? ?Wear shoes that: ?Do not have high heels. ?Have rubber bottoms. ?Are comfortable and fit you well. ?Are closed at the toe. Do not wear sandals. ?If you use a stepladder: ?Make sure that it is fully opened. Do not climb a  closed stepladder. ?Make sure that both sides of the stepladder are locked into place. ?Ask someone to hold it for you, if possible. ?Clearly mark and make sure that you can see: ?Any grab bars or handrails.

## 2022-03-17 DIAGNOSIS — H209 Unspecified iridocyclitis: Secondary | ICD-10-CM | POA: Diagnosis not present

## 2022-03-17 DIAGNOSIS — H4042X2 Glaucoma secondary to eye inflammation, left eye, moderate stage: Secondary | ICD-10-CM | POA: Diagnosis not present

## 2022-03-17 DIAGNOSIS — H35033 Hypertensive retinopathy, bilateral: Secondary | ICD-10-CM | POA: Diagnosis not present

## 2022-03-17 DIAGNOSIS — Z961 Presence of intraocular lens: Secondary | ICD-10-CM | POA: Diagnosis not present

## 2022-03-19 ENCOUNTER — Other Ambulatory Visit: Payer: Self-pay

## 2022-03-19 ENCOUNTER — Encounter: Payer: Self-pay | Admitting: Physical Medicine and Rehabilitation

## 2022-03-19 ENCOUNTER — Ambulatory Visit (INDEPENDENT_AMBULATORY_CARE_PROVIDER_SITE_OTHER): Payer: Medicare HMO | Admitting: Physical Medicine and Rehabilitation

## 2022-03-19 ENCOUNTER — Ambulatory Visit: Payer: Self-pay

## 2022-03-19 VITALS — BP 132/79 | HR 67

## 2022-03-19 DIAGNOSIS — M5416 Radiculopathy, lumbar region: Secondary | ICD-10-CM

## 2022-03-19 MED ORDER — METHYLPREDNISOLONE ACETATE 80 MG/ML IJ SUSP
80.0000 mg | Freq: Once | INTRAMUSCULAR | Status: AC
  Administered 2022-03-19: 80 mg

## 2022-03-19 NOTE — Progress Notes (Signed)
Pt state lower back pain that travels to her left hip pain. Pt state walking and standing makes the pain worse. Pt state she takes over the counter pain meds to help ease her pain. ? ?Numeric Pain Rating Scale and Functional Assessment ?Average Pain 7 ? ? ?In the last MONTH (on 0-10 scale) has pain interfered with the following? ? ?1. General activity like being  able to carry out your everyday physical activities such as walking, climbing stairs, carrying groceries, or moving a chair?  ?Rating(10) ? ? ?+Driver, +BT, -Dye Allergies. ? ?

## 2022-03-19 NOTE — Patient Instructions (Signed)

## 2022-03-29 NOTE — Procedures (Signed)
S1 Lumbosacral Transforaminal Epidural Steroid Injection - Sub-Pedicular Approach with Fluoroscopic Guidance  ? ?Patient: Cheryl Wood      ?Date of Birth: 1936/06/22 ?MRN: [id number] ?PCP: Allwardt, Randa Evens, PA-C      ?Visit Date: 03/19/2022 ?  ?Universal Protocol:    ?Date/Time: 03/29/2310:49 AM ? ?Consent Given By: the patient ? ?Position:  PRONE ? ?Additional Comments: ?Vital signs were monitored before and after the procedure. ?Patient was prepped and draped in the usual sterile fashion. ?The correct patient, procedure, and site was verified. ? ? ?Injection Procedure Details:  ?Procedure Site One ?Meds Administered:  ?Meds ordered this encounter  ?Medications  ? methylPREDNISolone acetate (DEPO-MEDROL) injection 80 mg  ? ? ?Laterality: Left ? ?Location/Site:  ?S1 Foramen  ? ?Needle size: 22 ga. ? ?Needle type: Spinal ? ?Needle Placement: Transforaminal ? ?Findings: ? ? -Comments: Excellent flow of contrast along the nerve, nerve root and into the epidural space. ? ?Epidurogram: Contrast epidurogram showed no nerve root cut off or restricted flow pattern. ? ?Procedure Details: ?After squaring off the sacral end-plate to get a true AP view, the C-arm was positioned so that the best possible view of the S1 foramen was visualized. The soft tissues overlying this structure were infiltrated with 2-3 ml. of 1% Lidocaine without Epinephrine.  ? ? ?The spinal needle was inserted toward the target using a "trajectory" view along the fluoroscope beam.  Under AP and lateral visualization, the needle was advanced so it did not puncture dura. Biplanar projections were used to confirm position. Aspiration was confirmed to be negative for CSF and/or blood. A 1-2 ml. volume of Isovue-250 was injected and flow of contrast was noted at each level. Radiographs were obtained for documentation purposes.  ? ?After attaining the desired flow of contrast documented above, a 0.5 to 1.0 ml test dose of 0.25% Marcaine was injected into  each respective transforaminal space.  The patient was observed for 90 seconds post injection.  After no sensory deficits were reported, and normal lower extremity motor function was noted,   the above injectate was administered so that equal amounts of the injectate were placed at each foramen (level) into the transforaminal epidural space. ? ? ?Additional Comments:  ?No complications occurred ?Dressing: Band-Aid with 2 x 2 sterile gauze ?  ? ?Post-procedure details: ?Patient was observed during the procedure. ?Post-procedure instructions were reviewed. ? ?Patient left the clinic in stable condition. ?

## 2022-03-29 NOTE — Progress Notes (Signed)
? ?Cheryl Wood - 86 y.o. female MRN [id number]  Date of birth: [birth year] ? ?Office Visit Note: ?Visit Date: 03/19/2022 ?PCP: Allwardt, Randa Evens, PA-C ?Referred by: Allwardt, Randa Evens, PA-C ? ?Subjective: ?Chief Complaint  ?Patient presents with  ? Lower Back - Pain  ? Left Hip - Pain  ? ?HPI:  Cheryl Wood is a 86 y.o. female who comes in today at the request of Barnet Pall, FNP for planned Left S1-2 Lumbar Transforaminal epidural steroid injection with fluoroscopic guidance.  The patient has failed conservative care including home exercise, medications, time and activity modification.  This injection will be diagnostic and hopefully therapeutic.  Please see requesting physician notes for further details and justification. ? ?ROS Otherwise per HPI. ? ?Assessment & Plan: ?Visit Diagnoses:  ?  ICD-10-CM   ?1. Lumbar radiculopathy  M54.16 XR C-ARM NO REPORT  ?  Epidural Steroid injection  ?  methylPREDNISolone acetate (DEPO-MEDROL) injection 80 mg  ?  ?  ?Plan: No additional findings.  ? ?Meds & Orders:  ?Meds ordered this encounter  ?Medications  ? methylPREDNISolone acetate (DEPO-MEDROL) injection 80 mg  ?  ?Orders Placed This Encounter  ?Procedures  ? XR C-ARM NO REPORT  ? Epidural Steroid injection  ?  ?Follow-up: Return if symptoms worsen or fail to improve.  ? ?Procedures: ?No procedures performed  ?S1 Lumbosacral Transforaminal Epidural Steroid Injection - Sub-Pedicular Approach with Fluoroscopic Guidance  ? ?Patient: Cheryl Wood      ?Date of Birth: 1936-07-19 ?MRN: [id number] ?PCP: Allwardt, Randa Evens, PA-C      ?Visit Date: 03/19/2022 ?  ?Universal Protocol:    ?Date/Time: 03/29/2310:49 AM ? ?Consent Given By: the patient ? ?Position:  PRONE ? ?Additional Comments: ?Vital signs were monitored before and after the procedure. ?Patient was prepped and draped in the usual sterile fashion. ?The correct patient, procedure, and site was verified. ? ? ?Injection Procedure Details:  ?Procedure Site One ?Meds  Administered:  ?Meds ordered this encounter  ?Medications  ? methylPREDNISolone acetate (DEPO-MEDROL) injection 80 mg  ? ? ?Laterality: Left ? ?Location/Site:  ?S1 Foramen  ? ?Needle size: 22 ga. ? ?Needle type: Spinal ? ?Needle Placement: Transforaminal ? ?Findings: ? ? -Comments: Excellent flow of contrast along the nerve, nerve root and into the epidural space. ? ?Epidurogram: Contrast epidurogram showed no nerve root cut off or restricted flow pattern. ? ?Procedure Details: ?After squaring off the sacral end-plate to get a true AP view, the C-arm was positioned so that the best possible view of the S1 foramen was visualized. The soft tissues overlying this structure were infiltrated with 2-3 ml. of 1% Lidocaine without Epinephrine.  ? ? ?The spinal needle was inserted toward the target using a "trajectory" view along the fluoroscope beam.  Under AP and lateral visualization, the needle was advanced so it did not puncture dura. Biplanar projections were used to confirm position. Aspiration was confirmed to be negative for CSF and/or blood. A 1-2 ml. volume of Isovue-250 was injected and flow of contrast was noted at each level. Radiographs were obtained for documentation purposes.  ? ?After attaining the desired flow of contrast documented above, a 0.5 to 1.0 ml test dose of 0.25% Marcaine was injected into each respective transforaminal space.  The patient was observed for 90 seconds post injection.  After no sensory deficits were reported, and normal lower extremity motor function was noted,   the above injectate was administered so that equal amounts of the injectate were placed at  each foramen (level) into the transforaminal epidural space. ? ? ?Additional Comments:  ?No complications occurred ?Dressing: Band-Aid with 2 x 2 sterile gauze ?  ? ?Post-procedure details: ?Patient was observed during the procedure. ?Post-procedure instructions were reviewed. ? ?Patient left the clinic in stable condition.   ? ?Clinical History: ?No specialty comments available.  ? ? ? ?Objective:  VS:  HT:    WT:   BMI:     BP:132/79  HR:67bpm  TEMP: ( )  RESP:  ?Physical Exam ?Vitals and nursing note reviewed.  ?Constitutional:   ?   General: She is not in acute distress. ?   Appearance: Normal appearance. She is not ill-appearing.  ?HENT:  ?   Head: Normocephalic and atraumatic.  ?   Right Ear: External ear normal.  ?   Left Ear: External ear normal.  ?Eyes:  ?   Extraocular Movements: Extraocular movements intact.  ?Cardiovascular:  ?   Rate and Rhythm: Normal rate.  ?   Pulses: Normal pulses.  ?Pulmonary:  ?   Effort: Pulmonary effort is normal. No respiratory distress.  ?Abdominal:  ?   General: There is no distension.  ?   Palpations: Abdomen is soft.  ?Musculoskeletal:     ?   General: Tenderness present.  ?   Cervical back: Neck supple.  ?   Right lower leg: No edema.  ?   Left lower leg: No edema.  ?   Comments: Patient has good distal strength with no pain over the greater trochanters.  No clonus or focal weakness.  ?Skin: ?   Findings: No erythema, lesion or rash.  ?Neurological:  ?   General: No focal deficit present.  ?   Mental Status: She is alert and oriented to person, place, and time.  ?   Sensory: No sensory deficit.  ?   Motor: No weakness or abnormal muscle tone.  ?   Coordination: Coordination normal.  ?Psychiatric:     ?   Mood and Affect: Mood normal.     ?   Behavior: Behavior normal.  ?  ? ?Imaging: ?No results found. ?

## 2022-03-30 ENCOUNTER — Telehealth: Payer: Self-pay | Admitting: Physical Medicine and Rehabilitation

## 2022-03-30 NOTE — Telephone Encounter (Signed)
Patient called wanting to let Dr. Ernestina Patches know the injection worked about 60% in the morning but,  the pain starts to get worse in the afternoon. Patient asked what the step is to help with the pain? The number to contact patient is [phone] ?

## 2022-03-31 ENCOUNTER — Telehealth: Payer: Self-pay | Admitting: Physical Medicine and Rehabilitation

## 2022-03-31 ENCOUNTER — Other Ambulatory Visit: Payer: Self-pay | Admitting: Physical Medicine and Rehabilitation

## 2022-03-31 DIAGNOSIS — M5416 Radiculopathy, lumbar region: Secondary | ICD-10-CM

## 2022-03-31 NOTE — Telephone Encounter (Signed)
Pt called and states last injection did not work. She would like to speak about the next step.  ? ?CB [phone]  ?

## 2022-04-01 ENCOUNTER — Ambulatory Visit: Payer: Medicare HMO | Admitting: Physical Medicine and Rehabilitation

## 2022-04-07 ENCOUNTER — Telehealth: Payer: Self-pay | Admitting: Physical Medicine and Rehabilitation

## 2022-04-07 NOTE — Telephone Encounter (Signed)
Pt called and state her injection was denied by insurance company and need to know what next steps are. Please call pt at [phone]. ?

## 2022-04-08 ENCOUNTER — Other Ambulatory Visit: Payer: Self-pay

## 2022-04-08 MED ORDER — LOSARTAN POTASSIUM 100 MG PO TABS: 100.0000 mg | ORAL_TABLET | Freq: Every day | ORAL | 1 refills | Status: DC

## 2022-04-08 MED ORDER — FUROSEMIDE 80 MG PO TABS: 160.0000 mg | ORAL_TABLET | Freq: Every day | ORAL | 2 refills | Status: DC

## 2022-04-09 NOTE — Progress Notes (Signed)
?Office Note  ? ? ? ?CC: Venous insufficiency ?Requesting Provider:  Allwardt, Randa Evens, PA-C ? ?HPI: EBONE ALCIVAR is a 86 y.o. (August 10, 1936) female who presents in follow-up with known bilateral extremity venous insufficiency. ? ?Since last seen 3 months ago, Sarabella has been doing well.  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  He is called mom [employer] her coworkers.  Brinleigh is been wearing compression stockings to the knees daily, and has had no issues with bilateral lower extremities.  She stated the heaviness and tired feeling has improved.  She denies ulcerations or bleeding events.  The varicosity appreciated on the left leg at her last visit has decreased in size. ? ? Patient had previous left-sided saphenous vein stripping, no pain medication.  No previous DVT ? ?Amarionna denied symptoms of claudication, ischemic rest pain, tissue loss. ? ?The pt  on a statin for cholesterol management.  ?The pt not on a daily aspirin.   Other AC:  plavix ?The pt is on medications for hypertension.   ?The pt is diabetic.   ?Tobacco hx:  none ? ?Past Medical History:  ?Diagnosis Date  ? Allergy   ? Arthritis   ? Bleeding of blood vessel   ? ext. genitalia area  ? Blood in stool   ? Chronic kidney disease   ? Depression   ? Glaucoma   ? Heart murmur   ? History of chicken pox   ? History of recurrent UTIs   ? History of stomach ulcers   ? Hyperlipidemia   ? Hypertension   ? Memory loss   ? Rheumatic fever   ? Seizures ([place])   ? 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 TIA (transient ischemic attack) 10/22/2013  ? Urine incontinence   ? ? ?Past Surgical History:  ?Procedure Laterality Date  ? ABDOMINAL HYSTERECTOMY    ? APPENDECTOMY  1983  ? CATARACT EXTRACTION Right May 13, 2014  ? EYE SURGERY  12/13/2018  ? stent inserted in left eye  ? gum transplant    ? RIGHT/LEFT HEART CATH AND CORONARY ANGIOGRAPHY N/A 05/12/2018  ? Procedure: RIGHT/LEFT HEART CATH AND CORONARY ANGIOGRAPHY;  Surgeon: Belva Crome, MD;   Location: [place] [facility];  Service: Cardiovascular;  Laterality: N/A;  ? THORACIC AORTOGRAM N/A 05/12/2018  ? Procedure: THORACIC AORTOGRAM;  Surgeon: Belva Crome, MD;  Location: [place] [facility];  Service: Cardiovascular;  Laterality: N/A;  ? TONSILLECTOMY    ? ? ?Social History  ? ?Socioeconomic History  ? Marital status: Divorced  ?  Spouse name: Not on file  ? Number of children: 2  ? Years of education: college  ? Highest education level: Not on file  ?Occupational History  ?  Employer: RETIRED  ?Tobacco Use  ? Smoking status: Former  ?  Years: 40.00  ?  Types: Cigarettes  ?  Quit date: 11/05/1971  ?  Years since quitting: 50.4  ? Smokeless tobacco: Never  ?Vaping Use  ? Vaping Use: Never used  ?Substance and Sexual Activity  ? Alcohol use: No  ?  Alcohol/week: 0.0 standard drinks  ? Drug use: No  ? Sexual activity: Not Currently  ?Other Topics Concern  ? Not on file  ?Social History Narrative  ? Patient lives at home alone  ? 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 Worked at [employer] for 18 years  ? Daughters in  [place] and in [place]  ? Caffeine Use: 2-3 cups daily  ? ?Social Determinants of Health  ? ?Financial Resource Strain: Low Risk   ? Difficulty of Paying Living Expenses: Not hard at all  ?Food Insecurity: No Food Insecurity  ? Worried About Charity fundraiser in the Last Year: Never true  ? Ran Out of Food in the Last Year: Never true  ?Transportation Needs: No Transportation Needs  ? Lack of Transportation (Medical): No  ? Lack of Transportation (Non-Medical): No  ?Physical Activity: Inactive  ? Days of Exercise per Week: 0 days  ? Minutes of Exercise per Session: 0 min  ?Stress: No Stress Concern Present  ? Feeling of Stress : Not at all  ?Social Connections: Moderately Isolated  ? Frequency of Communication with Friends and Family: More than three times a week  ? Frequency of Social Gatherings with Friends and Family: More than three times a week  ?  Attends Religious Services: 1 to 4 times per year  ? Active Member of Clubs or Organizations: No  ? Attends Archivist Meetings: Never  ? Marital Status: Divorced  ?Intimate Partner Violence: Not At Risk  ? Fear of Current or Ex-Partner: No  ? Emotionally Abused: No  ? Physically Abused: No  ? Sexually Abused: No  ? ? ?Family History  ?Problem Relation Age of Onset  ? Cancer Mother   ? Early death Mother   ? Heart disease Father   ? Arthritis Father   ? Early death Father   ? Hearing loss Father   ? Hypertension Father   ? Hyperlipidemia Father   ? Cancer Sister   ? Arthritis Sister   ? Arthritis Brother   ? Diabetes Brother   ? Heart attack Brother   ? Heart disease Brother   ? Hyperlipidemia Brother   ? Hypertension Brother   ? Diabetes Daughter   ? Heart disease Daughter   ? Hyperlipidemia Daughter   ? Hyperlipidemia Maternal Grandmother   ? Hypertension Maternal Grandmother   ? Stroke Maternal Grandmother   ? Alcohol abuse Maternal Grandfather   ? Early death Maternal Grandfather   ?     tuberculosis  ? Arthritis Paternal Grandmother   ? Alcohol abuse Paternal Grandfather   ? Heart disease Paternal Grandfather   ? Hyperlipidemia Paternal Grandfather   ? Hypertension Paternal Grandfather   ? Stroke Paternal Grandfather   ? Alcohol abuse Sister   ? COPD Sister   ? Drug abuse Sister   ? Early death Sister   ? Heart disease Sister   ? Hypertension Sister   ? Hyperlipidemia Sister   ? Early death Brother   ? Early death Brother   ? ? ?Current Outpatient Medications  ?Medication Sig Dispense Refill  ? acetaminophen (TYLENOL) 650 MG CR tablet Take 650 mg by mouth every 8 (eight) hours as needed for pain.    ? b complex vitamins capsule Take 1 capsule by mouth daily.    ? brimonidine (ALPHAGAN) 0.2 % ophthalmic solution Place 1 drop into both eyes 2 (two) times daily.    ? clopidogrel (PLAVIX) 75 MG tablet TAKE ONE TABLET BY MOUTH DAILY (Patient taking differently: Take 75 mg by mouth daily.) 90 tablet 3  ?  estradiol (ESTRACE) 0.1 MG/GM vaginal cream Place 0.5 g vaginally 2 (two) times a week. Place 0.5g twice a week 30 g 11  ? famciclovir (FAMVIR) 500 MG tablet Take 500 mg by mouth daily.     ?  famotidine (PEPCID) 20 MG tablet Take 1 tablet (20 mg total) by mouth 2 (two) times daily. 180 tablet 2  ? FARXIGA 5 MG TABS tablet TAKE ONE TABLET BY MOUTH DAILY BEFORE BREAKFAST 90 tablet 0  ? furosemide (LASIX) 80 MG tablet Take 2 tablets (160 mg total) by mouth daily. 180 tablet 2  ? glucose blood (CONTOUR NEXT TEST) test strip Use as instructed 100 each 12  ? inFLIXimab (REMICADE) 100 MG injection Inject 100 mg into the vein every 8 (eight) weeks.    ? Lancets MISC Check blood sugar once daily. Contour Next Device. 100 each 0  ? loratadine (CLARITIN) 10 MG tablet Take 10 mg by mouth daily.    ? losartan (COZAAR) 100 MG tablet Take 1 tablet (100 mg total) by mouth daily. 90 tablet 1  ? lovastatin (MEVACOR) 20 MG tablet TAKE ONE TABLET BY MOUTH EVERY EVENING 90 tablet 3  ? Multiple Vitamins-Minerals (CENTRUM SILVER 50+WOMEN PO) Take 1 tablet by mouth daily.    ? mycophenolate (CELLCEPT) 500 MG tablet Take 500 mg by mouth 2 (two) times daily.    ? potassium chloride SA (KLOR-CON M20) 20 MEQ tablet Take 1 tablet (20 mEq total) by mouth daily. 90 tablet 2  ? pramipexole (MIRAPEX) 0.25 MG tablet Take 1 tablet (0.25 mg total) by mouth at bedtime. 90 tablet 1  ? prednisoLONE acetate (PRED FORTE) 1 % ophthalmic suspension Place 1 drop into both eyes in the morning and at bedtime.    ? sertraline (ZOLOFT) 25 MG tablet TAKE ONE TABLET BY MOUTH DAILY 90 tablet 3  ? timolol (BETIMOL) 0.5 % ophthalmic solution Place 1 drop into the right eye 2 (two) times daily.    ? Vibegron 75 MG TABS Take 1 tablet by mouth daily. 30 tablet 11  ? ?No current facility-administered medications for this visit.  ? ? ?Allergies  ?Allergen Reactions  ? Dorzolamide Hcl-Timolol Mal Other (See Comments)  ?  Red eyes  ? Tetanus Toxoids Swelling  ?  Arm was  twice the size it should be  ? Hctz [Hydrochlorothiazide] Rash  ? ? ? ?REVIEW OF SYSTEMS:  ? ?'[X]'$  denotes positive finding, '[ ]'$  denotes negative finding ?Cardiac  Comments:  ?Chest pain or chest pressure:    ?Meta Hatchet

## 2022-04-10 ENCOUNTER — Ambulatory Visit (INDEPENDENT_AMBULATORY_CARE_PROVIDER_SITE_OTHER): Payer: Medicare HMO | Admitting: Vascular Surgery

## 2022-04-10 ENCOUNTER — Encounter: Payer: Self-pay | Admitting: Vascular Surgery

## 2022-04-10 VITALS — BP 132/76 | HR 65 | Temp 98.1°F | Resp 20 | Ht 62.0 in | Wt 161.0 lb

## 2022-04-10 DIAGNOSIS — I872 Venous insufficiency (chronic) (peripheral): Secondary | ICD-10-CM | POA: Diagnosis not present

## 2022-04-16 DIAGNOSIS — Z79899 Other long term (current) drug therapy: Secondary | ICD-10-CM | POA: Diagnosis not present

## 2022-04-16 DIAGNOSIS — M0600 Rheumatoid arthritis without rheumatoid factor, unspecified site: Secondary | ICD-10-CM | POA: Diagnosis not present

## 2022-05-05 DIAGNOSIS — M5432 Sciatica, left side: Secondary | ICD-10-CM | POA: Diagnosis not present

## 2022-05-05 DIAGNOSIS — Z79899 Other long term (current) drug therapy: Secondary | ICD-10-CM | POA: Diagnosis not present

## 2022-05-05 DIAGNOSIS — H209 Unspecified iridocyclitis: Secondary | ICD-10-CM | POA: Diagnosis not present

## 2022-05-05 DIAGNOSIS — M0600 Rheumatoid arthritis without rheumatoid factor, unspecified site: Secondary | ICD-10-CM | POA: Diagnosis not present

## 2022-05-05 DIAGNOSIS — Z6829 Body mass index (BMI) 29.0-29.9, adult: Secondary | ICD-10-CM | POA: Diagnosis not present

## 2022-05-05 DIAGNOSIS — E663 Overweight: Secondary | ICD-10-CM | POA: Diagnosis not present

## 2022-05-05 DIAGNOSIS — M1991 Primary osteoarthritis, unspecified site: Secondary | ICD-10-CM | POA: Diagnosis not present

## 2022-05-08 ENCOUNTER — Other Ambulatory Visit: Payer: Self-pay

## 2022-05-08 ENCOUNTER — Other Ambulatory Visit: Payer: Self-pay | Admitting: Physician Assistant

## 2022-05-11 NOTE — Progress Notes (Deleted)
Chronic Care Management Pharmacy Note  05/11/2022 Name:  Cheryl Wood MRN:  [id number] DOB:  [birth year]  Summary: Initial visit with PharmD.  Started [place] after most recent A1c.  Reported one FBG to me of 122.  Tolerating medication well, upcoming visit in December for recheck.  LDL elevated July 2021 - on low intensity statin.  Recommendations/Changes made from today's visit: Recheck A1c - consider 36m daily of Farxiga if elevated Recheck Lipids - consider moderate intensity statin if still elevated Also due for diabetic foot exam  Plan: FU 4 months   Subjective: Cheryl Wood an 86y.o. year old female who is a primary patient of Allwardt, [name] M, PA-C.  The CCM team was consulted for assistance with disease management and care coordination needs.    Engaged with patient by telephone for initial visit in response to provider referral for pharmacy case management and/or care coordination services.   Consent to Services:  The patient was given the following information about Chronic Care Management services today, agreed to services, and gave verbal consent: 1. CCM service includes personalized support from designated clinical staff supervised by the primary care provider, including individualized plan of care and coordination with other care providers 2. 24/7 contact phone numbers for assistance for urgent and routine care needs. 3. Service will only be billed when office clinical staff spend 20 minutes or more in a month to coordinate care. 4. Only one practitioner may furnish and bill the service in a calendar month. 5.The patient may stop CCM services at any time (effective at the end of the month) by phone call to the office staff. 6. The patient will be responsible for cost sharing (co-pay) of up to 20% of the service fee (after annual deductible is met). Patient agreed to services and consent obtained.  Patient Care Team: Allwardt, ARanda Evens PA-C as PCP - General  (Physician Assistant) [facility] [place] Pharmacist (Pharmacist)  Recent office visits:  12/31/2021 [facility] (PCP) Allwardt, ARanda Evens PA-C; Will Rx Augmentin at this time, take with food. Cautioned on antibiotic use and possible side effects. Advised nasal saline, humidifier, Mucinex, Robitussin, and pushing fluids. Call if worse or no improvement.    12/08/2021 [facility] (PCP) Allwardt, [name] M, PA-C; no medication changes indicated.   Recent consult visits:  01/09/2022 [facility] (Vasc Surg) RBroadus John MD; no medication changes indicated.   12/01/2021 [facility] (Ophthalmology) BEdilia Bo JTracie Harrier MD; no medication changes indicated.   11/26/2021 [facility] (Urogyn) SJaquita Folds MD; no medication changes indicated.   Hospital visits:  None in previous 6 months    Objective:  Lab Results  Component Value Date   CREATININE 1.03 12/08/2021   BUN 20 12/08/2021   GFR 50.24 (L) 12/08/2021   GFRNONAA 52 (L) 08/28/2021   GFRAA 56 (L) 10/03/2020   NA 143 12/08/2021   K 3.8 12/08/2021   CALCIUM 9.9 12/08/2021   CO2 29 12/08/2021   GLUCOSE 121 (H) 12/08/2021    Lab Results  Component Value Date/Time   HGBA1C 7.3 (H) 12/08/2021 09:44 AM   HGBA1C 8.1 (A) 08/15/2021 10:25 AM   HGBA1C 6.9 (H) 01/03/2021 08:44 AM   HGBA1C 7.0 11/29/2019 12:00 AM   GFR 50.24 (L) 12/08/2021 09:44 AM   GFR 48.23 (L) 04/03/2021 01:37 PM   MICROALBUR <0.7 07/03/2020 09:08 AM   MICROALBUR 0.9 01/25/2019 11:49 AM    Last diabetic Eye exam: No results found for: HMDIABEYEEXA  Last diabetic Foot exam: No  results found for: HMDIABFOOTEX   Lab Results  Component Value Date   CHOL 189 12/08/2021   HDL 52.50 12/08/2021   LDLCALC 102 (H) 12/08/2021   LDLDIRECT 136.0 01/25/2019   TRIG 169.0 (H) 12/08/2021   CHOLHDL 4 12/08/2021       Latest Ref Rng & Units 12/08/2021    9:44 AM 03/28/2021    9:50 AM 01/03/2021    8:44 AM  Hepatic Function  Total Protein 6.0 - 8.3 g/dL 6.9   7.1   7.3    Albumin 3.5 - 5.2 g/dL  4.4   4.3   4.6    AST 0 - 37 U/L 16   22   18     ALT 0 - 35 U/L 14   20   17     Alk Phosphatase 39 - 117 U/L 50   57   54    Total Bilirubin 0.2 - 1.2 mg/dL 0.8   1.5   1.0      Lab Results  Component Value Date/Time   TSH 1.82 11/29/2019 12:00 AM   TSH 1.81 10/13/2019 08:38 AM   TSH 1.67 09/29/2019 09:21 AM       Latest Ref Rng & Units 12/08/2021    9:44 AM 08/28/2021   10:11 AM 03/28/2021    9:50 AM  CBC  WBC 4.0 - 10.5 K/uL 5.9   8.8   6.3    Hemoglobin 12.0 - 15.0 g/dL 14.3   13.3   13.7    Hematocrit 36.0 - 46.0 % 43.2   39.7   41.5    Platelets 150.0 - 400.0 K/uL 172.0   124   162      No results found for: VD25OH  Clinical ASCVD: Yes  The ASCVD Risk score (Arnett DK, et al., 2019) failed to calculate for the following reasons:   The 2019 ASCVD risk score is only valid for ages 85 to 30   The patient has a prior MI or stroke diagnosis       03/16/2022    8:16 AM 07/02/2021    2:42 PM 04/03/2021   12:56 PM  Depression screen PHQ 2/9  Decreased Interest 0 0 0  Down, Depressed, Hopeless 0 0 0  PHQ - 2 Score 0 0 0  Altered sleeping  0   Tired, decreased energy  0   Change in appetite  0   Feeling bad or failure about yourself   0   Trouble concentrating  0   Moving slowly or fidgety/restless  0   Suicidal thoughts  0   PHQ-9 Score  0      Social History   Tobacco Use  Smoking Status Former   Years: 40.00   Types: Cigarettes   Quit date: 11/05/1971   Years since quitting: 50.5  Smokeless Tobacco Never   BP Readings from Last 3 Encounters:  04/10/22 132/76  03/19/22 132/79  03/16/22 138/78   Pulse Readings from Last 3 Encounters:  04/10/22 65  03/19/22 67  03/16/22 62   Wt Readings from Last 3 Encounters:  04/10/22 161 lb (73 kg)  03/16/22 164 lb 6.4 oz (74.6 kg)  01/09/22 162 lb (73.5 kg)   BMI Readings from Last 3 Encounters:  04/10/22 29.45 kg/m  03/16/22 30.07 kg/m  01/09/22 29.63 kg/m    Assessment/Interventions: Review of patient past  medical history, allergies, medications, health status, including review of consultants reports, laboratory and other test data, was performed as part of  comprehensive evaluation and provision of chronic care management services.   SDOH:  (Social Determinants of Health) assessments and interventions performed: Yes  Financial Resource Strain: Low Risk    Difficulty of Paying Living Expenses: Not hard at all    SDOH Screenings   Alcohol Screen: Not on file  Depression (PHQ2-9): Low Risk    PHQ-2 Score: 0  Financial Resource Strain: Low Risk    Difficulty of Paying Living Expenses: Not hard at all  Food Insecurity: No Food Insecurity   Worried About Charity fundraiser in the Last Year: Never true   Ran Out of Food in the Last Year: Never true  Housing: Low Risk    Last Housing Risk Score: 0  Physical Activity: Inactive   Days of Exercise per Week: 0 days   Minutes of Exercise per Session: 0 min  Social Connections: Moderately Isolated   Frequency of Communication with Friends and Family: More than three times a week   Frequency of Social Gatherings with Friends and Family: More than three times a week   Attends Religious Services: 1 to 4 times per year   Active Member of [employer] or Organizations: No   Attends Music therapist: Never   Marital Status: Divorced  Stress: No Stress Concern Present   Feeling of Stress : Not at all  Tobacco Use: Medium Risk   Smoking Tobacco Use: Former   Smokeless Tobacco Use: Never   Passive Exposure: Not on file  Transportation Needs: No Transportation Needs   Lack of Transportation (Medical): No   Lack of Transportation (Non-Medical): No    CCM Care Plan  Allergies  Allergen Reactions   Dorzolamide Hcl-Timolol Mal Other (See Comments)    Red eyes   Tetanus Toxoids Swelling    Arm was twice the size it should be   Hctz [Hydrochlorothiazide] Rash    Medications Reviewed Today     Reviewed by Leota Jacobsen, LPN  (Licensed Practical Nurse) on 04/10/22 at [place] List Status: <None>   Medication Order Taking? Sig Documenting Provider Last Dose Status Informant  acetaminophen (TYLENOL) 650 MG CR tablet [id number] Yes Take 650 mg by mouth every 8 (eight) hours as needed for pain. [provider] Taking Active Multiple Informants  b complex vitamins capsule [id number] Yes Take 1 capsule by mouth daily. [provider] Taking Active   brimonidine (ALPHAGAN) 0.2 % ophthalmic solution [id number] Yes Place 1 drop into both eyes 2 (two) times daily. [provider] Taking Active Multiple Informants  clopidogrel (PLAVIX) 75 MG tablet [id number] Yes TAKE ONE TABLET BY MOUTH DAILY  Patient taking differently: Take 75 mg by mouth daily.   Allwardt, Randa Evens, PA-C Taking Active   estradiol (ESTRACE) 0.1 MG/GM vaginal cream [id number] Yes Place 0.5 g vaginally 2 (two) times a week. Place 0.5g twice a week Jaquita Folds, MD Taking Active Multiple Informants  famciclovir [facility]) 500 MG tablet [id number] Yes Take 500 mg by mouth daily.  [provider] Taking Active Multiple Informants  famotidine (PEPCID) 20 MG tablet [id number] Yes Take 1 tablet (20 mg total) by mouth 2 (two) times daily. Allwardt, Randa Evens, PA-C Taking Active   FARXIGA 5 MG TABS tablet [id number] Yes TAKE ONE TABLET BY MOUTH DAILY BEFORE BREAKFAST Allwardt, [name] M, PA-C Taking Active   furosemide (LASIX) 80 MG tablet [id number] Yes Take 2 tablets (160 mg total) by mouth daily. Allwardt, Randa Evens, PA-C Taking Active   glucose blood (CONTOUR NEXT  TEST) test strip [id number] Yes Use as instructed Allwardt, Randa Evens, PA-C Taking Active Multiple Informants  inFLIXimab (REMICADE) 100 MG injection [id number] Yes Inject 100 mg into the vein every 8 (eight) weeks. [provider] Taking Active Multiple Informants  Lancets MISC [id number] Yes Check blood sugar once daily. Contour Next Device. Allwardt, Randa Evens, PA-C  Taking Active Multiple Informants  loratadine (CLARITIN) 10 MG tablet [id number] Yes Take 10 mg by mouth daily. [provider] Taking Active Multiple Informants  losartan (COZAAR) 100 MG tablet [id number] Yes Take 1 tablet (100 mg total) by mouth daily. Allwardt, Randa Evens, PA-C Taking Active   lovastatin (MEVACOR) 20 MG tablet [id number] Yes TAKE ONE TABLET BY MOUTH EVERY EVENING Allwardt, [name] M, PA-C Taking Active   Multiple Vitamins-Minerals (CENTRUM SILVER 50+WOMEN PO) [id number] Yes Take 1 tablet by mouth daily. [provider] Taking Active Multiple Informants  mycophenolate (CELLCEPT) 500 MG tablet [id number] Yes Take 500 mg by mouth 2 (two) times daily. [provider] Taking Active   potassium chloride SA (KLOR-CON M20) 20 MEQ tablet [id number] Yes Take 1 tablet (20 mEq total) by mouth daily. Allwardt, Randa Evens, PA-C Taking Active   pramipexole (MIRAPEX) 0.25 MG tablet [id number] Yes Take 1 tablet (0.25 mg total) by mouth at bedtime. Allwardt, Randa Evens, PA-C Taking Active   prednisoLONE acetate (PRED FORTE) 1 % ophthalmic suspension [id number] Yes Place 1 drop into both eyes in the morning and at bedtime. [provider] Taking Active Multiple Informants           Med Note Dorina Hoyer, [facility] P   Fri Mar 28, 2021 11:22 AM)    sertraline (ZOLOFT) 25 MG tablet [id number] Yes TAKE ONE TABLET BY MOUTH DAILY Allwardt, [name] M, PA-C Taking Active   timolol (BETIMOL) 0.5 % ophthalmic solution [id number] Yes Place 1 drop into the right eye 2 (two) times daily. [provider] Taking Active Multiple Informants  Vibegron 75 MG TABS [id number] Yes Take 1 tablet by mouth daily. Jaquita Folds, MD Taking Active Pharmacy Records            Patient Active Problem List   Diagnosis Date Noted   Bilateral lower extremity edema 10/22/2021   Lumbar radiculopathy 07/15/2021   Rheumatoid arthritis ([place]) 01/03/2021   Depression, major, single episode, mild ([place])  09/29/2019   Polyarthralgia 04/21/2019   Diabetes mellitus without complication ([place]) 27/51/7001   CVA (cerebral vascular accident) ([place]) 01/25/2019   RLS (restless legs syndrome) 01/25/2019   Glaucoma, left eye 01/25/2019   Glaucoma associated with ocular inflammation, left, 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   Nonrheumatic aortic valve insufficiency    CAD in native artery    Dyspnea on exertion 03/30/2018   TIA (transient ischemic attack) 10/22/2013   HTN (hypertension) 10/22/2013   Hyperlipidemia 10/22/2013    Immunization History  Administered Date(s) Administered   Fluad Quad(high Dose 65+) 09/29/2019, 10/03/2020   Influenza Split 08/30/2017   Influenza-Unspecified 09/28/2018, 10/16/2021   Moderna SARS-COV2 Booster Vaccination 01/31/2021, 10/16/2021   Moderna Sars-Covid-2 Vaccination 02/09/2020, 03/08/2020, 08/27/2020   Pneumococcal Conjugate-13 03/29/2015   Pneumococcal Polysaccharide-23 11/01/2019    Conditions to be addressed/monitored:  HTN, CAD, DM, HLD, RLS, Depression  There are no care plans that you recently modified to display for this patient.     Medication Assistance: None required.  Patient affirms current coverage meets needs.  Compliance/Adherence/Medication fill history: Care Gaps: Needs diabetic foot exam  Star-Rating Drugs: Lovastatin 10/06/21 90ds  Patient's preferred pharmacy is:  [facility] [id number], [place] [address] Phone: [phone] Fax: [phone]  [id number] - [place], [place] [address] [place] [address] Phone: [phone] Fax: [phone]   Uses pill box? Yes Pt endorses 100% compliance  We discussed: Benefits of medication synchronization, packaging and delivery as well as enhanced pharmacist oversight with [facility]. Patient decided to: Continue current medication management strategy  Care Plan and  Follow Up Patient Decision:  Patient agrees to Care Plan and Follow-up.  Plan: The care management team will reach out to the patient again over the next 120 days.  Beverly Milch, PharmD Clinical Pharmacist  [facility] [phone]      Current Barriers:  Unable to achieve control of glucose and lipids   Pharmacist Clinical Goal(s):  Patient will achieve control of glucose and lipids as evidenced by labs through collaboration with PharmD and provider.   Interventions: 1:1 collaboration with Allwardt, Randa Evens, PA-C regarding development and update of comprehensive plan of care as evidenced by provider attestation and co-signature Inter-disciplinary care team collaboration (see longitudinal plan of care) Comprehensive medication review performed; medication list updated in electronic medical record  Hypertension (BP goal <130/80) -Controlled -Current treatment: Losartan 100mg  daily -Medications previously tried: amlodipine  -Current home readings: 120s/70s -Current dietary habits: see DM -Current exercise habits: not much lately due to sciatica pain -Denies hypotensive/hypertensive symptoms -Educated on BP goals and benefits of medications for prevention of heart attack, stroke and kidney damage; Daily salt intake goal < 2300 mg; Exercise goal of 150 minutes per week; Importance of home blood pressure monitoring; -Counseled to monitor BP at home as current, document, and provide log at future appointments -Reports adherence with BP med, does spike sometimes if she waits until later in the day to take her medication.  Always comes back down to normal. -Need to increase physical activity once pain improves -Recommended to continue current medication  Hyperlipidemia: (LDL goal < 70) -Not ideally controlled -Current treatment: Lovastatin 20mg  daily -Medications previously tried: none noted  -Current dietary patterns: see DM -Current exercise habits:  minimal -Educated on Cholesterol goals;  Benefits of statin for ASCVD risk reduction; Importance of limiting foods high in cholesterol; -LDL was elevated at last check.  Currently on low intensity statin. -Pt with DM consider at least moderate intensity. -Recommended to continue current medication Recheck lipid panel at [facility] in December, if remains elevated could consider increase to 40mg  or other mod intensity statin.  Diabetes (A1c goal <7%) -Uncontrolled -Current medications: Farxiga 5mg  daily - started in August -Medications previously tried: none noted  -Current home glucose readings fasting glucose: 122, checks occasionally post prandial glucose: N/A -Denies hypoglycemic/hyperglycemic symptoms -Current meal patterns:  breakfast: fruit, cottage cheese, coffee  lunch: BLT sandwich  dinner: soups, smart ones meals snacks:  drinks: sprite zero, water -Current exercise: minimal due to pain -Educated on A1c and blood sugar goals; Benefits of routine self-monitoring of blood sugar; -Counseled to check feet daily and get yearly eye exams -Diet fairly low in sugars/ excess carbs -Recommended to continue current medication Reckeck A1c at upcoming visit, hopefully A1c improved.  If not could consider dose increase to 10mg  daily.  Depression/Anxiety (Goal: Minimize symptoms) -Controlled -Current treatment: Sertraline 25mg  daily -Medications previously tried/failed: Lorazepam -PHQ9:  PHQ9 SCORE ONLY 07/02/2021 04/03/2021 03/10/2021  PHQ-9 Total Score 0 0 0    -Educated on Benefits of medication for symptom  control -Recommended to continue current medication Reports sleep is good, no complaints of depressive symptoms.  Patient Goals/Self-Care Activities Patient will:  - take medications as prescribed as evidenced by patient report and record review check glucose a few times per week, document, and provide at future appointments target a minimum of 150 minutes of moderate intensity  exercise weekly  Follow Up Plan: The care management team will reach out to the patient again over the next 120 days.

## 2022-05-20 DIAGNOSIS — H4042X2 Glaucoma secondary to eye inflammation, left eye, moderate stage: Secondary | ICD-10-CM | POA: Diagnosis not present

## 2022-05-20 DIAGNOSIS — H4041X1 Glaucoma secondary to eye inflammation, right eye, mild stage: Secondary | ICD-10-CM | POA: Diagnosis not present

## 2022-05-25 ENCOUNTER — Telehealth: Payer: Medicare HMO

## 2022-05-29 ENCOUNTER — Encounter: Payer: Self-pay | Admitting: Obstetrics and Gynecology

## 2022-05-29 ENCOUNTER — Ambulatory Visit (INDEPENDENT_AMBULATORY_CARE_PROVIDER_SITE_OTHER): Payer: Medicare HMO | Admitting: Obstetrics and Gynecology

## 2022-05-29 VITALS — BP 110/69 | HR 68

## 2022-05-29 DIAGNOSIS — N393 Stress incontinence (female) (male): Secondary | ICD-10-CM | POA: Diagnosis not present

## 2022-05-29 DIAGNOSIS — N3281 Overactive bladder: Secondary | ICD-10-CM

## 2022-05-29 NOTE — Progress Notes (Signed)
[facility]   Subjective:     Chief Complaint:  Chief Complaint  Patient presents with   Cheryl Wood is a 86 y.o. female here for a pessary check     History of Present Illness: Cheryl Wood is a 86 y.o. female with stress incontinence and OAB who presents for a pessary check. She is using a size #0 incontinence ring pessary. The pessary has been working well. Using the vaginal estrogen cream.   On Gemtesa '75mg'$  for overactive bladder. Not having any bladder leakage.   Past Medical History: Patient  has a past medical history of Allergy, Arthritis, Bleeding of blood vessel, Blood in stool, Chronic kidney disease, Depression, Glaucoma, Heart murmur, History of chicken pox, History of recurrent UTIs, History of stomach ulcers, Hyperlipidemia, Hypertension, Memory loss, Rheumatic fever, Seizures (Cheryl Wood), Stroke (Cheryl Wood), TIA (transient ischemic attack) (10/22/2013), and Urine incontinence.   Past Surgical History: She  has a past surgical history that includes Abdominal hysterectomy; gum transplant; Cataract extraction (Right, May 13, 2014); RIGHT/LEFT HEART CATH AND CORONARY ANGIOGRAPHY (N/A, 05/12/2018); THORACIC AORTOGRAM (N/A, 05/12/2018); Eye surgery (12/13/2018); Appendectomy (1983); and Tonsillectomy.   Medications: She has a current medication list which includes the following prescription(s): acetaminophen, b complex vitamins, brimonidine, clopidogrel, estradiol, famciclovir, famotidine, farxiga, furosemide, contour next test, remicade, lancets, loratadine, losartan, lovastatin, multiple vitamins-minerals, mycophenolate, potassium chloride sa, pramipexole, prednisolone acetate, sertraline, timolol, and vibegron.   Allergies: Patient is allergic to dorzolamide hcl-timolol mal, tetanus toxoids, and hctz [hydrochlorothiazide].   Social History: Patient  reports that she quit smoking about 50 years ago. Her smoking use included cigarettes. She has never  used smokeless tobacco. She reports that she does not drink alcohol and does not use drugs.      Objective:    Physical Exam: BP 110/69   Pulse 68   LMP  (LMP Unknown)  Gen: No apparent distress, A&O x 3.  Detailed Urogynecologic Evaluation:  Pelvic Exam: Normal external female genitalia; Bartholin's and Skene's glands normal in appearance; urethral meatus normal in appearance, no urethral masses or discharge. The pessary was noted to be in place. It was removed and cleaned. Speculum exam revealed no lesions in the vagina. The pessary was replaced. It was comfortable to the patient and fit well.   POP-Q (03/26/21):    POP-Q   -2                                            Aa   -2                                           Ba   -6                                              C    1.5                                            Gh   3  Pb   6.5                                            tvl    -1.5                                            Ap   -1.5                                            Bp                                                  D         Assessment/Plan:    Assessment: [name] is a 86 y.o. with stress incontinence and OAB here for a pessary check.   Plan:  SUI - Continue #0 incontinence ring pessary.  - Continue estrogen cream twice a week.   OAB - Continue with Gemtesa '75mg'$  daily.   Follow up 3 months.   Time spent: I spent 20 minutes dedicated to the care of this patient on the date of this encounter to include pre-visit review of records, face-to-face time with the patient and post visit documentation.

## 2022-06-01 NOTE — Progress Notes (Signed)
Chronic Care Management Pharmacy Note  06/08/2022 Name:  Cheryl Wood MRN:  [id number] DOB:  03-01-1936  Summary: Patient doing well on Farxiga.  She is feeling well overall.  Reports fasting sugars around 120s.  Recommendations/Changes made from today's visit: No changes to meds She is due for FOOT EXAM, EYE EXAM, and A1c recheck.  Plan: FU 4 months   Subjective: Cheryl Wood is an 86 y.o. year old female who is a primary patient of Allwardt, [name] M, PA-C.  The CCM team was consulted for assistance with disease management and care coordination needs.    Engaged with patient by telephone for follow up visit in response to provider referral for pharmacy case management and/or care coordination services.   Consent to Services:  The patient was given the following information about Chronic Care Management services today, agreed to services, and gave verbal consent: 1. CCM service includes personalized support from designated clinical staff supervised by the primary care provider, including individualized plan of care and coordination with other care providers 2. 24/7 contact phone numbers for assistance for urgent and routine care needs. 3. Service will only be billed when office clinical staff spend 20 minutes or more in a month to coordinate care. 4. Only one practitioner may furnish and bill the service in a calendar month. 5.The patient may stop CCM services at any time (effective at the end of the month) by phone call to the office staff. 6. The patient will be responsible for cost sharing (co-pay) of up to 20% of the service fee (after annual deductible is met). Patient agreed to services and consent obtained.  Patient Care Team: Allwardt, Randa Evens, PA-C as PCP - General (Physician Assistant) Edythe Clarity, [facility] as Pharmacist (Pharmacist)  Recent office visits:  12/31/2021 [facility] (PCP) Allwardt, Randa Evens, PA-C; Will Rx Augmentin at this time, take with food. Cautioned on  antibiotic use and possible side effects. Advised nasal saline, humidifier, Mucinex, Robitussin, and pushing fluids. Call if worse or no improvement.    12/08/2021 [facility] (PCP) Allwardt, [name] M, PA-C; no medication changes indicated.   Recent consult visits:  01/09/2022 [facility] (Vasc Surg) Broadus John, MD; no medication changes indicated.   12/01/2021 [facility] (Ophthalmology) Edilia Bo, Tracie Harrier, MD; no medication changes indicated.   11/26/2021 [facility] (Urogyn) Jaquita Folds, MD; no medication changes indicated.   Hospital visits:  None in previous 6 months   Objective:  Lab Results  Component Value Date   CREATININE 1.03 12/08/2021   BUN 20 12/08/2021   GFR 50.24 (L) 12/08/2021   GFRNONAA 52 (L) 08/28/2021   GFRAA 56 (L) 10/03/2020   NA 143 12/08/2021   K 3.8 12/08/2021   CALCIUM 9.9 12/08/2021   CO2 29 12/08/2021   GLUCOSE 121 (H) 12/08/2021    Lab Results  Component Value Date/Time   HGBA1C 7.3 (H) 12/08/2021 09:44 AM   HGBA1C 8.1 (A) 08/15/2021 10:25 AM   HGBA1C 6.9 (H) 01/03/2021 08:44 AM   HGBA1C 7.0 11/29/2019 12:00 AM   GFR 50.24 (L) 12/08/2021 09:44 AM   GFR 48.23 (L) 04/03/2021 01:37 PM   MICROALBUR <0.7 07/03/2020 09:08 AM   MICROALBUR 0.9 01/25/2019 11:49 AM    Last diabetic Eye exam: No results found for: "HMDIABEYEEXA"  Last diabetic Foot exam: No results found for: "HMDIABFOOTEX"   Lab Results  Component Value Date   CHOL 189 12/08/2021   HDL 52.50 12/08/2021   LDLCALC 102 (H) 12/08/2021   LDLDIRECT 136.0 01/25/2019  TRIG 169.0 (H) 12/08/2021   CHOLHDL 4 12/08/2021       Latest Ref Rng & Units 12/08/2021    9:44 AM 03/28/2021    9:50 AM 01/03/2021    8:44 AM  Hepatic Function  Total Protein 6.0 - 8.3 g/dL 6.9  7.1  7.3   Albumin 3.5 - 5.2 g/dL 4.4  4.3  4.6   AST 0 - 37 U/L 16  22  18    ALT 0 - 35 U/L 14  20  17    Alk Phosphatase 39 - 117 U/L 50  57  54   Total Bilirubin 0.2 - 1.2 mg/dL 0.8  1.5  1.0     Lab Results  Component Value  Date/Time   TSH 1.82 11/29/2019 12:00 AM   TSH 1.81 10/13/2019 08:38 AM   TSH 1.67 09/29/2019 09:21 AM       Latest Ref Rng & Units 12/08/2021    9:44 AM 08/28/2021   10:11 AM 03/28/2021    9:50 AM  CBC  WBC 4.0 - 10.5 K/uL 5.9  8.8  6.3   Hemoglobin 12.0 - 15.0 g/dL 14.3  13.3  13.7   Hematocrit 36.0 - 46.0 % 43.2  39.7  41.5   Platelets 150.0 - 400.0 K/uL 172.0  124  162     No results found for: "VD25OH"  Clinical ASCVD: Yes  The ASCVD Risk score (Arnett DK, et al., 2019) failed to calculate for the following reasons:   The 2019 ASCVD risk score is only valid for ages 73 to 76   The patient has a prior MI or stroke diagnosis       03/16/2022    8:16 AM 07/02/2021    2:42 PM 04/03/2021   12:56 PM  Depression screen PHQ 2/9  Decreased Interest 0 0 0  Down, Depressed, Hopeless 0 0 0  PHQ - 2 Score 0 0 0  Altered sleeping  0   Tired, decreased energy  0   Change in appetite  0   Feeling bad or failure about yourself   0   Trouble concentrating  0   Moving slowly or fidgety/restless  0   Suicidal thoughts  0   PHQ-9 Score  0      Social History   Tobacco Use  Smoking Status Former   Years: 40.00   Types: Cigarettes   Quit date: 11/05/1971   Years since quitting: 50.6  Smokeless Tobacco Never   BP Readings from Last 3 Encounters:  05/29/22 110/69  04/10/22 132/76  03/19/22 132/79   Pulse Readings from Last 3 Encounters:  05/29/22 68  04/10/22 65  03/19/22 67   Wt Readings from Last 3 Encounters:  04/10/22 161 lb (73 kg)  03/16/22 164 lb 6.4 oz (74.6 kg)  01/09/22 162 lb (73.5 kg)   BMI Readings from Last 3 Encounters:  04/10/22 29.45 kg/m  03/16/22 30.07 kg/m  01/09/22 29.63 kg/m    Assessment/Interventions: Review of patient past medical history, allergies, medications, health status, including review of consultants reports, laboratory and other test data, was performed as part of comprehensive evaluation and provision of chronic care management  services.   SDOH:  (Social Determinants of Health) assessments and interventions performed: Yes  Financial Resource Strain: Low Risk  (03/16/2022)   Overall Financial Resource Strain (CARDIA)    Difficulty of Paying Living Expenses: Not hard at all    SDOH Screenings   Alcohol Screen: Not on file  Depression (PHQ2-9): Low  Risk  (03/16/2022)   Depression (PHQ2-9)    PHQ-2 Score: 0  Financial Resource Strain: Low Risk  (03/16/2022)   Overall Financial Resource Strain (CARDIA)    Difficulty of Paying Living Expenses: Not hard at all  Food Insecurity: No Food Insecurity (03/16/2022)   Hunger Vital Sign    Worried About Running Out of Food in the Last Year: Never true    Ran Out of Food in the Last Year: Never true  Housing: Low Risk  (03/16/2022)   Housing    Last Housing Risk Score: 0  Physical Activity: Inactive (03/16/2022)   Exercise Vital Sign    Days of Exercise per Week: 0 days    Minutes of Exercise per Session: 0 min  Social Connections: Moderately Isolated (03/16/2022)   Social Connection and Isolation Panel [NHANES]    Frequency of Communication with Friends and Family: More than three times a week    Frequency of Social Gatherings with Friends and Family: More than three times a week    Attends Religious Services: 1 to 4 times per year    Active Member of [employer] or Organizations: No    Attends Archivist Meetings: Never    Marital Status: Divorced  Stress: No Stress Concern Present (03/16/2022)   [place]    Feeling of Stress : Not at all  Tobacco Use: Medium Risk (05/29/2022)   Patient History    Smoking Tobacco Use: Former    Smokeless Tobacco Use: Never    Passive Exposure: Not on file  Transportation Needs: No Transportation Needs (03/16/2022)   PRAPARE - Hydrologist (Medical): No    Lack of Transportation (Non-Medical): No    CCM Care Plan  Allergies   Allergen Reactions   Dorzolamide Hcl-Timolol Mal Other (See Comments)    Red eyes   Tetanus Toxoids Swelling    Arm was twice the size it should be   Hctz [Hydrochlorothiazide] Rash    Medications Reviewed Today     Reviewed by Edythe Clarity, [facility] (Pharmacist) on 06/08/22 at 1515  Med List Status: <None>   Medication Order Taking? Sig Documenting Provider Last Dose Status Informant  acetaminophen (TYLENOL) 650 MG CR tablet [id number] Yes Take 650 mg by mouth every 8 (eight) hours as needed for pain. [provider] Taking Active Multiple Informants  b complex vitamins capsule [id number] Yes Take 1 capsule by mouth daily. [provider] Taking Active   brimonidine (ALPHAGAN) 0.2 % ophthalmic solution [id number] Yes Place 1 drop into both eyes 2 (two) times daily. [provider] Taking Active Multiple Informants  clopidogrel (PLAVIX) 75 MG tablet [id number] Yes TAKE ONE TABLET BY MOUTH DAILY  Patient taking differently: Take 75 mg by mouth daily.   Allwardt, Randa Evens, PA-C Taking Active   estradiol (ESTRACE) 0.1 MG/GM vaginal cream [id number] Yes Place 0.5 g vaginally 2 (two) times a week. Place 0.5g twice a week Jaquita Folds, MD Taking Active Multiple Informants  famciclovir [facility]) 500 MG tablet [id number] Yes Take 500 mg by mouth daily.  [provider] Taking Active Multiple Informants  famotidine (PEPCID) 20 MG tablet [id number] Yes Take 1 tablet (20 mg total) by mouth 2 (two) times daily. Allwardt, Randa Evens, PA-C Taking Active   FARXIGA 5 MG TABS tablet [id number] Yes TAKE 1 TABLET BY MOUTH DAILY BEFORE BREAKFAST Allwardt, [name] M, PA-C Taking Active   furosemide (LASIX) 80 MG tablet [id number]  Yes Take 2 tablets (160 mg total) by mouth daily. Allwardt, Randa Evens, PA-C Taking Active   glucose blood (CONTOUR NEXT TEST) test strip [id number] Yes Use as instructed Allwardt, Randa Evens, PA-C Taking Active Multiple Informants  inFLIXimab (REMICADE)  100 MG injection [id number] Yes Inject 100 mg into the vein every 8 (eight) weeks. [provider] Taking Active Multiple Informants  Lancets MISC [id number] Yes Check blood sugar once daily. Contour Next Device. Allwardt, Randa Evens, PA-C Taking Active Multiple Informants  loratadine (CLARITIN) 10 MG tablet [id number] Yes Take 10 mg by mouth daily. [provider] Taking Active Multiple Informants  losartan (COZAAR) 100 MG tablet [id number] Yes Take 1 tablet (100 mg total) by mouth daily. Allwardt, Randa Evens, PA-C Taking Active   lovastatin (MEVACOR) 20 MG tablet [id number] Yes TAKE ONE TABLET BY MOUTH EVERY EVENING Allwardt, [name] M, PA-C Taking Active   Multiple Vitamins-Minerals (CENTRUM SILVER 50+WOMEN PO) [id number] Yes Take 1 tablet by mouth daily. [provider] Taking Active Multiple Informants  mycophenolate (CELLCEPT) 500 MG tablet [id number] Yes Take 500 mg by mouth 2 (two) times daily. [provider] Taking Active   potassium chloride SA (KLOR-CON M20) 20 MEQ tablet [id number] Yes Take 1 tablet (20 mEq total) by mouth daily. Allwardt, Randa Evens, PA-C Taking Active   pramipexole (MIRAPEX) 0.25 MG tablet [id number] Yes Take 1 tablet (0.25 mg total) by mouth at bedtime. Allwardt, Randa Evens, PA-C Taking Active   prednisoLONE acetate (PRED FORTE) 1 % ophthalmic suspension [id number] Yes Place 1 drop into both eyes in the morning and at bedtime. [provider] Taking Active Multiple Informants           Med Note Dorina Hoyer, [facility]   Fri Mar 28, 2021 11:22 AM)    sertraline (ZOLOFT) 25 MG tablet [id number] Yes TAKE ONE TABLET BY MOUTH DAILY Allwardt, [name] M, PA-C Taking Active   timolol (BETIMOL) 0.5 % ophthalmic solution [id number] Yes Place 1 drop into the right eye 2 (two) times daily. [provider] Taking Active Multiple Informants  Vibegron 75 MG TABS [id number] Yes Take 1 tablet by mouth daily. 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   Nonrheumatic aortic valve insufficiency    CAD in native artery    Dyspnea on exertion 03/30/2018   TIA (transient ischemic attack) 10/22/2013   HTN (hypertension) 10/22/2013   Hyperlipidemia 10/22/2013    Immunization History  Administered Date(s) Administered   Fluad Quad(high Dose 65+) 09/29/2019, 10/03/2020   Influenza Split 08/30/2017   Influenza-Unspecified 09/28/2018, 10/16/2021   Moderna SARS-COV2 Booster Vaccination 01/31/2021, 10/16/2021   Moderna Sars-Covid-2 Vaccination 02/09/2020, 03/08/2020, 08/27/2020   Pneumococcal Conjugate-13 03/29/2015   Pneumococcal Polysaccharide-23 11/01/2019    Conditions to be addressed/monitored:  HTN, CAD, DM, HLD, RLS, Depression  Care Plan : General Pharmacy (Adult)  Updates made by Edythe Clarity, RPH since 06/08/2022 12:00 AM     Problem: HTN, CAD, DM, HLD, RLS, Depression  Priority: High  Onset Date: 11/11/2021     Goal: Patient-Specific Goal   Note:   Current Barriers:  Unable to achieve control of glucose and lipids   Pharmacist Clinical Goal(s):  Patient will achieve control of glucose and lipids as evidenced by labs through collaboration with PharmD and provider.   Interventions: 1:1 collaboration with Allwardt, Randa Evens, PA-C regarding development and update of comprehensive plan of care as evidenced by provider attestation and co-signature Inter-disciplinary care team  collaboration (see longitudinal plan of care) Comprehensive medication review performed; medication list updated in electronic medical record  Hypertension (BP goal <130/80) -Controlled -Current treatment: Losartan 137m daily -Medications previously tried: amlodipine  -Current home readings: 120s/70s -Current dietary habits: see DM -Current exercise habits: not much lately due to sciatica pain -Denies hypotensive/hypertensive symptoms -Educated on BP goals and benefits of medications for prevention of heart attack, stroke and kidney damage; Daily salt intake goal < 2300 mg; Exercise goal of 150 minutes per week; Importance of home blood pressure monitoring; -Counseled to monitor BP at home as current, document, and provide log at future appointments -Reports adherence with BP med, does spike sometimes if she waits until later in the day to take her medication.  Always comes back down to normal. -Need to increase physical activity once pain improves -Recommended to continue current medication  Hyperlipidemia: (LDL goal < 70) -Not ideally controlled -Current treatment: Lovastatin 229mdaily Appropriate, Query effective, -Medications previously tried: none noted  -Current dietary patterns: see DM -Current exercise habits: minimal -Educated on Cholesterol goals;  Benefits of statin for ASCVD risk reduction; Importance of limiting foods high in cholesterol; -LDL was elevated at last check.  Currently on low intensity statin. -Pt with DM consider at least moderate intensity. -Recommended to continue current medication Recheck lipid panel at [facility] in December, if remains elevated could consider increase to 4029mr other mod intensity statin.  Update 06/08/22 Continues on same dose of Lovastatin. Lipid panel improved but LDL was slightly above goal 100. Encouraged her to continue adherence, continue to focus on healthy eating habits. No changes to medication at this time - room for dose  titration to 66m67m lovastatin if needed. FU at next annual labs.  Diabetes (A1c goal <7%) -Uncontrolled -Current medications: Farxiga 5mg 25mly - started in August Appropriate, Effective, Safe, Accessible -Medications previously tried: none noted  -Current home glucose readings fasting glucose: 122, checks occasionally post prandial glucose: N/A -Denies hypoglycemic/hyperglycemic symptoms -Current meal patterns:  breakfast: fruit, cottage cheese, coffee  lunch: BLT sandwich  dinner: soups, smart ones meals snacks:  drinks: sprite zero, water -Current exercise: minimal due to pain -Educated on A1c and blood sugar goals; Benefits of routine self-monitoring of blood sugar; -Counseled to check feet daily and get yearly eye exams -Diet fairly low in sugars/ excess carbs -Recommended to continue current medication Reckeck A1c at upcoming visit, hopefully A1c improved.  If not could consider dose increase to 10mg 59my.   Update 06/08/22 A1c improved to 7.3 after additional of Farxiga. No changes were made.  She continues to feel very good.  [place] call. Not checking glucose very much but when she did check last fasting her readings were in the 120s. Continue as directed.  Could consider dose increase to 10mg i76me future but I am happy where her A1c is currently. Continue to work on diet/exercise. She is due for FOOT EXAM, EYE EXAM, and A1c  recheck.  Depression/Anxiety (Goal: Minimize symptoms) -Controlled -Current treatment: Sertraline 64m daily -Medications previously tried/failed: Lorazepam -PHQ9:  PHQ9 SCORE ONLY 07/02/2021 04/03/2021 03/10/2021  PHQ-9 Total Score 0 0 0    -Educated on Benefits of medication for symptom control -Recommended to continue current medication Reports sleep is good, no complaints of depressive symptoms.  Patient Goals/Self-Care Activities Patient will:  - take medications as prescribed  as evidenced by patient report and record review check glucose a few times per week, document, and provide at future appointments target a minimum of 150 minutes of moderate intensity exercise weekly  Follow Up Plan: The care management team will reach out to the patient again over the next 120 days.            Medication Assistance: None required.  Patient affirms current coverage meets needs.  Compliance/Adherence/Medication fill history: Care Gaps: Needs diabetic foot exam  Star-Rating Drugs: Lovastatin 10/06/21 90ds  [facility]  [id number] [id number] [address]Phone: [phone]: [phone] [id number]- [place], [place]SP[place]Phone: [phone]: [phone]  Uses pill box? Yes Pt endorses 100% compliance  We discussed: Benefits of medication synchronization, packaging and delivery as well as enhanced pharmacist oversight with [employer]. Patient decided to: Continue current medication management strategy  Care Plan and Follow Up Patient Decision:  Patient agrees to Care Plan and Follow-up.  Plan: The care management team will reach out to the patient again over the next 120 days.  CBeverly Milch PharmD Clinical Pharmacist  [phone]

## 2022-06-08 ENCOUNTER — Ambulatory Visit: Payer: Medicare HMO | Admitting: Pharmacist

## 2022-06-08 DIAGNOSIS — E782 Mixed hyperlipidemia: Secondary | ICD-10-CM

## 2022-06-08 DIAGNOSIS — E1165 Type 2 diabetes mellitus with hyperglycemia: Secondary | ICD-10-CM

## 2022-06-08 NOTE — Patient Instructions (Addendum)
Visit Information   Goals Addressed             This Visit's Progress    Set My Target A1C-Diabetes Type 2   On track    Timeframe:  Long-Range Goal Priority:  High Start Date:   11/11/21                          Expected End Date: 05/11/22                     Follow Up Date 02/11/22    - set target A1C < 7    Why is this important?   Your target A1C is decided together by you and your doctor.  It is based on several things like your age and other health issues.    Notes:        Patient Care Plan: General Pharmacy (Adult)     Problem Identified: HTN, CAD, DM, HLD, RLS, Depression   Priority: High  Onset Date: 11/11/2021     Goal: Patient-Specific Goal   Note:   Current Barriers:  Unable to achieve control of glucose and lipids   Pharmacist Clinical Goal(s):  Patient will achieve control of glucose and lipids as evidenced by labs through collaboration with PharmD and provider.   Interventions: 1:1 collaboration with Allwardt, Randa Evens, PA-C regarding development and update of comprehensive plan of care as evidenced by provider attestation and co-signature Inter-disciplinary care team collaboration (see longitudinal plan of care) Comprehensive medication review performed; medication list updated in electronic medical record  Hypertension (BP goal <130/80) -Controlled -Current treatment: Losartan '100mg'$  daily -Medications previously tried: amlodipine  -Current home readings: 120s/70s -Current dietary habits: see DM -Current exercise habits: not much lately due to sciatica pain -Denies hypotensive/hypertensive symptoms -Educated on BP goals and benefits of medications for prevention of heart attack, stroke and kidney damage; Daily salt intake goal < 2300 mg; Exercise goal of 150 minutes per week; Importance of home blood pressure monitoring; -Counseled to monitor BP at home as current, document, and provide log at future appointments -Reports adherence with  BP med, does spike sometimes if she waits until later in the day to take her medication.  Always comes back down to normal. -Need to increase physical activity once pain improves -Recommended to continue current medication  Hyperlipidemia: (LDL goal < 70) -Not ideally controlled -Current treatment: Lovastatin '20mg'$  daily Appropriate, Query effective, -Medications previously tried: none noted  -Current dietary patterns: see DM -Current exercise habits: minimal -Educated on Cholesterol goals;  Benefits of statin for ASCVD risk reduction; Importance of limiting foods high in cholesterol; -LDL was elevated at last check.  Currently on low intensity statin. -Pt with DM consider at least moderate intensity. -Recommended to continue current medication Recheck lipid panel at [facility] in December, if remains elevated could consider increase to '40mg'$  or other mod intensity statin.  Update 06/08/22 Continues on same dose of Lovastatin. Lipid panel improved but LDL was slightly above goal 100. Encouraged her to continue adherence, continue to focus on healthy eating habits. No changes to medication at this time - room for dose titration to '40mg'$  of lovastatin if needed. FU at next annual labs.  Diabetes (A1c goal <7%) -Uncontrolled -Current medications: Farxiga '5mg'$  daily - started in August Appropriate, Effective, Safe, Accessible -Medications previously tried: none noted  -Current home glucose readings fasting glucose: 122, checks occasionally post prandial glucose: N/A -Denies hypoglycemic/hyperglycemic symptoms -Current meal patterns:  breakfast: fruit, cottage cheese, coffee  lunch: BLT sandwich  dinner: soups, smart ones meals snacks:  drinks: sprite zero, water -Current exercise: minimal due to pain -Educated on A1c and blood sugar goals; Benefits of routine self-monitoring of blood sugar; -Counseled to check feet daily and get yearly eye exams -Diet fairly low in sugars/ excess  carbs -Recommended to continue current medication Reckeck A1c at upcoming visit, hopefully A1c improved.  If not could consider dose increase to '10mg'$  daily.   Update 06/08/22 A1c improved to 7.3 after additional of Farxiga. No changes were made.  She continues to feel very good.  [place] of this call. Not checking glucose very much but when she did check last fasting her readings were in the 120s. Continue as directed.  Could consider dose increase to '10mg'$  in the future but I am happy where her A1c is currently. Continue to work on diet/exercise. She is due for FOOT EXAM, EYE EXAM, and A1c recheck.  Depression/Anxiety (Goal: Minimize symptoms) -Controlled -Current treatment: Sertraline '25mg'$  daily -Medications previously tried/failed: Lorazepam -PHQ9:  PHQ9 SCORE ONLY 07/02/2021 04/03/2021 03/10/2021  PHQ-9 Total Score 0 0 0    -Educated on Benefits of medication for symptom control -Recommended to continue current medication Reports sleep is good, no complaints of depressive symptoms.  Patient Goals/Self-Care Activities Patient will:  - take medications as prescribed as evidenced by patient report and record review check glucose a few times per week, document, and provide at future appointments target a minimum of 150 minutes of moderate intensity exercise weekly  Follow Up Plan: The care management team will reach out to the patient again over the next 120 days.           The patient verbalized understanding of instructions, educational materials, and care plan provided today and DECLINED offer to receive copy of patient instructions, educational materials, and care plan.  Telephone follow up appointment with pharmacy team member scheduled for: 6 months  Edythe Clarity, [place], PharmD Clinical Pharmacist  [facility] [phone]

## 2022-06-11 DIAGNOSIS — Z79899 Other long term (current) drug therapy: Secondary | ICD-10-CM | POA: Diagnosis not present

## 2022-06-11 DIAGNOSIS — M0600 Rheumatoid arthritis without rheumatoid factor, unspecified site: Secondary | ICD-10-CM | POA: Diagnosis not present

## 2022-06-12 ENCOUNTER — Encounter (HOSPITAL_BASED_OUTPATIENT_CLINIC_OR_DEPARTMENT_OTHER): Payer: Self-pay

## 2022-06-12 ENCOUNTER — Telehealth: Payer: Self-pay | Admitting: Physician Assistant

## 2022-06-12 ENCOUNTER — Emergency Department (HOSPITAL_BASED_OUTPATIENT_CLINIC_OR_DEPARTMENT_OTHER)
Admission: EM | Admit: 2022-06-12 | Discharge: 2022-06-12 | Disposition: A | Discharge status: Home / Self Care | Payer: Medicare HMO | Attending: Emergency Medicine | Admitting: Emergency Medicine

## 2022-06-12 ENCOUNTER — Other Ambulatory Visit: Payer: Self-pay

## 2022-06-12 DIAGNOSIS — Z7902 Long term (current) use of antithrombotics/antiplatelets: Secondary | ICD-10-CM | POA: Insufficient documentation

## 2022-06-12 DIAGNOSIS — R000 Tachycardia, unspecified: Secondary | ICD-10-CM | POA: Insufficient documentation

## 2022-06-12 DIAGNOSIS — R009 Unspecified abnormalities of heart beat: Secondary | ICD-10-CM

## 2022-06-12 LAB — BASIC METABOLIC PANEL
Anion gap: 10 (ref 5–15)
BUN: 29 mg/dL — ABNORMAL HIGH (ref 8–23)
CO2: 23 mmol/L (ref 22–32)
Calcium: 10.4 mg/dL — ABNORMAL HIGH (ref 8.9–10.3)
Chloride: 104 mmol/L (ref 98–111)
Creatinine, Ser: 1.27 mg/dL — ABNORMAL HIGH (ref 0.44–1.00)
GFR, Estimated: 42 mL/min — ABNORMAL LOW (ref >60)
Glucose, Bld: 114 mg/dL — ABNORMAL HIGH (ref 70–99)
Potassium: 3.6 mmol/L (ref 3.5–5.1)
Sodium: 137 mmol/L (ref 135–145)

## 2022-06-12 LAB — CBC
HCT: 43.8 % (ref 36.0–46.0)
Hemoglobin: 14.3 g/dL (ref 12.0–15.0)
MCH: 31.5 pg (ref 26.0–34.0)
MCHC: 32.6 g/dL (ref 30.0–36.0)
MCV: 96.5 fL (ref 80.0–100.0)
Platelets: 176 10*3/uL (ref 150–400)
RBC: 4.54 MIL/uL (ref 3.87–5.11)
RDW: 12.8 % (ref 11.5–15.5)
WBC: 6.1 10*3/uL (ref 4.0–10.5)
nRBC: 0 % (ref 0.0–0.2)

## 2022-06-12 NOTE — ED Triage Notes (Signed)
Patient here POV from Home.  Patient was receiving an Infusion through a PIV yesterday, which she has completed every 8 Weeks.   Patient endorses No Symptoms during Infusion but was told that her HR was fluctuating from 152 to 301 during Infusion with Associated BP of 41-36 Systolic.   Instructed to Seek ED Evaluation yesterday and today again by her PCP.    NAD Noted during Triage. A&Ox4. GCS 15. Ambulatory.

## 2022-06-12 NOTE — Telephone Encounter (Signed)
Pt stated the following BP and heart rate readings  06/11/2022 At Rheumatology appt (labs taken) 91/59, 52 at ??:?? 72/60, 240 at ??:?? 90/62,301 at ??:??  06/11/2022 At home 114/74, 73 at 20:23 128/76, 67 at "bedtime"  06/12/2022 At home 142/94, 63 at 06:30 At volunteering 158/105, 87 at 08:50   Pt states lab results from rheumatology office show elevated creatine. Pt states speciality faxing over results to [facility].  At time of note, FO Rep have not received results.  Pt referred to PCP triage nurse. Disconnect with pt to give data listed above to nurse, nurse will call pt.  Awaiting follow up notes.

## 2022-06-12 NOTE — Telephone Encounter (Addendum)
Pt states triage nurse did get ahold of her and advised pt to see a provider within 24 hours.   Because PCP offices are full, after consultation with pt's PCP, FO Rep explained to pt that PCP recommends going to an ED for care within the 24 hour window first established by triage nurse.  Pt states understanding and intent to go to [facility] [place] within the next 24 hours.   Awaiting PCP Triage Notes.

## 2022-06-12 NOTE — Telephone Encounter (Signed)
Please see triage note

## 2022-06-12 NOTE — Telephone Encounter (Signed)
Nurse unable to reach patient   Patient Name: Cheryl Wood Gender: Female DOB: 02-13-1936 Age: 86 Y 36 M 15 D Return Phone Number: [phone] (Primary) Address: City/ State/ Zip: [place]  [address] Client [place] [facility] Client Site [place] [facility] [place] Day Paediatric nurse, Freight forwarder- PA Contact Type Call Who Is Calling Patient / Member / Family / Caregiver Call Type Triage / Clinical Relationship To Patient Self Return Phone Number [phone] (Primary) Chief Complaint Blood Pressure High Reason for Call Symptomatic / Request for [place] states she is having High pressure 158/105 and heart rate of 87 at850 this morning at 630am 142/94 pulse of 63 and Yesterday she had 91/59 pulse of 52, 72/60 pulse of 240, 90/62 pulse of 301, 823p.m 114/74 pulse of 73 128/76 pulse of 67. Translation No Disp. Time Eilene Ghazi Time) Disposition Final User 06/12/2022 11:18:09 AM Attempt made - message left Etter Sjogren 06/12/2022 11:36:04 AM Attempt made - no message left Etter Sjogren 06/12/2022 11:52:01 AM FINAL ATTEMPT MADE - message left

## 2022-06-12 NOTE — Telephone Encounter (Signed)
Pt states she missed the call for the triage nurse. States the numbers given on the voicemail give the response of "not accepting calls at this time" or the call disconnects.  Pt was warm transferred to triage line to [facility] - patient care coordinator.

## 2022-06-12 NOTE — Telephone Encounter (Signed)
Please see pt call note

## 2022-06-12 NOTE — Telephone Encounter (Signed)
Patient Name: Cheryl Wood Gender: Female DOB: [birth year] Age: 86 Y 61 M 15 D Return Phone Number: [phone] (Primary) Address: City/ State/ Zip: [place]  [address] Client [place] [facility] [place] Client Site [place] [facility] [place] Day Paediatric nurse, Freight forwarder- PA Contact Type Call Who Is Calling Patient / Member / Family / Caregiver Call Type Triage / Clinical Relationship To Patient Self Return Phone Number [phone] (Primary) Chief Complaint Blood Pressure High Reason for Call Symptomatic / Request for [place] states she is having High pressure 158/105 and heart rate of 87 at 850 this morning at 630am142/94 pulse of 63 and Yesterday she had 91/59pulse of 52, 72/60 pulse of 240, 90/62 pulse of301, 823p.m 114/74 pulse of 73 128/76 pulse of67. saw MD yesterday and it was so low they wouldn't let her leave until it came up; gets an infusion; Translation No Nurse Assessment Nurse: Wynetta Fines, RN, Hildred Alamin Date/Time ([place] Time): 06/12/2022 1:40:25 PM Confirm and document reason for call. If symptomatic, describe symptoms. ---Caller states she is having High pressure 158/105 and heart rate of 87 at 850 this morning at 630am 142/94 pulse of 63, 1:07 BP was 110/74. yesterday she had an infusion with her rheumatologist, and during this infusion she had 91/59 and pulse of 52, 72/60 pulse of 240, 90/62 pulse of 301, 8:23p.m 114/74 pulse of 73 128/76 pulse of 67. saw MD yesterday and it was so low they wouldn't let her leave until it came up; gets an infusion; this was not her first time taking this infusion, but she has not had this problem before. She does not seem to be having any symptoms today besides her vision being weird (foggy). She has had a previous stroke in 2007 Does the patient have any new or worsening symptoms? ---Yes Will a triage be completed? ---Yes Related visit to  physician within the last 2 weeks? ---Yes Does the PT have any chronic conditions? (i.e. diabetes, asthma, this includes High risk factors for pregnancy, etc.) ---Yes  Guidelines Guideline Title Affirmed Question Affirmed Notes Nurse Date/Time (Eastern Time) Blood Pressure - High [9] Systolic BP < 381 with Diastolic < 80 AND [8] taking BP medications Theressa Stamps 06/12/2022 1:48:33 PM Disp. Time Eilene Ghazi Time) Disposition Final User 06/12/2022 12:53:54 PM Attempt made - message left Janalyn Shy 06/12/2022 1:16:28 PM Attempt made - message left Janalyn Shy 06/12/2022 1:24:51 PM Send To RN Personal Ria Comment, RN, Verdis Frederickson 06/12/2022 1:25:31 PM Send To RN Personal Ria Comment, RN, Verdis Frederickson 06/12/2022 1:58:19 PM See PCP within 24 Hours Yes Wynetta Fines, RN, Hildred Alamin Disposition Overriden: Home Care Override Reason: Specify reason. (Please document in 'advice recommended' section) Caller Disagree/Comply Comply Caller Understands Yes PreDisposition [place] Advice Given Per Guideline CALL PCP WITHIN 24 HOURS: SEE PCP WITHIN 24 HOURS: * IF OFFICE WILL BE OPEN: You need to be examined within the next 24 hours. Call your doctor (or NP/PA) when the office opens and make an appointment. * Consider both the urgency of the patient's symptoms AND what resources may be needed to evaluate and manage the patient. * Use nurse judgment to select the most appropriate source of care. CALL BACK IF: * Weakness or numbness of the face, arm or leg on one side of the body occurs * Difficulty walking, difficulty talking, or severe headache occurs * Chest pain or difficulty breathing occurs * You become worse CARE ADVICE given per High Blood Pressure (Adult) guideline.  Comments User: Geni Bers, RN Date/Time (Eastern Time): 06/12/2022 1:59:49 PM blurred vision started last week with no other symptoms, she did mention it to her doctor yesterday and her vision has not been blurry todayt, User: Geni Bers, RN Date/Time (Eastern Time): 06/12/2022 2:01:50 PM I upgraded the disposition to see PCP within 24 hours based on the recommendation of her rheumatologist that she be seen by her cardiologist, as well as how her blood pressure has been jumping from low to high over the last few days (currently blood pressure is normal with no symptoms)

## 2022-06-12 NOTE — Discharge Instructions (Signed)
Please call your cardiologist to discuss the episode of reported abnormally fast heart rate. If you have any sort of lightheadedness, dizziness, shortness of breath, palpitations or chest pain, please come back to ER for reassessment.

## 2022-06-13 NOTE — ED Provider Notes (Signed)
[facility] EMERGENCY DEPT Provider Note   CSN: [id number] Arrival date & time: 06/12/22  1534     History  Chief Complaint  Patient presents with   Tachycardia    Cheryl Wood is a 86 y.o. female.  Presents to ER due to concern for elevated heart rate and low blood pressure.  Patient yesterday was at her rheumatology office for a routine medication infusion appointment and she reports that while she was receiving the infusion they monitor her blood pressure.  She states that it was noted to be low and she was told that her heart rate was elevated.  Per my review of telephone encounters, PCP visit report that patient had reported heart rate as high as 300 and blood pressure at least low as 63S/93T systolic.  I asked patient to describe the type of monitoring that was completed at time of her infusion and she only recalls them checking blood pressure and she states that she was never on a cardiac monitor.  She states that she never had any sort of symptoms during the infusion or since this infusion.  Her PCP had advised going to ER for eval.  She denies having any issues with heart rhythm before such as A-fib. HPI     Home Medications Prior to Admission medications   Medication Sig Start Date End Date Taking? Authorizing Provider  acetaminophen (TYLENOL) 650 MG CR tablet Take 650 mg by mouth every 8 (eight) hours as needed for pain.    [provider]  b complex vitamins capsule Take 1 capsule by mouth daily.    [provider]  brimonidine (ALPHAGAN) 0.2 % ophthalmic solution Place 1 drop into both eyes 2 (two) times daily.    [provider]  clopidogrel (PLAVIX) 75 MG tablet TAKE ONE TABLET BY MOUTH DAILY Patient taking differently: Take 75 mg by mouth daily. 07/29/21   Allwardt, Randa Evens, PA-C  estradiol (ESTRACE) 0.1 MG/GM vaginal cream Place 0.5 g vaginally 2 (two) times a week. Place 0.5g twice a week 04/03/21   Jaquita Folds, MD   famciclovir [facility]) 500 MG tablet Take 500 mg by mouth daily.  01/24/19   [provider]  famotidine (PEPCID) 20 MG tablet Take 1 tablet (20 mg total) by mouth 2 (two) times daily. 12/12/21   Allwardt, [name] M, PA-C  FARXIGA 5 MG TABS tablet TAKE 1 TABLET BY MOUTH DAILY BEFORE BREAKFAST 05/08/22   Allwardt, [name] M, PA-C  furosemide (LASIX) 80 MG tablet Take 2 tablets (160 mg total) by mouth daily. 04/08/22   Allwardt, Randa Evens, PA-C  glucose blood (CONTOUR NEXT TEST) test strip Use as instructed 08/15/21   Allwardt, [name] M, PA-C  inFLIXimab (REMICADE) 100 MG injection Inject 100 mg into the vein every 8 (eight) weeks.    [provider]  Lancets MISC Check blood sugar once daily. Contour Next Device. 08/15/21   Allwardt, Randa Evens, PA-C  loratadine (CLARITIN) 10 MG tablet Take 10 mg by mouth daily.    [provider]  losartan (COZAAR) 100 MG tablet Take 1 tablet (100 mg total) by mouth daily. 04/08/22   Allwardt, Randa Evens, PA-C  lovastatin (MEVACOR) 20 MG tablet TAKE ONE TABLET BY MOUTH EVERY EVENING 01/01/22   Allwardt, [name] M, PA-C  Multiple Vitamins-Minerals (CENTRUM SILVER 50+WOMEN PO) Take 1 tablet by mouth daily.    [provider]  mycophenolate (CELLCEPT) 500 MG tablet Take 500 mg by mouth 2 (two) times daily. 10/03/21  [provider]  potassium chloride SA (KLOR-CON M20) 20 MEQ tablet Take 1 tablet (20 mEq total) by mouth daily. 02/12/22   Allwardt, Randa Evens, PA-C  pramipexole (MIRAPEX) 0.25 MG tablet Take 1 tablet (0.25 mg total) by mouth at bedtime. 02/12/22   Allwardt, Randa Evens, PA-C  prednisoLONE acetate (PRED FORTE) 1 % ophthalmic suspension Place 1 drop into both eyes in the morning and at bedtime. 01/18/21   [provider]  sertraline (ZOLOFT) 25 MG tablet TAKE ONE TABLET BY MOUTH DAILY 11/17/21   Allwardt, [name] M, PA-C  timolol (BETIMOL) 0.5 % ophthalmic solution Place 1 drop into the right eye 2 (two) times daily.     [provider]  Vibegron 75 MG TABS Take 1 tablet by mouth daily. 08/20/21   Jaquita Folds, MD      Allergies    Dorzolamide hcl-timolol mal, Tetanus toxoids, and Hctz [hydrochlorothiazide]    Review of Systems   Review of Systems  Constitutional:  Negative for chills and fever.  HENT:  Negative for ear pain and sore throat.   Eyes:  Negative for pain and visual disturbance.  Respiratory:  Negative for cough and shortness of breath.   Cardiovascular:  Negative for chest pain and palpitations.  Gastrointestinal:  Negative for abdominal pain and vomiting.  Genitourinary:  Negative for dysuria and hematuria.  Musculoskeletal:  Negative for arthralgias and back pain.  Skin:  Negative for color change and rash.  Neurological:  Negative for seizures and syncope.  All other systems reviewed and are negative.   Physical Exam Updated Vital Signs BP (!) 163/77   Pulse 60   Temp 98.1 F (36.7 C)   Resp 14   Ht '5\' 2"'$  (1.575 m)   Wt 73 kg   LMP  (LMP Unknown)   SpO2 100%   BMI 29.44 kg/m  Physical Exam Vitals and nursing note reviewed.  Constitutional:      General: She is not in acute distress.    Appearance: She is well-developed.  HENT:     Head: Normocephalic and atraumatic.  Eyes:     Conjunctiva/sclera: Conjunctivae normal.  Cardiovascular:     Rate and Rhythm: Normal rate and regular rhythm.     Heart sounds: No murmur heard. Pulmonary:     Effort: Pulmonary effort is normal. No respiratory distress.     Breath sounds: Normal breath sounds.  Abdominal:     Palpations: Abdomen is soft.     Tenderness: There is no abdominal tenderness.  Musculoskeletal:        General: No swelling.     Cervical back: Neck supple.  Skin:    General: Skin is warm and dry.     Capillary Refill: Capillary refill takes less than 2 seconds.  Neurological:     Mental Status: She is alert.  Psychiatric:        Mood and Affect: Mood normal.     ED Results / Procedures  / Treatments   Labs (all labs ordered are listed, but only abnormal results are displayed) Labs Reviewed  BASIC METABOLIC PANEL - Abnormal; Notable for the following components:      Result Value   Glucose, Bld 114 (*)    BUN 29 (*)    Creatinine, Ser 1.27 (*)    Calcium 10.4 (*)    GFR, Estimated 42 (*)    All other components within normal limits  CBC    EKG EKG Interpretation  Date/Time:  Friday June 12 2022 15:45:19 EDT Ventricular Rate:  82 PR Interval:  188 QRS Duration: 88 QT Interval:  364 QTC Calculation: 425 R Axis:   25 Text Interpretation: Sinus rhythm with marked sinus arrhythmia Cannot rule out Anterior infarct , age undetermined Abnormal ECG No previous ECGs available Confirmed by Madalyn Rob [phone]) on 06/12/2022 7:37:47 PM  Radiology No results found.  Procedures Procedures    Medications Ordered in ED Medications - No data to display  ED Course/ Medical Decision Making/ A&P                           Medical Decision Making Amount and/or Complexity of Data Reviewed Labs: ordered.   87 year old lady presents to ER due to concern for possible low blood pressure and high heart rate while receiving an infusion at her rheumatology office.  This infusion happened greater than 24 hours ago, per review of chart and care everywhere I am unable to see the rheumatology office visit note but can see that there was an encounter.  I did review the telephone notes from her PCP.  There was report that her heart rate may have been as high as 300.  However when I asked patient about where these numbers came from this was from a handwritten sticky note and patient states that she was not on a cardiac monitor when this was recorded.  She furthermore had no symptoms whatsoever while she received her infusion.  For today's visit, we placed patient on her cardiac monitor and monitor her for a little while and check some basic labs and an EKG.  She has no anemia, no  electrolyte derangements, normal kidney function.  Her EKG demonstrated sinus rhythm.  On cardiac monitor she remained persistently in sinus rhythm with a very regular heart rate, mostly around 60s.  Given that patient was asymptomatic during this possible high heart rate and has had no symptoms since that time and has normal vital signs right now I feel she can be discharged and managed in the outpatient setting and does not require admission at this time.  I did feel it would be prudent for patient to follow-up with cardiology about this possible episode of very elevated heart rate and be considered for a longer term Holter monitor.  Placed order for cardiology referral.  Instructed patient to call their office on Monday as well as her PCP office to get follow-up appointments with them.  Reviewed return precautions should she develop any concerning symptoms like chest pain or palpitations or lightheadedness or passing out.  She demonstrated good understanding of these return precautions and was discharged home.  After the discussed management above, the patient was determined to be safe for discharge.  The patient was in agreement with this plan and all questions regarding their care were answered.  ED return precautions were discussed and the patient will return to the ED with any significant worsening of condition.         Final Clinical Impression(s) / ED Diagnoses Final diagnoses:  Abnormal heart rate    Rx / DC Orders ED Discharge Orders          Ordered    Ambulatory referral to Cardiology        06/12/22 1939              Lucrezia Starch, MD 06/13/22 2101

## 2022-06-16 ENCOUNTER — Encounter: Payer: Self-pay | Admitting: Physician Assistant

## 2022-06-16 ENCOUNTER — Ambulatory Visit (INDEPENDENT_AMBULATORY_CARE_PROVIDER_SITE_OTHER): Payer: Medicare HMO | Admitting: Physician Assistant

## 2022-06-16 VITALS — BP 117/68 | HR 63 | Temp 97.8°F | Ht 62.0 in | Wt 160.0 lb

## 2022-06-16 DIAGNOSIS — E1165 Type 2 diabetes mellitus with hyperglycemia: Secondary | ICD-10-CM

## 2022-06-16 DIAGNOSIS — I10 Essential (primary) hypertension: Secondary | ICD-10-CM | POA: Diagnosis not present

## 2022-06-16 LAB — POCT GLYCOSYLATED HEMOGLOBIN (HGB A1C): Hemoglobin A1C: 6.6 % — AB (ref 4.0–5.6)

## 2022-06-16 NOTE — Progress Notes (Signed)
Subjective:    Patient ID: Cheryl Wood, female    DOB: September 02, 1936, 86 y.o.   MRN: [id number]  Chief Complaint  Patient presents with   Follow-up    Pt coming in for f/u since being seen in ED; pt says everything checked out normal when at the ED, pt says when getting infusion bp was low and pulse was extremely high; when bp gets low pt says its like she has a cloud over her eyes, gets occasional flutters but not often.      HPI Patient is in today for ED f/up from 06/12/22 for presentation of possible abnormal HR and BP's. EKG, labs, everything normal. Told to f/up with PCP and cardiology.  Had infusion at rheumatology office the day prior, normally takes around 2 hours. Says this was 3rd infusion with new medicine ([place] ARIA) - takes this for her eyes , only should take an hour. Started having low BP & ?abnormal HR readings -Did not feel bad or lightheaded. Rheum was not going to let her leave until BP & pulse normalized. Gave PB crackers and water, finally stable vitals for discharge from them.  Has a sticky note with her from 06-11-22 while at rheum office -  152  91/59 240  72/60 301  90/62 *Unclear whether the numbers in front of BP were HR readings or time readings.   BP & HR over last 24 hours: 114/74, 73  bpm 128/76   67 bpm 99/72,  70 bpm - this morning    -Every 8 weeks gets these infusions - supposed to take an antihistamine and Tylenol prior to going; usually takes all her meds before she goes as well -Every now and then has a flutter in her chest. Some SOB with it. Goes away as quickly as it comes on.  -Dr. Gwenlyn Found is her cardiologist. Says she has only seen him one time.    Past Medical History:  Diagnosis Date   Allergy    Arthritis    Bleeding of blood vessel    ext. 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     RIGHT/LEFT HEART CATH AND CORONARY ANGIOGRAPHY N/A 05/12/2018   Procedure: RIGHT/LEFT HEART CATH AND CORONARY ANGIOGRAPHY;  Surgeon: Belva Crome, MD;  Location: [facility];  Service: Cardiovascular;  Laterality: N/A;   THORACIC AORTOGRAM N/A 05/12/2018   Procedure: THORACIC AORTOGRAM;  Surgeon: Belva Crome, MD;  Location: [facility];  Service: Cardiovascular;  Laterality: N/A;   TONSILLECTOMY      Family History  Problem Relation Age of Onset   Cancer Mother    Early death Mother    Heart disease Father    Arthritis Father  Early death Father    Hearing loss Father    Hypertension Father    Hyperlipidemia Father    Cancer Sister    Arthritis Sister    Arthritis Brother    Diabetes Brother    Heart attack Brother    Heart disease Brother    Hyperlipidemia Brother    Hypertension Brother    Diabetes Daughter    Heart disease Daughter    Hyperlipidemia Daughter    Hyperlipidemia Maternal Grandmother    Hypertension Maternal Grandmother    Stroke Maternal Grandmother    Alcohol abuse Maternal Grandfather    Early death Maternal Grandfather        tuberculosis   Arthritis Paternal Grandmother    Alcohol abuse Paternal Grandfather    Heart disease Paternal Grandfather    Hyperlipidemia Paternal Grandfather    Hypertension Paternal Grandfather    Stroke Paternal Grandfather    Alcohol abuse Sister    COPD Sister    Drug abuse Sister    Early death Sister    Heart disease Sister    Hypertension Sister    Hyperlipidemia Sister    Early death  Brother    Early death Brother     Social History   Tobacco Use   Smoking status: Former    Years: 40.00    Types: Cigarettes    Quit date: 11/05/1971    Years since quitting: 50.6   Smokeless tobacco: Never  Vaping Use   Vaping Use: Never used  Substance Use Topics   Alcohol use: No    Alcohol/week: 0.0 standard drinks of alcohol   Drug use: No     Allergies  Allergen Reactions   Dorzolamide Hcl-Timolol Mal Other (See Comments)    Red eyes   Tetanus Toxoids Swelling    Arm was twice the size it should be   Hctz [Hydrochlorothiazide] Rash    Review of Systems NEGATIVE UNLESS OTHERWISE INDICATED IN HPI      Objective:     BP 117/68 (BP Location: Right Arm)   Pulse 63   Temp 97.8 F (36.6 C) (Temporal)   Ht '5\' 2"'$  (1.575 m)   Wt 160 lb (72.6 kg)   LMP  (LMP Unknown)   SpO2 96%   BMI 29.26 kg/m   Wt Readings from Last 3 Encounters:  06/16/22 160 lb (72.6 kg)  06/12/22 160 lb 15 oz (73 kg)  04/10/22 161 lb (73 kg)    BP Readings from Last 3 Encounters:  06/16/22 117/68  06/12/22 (!) 163/77  05/29/22 110/69     Physical Exam Constitutional:      Appearance: Normal appearance.  HENT:     Head: Normocephalic.  Cardiovascular:     Rate and Rhythm: Normal rate and regular rhythm.     Pulses: Normal pulses.     Heart sounds: Murmur (previously known murmur RUSB) heard.  Pulmonary:     Effort: Pulmonary effort is normal.     Breath sounds: Normal breath sounds.  Musculoskeletal:     Right lower leg: No edema.     Left lower leg: No edema.  Neurological:     Mental Status: She is alert.  Psychiatric:        Mood and Affect: Mood normal.        Behavior: Behavior normal.        Assessment & Plan:   Problem List Items Addressed This Visit   None Visit Diagnoses     Essential hypertension    -  Primary   Type 2 diabetes mellitus with hyperglycemia, without long-term current use of insulin (HCC)       Relevant Orders   POCT HgB A1C  (Completed)      1. Essential hypertension BP readings stable. Asymptomatic. Cont losartan 100 mg daily. Informed she may want to hold this medication on days of her infusions with rheum. I personally reviewed ED report including labs and ECG. No concerning findings. Reviewed sticky note - reassured pt this was most likely the timestamp of her BP readings at rheum office and not her HR readings. Plan to f/up with cardiology.   2. Type 2 diabetes mellitus with hyperglycemia, without long-term current use of insulin ([facility]) Today's Ha1c reading is at 6.6, which is down from 7.3 about 6 months ago. Doing very well, encouraged her to keep up good work.  Cont Farxiga 5 mg daily.    This note was prepared with assistance of Systems analyst. Occasional wrong-word or sound-a-like substitutions may have occurred due to the inherent limitations of voice recognition software.    Molleigh Huot M Myles Mallicoat, PA-C

## 2022-06-22 ENCOUNTER — Ambulatory Visit: Payer: Medicare HMO | Admitting: Physician Assistant

## 2022-06-23 ENCOUNTER — Ambulatory Visit (INDEPENDENT_AMBULATORY_CARE_PROVIDER_SITE_OTHER): Payer: Medicare HMO

## 2022-06-23 ENCOUNTER — Encounter: Payer: Self-pay | Admitting: Physician Assistant

## 2022-06-23 ENCOUNTER — Ambulatory Visit (INDEPENDENT_AMBULATORY_CARE_PROVIDER_SITE_OTHER): Payer: Medicare HMO | Admitting: Physician Assistant

## 2022-06-23 VITALS — BP 132/74 | HR 62 | Ht 62.0 in | Wt 158.6 lb

## 2022-06-23 DIAGNOSIS — E785 Hyperlipidemia, unspecified: Secondary | ICD-10-CM | POA: Diagnosis not present

## 2022-06-23 DIAGNOSIS — R000 Tachycardia, unspecified: Secondary | ICD-10-CM | POA: Diagnosis not present

## 2022-06-23 DIAGNOSIS — E119 Type 2 diabetes mellitus without complications: Secondary | ICD-10-CM | POA: Diagnosis not present

## 2022-06-23 DIAGNOSIS — Z8673 Personal history of transient ischemic attack (TIA), and cerebral infarction without residual deficits: Secondary | ICD-10-CM | POA: Diagnosis not present

## 2022-06-23 DIAGNOSIS — I10 Essential (primary) hypertension: Secondary | ICD-10-CM | POA: Diagnosis not present

## 2022-06-23 NOTE — Progress Notes (Signed)
Cardiology Office Note:    Date:  06/25/2022   ID:  Cheryl Wood, DOB 1936/01/30, MRN [id number]  PCP:  Allwardt, Randa Evens, PA-C   [facility] Providers Cardiologist:  Quay Burow, MD     Referring MD: Lucrezia Starch, MD   Chief Complaint  Patient presents with   Follow-up    Seen for Dr. Gwenlyn Found    History of Present Illness:    Cheryl Wood is a 86 y.o. 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  Patient was previously followed by Dr. Tollie Eth will last saw her in May 2019.  Patient has never had a heart attack.  Patient was initially referred to [place] for evaluation of dyspnea on exertion.  She denied any chest pain.  Echocardiogram performed on 03/28/2021 showed normal EF, grade 1 DD, mild to moderate AI.  Left and right heart cath performed by Dr. Tamala Julian revealed minimal CAD with wedge pressure of 10 and LVEDP of 23.  More recently, patient presented to [place] for evaluation of elevated heart rate in the low blood pressure that occurred during infusion at rheumatology office.  Telephone visit suggested blood pressure was as low was 70/50, and heart rate as high as 300.  Based on sticky note from her rheumatology office, blood pressure was 91/51, heart rate 152. BP 72/60, heart rate of 240.  Blood pressure 90/62, heart rate 301.  The event occurred more than 24 hours prior to her ED arrival.  Lab work in the ED were normal.  EKG shows sinus rhythm.  Patient presents today for follow-up.  He denies any cardiac awareness of the recent tachycardia palpitation.  He denies any dizziness or feeling of passing out.  The only thing she experienced was blurry vision.  I recommended a 2-week ZIO monitor non-live.  We will see the patient back in 6 weeks.   Past Medical History:  Diagnosis Date   Allergy    Arthritis    Bleeding of blood vessel    ext. genitalia area   Blood  in stool    Chronic kidney disease    Depression    Glaucoma    Heart murmur    History of chicken pox    History of recurrent UTIs    History of stomach ulcers    Hyperlipidemia    Hypertension    Memory loss    Rheumatic fever    Seizures ([place])    Stroke ([place])    2979; embolic   TIA (transient ischemic attack) 10/22/2013   Urine incontinence     Past Surgical History:  Procedure Laterality Date   ABDOMINAL HYSTERECTOMY     APPENDECTOMY  1983   CATARACT EXTRACTION Right May 13, 2014   EYE SURGERY  12/13/2018   stent inserted in left eye   gum transplant     RIGHT/LEFT HEART CATH AND CORONARY ANGIOGRAPHY N/A 05/12/2018   Procedure: RIGHT/LEFT HEART CATH AND CORONARY ANGIOGRAPHY;  Surgeon: Belva Crome, MD;  Location: [facility];  Service: Cardiovascular;  Laterality: N/A;   THORACIC AORTOGRAM N/A 05/12/2018   Procedure: THORACIC AORTOGRAM;  Surgeon: Belva Crome, MD;  Location: [facility];  Service: Cardiovascular;  Laterality: N/A;   TONSILLECTOMY      Current Medications: Current Meds  Medication Sig   acetaminophen (TYLENOL) 650 MG CR tablet Take 650 mg by mouth every 8 (eight)  hours as needed for pain.   brimonidine (ALPHAGAN) 0.2 % ophthalmic solution Place 1 drop into both eyes 2 (two) times daily.   clopidogrel (PLAVIX) 75 MG tablet TAKE ONE TABLET BY MOUTH DAILY (Patient taking differently: Take 75 mg by mouth daily.)   estradiol (ESTRACE) 0.1 MG/GM vaginal cream Place 0.5 g vaginally 2 (two) times a week. Place 0.5g twice a week   famciclovir (FAMVIR) 500 MG tablet Take 500 mg by mouth daily.    famotidine (PEPCID) 20 MG tablet Take 1 tablet (20 mg total) by mouth 2 (two) times daily.   FARXIGA 5 MG TABS tablet TAKE 1 TABLET BY MOUTH DAILY BEFORE BREAKFAST   furosemide (LASIX) 80 MG tablet Take 2 tablets (160 mg total) by mouth daily.   glucose blood (CONTOUR NEXT TEST) test strip Use as instructed   Lancets MISC Check blood sugar once daily.  Contour Next Device.   loratadine (CLARITIN) 10 MG tablet Take 10 mg by mouth daily.   losartan (COZAAR) 100 MG tablet Take 1 tablet (100 mg total) by mouth daily.   lovastatin (MEVACOR) 20 MG tablet TAKE ONE TABLET BY MOUTH EVERY EVENING   mycophenolate (CELLCEPT) 500 MG tablet Take 500 mg by mouth 2 (two) times daily.   potassium chloride SA (KLOR-CON M20) 20 MEQ tablet Take 1 tablet (20 mEq total) by mouth daily.   pramipexole (MIRAPEX) 0.25 MG tablet Take 1 tablet (0.25 mg total) by mouth at bedtime.   prednisoLONE acetate (PRED FORTE) 1 % ophthalmic suspension Place 1 drop into both eyes in the morning and at bedtime.   sertraline (ZOLOFT) 25 MG tablet TAKE ONE TABLET BY MOUTH DAILY   timolol (BETIMOL) 0.5 % ophthalmic solution Place 1 drop into the right eye 2 (two) times daily.   Vibegron 75 MG TABS Take 1 tablet by mouth daily.     Allergies:   Dorzolamide hcl-timolol mal, Tetanus toxoids, and Hctz [hydrochlorothiazide]   Social History   Socioeconomic History   Marital status: Divorced    Spouse name: Not on file   Number of children: 2   Years of education: college   Highest education level: Not on file  Occupational History    Employer: RETIRED  Tobacco Use   Smoking status: Former    Years: 40.00    Types: Cigarettes    Quit date: 11/05/1971    Years since quitting: 50.6   Smokeless tobacco: Never  Vaping Use   Vaping Use: Never used  Substance and Sexual Activity   Alcohol use: No    Alcohol/week: 0.0 standard drinks of alcohol   Drug use: No   Sexual activity: Not Currently  Other Topics Concern   Not on file  Social History Narrative   Patient lives at home alone   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   Worked at [employer] for 18 years   Daughters in [place] and in [place]   Caffeine Use: 2-3 cups daily   Social Determinants of Health   Financial Resource Strain: Low Risk  (03/16/2022)   Overall Financial Resource  Strain (CARDIA)    Difficulty of Paying Living Expenses: Not hard at all  Food Insecurity: No Food Insecurity (03/16/2022)   Hunger Vital Sign    Worried About Running Out of Food in the Last Year: Never true    [place] in the Last Year: Never true  Transportation Needs: No Transportation Needs (03/16/2022)   [place] -  Hydrologist (Medical): No    Lack of Transportation (Non-Medical): No  Physical Activity: Inactive (03/16/2022)   Exercise Vital Sign    Days of Exercise per Week: 0 days    Minutes of Exercise per Session: 0 min  Stress: No Stress Concern Present (03/16/2022)   [place]    Feeling of Stress : Not at all  Social Connections: Moderately Isolated (03/16/2022)   Social Connection and Isolation Panel [NHANES]    Frequency of Communication with Friends and Family: More than three times a week    Frequency of Social Gatherings with Friends and Family: More than three times a week    Attends Religious Services: 1 to 4 times per year    Active Member of [employer] or Organizations: No    Attends Archivist Meetings: Never    Marital Status: Divorced     Family History: The patient's family history includes Alcohol abuse in her maternal grandfather, paternal grandfather, and sister; Arthritis in her brother, father, paternal grandmother, and sister; COPD in her sister; Cancer in her mother and sister; Diabetes in her brother and daughter; Drug abuse in her sister; Early death in her brother, brother, father, maternal grandfather, mother, and sister; Hearing loss in her father; Heart attack in her brother; Heart disease in her brother, daughter, father, paternal grandfather, and sister; Hyperlipidemia in her brother, daughter, father, maternal grandmother, paternal grandfather, and sister; Hypertension in her brother, father, maternal grandmother, paternal grandfather, and sister;  Stroke in her maternal grandmother and paternal grandfather.  ROS:   Please see the history of present illness.     All other systems reviewed and are negative.  EKGs/Labs/Other Studies Reviewed:    The following studies were reviewed today:  Cath 05/12/2018 Right dominant coronary anatomy. RCA contains luminal irregularities. Left main is normal. Mid LAD contains irregularities up to 50% following the first diagonal.  The proximal LAD contains mild ectasia. Circumflex contains proximal irregularities and ectasia.  No obstructive lesions are noted. Dilated ascending aorta. Trace to mild aortic regurgitation (1+). Normal LV size, and systolic function with estimated ejection fraction 65%.  LVEDP 23 mmHg.  This finding is consistent with chronic diastolic dysfunction/heart failure. Trivial to 1+ mitral regurgitation Normal pulmonary artery pressures and mean pulmonary capillary wedge of 10 mmHg.   RECOMMENDATIONS:   Per Dr. Wynonia Lawman.   Echo 03/28/2021 1. Left ventricular ejection fraction, by estimation, is 60 to 65%. The  left ventricle has normal function. The left ventricle has no regional  wall motion abnormalities. Left ventricular diastolic parameters are  consistent with Grade I diastolic  dysfunction (impaired relaxation).   2. Right ventricular systolic function is normal. The right ventricular  size is normal. There is normal pulmonary artery systolic pressure.   3. The mitral valve is normal in structure. No evidence of mitral valve  regurgitation. No evidence of mitral stenosis.   4. The aortic valve is calcified. There is moderate calcification of the  aortic valve. There is moderate thickening of the aortic valve. Aortic  valve regurgitation is mild to moderate. No aortic stenosis is present.   5. The inferior vena cava is normal in size with greater than 50%  respiratory variability, suggesting right atrial pressure of 3 mmHg.   Conclusion(s)/Recommendation(s): No  intracardiac source of embolism  detected on this transthoracic study. A transesophageal echocardiogram is  recommended to exclude cardiac source of embolism if clinically indicated.  EKG:  EKG is ordered today.  The ekg ordered today demonstrates normal sinus rhythm, no significant ST-T wave changes.  First-degree AV block.  Recent Labs: 08/28/2021: B Natriuretic Peptide 97.8 12/08/2021: ALT 14 06/12/2022: BUN 29; Creatinine, Ser 1.27; Hemoglobin 14.3; Platelets 176; Potassium 3.6; Sodium 137 06/23/2022: TSH 0.975  Recent Lipid Panel    Component Value Date/Time   CHOL 189 12/08/2021 0944   TRIG 169.0 (H) 12/08/2021 0944   HDL 52.50 12/08/2021 0944   CHOLHDL 4 12/08/2021 0944   VLDL 33.8 12/08/2021 0944   LDLCALC 102 (H) 12/08/2021 0944   LDLDIRECT 136.0 01/25/2019 1149     Risk Assessment/Calculations:           Physical Exam:    VS:  BP 132/74 (BP Location: Left Arm, Patient Position: Sitting, Cuff Size: Normal)   Pulse 62   Ht '5\' 2"'$  (1.575 m)   Wt 158 lb 9.6 oz (71.9 kg)   LMP  (LMP Unknown)   SpO2 97%   BMI 29.01 kg/m     Wt Readings from Last 3 Encounters:  06/23/22 158 lb 9.6 oz (71.9 kg)  06/16/22 160 lb (72.6 kg)  06/12/22 160 lb 15 oz (73 kg)     GEN:  Well nourished, well developed in no acute distress HEENT: Normal NECK: No JVD; No carotid bruits LYMPHATICS: No lymphadenopathy CARDIAC: RRR, no murmurs, rubs, gallops RESPIRATORY:  Clear to auscultation without rales, wheezing or rhonchi  ABDOMEN: Soft, non-tender, non-distended MUSCULOSKELETAL:  No edema; No deformity  SKIN: Warm and dry NEUROLOGIC:  Alert and oriented x 3 PSYCHIATRIC:  Normal affect   ASSESSMENT:    1. Tachycardia   2. Primary hypertension   3. Hyperlipidemia LDL goal <70   4. Controlled type 2 diabetes mellitus without complication, without long-term current use of insulin ([place])   5. H/O: CVA (cerebrovascular accident)    PLAN:    In order of problems listed  above:  Tachycardia: Patient recently presented to the hospital due to episode of tachycardia, blood pressure was reportedly in the 70s at home with heart rate up to the 200-300 range.  I suspect the patient was likely in SVT.  The event occurred more than 24 hours prior to ED arrival.  EKG obtained in the emergency room showed normal sinus rhythm.  I recommended a heart monitor to further assess.  Obtain TSH  Hypertension: Blood pressure stable  Hyperlipidemia: On lovastatin  DM2: Managed by primary care provider  History of CVA: No recent recurrence.           Medication Adjustments/Labs and Tests Ordered: Current medicines are reviewed at length with the patient today.  Concerns regarding medicines are outlined above.  Orders Placed This Encounter  Procedures   TSH   LONG TERM MONITOR (3-14 DAYS)   EKG 12-Lead   No orders of the defined types were placed in this encounter.   Patient Instructions  Medication Instructions:  Your physician recommends that you continue on your current medications as directed. Please refer to the Current Medication list given to you today.  *If you need a refill on your cardiac medications before your next appointment, please call your pharmacy*  Lab Work: Your physician recommends that you return for lab work TODAY:  TSH If you have labs (blood work) drawn today and your tests are completely normal, you will receive your results only by: [place] (if you have MyChart) OR A paper copy in the mail If you have any lab  test that is abnormal or we need to change your treatment, we will call you to review the results.  Testing/Procedures:  Bryn Gulling- Long Term Monitor Instructions  Your physician has requested you wear a ZIO patch monitor for 14 days.  This is a single patch monitor. [employer] supplies one patch monitor per enrollment. Additional stickers are not available. Please do not apply patch if you will be having a Nuclear Stress  Test,  Echocardiogram, Cardiac CT, MRI, or Chest Xray during the period you would be wearing the  monitor. The patch cannot be worn during these tests. You cannot remove and re-apply the  ZIO XT patch monitor.  Your ZIO patch monitor will be mailed 3 day USPS to your address on file. It may take 3-5 days  to receive your monitor after you have been enrolled.  Once you have received your monitor, please review the enclosed instructions. Your monitor  has already been registered assigning a specific monitor serial # to you.  Billing and Patient Assistance Program Information  We have supplied [employer] with any of your insurance information on file for billing purposes. [employer] offers a sliding scale Patient Assistance Program for patients that do not have  insurance, or whose insurance does not completely cover the cost of the ZIO monitor.  You must apply for the Patient Assistance Program to qualify for this discounted rate.  To apply, please call [employer] at [phone], select option 4, select option 2, ask to apply for  Patient Assistance Program. Theodore Demark will ask your household income, and how many people  are in your household. They will quote your out-of-pocket cost based on that information.  [employer] will also be able to set up a 72-month interest-free payment plan if needed.  Applying the monitor   Shave hair from upper left chest.  Hold abrader disc by orange tab. Rub abrader in 40 strokes over the upper left chest as  indicated in your monitor instructions.  Clean area with 4 enclosed alcohol pads. Let dry.  Apply patch as indicated in monitor instructions. Patch will be placed under collarbone on left  side of chest with arrow pointing upward.  Rub patch adhesive wings for 2 minutes. Remove white label marked "1". Remove the white  label marked "2". Rub patch adhesive wings for 2 additional minutes.  While looking in a mirror, press and release button in center of patch. A  small green light will  flash 3-4 times. This will be your only indicator that the monitor has been turned on.  Do not shower for the first 24 hours. You may shower after the first 24 hours.  Press the button if you feel a symptom. You will hear a small click. Record Date, Time and  Symptom in the Patient Logbook.  When you are ready to remove the patch, follow instructions on the last 2 pages of Patient  Logbook. Stick patch monitor onto the last page of Patient Logbook.  Place Patient Logbook in the blue and white box. Use locking tab on box and tape box closed  securely. The blue and white box has prepaid postage on it. Please place it in the mailbox as  soon as possible. Your physician should have your test results approximately 7 days after the  monitor has been mailed back to [facility]  Call [facility]at [phone] you have questions regarding  your ZIO XT patch monitor. Call them immediately if you see an orange light blinking on  your  monitor.  If your monitor falls off in less than 4 days, contact our Monitor department at [phone].  If your monitor becomes loose or falls off after 4 days call [employer] at [phone] for  suggestions on securing your monitor    Follow-Up: At [facility], you and your health needs are our priority.  As part of our continuing mission to provide you with exceptional heart care, we have created designated Provider Care Teams.  These Care Teams include your primary Cardiologist (physician) and Advanced Practice Providers (APPs -  Physician Assistants and Nurse Practitioners) who all work together to provide you with the care you need, when you need it.  We recommend signing up for the patient portal called "MyChart".  Sign up information is provided on this After Visit Summary.  MyChart is used to connect with patients for Virtual Visits (Telemedicine).  Patients are able to view lab/test results, encounter notes,  upcoming appointments, etc.  Non-urgent messages can be sent to your provider as well.   To learn more about what you can do with MyChart, go to [email or web address].    Your next appointment:   6 week(s)  The format for your next appointment:   In Person  Provider:   Almyra Deforest, PA-C        Other Instructions   Important Information About Sugar         Signed, Almyra Deforest, [place]  06/25/2022 11:11 PM    [place]

## 2022-06-23 NOTE — Progress Notes (Unsigned)
Enrolled patient for a 14 day Zio XT monitor to be mailed to patients home   Dr Gwenlyn Found to read

## 2022-06-24 LAB — TSH: TSH: 0.975 u[IU]/mL (ref 0.450–4.500)

## 2022-06-25 ENCOUNTER — Encounter: Payer: Self-pay | Admitting: Physician Assistant

## 2022-06-25 DIAGNOSIS — I781 Nevus, non-neoplastic: Secondary | ICD-10-CM | POA: Diagnosis not present

## 2022-06-25 DIAGNOSIS — L821 Other seborrheic keratosis: Secondary | ICD-10-CM | POA: Diagnosis not present

## 2022-06-25 DIAGNOSIS — I8312 Varicose veins of left lower extremity with inflammation: Secondary | ICD-10-CM | POA: Diagnosis not present

## 2022-06-25 DIAGNOSIS — L578 Other skin changes due to chronic exposure to nonionizing radiation: Secondary | ICD-10-CM | POA: Diagnosis not present

## 2022-06-25 DIAGNOSIS — Z859 Personal history of malignant neoplasm, unspecified: Secondary | ICD-10-CM | POA: Diagnosis not present

## 2022-06-25 DIAGNOSIS — I8311 Varicose veins of right lower extremity with inflammation: Secondary | ICD-10-CM | POA: Diagnosis not present

## 2022-06-25 DIAGNOSIS — L814 Other melanin hyperpigmentation: Secondary | ICD-10-CM | POA: Diagnosis not present

## 2022-06-25 DIAGNOSIS — D1801 Hemangioma of skin and subcutaneous tissue: Secondary | ICD-10-CM | POA: Diagnosis not present

## 2022-06-25 DIAGNOSIS — Z85828 Personal history of other malignant neoplasm of skin: Secondary | ICD-10-CM | POA: Diagnosis not present

## 2022-06-26 DIAGNOSIS — R000 Tachycardia, unspecified: Secondary | ICD-10-CM

## 2022-07-10 ENCOUNTER — Telehealth: Payer: Self-pay | Admitting: Pharmacist

## 2022-07-10 NOTE — Progress Notes (Unsigned)
Chronic Care Management Pharmacy Assistant   Name: JULI ODOM  MRN: [id number] DOB: January 20, 1936   Reason for Encounter: Diabetes Adherence Call    Recent office visits:  06/16/2022 [facility] (PCP) Allwardt, [name] M, PA-C; no medication changes indicated.  Recent consult visits:  06/23/2022 [facility] (Cardiology) Almyra Deforest, [place]; no medication changes indicated.  Hospital visits:  06/12/2022 ED visit for Abnormal heart rate -No medication changes  Medications: Outpatient Encounter Medications as of 07/10/2022  Medication Sig Note   acetaminophen (TYLENOL) 650 MG CR tablet Take 650 mg by mouth every 8 (eight) hours as needed for pain.    b complex vitamins capsule Take 1 capsule by mouth daily. (Patient not taking: Reported on 06/23/2022)    brimonidine (ALPHAGAN) 0.2 % ophthalmic solution Place 1 drop into both eyes 2 (two) times daily.    clopidogrel (PLAVIX) 75 MG tablet TAKE ONE TABLET BY MOUTH DAILY (Patient taking differently: Take 75 mg by mouth daily.)    estradiol (ESTRACE) 0.1 MG/GM vaginal cream Place 0.5 g vaginally 2 (two) times a week. Place 0.5g twice a week    famciclovir (FAMVIR) 500 MG tablet Take 500 mg by mouth daily.     famotidine (PEPCID) 20 MG tablet Take 1 tablet (20 mg total) by mouth 2 (two) times daily.    FARXIGA 5 MG TABS tablet TAKE 1 TABLET BY MOUTH DAILY BEFORE BREAKFAST    furosemide (LASIX) 80 MG tablet Take 2 tablets (160 mg total) by mouth daily.    glucose blood (CONTOUR NEXT TEST) test strip Use as instructed    Lancets MISC Check blood sugar once daily. Contour Next Device.    loratadine (CLARITIN) 10 MG tablet Take 10 mg by mouth daily.    losartan (COZAAR) 100 MG tablet Take 1 tablet (100 mg total) by mouth daily.    lovastatin (MEVACOR) 20 MG tablet TAKE ONE TABLET BY MOUTH EVERY EVENING    Multiple Vitamins-Minerals (CENTRUM SILVER 50+WOMEN PO) Take 1 tablet by mouth daily. (Patient not taking: Reported on 06/23/2022)    mycophenolate (CELLCEPT) 500  MG tablet Take 500 mg by mouth 2 (two) times daily. 06/23/2022: Patient takes 500 mg daily   potassium chloride SA (KLOR-CON M20) 20 MEQ tablet Take 1 tablet (20 mEq total) by mouth daily.    pramipexole (MIRAPEX) 0.25 MG tablet Take 1 tablet (0.25 mg total) by mouth at bedtime.    prednisoLONE acetate (PRED FORTE) 1 % ophthalmic suspension Place 1 drop into both eyes in the morning and at bedtime.    sertraline (ZOLOFT) 25 MG tablet TAKE ONE TABLET BY MOUTH DAILY    timolol (BETIMOL) 0.5 % ophthalmic solution Place 1 drop into the right eye 2 (two) times daily.    Vibegron 75 MG TABS Take 1 tablet by mouth daily.    No facility-administered encounter medications on file as of 07/10/2022.   Recent Relevant Labs: Lab Results  Component Value Date/Time   HGBA1C 6.6 (A) 06/16/2022 08:33 AM   HGBA1C 7.3 (H) 12/08/2021 09:44 AM   HGBA1C 8.1 (A) 08/15/2021 10:25 AM   HGBA1C 6.9 (H) 01/03/2021 08:44 AM   HGBA1C 7.0 11/29/2019 12:00 AM   MICROALBUR <0.7 07/03/2020 09:08 AM   MICROALBUR 0.9 01/25/2019 11:49 AM    Kidney Function Lab Results  Component Value Date/Time   CREATININE 1.27 (H) 06/12/2022 04:08 PM   CREATININE 1.03 12/08/2021 09:44 AM   CREATININE 1.07 (H) 10/03/2020 08:54 AM   GFR 50.24 (L) 12/08/2021 09:44 AM  GFRNONAA 42 (L) 06/12/2022 04:08 PM   GFRNONAA 48 (L) 10/03/2020 08:54 AM   GFRAA 56 (L) 10/03/2020 08:54 AM    Current antihyperglycemic regimen:  Farxiga 5 mg daily  What recent interventions/DTPs have been made to improve glycemic control:  No recent interventions or DTPs.  Have there been any recent hospitalizations or ED visits since last visit with CPP? No  Patient denies hypoglycemic symptoms.  Patient denies hyperglycemic symptoms.  How often are you checking your blood sugar? Once a month  What are your blood sugars ranging?  Patient states she does not remember her sugars. However she reports them as being "good' and "within normal range."  During  the week, how often does your blood glucose drop below 70? Never  Are you checking your feet daily/regularly? Yes, patient wears compression stockings daily.  Adherence Review: Is the patient currently on a STATIN medication? Yes Is the patient currently on ACE/ARB medication? Yes Does the patient have >5 day gap between last estimated fill dates? No   Care Gaps: Medicare Annual Wellness: Completed 03/16/2022 Ophthalmology Exam: Overdue since 12/27/2021 Foot Exam: Overdue since 10/31/2020 Hemoglobin A1C: 6.6% on 06/16/2022 Colonoscopy: Aged out Dexa Scan: Completed Mammogram: Next due 09/15/2022  Future Appointments  Date Time Provider [place]  08/07/2022  3:10 PM Almyra Deforest, [place] [facility]  09/07/2022  9:20 AM Jaquita Folds, MD [facility]  12/07/2022  2:00 PM [facility] CCM PHARMACIST [facility]  03/26/2023  1:00 PM [facility] HEALTH COACH [facility]   Star Rating Drugs: Lovastatin 20 mg last filled 04/10/2022 90 DS Farxiga 5 mg last filled 05/08/2022 90 DS Losartan 100 mg last filled 01/02/2022 90 DS  April D Calhoun, [place] Pharmacist Assistant [phone]

## 2022-07-15 DIAGNOSIS — R000 Tachycardia, unspecified: Secondary | ICD-10-CM | POA: Diagnosis not present

## 2022-07-17 DIAGNOSIS — H4042X2 Glaucoma secondary to eye inflammation, left eye, moderate stage: Secondary | ICD-10-CM | POA: Diagnosis not present

## 2022-07-17 DIAGNOSIS — E119 Type 2 diabetes mellitus without complications: Secondary | ICD-10-CM | POA: Diagnosis not present

## 2022-07-17 DIAGNOSIS — H35033 Hypertensive retinopathy, bilateral: Secondary | ICD-10-CM | POA: Diagnosis not present

## 2022-07-17 DIAGNOSIS — H209 Unspecified iridocyclitis: Secondary | ICD-10-CM | POA: Diagnosis not present

## 2022-07-17 DIAGNOSIS — Z961 Presence of intraocular lens: Secondary | ICD-10-CM | POA: Diagnosis not present

## 2022-07-27 ENCOUNTER — Other Ambulatory Visit: Payer: Self-pay

## 2022-07-27 MED ORDER — CLOPIDOGREL BISULFATE 75 MG PO TABS: 75.0000 mg | ORAL_TABLET | Freq: Every day | ORAL | 3 refills | Status: DC

## 2022-08-03 ENCOUNTER — Other Ambulatory Visit: Payer: Self-pay | Admitting: Physician Assistant

## 2022-08-06 DIAGNOSIS — M0600 Rheumatoid arthritis without rheumatoid factor, unspecified site: Secondary | ICD-10-CM | POA: Diagnosis not present

## 2022-08-07 ENCOUNTER — Ambulatory Visit (INDEPENDENT_AMBULATORY_CARE_PROVIDER_SITE_OTHER): Payer: Medicare HMO | Admitting: Physician Assistant

## 2022-08-07 ENCOUNTER — Encounter: Payer: Self-pay | Admitting: Physician Assistant

## 2022-08-07 VITALS — BP 98/54 | HR 96 | Ht 62.0 in | Wt 160.6 lb

## 2022-08-07 DIAGNOSIS — E785 Hyperlipidemia, unspecified: Secondary | ICD-10-CM

## 2022-08-07 DIAGNOSIS — Z8673 Personal history of transient ischemic attack (TIA), and cerebral infarction without residual deficits: Secondary | ICD-10-CM | POA: Diagnosis not present

## 2022-08-07 DIAGNOSIS — I10 Essential (primary) hypertension: Secondary | ICD-10-CM

## 2022-08-07 DIAGNOSIS — R002 Palpitations: Secondary | ICD-10-CM | POA: Diagnosis not present

## 2022-08-07 DIAGNOSIS — E119 Type 2 diabetes mellitus without complications: Secondary | ICD-10-CM

## 2022-08-07 MED ORDER — LOSARTAN POTASSIUM 100 MG PO TABS: 50.0000 mg | ORAL_TABLET | Freq: Every day | ORAL | 3 refills | Status: DC

## 2022-08-07 NOTE — Progress Notes (Unsigned)
Cardiology Office Note:    Date:  08/09/2022   ID:  Cheryl Wood, DOB [birth year], MRN [id number]  PCP:  Allwardt, Randa Evens, [place] Providers Cardiologist:  Quay Burow, MD     Referring MD: Fredirick Lathe, PA-C   Chief Complaint  Patient presents with   Follow-up    Seen for Dr. Gwenlyn Found    History of Present Illness:    Cheryl Wood is a 86 y.o. 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  Patient was previously followed by Dr. Tollie Eth who last saw her in May 2019.  Patient has never had a heart attack.  Patient was initially referred to [place] for evaluation of dyspnea on exertion.  She denied any chest pain.  Echocardiogram performed on 03/28/2021 showed normal EF, grade 1 DD, mild to moderate AI.  Left and right heart cath performed by Dr. Tamala Julian revealed minimal CAD with wedge pressure of 10 and LVEDP of 23.   More recently, patient presented to [facility] for evaluation of elevated heart rate in the low blood pressure that occurred during infusion at rheumatology office.  Telephone visit suggested blood pressure was as low was 70/50, and heart rate as high as 300.  Based on sticky note from her rheumatology office, blood pressure was 91/51, heart rate 152. BP 72/60, heart rate of 240.  Blood pressure 90/62, heart rate 301.  The event occurred more than 24 hours prior to her ED arrival.  Lab work in the ED were normal.  EKG shows sinus rhythm.  I last saw the patient on 06/23/2022 at which time she denied any cardiac awareness of the recent tachycardia episodes.  The only thing she experienced was blurry vision.  I recommended a 2-week heart monitor.  TSH was normal.  We went over today's heart monitor report, given the transient nature of her tachycardic episodes, I did not recommend any beta-blocker or calcium channel blocker.  Her blood pressure is  borderline low, talking with the patient, she has had several episodes of really low blood pressure, I will reduce losartan to 50 mg daily.   Past Medical History:  Diagnosis Date   Allergy    Arthritis    Bleeding of blood vessel    ext. genitalia area   Blood in stool    Chronic kidney disease    Depression    Glaucoma    Heart murmur    History of chicken pox    History of recurrent UTIs    History of stomach ulcers    Hyperlipidemia    Hypertension    Memory loss    Rheumatic fever    Seizures ([place])    Stroke ([place])    1287; embolic   TIA (transient ischemic attack) 10/22/2013   Urine incontinence     Past Surgical History:  Procedure Laterality Date   ABDOMINAL HYSTERECTOMY     APPENDECTOMY  1983   CATARACT EXTRACTION Right May 13, 2014   EYE SURGERY  12/13/2018   stent inserted in left eye   gum transplant     RIGHT/LEFT HEART CATH AND CORONARY ANGIOGRAPHY N/A 05/12/2018   Procedure: RIGHT/LEFT HEART CATH AND CORONARY ANGIOGRAPHY;  Surgeon: Belva Crome, MD;  Location: [facility];  Service: Cardiovascular;  Laterality: N/A;   THORACIC AORTOGRAM N/A 05/12/2018   Procedure: THORACIC AORTOGRAM;  Surgeon: Daneen Schick  W, MD;  Location: [facility];  Service: Cardiovascular;  Laterality: N/A;   TONSILLECTOMY      Current Medications: Current Meds  Medication Sig   acetaminophen (TYLENOL) 650 MG CR tablet Take 650 mg by mouth every 8 (eight) hours as needed for pain.   brimonidine (ALPHAGAN) 0.2 % ophthalmic solution Place 1 drop into both eyes 2 (two) times daily.   clopidogrel (PLAVIX) 75 MG tablet Take 1 tablet (75 mg total) by mouth daily.   estradiol (ESTRACE) 0.1 MG/GM vaginal cream Place 0.5 g vaginally 2 (two) times a week. Place 0.5g twice a week   famciclovir (FAMVIR) 500 MG tablet Take 500 mg by mouth daily.    famotidine (PEPCID) 20 MG tablet Take 1 tablet (20 mg total) by mouth 2 (two) times daily.   FARXIGA 5 MG TABS tablet TAKE 1 TABLET  BY MOUTH DAILY BEFORE BREAKFAST   furosemide (LASIX) 80 MG tablet Take 2 tablets (160 mg total) by mouth daily.   glucose blood (CONTOUR NEXT TEST) test strip Use as instructed   golimumab (SIMPONI ARIA) 50 MG/4ML SOLN injection Inject into the vein. Every 8 weeks   Lancets MISC Check blood sugar once daily. Contour Next Device.   loratadine (CLARITIN) 10 MG tablet Take 10 mg by mouth daily.   lovastatin (MEVACOR) 20 MG tablet TAKE ONE TABLET BY MOUTH EVERY EVENING   Multiple Vitamins-Minerals (CENTRUM SILVER 50+WOMEN PO) Take 1 tablet by mouth daily.   mycophenolate (CELLCEPT) 500 MG tablet Take 500 mg by mouth 2 (two) times daily.   NON FORMULARY Take 900 mg by mouth in the morning.   potassium chloride SA (KLOR-CON M20) 20 MEQ tablet Take 1 tablet (20 mEq total) by mouth daily.   pramipexole (MIRAPEX) 0.25 MG tablet Take 1 tablet (0.25 mg total) by mouth at bedtime.   prednisoLONE acetate (PRED FORTE) 1 % ophthalmic suspension Place 1 drop into both eyes in the morning and at bedtime.   sertraline (ZOLOFT) 25 MG tablet TAKE ONE TABLET BY MOUTH DAILY   timolol (BETIMOL) 0.5 % ophthalmic solution Place 1 drop into the right eye 2 (two) times daily.   Vibegron 75 MG TABS Take 1 tablet by mouth daily.   [DISCONTINUED] losartan (COZAAR) 100 MG tablet Take 1 tablet (100 mg total) by mouth daily.     Allergies:   Dorzolamide hcl-timolol mal, Tetanus toxoids, and Hctz [hydrochlorothiazide]   Social History   Socioeconomic History   Marital status: Divorced    Spouse name: Not on file   Number of children: 2   Years of education: college   Highest education level: Not on file  Occupational History    Employer: RETIRED  Tobacco Use   Smoking status: Former    Years: 40.00    Types: Cigarettes    Quit date: 11/05/1971    Years since quitting: 50.7   Smokeless tobacco: Never  Vaping Use   Vaping Use: Never used  Substance and Sexual Activity   Alcohol use: No    Alcohol/week: 0.0  standard drinks of alcohol   Drug use: No   Sexual activity: Not Currently  Other Topics Concern   Not on file  Social History Narrative   Patient lives at home alone   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   Worked at [employer] for 18 years   Daughters in [place] and in [place]   Caffeine Use: 2-3 cups daily  Social Determinants of Health   Financial Resource Strain: Low Risk  (03/16/2022)   Overall Financial Resource Strain (CARDIA)    Difficulty of Paying Living Expenses: Not hard at all  Food Insecurity: No Food Insecurity (03/16/2022)   Hunger Vital Sign    Worried About Running Out of Food in the Last Year: Never true    Ran Out of Food in the Last Year: Never true  Transportation Needs: No Transportation Needs (03/16/2022)   PRAPARE - Hydrologist (Medical): No    Lack of Transportation (Non-Medical): No  Physical Activity: Inactive (03/16/2022)   Exercise Vital Sign    Days of Exercise per Week: 0 days    Minutes of Exercise per Session: 0 min  Stress: No Stress Concern Present (03/16/2022)   [place]    Feeling of Stress : Not at all  Social Connections: Moderately Isolated (03/16/2022)   Social Connection and Isolation Panel [NHANES]    Frequency of Communication with Friends and Family: More than three times a week    Frequency of Social Gatherings with Friends and Family: More than three times a week    Attends Religious Services: 1 to 4 times per year    Active Member of [employer] or Organizations: No    Attends Archivist Meetings: Never    Marital Status: Divorced     Family History: The patient's family history includes Alcohol abuse in her maternal grandfather, paternal grandfather, and sister; Arthritis in her brother, father, paternal grandmother, and sister; COPD in her sister; Cancer in her mother and sister; Diabetes in  her brother and daughter; Drug abuse in her sister; Early death in her brother, brother, father, maternal grandfather, mother, and sister; Hearing loss in her father; Heart attack in her brother; Heart disease in her brother, daughter, father, paternal grandfather, and sister; Hyperlipidemia in her brother, daughter, father, maternal grandmother, paternal grandfather, and sister; Hypertension in her brother, father, maternal grandmother, paternal grandfather, and sister; Stroke in her maternal grandmother and paternal grandfather.  ROS:   Please see the history of present illness.     All other systems reviewed and are negative.  EKGs/Labs/Other Studies Reviewed:    The following studies were reviewed today:  Echo 03/28/2021 1. Left ventricular ejection fraction, by estimation, is 60 to 65%. The  left ventricle has normal function. The left ventricle has no regional  wall motion abnormalities. Left ventricular diastolic parameters are  consistent with Grade I diastolic  dysfunction (impaired relaxation).   2. Right ventricular systolic function is normal. The right ventricular  size is normal. There is normal pulmonary artery systolic pressure.   3. The mitral valve is normal in structure. No evidence of mitral valve  regurgitation. No evidence of mitral stenosis.   4. The aortic valve is calcified. There is moderate calcification of the  aortic valve. There is moderate thickening of the aortic valve. Aortic  valve regurgitation is mild to moderate. No aortic stenosis is present.   5. The inferior vena cava is normal in size with greater than 50%  respiratory variability, suggesting right atrial pressure of 3 mmHg.   Conclusion(s)/Recommendation(s): No intracardiac source of embolism  detected on this transthoracic study. A transesophageal echocardiogram is  recommended to exclude cardiac source of embolism if clinically indicated.   EKG:  EKG is not ordered today.    Recent  Labs: 08/28/2021: B Natriuretic Peptide 97.8 12/08/2021: ALT 14 06/12/2022:  BUN 29; Creatinine, Ser 1.27; Hemoglobin 14.3; Platelets 176; Potassium 3.6; Sodium 137 06/23/2022: TSH 0.975  Recent Lipid Panel    Component Value Date/Time   CHOL 189 12/08/2021 0944   TRIG 169.0 (H) 12/08/2021 0944   HDL 52.50 12/08/2021 0944   CHOLHDL 4 12/08/2021 0944   VLDL 33.8 12/08/2021 0944   LDLCALC 102 (H) 12/08/2021 0944   LDLDIRECT 136.0 01/25/2019 1149     Risk Assessment/Calculations:           Physical Exam:    VS:  BP (!) 98/54 (BP Location: Left Arm, Patient Position: Sitting, Cuff Size: Normal)   Pulse 96   Ht '5\' 2"'$  (1.575 m)   Wt 160 lb 9.6 oz (72.8 kg)   LMP  (LMP Unknown)   SpO2 96%   BMI 29.37 kg/m        Wt Readings from Last 3 Encounters:  08/07/22 160 lb 9.6 oz (72.8 kg)  06/23/22 158 lb 9.6 oz (71.9 kg)  06/16/22 160 lb (72.6 kg)     GEN:  Well nourished, well developed in no acute distress HEENT: Normal NECK: No JVD; No carotid bruits LYMPHATICS: No lymphadenopathy CARDIAC: RRR, no murmurs, rubs, gallops RESPIRATORY:  Clear to auscultation without rales, wheezing or rhonchi  ABDOMEN: Soft, non-tender, non-distended MUSCULOSKELETAL:  No edema; No deformity  SKIN: Warm and dry NEUROLOGIC:  Alert and oriented x 3 PSYCHIATRIC:  Normal affect   ASSESSMENT:    1. Primary hypertension   2. Hyperlipidemia LDL goal <70   3. Controlled type 2 diabetes mellitus without complication, without long-term current use of insulin (HCC)   4. Palpitations   5. H/O: CVA (cerebrovascular accident)    PLAN:    In order of problems listed above:  Hypertension: Patient is actually hypotensive today.  This is confirmed on manual recheck by myself.  I decided to reduce losartan to 50 mg daily.  Hyperlipidemia: On lovastatin  DM2: Managed by primary care provider  Palpitation: Recent heart monitor showed transient SVT bursts, the onset of fast burst does not correlate with  her symptom.  At this time, I recommended continue monitoring  History of CVA: On Plavix            Medication Adjustments/Labs and Tests Ordered: Current medicines are reviewed at length with the patient today.  Concerns regarding medicines are outlined above.  No orders of the defined types were placed in this encounter.  Meds ordered this encounter  Medications   losartan (COZAAR) 100 MG tablet    Sig: Take 0.5 tablets (50 mg total) by mouth daily.    Dispense:  45 tablet    Refill:  3    Dose change, new Rx    Patient Instructions  Medication Instructions:  DECREASE Losartan to 50 mg daily   *If you need a refill on your cardiac medications before your next appointment, please call your pharmacy*  Lab Work: NONE ordered at this time of appointment   If you have labs (blood work) drawn today and your tests are completely normal, you will receive your results only by: MyChart Message (if you have MyChart) OR A paper copy in the mail If you have any lab test that is abnormal or we need to change your treatment, we will call you to review the results.  Testing/Procedures: NONE ordered at this time of appointment   Follow-Up: At [facility], you and your health needs are our priority.  As part of our continuing mission to  provide you with exceptional heart care, we have created designated Provider Care Teams.  These Care Teams include your primary Cardiologist (physician) and Advanced Practice Providers (APPs -  Physician Assistants and Nurse Practitioners) who all work together to provide you with the care you need, when you need it.  We recommend signing up for the patient portal called "MyChart".  Sign up information is provided on this After Visit Summary.  MyChart is used to connect with patients for Virtual Visits (Telemedicine).  Patients are able to view lab/test results, encounter notes, upcoming appointments, etc.  Non-urgent messages can be sent to your  provider as well.   To learn more about what you can do with MyChart, go to [email or web address].    Your next appointment:   4-5 month(s)  The format for your next appointment:   In Person  Provider:   Quay Burow, MD     Other Instructions   Important Information About Sugar         Hilbert Corrigan, [place]  08/09/2022 11:43 PM    [place]

## 2022-08-07 NOTE — Patient Instructions (Addendum)
Medication Instructions:  DECREASE Losartan to 50 mg daily   *If you need a refill on your cardiac medications before your next appointment, please call your pharmacy*  Lab Work: NONE ordered at this time of appointment   If you have labs (blood work) drawn today and your tests are completely normal, you will receive your results only by: [place] (if you have MyChart) OR A paper copy in the mail If you have any lab test that is abnormal or we need to change your treatment, we will call you to review the results.  Testing/Procedures: NONE ordered at this time of appointment   Follow-Up: At [facility], you and your health needs are our priority.  As part of our continuing mission to provide you with exceptional heart care, we have created designated Provider Care Teams.  These Care Teams include your primary Cardiologist (physician) and Advanced Practice Providers (APPs -  Physician Assistants and Nurse Practitioners) who all work together to provide you with the care you need, when you need it.  We recommend signing up for the patient portal called "MyChart".  Sign up information is provided on this After Visit Summary.  MyChart is used to connect with patients for Virtual Visits (Telemedicine).  Patients are able to view lab/test results, encounter notes, upcoming appointments, etc.  Non-urgent messages can be sent to your provider as well.   To learn more about what you can do with MyChart, go to [email or web address].    Your next appointment:   4-5 month(s)  The format for your next appointment:   In Person  Provider:   Quay Burow, MD     Other Instructions   Important Information About Sugar

## 2022-08-08 ENCOUNTER — Other Ambulatory Visit: Payer: Self-pay | Admitting: Physician Assistant

## 2022-08-09 ENCOUNTER — Encounter: Payer: Self-pay | Admitting: Physician Assistant

## 2022-08-11 ENCOUNTER — Telehealth: Payer: Self-pay

## 2022-08-11 NOTE — Telephone Encounter (Addendum)
Left voice message for patient to return call for monitor results. Will try calling again.  ----- Message from Almyra Deforest, [place] sent at 07/30/2022  4:46 PM EDT ----- Although Mrs. Hollingshead has frequent transient bursts of fast beats, nothing lasted very long, longest duration was 28 seconds. None of the fast bursts were patient's triggered events, therefore I suspect she was asymptomatic during those episodes. No dangerous rhythm seen. Unless she is symptomatic, no specific treatment necessary.

## 2022-08-17 ENCOUNTER — Telehealth: Payer: Self-pay | Admitting: Pharmacist

## 2022-08-17 NOTE — Progress Notes (Signed)
Chronic Care Management Pharmacy Assistant   Name: Cheryl Wood  MRN: [id number] DOB: 03-21-1936   Reason for Encounter: Hypertension Adherence Call    Recent office visits:  06/16/2022 OV (PCP) Allwardt, [name] M, PA-C; no medication changes indicated.  Recent consult visits:  08/07/2022 OV (Cardiology) Almyra Deforest, [place]; Hypertension: Patient is actually hypotensive today.  This is confirmed on manual recheck by myself.  I decided to reduce losartan to 50 mg daily.  07/17/2022 OV (Ophthalmology) Dala Dock, MD;  - Discussed starting MTX and discussed r/b.  - D/c 10 mg weekly MTX with 1 mg folic acid daily on 07/01/11 - not tolerating MTX due to nausea and sore throat  - D/c Azathioprine 25 mg daily (due to nausea)  - Prescribed Zofran for nausea - Tapered off 5 mg oral Prednisone daily  06/23/2022 OV (Cardiology) Almyra Deforest, PA; no medication changes noted.  Hospital visits:  06/12/2022 ED visit for abnormal hear rate -No medication changes indicated.  Medications: Outpatient Encounter Medications as of 08/17/2022  Medication Sig Note   acetaminophen (TYLENOL) 650 MG CR tablet Take 650 mg by mouth every 8 (eight) hours as needed for pain.    amoxicillin (AMOXIL) 500 MG capsule Take 500 mg by mouth. Take 4 capsules before dental work (Patient not taking: Reported on 08/07/2022)    b complex vitamins capsule Take 1 capsule by mouth daily. (Patient not taking: Reported on 08/07/2022)    brimonidine (ALPHAGAN) 0.2 % ophthalmic solution Place 1 drop into both eyes 2 (two) times daily.    clopidogrel (PLAVIX) 75 MG tablet Take 1 tablet (75 mg total) by mouth daily.    estradiol (ESTRACE) 0.1 MG/GM vaginal cream Place 0.5 g vaginally 2 (two) times a week. Place 0.5g twice a week    famciclovir (FAMVIR) 500 MG tablet Take 500 mg by mouth daily.     famotidine (PEPCID) 20 MG tablet Take 1 tablet (20 mg total) by mouth 2 (two) times daily.    FARXIGA 5 MG TABS tablet TAKE 1 TABLET BY  MOUTH DAILY BEFORE BREAKFAST    furosemide (LASIX) 80 MG tablet Take 2 tablets (160 mg total) by mouth daily.    glucose blood (CONTOUR NEXT TEST) test strip Use as instructed    golimumab (SIMPONI ARIA) 50 MG/4ML SOLN injection Inject into the vein. Every 8 weeks    Lancets MISC Check blood sugar once daily. Contour Next Device.    loratadine (CLARITIN) 10 MG tablet Take 10 mg by mouth daily.    losartan (COZAAR) 100 MG tablet Take 0.5 tablets (50 mg total) by mouth daily.    lovastatin (MEVACOR) 20 MG tablet TAKE ONE TABLET BY MOUTH EVERY EVENING    Multiple Vitamins-Minerals (CENTRUM SILVER 50+WOMEN PO) Take 1 tablet by mouth daily.    mycophenolate (CELLCEPT) 500 MG tablet Take 500 mg by mouth 2 (two) times daily. 06/23/2022: Patient takes 500 mg daily   NON FORMULARY Take 900 mg by mouth in the morning.    potassium chloride SA (KLOR-CON M20) 20 MEQ tablet Take 1 tablet (20 mEq total) by mouth daily.    pramipexole (MIRAPEX) 0.25 MG tablet TAKE ONE TABLET BY MOUTH AT BEDTIME    prednisoLONE acetate (PRED FORTE) 1 % ophthalmic suspension Place 1 drop into both eyes in the morning and at bedtime.    sertraline (ZOLOFT) 25 MG tablet TAKE ONE TABLET BY MOUTH DAILY    timolol (BETIMOL) 0.5 % ophthalmic solution Place 1 drop into  the right eye 2 (two) times daily.    Vibegron 75 MG TABS Take 1 tablet by mouth daily.    No facility-administered encounter medications on file as of 08/17/2022.   Reviewed chart prior to disease state call. Spoke with patient regarding BP  Recent Office Vitals: BP Readings from Last 3 Encounters:  08/07/22 (!) 98/54  06/23/22 132/74  06/16/22 117/68   Pulse Readings from Last 3 Encounters:  08/07/22 96  06/23/22 62  06/16/22 63    Wt Readings from Last 3 Encounters:  08/07/22 160 lb 9.6 oz (72.8 kg)  06/23/22 158 lb 9.6 oz (71.9 kg)  06/16/22 160 lb (72.6 kg)     Kidney Function Lab Results  Component Value Date/Time   CREATININE 1.27 (H) 06/12/2022  04:08 PM   CREATININE 1.03 12/08/2021 09:44 AM   CREATININE 1.07 (H) 10/03/2020 08:54 AM   GFR 50.24 (L) 12/08/2021 09:44 AM   GFRNONAA 42 (L) 06/12/2022 04:08 PM   GFRNONAA 48 (L) 10/03/2020 08:54 AM   GFRAA 56 (L) 10/03/2020 08:54 AM       Latest Ref Rng & Units 06/12/2022    4:08 PM 12/08/2021    9:44 AM 08/28/2021   10:11 AM  BMP  Glucose 70 - 99 mg/dL 114  121  209   BUN 8 - 23 mg/dL '29  20  24   '$ Creatinine 0.44 - 1.00 mg/dL 1.27  1.03  1.06   Sodium 135 - 145 mmol/L 137  143  141   Potassium 3.5 - 5.1 mmol/L 3.6  3.8  3.6   Chloride 98 - 111 mmol/L 104  105  106   CO2 22 - 32 mmol/L '23  29  26   '$ Calcium 8.9 - 10.3 mg/dL 10.4  9.9  9.1     Current antihypertensive regimen:  Furosemide 160 mg daily Losartan 50 mg daily  How often are you checking your Blood Pressure? 1-2x per week  Current home BP readings: 120's/80's  What recent interventions/DTPs have been made by any provider to improve Blood Pressure control since last CPP Visit: Losartan decreased from 100 mg to 50 mg daily.  Any recent hospitalizations or ED visits since last visit with CPP? No  What diet changes have been made to improve Blood Pressure Control?  Patient states she has lost 25 lbs since starting [place].  What exercise is being done to improve your Blood Pressure Control?  Patient states she "stays busy all the time". She states she still works at an [employer].  Adherence Review: Is the patient currently on ACE/ARB medication? Yes Does the patient have >5 day gap between last estimated fill dates? No  Care Gaps: Medicare Annual Wellness: Completed 03/16/2022 Ophthalmology Exam: Overdue since 12/27/2021 Foot Exam: Overdue since 10/31/2020 Hemoglobin A1C: 6.6% on 06/16/2022 Colonoscopy: Aged out Dexa Scan: Completed Mammogram: Next due on 09/15/2022  Future Appointments  Date Time Provider [place]  09/07/2022  9:20 AM Jaquita Folds, MD [facility]  12/07/2022  2:00  PM [facility] CCM PHARMACIST [facility]  12/09/2022  9:30 AM Lorretta Harp, MD [facility] None  03/26/2023  1:00 PM [facility] HEALTH COACH [facility]   Star Rating Drugs: Farxiga 5 mg last filled 08/04/2022 90 DS Losartan 100 mg last filled 08/07/2022 90 DS Lovastatin 20 mg last filled 07/10/2022 90 DS  April D Calhoun, [place] Pharmacist Assistant [phone]

## 2022-08-30 ENCOUNTER — Other Ambulatory Visit: Payer: Self-pay | Admitting: Obstetrics and Gynecology

## 2022-08-30 DIAGNOSIS — N3281 Overactive bladder: Secondary | ICD-10-CM

## 2022-09-05 ENCOUNTER — Other Ambulatory Visit: Payer: Self-pay | Admitting: Physician Assistant

## 2022-09-07 ENCOUNTER — Encounter: Payer: Self-pay | Admitting: Obstetrics and Gynecology

## 2022-09-07 ENCOUNTER — Ambulatory Visit (INDEPENDENT_AMBULATORY_CARE_PROVIDER_SITE_OTHER): Payer: Medicare HMO | Admitting: Obstetrics and Gynecology

## 2022-09-07 VITALS — BP 134/78 | HR 80

## 2022-09-07 DIAGNOSIS — N3281 Overactive bladder: Secondary | ICD-10-CM

## 2022-09-07 DIAGNOSIS — N393 Stress incontinence (female) (male): Secondary | ICD-10-CM | POA: Diagnosis not present

## 2022-09-07 NOTE — Progress Notes (Signed)
Urogynecology   Subjective:     Chief Complaint:  Chief Complaint  Patient presents with   Pessary Check    History of Present Illness: Cheryl Wood is a 86 y.o. female with stress incontinence and OAB who presents for a pessary check. She is using a size #0 incontinence ring pessary. Has noticed some spotting on her pad. Using the vaginal estrogen cream.   On Gemtesa '75mg'$  for overactive bladder and has been working well.   Past Medical History: Patient  has a past medical history of Allergy, Arthritis, Bleeding of blood vessel, Blood in stool, Chronic kidney disease, Depression, Glaucoma, Heart murmur, History of chicken pox, History of recurrent UTIs, History of stomach ulcers, Hyperlipidemia, Hypertension, Memory loss, Rheumatic fever, Seizures ([place]), Stroke ([place]), TIA (transient ischemic attack) (10/22/2013), and Urine incontinence.   Past Surgical History: She  has a past surgical history that includes Abdominal hysterectomy; gum transplant; Cataract extraction (Right, May 13, 2014); RIGHT/LEFT HEART CATH AND CORONARY ANGIOGRAPHY (N/A, 05/12/2018); THORACIC AORTOGRAM (N/A, 05/12/2018); Eye surgery (12/13/2018); Appendectomy (1983); and Tonsillectomy.   Medications: She has a current medication list which includes the following prescription(s): acetaminophen, amoxicillin, b complex vitamins, brimonidine, clopidogrel, estradiol, famciclovir, famotidine, farxiga, furosemide, gemtesa, contour next test, simponi aria, lancets, loratadine, losartan, lovastatin, multiple vitamins-minerals, mycophenolate, NON FORMULARY, potassium chloride sa, pramipexole, prednisolone acetate, sertraline, and timolol.   Allergies: Patient is allergic to dorzolamide hcl-timolol mal, tetanus toxoids, and hctz [hydrochlorothiazide].   Social History: Patient  reports that she quit smoking about 50 years ago. Her smoking use included cigarettes. She has never used smokeless tobacco. She reports  that she does not drink alcohol and does not use drugs.      Objective:    Physical Exam: BP 134/78   Pulse 80   LMP  (LMP Unknown)  Gen: No apparent distress, A&O x 3.  Detailed Urogynecologic Evaluation:  Pelvic Exam: Normal external female genitalia; Bartholin's and Skene's glands normal in appearance; urethral meatus normal in appearance, no urethral masses or discharge. The pessary was noted to be in place. It was removed and cleaned. Speculum exam revealed small granulation tissue on the anterior vagina, treated with silver nitrate. Hemostasis was noted. The pessary was replaced. It was comfortable to the patient and fit well.   POP-Q (03/26/21):    POP-Q   -2                                            Aa   -2                                           Ba   -6                                              C    1.5                                            Gh   3  Pb   6.5                                            tvl    -1.5                                            Ap   -1.5                                            Bp                                                  D         Assessment/Plan:    Assessment: Cheryl Wood is a 86 y.o. with stress incontinence and OAB here for a pessary check.   Plan:  SUI - Continue #0 incontinence ring pessary.  - Continue estrogen cream twice a week.   OAB - Continue with Gemtesa '75mg'$  daily.   Follow up 3 months.   Time spent: I spent 20 minutes dedicated to the care of this patient on the date of this encounter to include pre-visit review of records, face-to-face time with the patient and post visit documentation.    Jaquita Folds, MD

## 2022-09-21 DIAGNOSIS — Z0100 Encounter for examination of eyes and vision without abnormal findings: Secondary | ICD-10-CM | POA: Diagnosis not present

## 2022-09-28 ENCOUNTER — Other Ambulatory Visit: Payer: Self-pay | Admitting: Physician Assistant

## 2022-09-28 DIAGNOSIS — Z1231 Encounter for screening mammogram for malignant neoplasm of breast: Secondary | ICD-10-CM

## 2022-09-29 ENCOUNTER — Telehealth: Payer: Self-pay | Admitting: Pharmacist

## 2022-09-29 NOTE — Progress Notes (Signed)
Chronic Care Management Pharmacy Assistant   Name: Cheryl Wood  MRN: [id number] DOB: 12/06/1936   Reason for Encounter: Diabetes Adherence Call    Recent office visits:  None  Recent consult visits:  09/07/2022 [facility] (Urogyn) Jaquita Folds, MD; no medication changes noted.  Hospital visits:  None in previous 6 months  Medications: Outpatient Encounter Medications as of 09/29/2022  Medication Sig Note   acetaminophen (TYLENOL) 650 MG CR tablet Take 650 mg by mouth every 8 (eight) hours as needed for pain.    amoxicillin (AMOXIL) 500 MG capsule Take 500 mg by mouth. Take 4 capsules before dental work    b complex vitamins capsule Take 1 capsule by mouth daily.    brimonidine (ALPHAGAN) 0.2 % ophthalmic solution Place 1 drop into both eyes 2 (two) times daily.    clopidogrel (PLAVIX) 75 MG tablet Take 1 tablet (75 mg total) by mouth daily.    estradiol (ESTRACE) 0.1 MG/GM vaginal cream Place 0.5 g vaginally 2 (two) times a week. Place 0.5g twice a week    famciclovir (FAMVIR) 500 MG tablet Take 500 mg by mouth daily.     famotidine (PEPCID) 20 MG tablet TAKE ONE TABLET BY MOUTH TWICE A DAY    FARXIGA 5 MG TABS tablet TAKE 1 TABLET BY MOUTH DAILY BEFORE BREAKFAST    furosemide (LASIX) 80 MG tablet Take 2 tablets (160 mg total) by mouth daily.    GEMTESA 75 MG TABS TAKE ONE TABLET BY MOUTH DAILY    glucose blood (CONTOUR NEXT TEST) test strip Use as instructed    golimumab (SIMPONI ARIA) 50 MG/4ML SOLN injection Inject into the vein. Every 8 weeks    Lancets MISC Check blood sugar once daily. Contour Next Device.    loratadine (CLARITIN) 10 MG tablet Take 10 mg by mouth daily.    losartan (COZAAR) 100 MG tablet Take 0.5 tablets (50 mg total) by mouth daily.    lovastatin (MEVACOR) 20 MG tablet TAKE ONE TABLET BY MOUTH EVERY EVENING    Multiple Vitamins-Minerals (CENTRUM SILVER 50+WOMEN PO) Take 1 tablet by mouth daily.    mycophenolate (CELLCEPT) 500 MG tablet Take 500 mg  by mouth 2 (two) times daily. 06/23/2022: Patient takes 500 mg daily   NON FORMULARY Take 900 mg by mouth in the morning.    potassium chloride SA (KLOR-CON M20) 20 MEQ tablet Take 1 tablet (20 mEq total) by mouth daily.    pramipexole (MIRAPEX) 0.25 MG tablet TAKE ONE TABLET BY MOUTH AT BEDTIME    prednisoLONE acetate (PRED FORTE) 1 % ophthalmic suspension Place 1 drop into both eyes in the morning and at bedtime.    sertraline (ZOLOFT) 25 MG tablet TAKE ONE TABLET BY MOUTH DAILY    timolol (BETIMOL) 0.5 % ophthalmic solution Place 1 drop into the right eye 2 (two) times daily.    No facility-administered encounter medications on file as of 09/29/2022.   Recent Relevant Labs: Lab Results  Component Value Date/Time   HGBA1C 6.6 (A) 06/16/2022 08:33 AM   HGBA1C 7.3 (H) 12/08/2021 09:44 AM   HGBA1C 8.1 (A) 08/15/2021 10:25 AM   HGBA1C 6.9 (H) 01/03/2021 08:44 AM   HGBA1C 7.0 11/29/2019 12:00 AM   MICROALBUR <0.7 07/03/2020 09:08 AM   MICROALBUR 0.9 01/25/2019 11:49 AM    Kidney Function Lab Results  Component Value Date/Time   CREATININE 1.27 (H) 06/12/2022 04:08 PM   CREATININE 1.03 12/08/2021 09:44 AM   CREATININE 1.07 (H) 10/03/2020  08:54 AM   GFR 50.24 (L) 12/08/2021 09:44 AM   GFRNONAA 42 (L) 06/12/2022 04:08 PM   GFRNONAA 48 (L) 10/03/2020 08:54 AM   GFRAA 56 (L) 10/03/2020 08:54 AM    Current antihyperglycemic regimen:  Farxiga 5 mg daily before breakfast  What recent interventions/DTPs have been made to improve glycemic control:  No recent interventions or DTPs.  Have there been any recent hospitalizations or ED visits since last visit with CPP? No  Patient denies hypoglycemic symptoms.  Patient denies hyperglycemic symptoms.  How often are you checking your blood sugar? once daily  What are your blood sugars ranging?  Patient states she doesn't have her blood sugar log with her during the time of this call. However, she reports them as being "good" or "within  normal range."  During the week, how often does your blood glucose drop below 70? Never  Are you checking your feet daily/regularly? Yes  Adherence Review: Is the patient currently on a STATIN medication? Yes Is the patient currently on ACE/ARB medication? Yes Does the patient have >5 day gap between last estimated fill dates? No  Care Gaps: Medicare Annual Wellness: Completed 03/16/2022 Ophthalmology Exam: Overdue since 12/27/2021 Foot Exam: Overdue since 10/31/2020 Hemoglobin A1C: 6.6% on 06/16/2022 Colonoscopy: Aged out Dexa Scan: Completed Mammogram: Next due on 09/15/2022  Future Appointments  Date Time Provider [place]  10/21/2022  4:40 PM [facility] MM 3 [facility]  12/07/2022  9:20 AM Jaquita Folds, MD [facility]  12/07/2022  2:00 PM [facility] CCM PHARMACIST [facility]  12/09/2022  9:30 AM Lorretta Harp, MD [facility] None  03/26/2023  1:00 PM [facility] HEALTH COACH [facility]   Star Rating Drugs: Lovastatin 20 mg last filled 07/10/2022 90 DS Farxiga 5 mg last filled 08/04/2022 90 DS Losartan 100 mg last filled 08/07/2022 90 DS  April D Calhoun, [place] Pharmacist Assistant [phone]

## 2022-09-30 DIAGNOSIS — R5383 Other fatigue: Secondary | ICD-10-CM | POA: Diagnosis not present

## 2022-09-30 DIAGNOSIS — Z111 Encounter for screening for respiratory tuberculosis: Secondary | ICD-10-CM | POA: Diagnosis not present

## 2022-09-30 DIAGNOSIS — M0600 Rheumatoid arthritis without rheumatoid factor, unspecified site: Secondary | ICD-10-CM | POA: Diagnosis not present

## 2022-09-30 DIAGNOSIS — Z79899 Other long term (current) drug therapy: Secondary | ICD-10-CM | POA: Diagnosis not present

## 2022-10-21 ENCOUNTER — Ambulatory Visit
Admission: RE | Admit: 2022-10-21 | Discharge: 2022-10-21 | Disposition: A | Discharge status: Home / Self Care | Payer: Medicare HMO | Source: Ambulatory Visit | Attending: Physician Assistant | Admitting: Physician Assistant

## 2022-10-21 DIAGNOSIS — Z1231 Encounter for screening mammogram for malignant neoplasm of breast: Secondary | ICD-10-CM

## 2022-10-30 ENCOUNTER — Other Ambulatory Visit: Payer: Self-pay | Admitting: Physician Assistant

## 2022-11-05 DIAGNOSIS — E663 Overweight: Secondary | ICD-10-CM | POA: Diagnosis not present

## 2022-11-05 DIAGNOSIS — M1991 Primary osteoarthritis, unspecified site: Secondary | ICD-10-CM | POA: Diagnosis not present

## 2022-11-05 DIAGNOSIS — H209 Unspecified iridocyclitis: Secondary | ICD-10-CM | POA: Diagnosis not present

## 2022-11-05 DIAGNOSIS — Z79899 Other long term (current) drug therapy: Secondary | ICD-10-CM | POA: Diagnosis not present

## 2022-11-05 DIAGNOSIS — M0600 Rheumatoid arthritis without rheumatoid factor, unspecified site: Secondary | ICD-10-CM | POA: Diagnosis not present

## 2022-11-05 DIAGNOSIS — Z6828 Body mass index (BMI) 28.0-28.9, adult: Secondary | ICD-10-CM | POA: Diagnosis not present

## 2022-11-06 ENCOUNTER — Other Ambulatory Visit: Payer: Self-pay | Admitting: Physician Assistant

## 2022-11-09 DIAGNOSIS — H4041X1 Glaucoma secondary to eye inflammation, right eye, mild stage: Secondary | ICD-10-CM | POA: Diagnosis not present

## 2022-11-09 DIAGNOSIS — H4042X2 Glaucoma secondary to eye inflammation, left eye, moderate stage: Secondary | ICD-10-CM | POA: Diagnosis not present

## 2022-11-23 ENCOUNTER — Other Ambulatory Visit: Payer: Self-pay

## 2022-11-23 MED ORDER — SERTRALINE HCL 25 MG PO TABS: 25.0000 mg | ORAL_TABLET | Freq: Every day | ORAL | 3 refills | Status: DC

## 2022-11-24 DIAGNOSIS — H4042X2 Glaucoma secondary to eye inflammation, left eye, moderate stage: Secondary | ICD-10-CM | POA: Diagnosis not present

## 2022-11-24 DIAGNOSIS — H35033 Hypertensive retinopathy, bilateral: Secondary | ICD-10-CM | POA: Diagnosis not present

## 2022-11-24 DIAGNOSIS — E119 Type 2 diabetes mellitus without complications: Secondary | ICD-10-CM | POA: Diagnosis not present

## 2022-11-24 DIAGNOSIS — H209 Unspecified iridocyclitis: Secondary | ICD-10-CM | POA: Diagnosis not present

## 2022-11-24 DIAGNOSIS — Z961 Presence of intraocular lens: Secondary | ICD-10-CM | POA: Diagnosis not present

## 2022-11-24 LAB — HM DIABETES EYE EXAM

## 2022-11-25 DIAGNOSIS — Z79899 Other long term (current) drug therapy: Secondary | ICD-10-CM | POA: Diagnosis not present

## 2022-11-25 DIAGNOSIS — M0600 Rheumatoid arthritis without rheumatoid factor, unspecified site: Secondary | ICD-10-CM | POA: Diagnosis not present

## 2022-12-07 ENCOUNTER — Telehealth: Payer: Medicare HMO

## 2022-12-07 ENCOUNTER — Ambulatory Visit (INDEPENDENT_AMBULATORY_CARE_PROVIDER_SITE_OTHER): Payer: Medicare HMO | Admitting: Obstetrics and Gynecology

## 2022-12-07 ENCOUNTER — Encounter: Payer: Self-pay | Admitting: Obstetrics and Gynecology

## 2022-12-07 VITALS — BP 107/73 | HR 71

## 2022-12-07 DIAGNOSIS — N3281 Overactive bladder: Secondary | ICD-10-CM | POA: Diagnosis not present

## 2022-12-07 DIAGNOSIS — H524 Presbyopia: Secondary | ICD-10-CM | POA: Diagnosis not present

## 2022-12-07 DIAGNOSIS — N393 Stress incontinence (female) (male): Secondary | ICD-10-CM

## 2022-12-07 DIAGNOSIS — N952 Postmenopausal atrophic vaginitis: Secondary | ICD-10-CM | POA: Diagnosis not present

## 2022-12-07 DIAGNOSIS — H5213 Myopia, bilateral: Secondary | ICD-10-CM | POA: Diagnosis not present

## 2022-12-07 NOTE — Progress Notes (Signed)
[facility]   Subjective:     Chief Complaint:  Chief Complaint  Patient presents with   Pessary Check   History of Present Illness: Cheryl Wood is a 86 y.o. female with stress incontinence and OAB who presents for a pessary check. She is using a size #0 incontinence ring pessary. The pessary has been working well and she has no complaints. She is using vaginal estrogen. She endorses some minimal vaginal bleeding.  Currently taking Gemtesa '75mg'$  daily for OAB and reports this is working well. Reports only one episode of incontinence that was related to her taking her fluid pill before going to a church function and waiting too long to use the bathroom.   Past Medical History: Patient  has a past medical history of Allergy, Arthritis, Bleeding of blood vessel, Blood in stool, Chronic kidney disease, Depression, Glaucoma, Heart murmur, History of chicken pox, History of recurrent UTIs, History of stomach ulcers, Hyperlipidemia, Hypertension, Memory loss, Rheumatic fever, Seizures ([name]), Stroke ([name]), TIA (transient ischemic attack) (10/22/2013), and Urine incontinence.   Past Surgical History: She  has a past surgical history that includes Abdominal hysterectomy; gum transplant; Cataract extraction (Right, May 13, 2014); RIGHT/LEFT HEART CATH AND CORONARY ANGIOGRAPHY (N/A, 05/12/2018); THORACIC AORTOGRAM (N/A, 05/12/2018); Eye surgery (12/13/2018); Appendectomy (1983); and Tonsillectomy.   Medications: She has a current medication list which includes the following prescription(s): acetaminophen, amoxicillin, b complex vitamins, brimonidine, clopidogrel, estradiol, famciclovir, famotidine, farxiga, furosemide, gemtesa, contour next test, simponi aria, klor-con m20, lancets, loratadine, losartan, lovastatin, multiple vitamins-minerals, mycophenolate, NON FORMULARY, pramipexole, prednisolone acetate, sertraline, and timolol.   Allergies: Patient is allergic to dorzolamide  hcl-timolol mal, tetanus toxoids, and hctz [hydrochlorothiazide].   Social History: Patient  reports that she quit smoking about 51 years ago. Her smoking use included cigarettes. She has never used smokeless tobacco. She reports that she does not drink alcohol and does not use drugs.      Objective:    Physical Exam: BP 107/73   Pulse 71   LMP  (LMP Unknown)  Gen: No apparent distress, A&O x 3. Detailed Urogynecologic Evaluation:  Pelvic Exam: Normal external female genitalia; Bartholin's and Skene's glands normal in appearance; urethral meatus normal in appearance, no urethral masses or discharge. The pessary was noted to be in place. It was removed and cleaned. Speculum exam revealed mild excoriation in the vagina. The pessary was replaced. It was comfortable to the patient and fit well.     Assessment/Plan:    Assessment: Cheryl Wood is a 86 y.o. with stress incontinence and OAB here for a pessary check. She is doing well.  Plan: She will keep the pessary in place until next visit. She will continue to use estrogen but will increase to 3 nights a week due to her continued small amounts of vaginal bleeding. She will also start to use some form of vaginal lubrication on the nights she is not using vaginal estrogen, we discussed coconut oil, vitamin e cream, or an over the counter lubrication. She was given some samples of the Good Clean Love with hyaluronic acid. She will follow-up in 3 months for a pessary check or sooner as needed.   She will continue the [facility] for her OAB.   All questions were answered.  Will follow up in 3 months for pessary check or sooner if needed.

## 2022-12-07 NOTE — Patient Instructions (Signed)
Use estrogen cream 3 nights a week and some other form of lubrication the other nights. You can use vitamin e cream or coconut oil. There are also other vaginal lubricants like Slippery stuff and Good Clean love that would be a good option.

## 2022-12-09 ENCOUNTER — Ambulatory Visit: Payer: Medicare HMO | Admitting: Cardiovascular Disease

## 2022-12-11 ENCOUNTER — Ambulatory Visit: Payer: Medicare HMO | Admitting: Pharmacist

## 2022-12-11 DIAGNOSIS — E119 Type 2 diabetes mellitus without complications: Secondary | ICD-10-CM

## 2022-12-11 DIAGNOSIS — I10 Essential (primary) hypertension: Secondary | ICD-10-CM

## 2022-12-11 NOTE — Patient Instructions (Addendum)
Visit Information   Goals Addressed             This Visit's Progress    Set My Target A1C-Diabetes Type 2   On track    Timeframe:  Long-Range Goal Priority:  High Start Date:   11/11/21                          Expected End Date: 05/11/22                     Follow Up Date 02/11/22    - set target A1C < 7    Why is this important?   Your target A1C is decided together by you and your doctor.  It is based on several things like your age and other health issues.    Notes:        Patient Care Plan: General Pharmacy (Adult)     Problem Identified: HTN, CAD, DM, HLD, RLS, Depression   Priority: High  Onset Date: 11/11/2021     Goal: Patient-Specific Goal   Note:   Current Barriers:  Unable to achieve control of glucose and lipids   Pharmacist Clinical Goal(s):  Patient will achieve control of glucose and lipids as evidenced by labs through collaboration with PharmD and provider.   Interventions: 1:1 collaboration with [place], Randa Evens, PA-C regarding development and update of comprehensive plan of care as evidenced by provider attestation and co-signature Inter-disciplinary care team collaboration (see longitudinal plan of care) Comprehensive medication review performed; medication list updated in electronic medical record  Hypertension (BP goal <130/80) 12/11/22 -Controlled -Current treatment: Losartan 50 mg daily Appropriate, Effective, Safe, Accessible -Medications previously tried: amlodipine  -Current home readings: controlled in office -Current dietary habits: see DM -Current exercise habits: not much lately due to sciatica pain -Denies hypotensive/hypertensive symptoms -Educated on BP goals and benefits of medications for prevention of heart attack, stroke and kidney damage; Daily salt intake goal < 2300 mg; Exercise goal of 150 minutes per week; Importance of home blood pressure monitoring; -Counseled to monitor BP at home as current, document, and  provide log at future appointments - Recently cut BP med in half due to soft BP. -Reports no concerns since this switch, she has increased her physical activity and lost about 20-25 lbs so this is also helping with BP. -No other changes, continue to monitor at home and report consistent elevations.  Hyperlipidemia: (LDL goal < 70) -Not ideally controlled, not assessed -Current treatment: Lovastatin '20mg'$  daily Appropriate, Query effective, -Medications previously tried: none noted  -Current dietary patterns: see DM -Current exercise habits: minimal -Educated on Cholesterol goals;  Benefits of statin for ASCVD risk reduction; Importance of limiting foods high in cholesterol; -LDL was elevated at last check.  Currently on low intensity statin. -Pt with DM consider at least moderate intensity. -Recommended to continue current medication Recheck lipid panel at [facility] in December, if remains elevated could consider increase to '40mg'$  or other mod intensity statin.  Update 06/08/22 Continues on same dose of Lovastatin. Lipid panel improved but LDL was slightly above goal 100. Encouraged her to continue adherence, continue to focus on healthy eating habits. No changes to medication at this time - room for dose titration to '40mg'$  of lovastatin if needed. FU at next annual labs.  Diabetes (A1c goal <7%) 12/11/22 -Controlled, most recent A1c is at goal -Current medications: [place] '5mg'$  daily - started in August  Appropriate, Effective, Safe, Accessible -  Medications previously tried: none noted  -Current home glucose readings fasting glucose: 114 most recently when she checked post prandial glucose: N/A -Denies hypoglycemic/hyperglycemic symptoms -Current meal patterns:  Same see previous - does report that she is taking care of a dog Thursday - Sunday walking it 3-4 times per day. -Current exercise: minimal due to pain -Educated on A1c and blood sugar goals; Benefits of routine self-monitoring  of blood sugar; -Counseled to check feet daily and get yearly eye exams --Due for Foot exam, diabetic kidney eval, eye exam -Counseled her to schedule a FU visit with [name] at the first of the year to recheck A1c and update labs.  Update 06/08/22 A1c improved to 7.3 after addition of [place]. No changes were made.  She continues to feel very good.  [place] time of this call. Not checking glucose very much but when she did check last fasting her readings were in the 120s. Continue as directed.  Could consider dose increase to '10mg'$  in the future but I am happy where her A1c is currently. Continue to work on diet/exercise. She is due for FOOT EXAM, EYE EXAM, and A1c recheck.  Depression/Anxiety (Goal: Minimize symptoms) -Controlled, not assessed -Current treatment: Sertraline '25mg'$  daily -Medications previously tried/failed: Lorazepam -PHQ9:  PHQ9 SCORE ONLY 07/02/2021 04/03/2021 03/10/2021  PHQ-9 Total Score 0 0 0    -Educated on Benefits of medication for symptom control -Recommended to continue current medication Reports sleep is good, no complaints of depressive symptoms.  Patient Goals/Self-Care Activities Patient will:  - take medications as prescribed as evidenced by patient report and record review check glucose a few times per week, document, and provide at future appointments target a minimum of 150 minutes of moderate intensity exercise weekly  Follow Up Plan: The care management team will reach out to the patient again over the next 120 days.              The patient verbalized understanding of instructions, educational materials, and care plan provided today and DECLINED offer to receive copy of patient instructions, educational materials, and care plan.  Telephone follow up appointment with pharmacy team member scheduled for: 4 months  Edythe Clarity, [place], PharmD Clinical Pharmacist  [facility] [phone]

## 2022-12-11 NOTE — Progress Notes (Signed)
Chronic Care Management Pharmacy Note  12/11/2022 Name:  Cheryl Wood MRN:  [id number] DOB:  12-Aug-1936  Summary: PharmD FU visit.  A1c continues to improve, she is due for FU with PCP for recheck.  BP controlled on lower dose of losartan.  She reports 20 lb weight loss which has helped glucose and BP.  Walking dog a lot which she started recently.  Recommendations/Changes made from today's visit: Recheck A1c  Due for Foot exam, UACR, eye exam Schedule visit with PCP  Plan: FU 4 months   Subjective: Cheryl Wood is an 86 y.o. year old female who is a primary patient of Allwardt, [name] M, PA-C.  The CCM team was consulted for assistance with disease management and care coordination needs.    Engaged with patient by telephone for follow up visit in response to provider referral for pharmacy case management and/or care coordination services.   Consent to Services:  The patient was given the following information about Chronic Care Management services today, agreed to services, and gave verbal consent: 1. CCM service includes personalized support from designated clinical staff supervised by the primary care provider, including individualized plan of care and coordination with other care providers 2. 24/7 contact phone numbers for assistance for urgent and routine care needs. 3. Service will only be billed when office clinical staff spend 20 minutes or more in a month to coordinate care. 4. Only one practitioner may furnish and bill the service in a calendar month. 5.The patient may stop CCM services at any time (effective at the end of the month) by phone call to the office staff. 6. The patient will be responsible for cost sharing (co-pay) of up to 20% of the service fee (after annual deductible is met). Patient agreed to services and consent obtained.  Patient Care Team: Allwardt, Randa Evens, PA-C as PCP - General (Physician Assistant) Lorretta Harp, MD as PCP - Cardiology  (Cardiology) Edythe Clarity, [facility] as Pharmacist (Pharmacist)  Recent office visits:  09/10/2021 [facility] (PCP) Allwardt, Randa Evens, PA-C; no medication changes indicated.   08/20/2021 [facility] (PCP) Allwardt, [name] M, PA-C; no medication changes indicated.   08/15/2021 [facility] (PCP) Allwardt, [name] M, PA-C;-Will start on Farxiga 5 mg at this time. SE discussed   07/02/2021 Hope Budds, FNP; Tramadol as needed for very intense pain   Recent consult visits:  10/28/2021 [facility] (ophthalmology) Dala Dock, MD;  Pred x1 OU Emycin ointment prn OD before bedtime Timolol x2 OD Brimonidine x2 OU  Recommend ATs prn Recommend Vaseline  Return in about 1 month (around 11/22/2021).   10/22/2021 [facility] (cardiology) Jeanann Lewandowsky, MD; no medication changes indicated.   10/22/2021 [facility] (ophthalmology) Bond, Tracie Harrier, MD;  Epi irreg resolved today OD.  Will decrease pred to x1 for a week then qod    10/13/2021 [facility] (pulmonology) Hunsucker, Bonna Gains, MD; no medication changes indicated.   09/23/2021 [facility] (orthopedics) Leandrew Koyanagi, MD; no medication changes indicated.   09/11/2021 [facility] (pulmonology) Hunsucker, Bonna Gains, MD; Use albuterol in the morning in the afternoons, spaced 6 hours apart, through the weekend.  Please report back on Monday if this has been helpful or not as this will be informative in our next steps.   08/20/2021 [facility] (urogyn) Jaquita Folds, MD;  - Continue #0 incontinence ring pessary. She will keep the pessary in place until next visit.  - Continue estrogen cream twice a week. Can place at introitus/ in vagina as  well as inner labia. - Continue with Gemtesa 60m daily.   08/19/2021 [facility] (ophthalmology) SDala Dock MD;  - Discussed starting MTX and discussed r/b.  - D/c 10 mg weekly MTX with 1 mg folic acid daily on 93/5/70- not tolerating MTX due to nausea and sore throat  - D/c Azathioprine 25 mg daily (due to nausea)  - Prescribed Zofran for nausea -  Tapered off 5 mg oral Prednisone daily - I recommend starting Cellcept. I have discussed side effects and patient consents to proceed on 07/30/21.  - If she fails Cellcept, I will contemplate adding Acthar.  - Continue oral prednisone pulse (started on 07/30/21) - 40 mg daily for 2 weeks, 30 mg daily for 1 week, 20 mg daily for 1 week, 10 mg daily for 1 week and then d/c - Patient self d/c Cellcept 500 mg BID (started on 07/30/21) - start 500 mg daily today 08/19/21 - Currently on Remicade infusions per EMarella ChimesPA-C - Continue Famvir 500 mg daily (decreased from TID on 09/06/18)  - Currently on PF BID OD per Dr. BEdilia Bo  08/04/2021 [facility] (ophthalmology) BEdilia Bo JTracie Harrier MD;  Restart pred x2 OD only Start emycin ointment OD before bedtime Timolol x2 OD Brimonidine x2 OU  Recommend ATs prn Recommend Vaseline  Return in 9 weeks (on 10/06/2021).   07/30/2021 [facility] (ophthalmology) SDala Dock MD;  Timolol x2 OD Pred x3 OU - per rs Brimonidine x2 OU  Recommend ATs prn Recommend Vaseline    07/24/2021 [facility] (ophthalmology) Bond, JTracie Harrier MD;  Timolol x2 OD Pred x3 OU - per rs Brimonidine x2 OU  Recommend ATs prn Recommend Vaseline    07/15/2021 [facility] (orthopedics) XLeandrew Koyanagi MD; Rx prednisone dosepak as directed   06/10/2021 [facility] (dermatology) BRosendo Gros MD; no further information available   Hospital visits:  08/28/2021 ED Visit for Shortness of Breath   Objective:  Lab Results  Component Value Date   CREATININE 1.27 (H) 06/12/2022   BUN 29 (H) 06/12/2022   GFR 50.24 (L) 12/08/2021   GFRNONAA 42 (L) 06/12/2022   GFRAA 56 (L) 10/03/2020   NA 137 06/12/2022   K 3.6 06/12/2022   CALCIUM 10.4 (H) 06/12/2022   CO2 23 06/12/2022   GLUCOSE 114 (H) 06/12/2022    Lab Results  Component Value Date/Time   HGBA1C 6.6 (A) 06/16/2022 08:33 AM   HGBA1C 7.3 (H) 12/08/2021 09:44 AM   HGBA1C 8.1 (A) 08/15/2021 10:25 AM   HGBA1C 6.9 (H) 01/03/2021 08:44 AM   HGBA1C 7.0  11/29/2019 12:00 AM   GFR 50.24 (L) 12/08/2021 09:44 AM   GFR 48.23 (L) 04/03/2021 01:37 PM   MICROALBUR <0.7 07/03/2020 09:08 AM   MICROALBUR 0.9 01/25/2019 11:49 AM    Last diabetic Eye exam: No results found for: "HMDIABEYEEXA"  Last diabetic Foot exam: No results found for: "HMDIABFOOTEX"   Lab Results  Component Value Date   CHOL 189 12/08/2021   HDL 52.50 12/08/2021   LDLCALC 102 (H) 12/08/2021   LDLDIRECT 136.0 01/25/2019   TRIG 169.0 (H) 12/08/2021   CHOLHDL 4 12/08/2021       Latest Ref Rng & Units 12/08/2021    9:44 AM 03/28/2021    9:50 AM 01/03/2021    8:44 AM  Hepatic Function  Total Protein 6.0 - 8.3 g/dL 6.9  7.1  7.3   Albumin 3.5 - 5.2 g/dL 4.4  4.3  4.6   AST 0 - 37 U/L 16  22  18   ALT 0 - 35 U/L _0 Alk Phosphatase 39 - 117 U/L 50  57  54   Total Bilirubin 0.2 - 1.2 mg/dL 0.8  1.5  1.0     Lab Results  Component Value Date/Time   TSH 0.975 06/23/2022 03:53 PM   TSH 1.82 11/29/2019 12:00 AM   TSH 1.81 10/13/2019 08:38 AM       Latest Ref Rng & Units 06/12/2022    4:08 PM 12/08/2021    9:44 AM 08/28/2021   10:11 AM  CBC  WBC 4.0 - 10.5 K/uL 6.1  5.9  8.8   Hemoglobin 12.0 - 15.0 g/dL 14.3  14.3  13.3   Hematocrit 36.0 - 46.0 % 43.8  43.2  39.7   Platelets 150 - 400 K/uL 176  172.0  124     No results found for: "VD25OH"  Clinical ASCVD: Yes  The ASCVD Risk score (Arnett DK, et al., 2019) failed to calculate for the following reasons:   The 2019 ASCVD risk score is only valid for ages 108 to 18   The patient has a prior MI or stroke diagnosis       03/16/2022    8:16 AM 07/02/2021    2:42 PM 04/03/2021   12:56 PM  Depression screen PHQ 2/9  Decreased Interest 0 0 0  Down, Depressed, Hopeless 0 0 0  PHQ - 2 Score 0 0 0  Altered sleeping  0   Tired, decreased energy  0   Change in appetite  0   Feeling bad or failure about yourself   0   Trouble concentrating  0   Moving slowly or fidgety/restless  0   Suicidal thoughts  0   PHQ-9  Score  0      Social History   Tobacco Use  Smoking Status Former   Years: 40.00   Types: Cigarettes   Quit date: 11/05/1971   Years since quitting: 51.1  Smokeless Tobacco Never   BP Readings from Last 3 Encounters:  12/07/22 107/73  09/07/22 134/78  08/07/22 (!) 98/54   Pulse Readings from Last 3 Encounters:  12/07/22 71  09/07/22 80  08/07/22 96   Wt Readings from Last 3 Encounters:  08/07/22 160 lb 9.6 oz (72.8 kg)  06/23/22 158 lb 9.6 oz (71.9 kg)  06/16/22 160 lb (72.6 kg)   BMI Readings from Last 3 Encounters:  08/07/22 29.37 kg/m  06/23/22 29.01 kg/m  06/16/22 29.26 kg/m    Assessment/Interventions: Review of patient past medical history, allergies, medications, health status, including review of consultants reports, laboratory and other test data, was performed as part of comprehensive evaluation and provision of chronic care management services.   SDOH:  (Social Determinants of Health) assessments and interventions performed: Yes  Financial Resource Strain: Low Risk  (03/16/2022)   Overall Financial Resource Strain (CARDIA)    Difficulty of Paying Living Expenses: Not hard at all    [place]: No Food Insecurity (03/16/2022)  Housing: Low Risk  (03/16/2022)  Transportation Needs: No Transportation Needs (03/16/2022)  Depression (PHQ2-9): Low Risk  (03/16/2022)  Financial Resource Strain: Low Risk  (03/16/2022)  Physical Activity: Inactive (03/16/2022)  Social Connections: Moderately Isolated (03/16/2022)  Stress: No Stress Concern Present (03/16/2022)  Tobacco Use: Medium Risk (12/07/2022)    CCM Care Plan  Allergies  Allergen Reactions   Dorzolamide Hcl-Timolol Mal Other (See Comments)    Red eyes  Tetanus Toxoids Swelling    Arm was twice the size it should be   Hctz [Hydrochlorothiazide] Rash    Medications Reviewed Today     Reviewed by Edythe Clarity, [facility] (Pharmacist) on 12/11/22 at [place] List Status: <None>    Medication Order Taking? Sig Documenting Provider Last Dose Status Informant  acetaminophen (TYLENOL) 650 MG CR tablet [id number] Yes Take 650 mg by mouth every 8 (eight) hours as needed for pain. [provider] Taking Active Multiple Informants  amoxicillin (AMOXIL) 500 MG capsule [id number] Yes Take 500 mg by mouth. Take 4 capsules before dental work [provider] Taking Active   b complex vitamins capsule [id number] Yes Take 1 capsule by mouth daily. [provider] Taking Active   brimonidine (ALPHAGAN) 0.2 % ophthalmic solution [id number] Yes Place 1 drop into both eyes 2 (two) times daily. [provider] Taking Active Multiple Informants  clopidogrel (PLAVIX) 75 MG tablet [id number] Yes Take 1 tablet (75 mg total) by mouth daily. Allwardt, Randa Evens, PA-C Taking Active   estradiol (ESTRACE) 0.1 MG/GM vaginal cream [id number] Yes Place 0.5 g vaginally 2 (two) times a week. Place 0.5g twice a week Jaquita Folds, MD Taking Active Multiple Informants  famciclovir [facility]) 500 MG tablet [id number] Yes Take 500 mg by mouth daily.  [provider] Taking Active Multiple Informants  famotidine (PEPCID) 20 MG tablet [id number] Yes TAKE ONE TABLET BY MOUTH TWICE A DAY Allwardt, [name] M, PA-C Taking Active   FARXIGA 5 MG TABS tablet [id number] Yes TAKE 1 TABLET BY MOUTH DAILY BEFORE BREAKFAST Allwardt, [name] M, PA-C Taking Active   furosemide (LASIX) 80 MG tablet [id number] Yes Take 2 tablets (160 mg total) by mouth daily. Allwardt, Randa Evens, PA-C Taking Active   GEMTESA 75 MG TABS [id number] Yes TAKE ONE TABLET BY MOUTH DAILY Jaquita Folds, MD Taking Active   glucose blood (CONTOUR NEXT TEST) test strip [id number] Yes Use as instructed Allwardt, Randa Evens, PA-C Taking Active Multiple Informants  golimumab (SIMPONI ARIA) 50 MG/4ML SOLN injection [id number] Yes Inject into the vein. Every 8 weeks [provider] Taking Active   KLOR-CON M20 20  MEQ tablet [id number] Yes TAKE ONE TABLET BY MOUTH DAILY Allwardt, Randa Evens, PA-C Taking Active   Lancets MISC [id number] Yes Check blood sugar once daily. Contour Next Device. Allwardt, Randa Evens, PA-C Taking Active Multiple Informants  loratadine (CLARITIN) 10 MG tablet [id number] Yes Take 10 mg by mouth daily. [provider] Taking Active Multiple Informants  losartan (COZAAR) 100 MG tablet [id number] Yes Take 0.5 tablets (50 mg total) by mouth daily. Almyra Deforest, [place] Taking Active   lovastatin (MEVACOR) 20 MG tablet [id number] Yes TAKE ONE TABLET BY MOUTH EVERY EVENING Allwardt, Randa Evens, PA-C Taking Active   Multiple Vitamins-Minerals (CENTRUM SILVER 50+WOMEN PO) [id number] Yes Take 1 tablet by mouth daily. [provider] Taking Active Multiple Informants  mycophenolate (CELLCEPT) 500 MG tablet [id number] Yes Take 500 mg by mouth 2 (two) times daily. [provider] Taking Active            Med Note Netta Neat, Hermina Staggers   Tue Jun 23, 2022  2:18 PM) Patient takes 500 mg daily  NON FORMULARY [id number] Yes Take 900 mg by mouth in the morning. [provider] Taking Active   pramipexole (MIRAPEX) 0.25 MG tablet [id number] Yes TAKE ONE TABLET BY MOUTH AT BEDTIME Allwardt, [name] M, PA-C Taking Active   prednisoLONE acetate (PRED FORTE) 1 % ophthalmic  suspension [id number] Yes Place 1 drop into both eyes in the morning and at bedtime. [provider] Taking Active Multiple Informants           Med Note Dorina Hoyer, [facility]   Fri Mar 28, 2021 11:22 AM)    sertraline (ZOLOFT) 25 MG tablet [id number] Yes Take 1 tablet (25 mg total) by mouth daily. Allwardt, Randa Evens, PA-C Taking Active   timolol (BETIMOL) 0.5 % ophthalmic solution [id number] Yes Place 1 drop into the right eye 2 (two) times daily. 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   Nonrheumatic aortic valve insufficiency    CAD in native artery    Dyspnea on exertion 03/30/2018   TIA (transient ischemic attack) 10/22/2013   HTN (hypertension) 10/22/2013   Hyperlipidemia 10/22/2013    Immunization History  Administered Date(s) Administered   Fluad Quad(high Dose 65+) 09/29/2019, 10/03/2020   Influenza Split 08/30/2017   Influenza-Unspecified 09/28/2018, 10/16/2021   Moderna SARS-COV2 Booster Vaccination 01/31/2021, 10/16/2021   Moderna Sars-Covid-2 Vaccination 02/09/2020, 03/08/2020, 08/27/2020   Pneumococcal Conjugate-13 03/29/2015   Pneumococcal Polysaccharide-23 11/01/2019    Conditions to be addressed/monitored:  HTN, CAD, DM, HLD, RLS, Depression  Care Plan : General Pharmacy (Adult)  Updates made by Edythe Clarity, RPH since 12/11/2022 12:00 AM     Problem: HTN, CAD, DM, HLD, RLS, Depression   Priority: High  Onset Date: 11/11/2021     Goal: Patient-Specific Goal   Note:   Current Barriers:  Unable to achieve control of glucose and lipids   Pharmacist Clinical Goal(s):  Patient will achieve control of glucose and lipids as evidenced by labs through collaboration with PharmD and provider.   Interventions: 1:1 collaboration with Allwardt, Randa Evens, PA-C regarding development and update of comprehensive plan of care as evidenced by provider attestation and co-signature Inter-disciplinary care team collaboration (see longitudinal plan of care) Comprehensive medication review performed; medication list updated in  electronic medical record  Hypertension (BP goal <130/80) 12/11/22 -Controlled -Current treatment: Losartan 50 mg daily Appropriate, Effective, Safe, Accessible -Medications previously tried: amlodipine  -Current home readings: controlled in office -Current dietary habits: see DM -Current exercise habits: not much lately due to sciatica pain -Denies hypotensive/hypertensive symptoms -Educated on BP goals and benefits of medications for prevention of heart attack, stroke and kidney damage; Daily salt intake goal < 2300 mg; Exercise goal of 150 minutes per week; Importance of home blood pressure monitoring; -Counseled to monitor BP at home as current, document, and provide log at future appointments - Recently cut BP med in half due to soft BP. -Reports no concerns since this switch, she has increased her physical activity and lost about 20-25 lbs so this is also helping with BP. -No other changes, continue to monitor at home and report consistent elevations.  Hyperlipidemia: (LDL goal < 70) -Not ideally controlled, not assessed -Current treatment: Lovastatin 68m daily Appropriate, Query effective, -Medications previously tried: none noted  -Current dietary patterns: see DM -  Current exercise habits: minimal -Educated on Cholesterol goals;  Benefits of statin for ASCVD risk reduction; Importance of limiting foods high in cholesterol; -LDL was elevated at last check.  Currently on low intensity statin. -Pt with DM consider at least moderate intensity. -Recommended to continue current medication Recheck lipid panel at [facility] in December, if remains elevated could consider increase to 23m or other mod intensity statin.  Update 06/08/22 Continues on same dose of Lovastatin. Lipid panel improved but LDL was slightly above goal 100. Encouraged her to continue adherence, continue to focus on healthy eating habits. No changes to medication at this time - room for dose titration to 456mof  lovastatin if needed. FU at next annual labs.  Diabetes (A1c goal <7%) 12/11/22 -Controlled, most recent A1c is at goal -Current medications: Farxiga 52m36maily - started in August  Appropriate, Effective, Safe, Accessible -Medications previously tried: none noted  -Current home glucose readings fasting glucose: 114 most recently when she checked post prandial glucose: N/A -Denies hypoglycemic/hyperglycemic symptoms -Current meal patterns:  Same see previous - does report that she is taking care of a dog Thursday - Sunday walking it 3-4 times per day. -Current exercise: minimal due to pain -Educated on A1c and blood sugar goals; Benefits of routine self-monitoring of blood sugar; -Counseled to check feet daily and get yearly eye exams --Due for Foot exam, diabetic kidney eval, eye exam -Counseled her to schedule a FU visit with [name] at the first of the year to recheck A1c and update labs.  Update 06/08/22 A1c improved to 7.3 after addition of Far[place]o changes were made.  She continues to feel very good.  [place] time of this call. Not checking glucose very much but when she did check last fasting her readings were in the 120s. Continue as directed.  Could consider dose increase to 9m34m the future but I am happy where her A1c is currently. Continue to work on diet/exercise. She is due for FOOT EXAM, EYE EXAM, and A1c recheck.  Depression/Anxiety (Goal: Minimize symptoms) -Controlled, not assessed -Current treatment: Sertraline 252mg752mly -Medications previously tried/failed: Lorazepam -PHQ9:  PHQ9 SCORE ONLY 07/02/2021 04/03/2021 03/10/2021  PHQ-9 Total Score 0 0 0    -Educated on Benefits of medication for symptom control -Recommended to continue current medication Reports sleep is good, no complaints of depressive symptoms.  Patient Goals/Self-Care Activities Patient will:  - take medications as prescribed as evidenced by  patient report and record review check glucose a few times per week, document, and provide at future appointments target a minimum of 150 minutes of moderate intensity exercise weekly  Follow Up Plan: The care management team will reach out to the patient again over the next 120 days.               Medication Assistance: None required.  Patient affirms current coverage meets needs.  Compliance/Adherence/Medication fill history: Care Gaps: Needs diabetic foot exam  Star-Rating Drugs: Lovastatin 20mg 69m6/23 90ds Losartan 100mg 162m/23 90ds  Patient's [facility] is:  [facility] [id number] [phone] pill box? Yes Pt endorses 100% compliance  We discussed: Benefits of medication synchronization, packaging and delivery as well as enhanced pharmacist oversight with [employer]. Patient decided to: Continue current medication management strategy  Care Plan and Follow Up Patient Decision:  Patient agrees to  Care Plan and Follow-up.  Plan: The care management team will reach out to the patient again over the next 120 days.  Beverly Milch, PharmD Clinical Pharmacist  [facility] [phone]

## 2022-12-31 ENCOUNTER — Other Ambulatory Visit: Payer: Self-pay | Admitting: Physician Assistant

## 2023-01-08 ENCOUNTER — Other Ambulatory Visit: Payer: Self-pay

## 2023-01-08 MED ORDER — LOVASTATIN 20 MG PO TABS: 20.0000 mg | ORAL_TABLET | Freq: Every evening | ORAL | 3 refills | Status: DC

## 2023-01-12 ENCOUNTER — Encounter: Payer: Self-pay | Admitting: Cardiovascular Disease

## 2023-01-12 ENCOUNTER — Ambulatory Visit: Payer: Medicare HMO | Attending: Cardiovascular Disease | Admitting: Cardiovascular Disease

## 2023-01-12 VITALS — BP 120/58 | HR 72 | Ht 62.0 in | Wt 157.0 lb

## 2023-01-12 DIAGNOSIS — Z8673 Personal history of transient ischemic attack (TIA), and cerebral infarction without residual deficits: Secondary | ICD-10-CM

## 2023-01-12 DIAGNOSIS — R002 Palpitations: Secondary | ICD-10-CM | POA: Diagnosis not present

## 2023-01-12 DIAGNOSIS — I351 Nonrheumatic aortic (valve) insufficiency: Secondary | ICD-10-CM | POA: Diagnosis not present

## 2023-01-12 DIAGNOSIS — I2510 Atherosclerotic heart disease of native coronary artery without angina pectoris: Secondary | ICD-10-CM | POA: Diagnosis not present

## 2023-01-12 DIAGNOSIS — E782 Mixed hyperlipidemia: Secondary | ICD-10-CM

## 2023-01-12 DIAGNOSIS — I10 Essential (primary) hypertension: Secondary | ICD-10-CM

## 2023-01-12 NOTE — Assessment & Plan Note (Signed)
History of occasional palpitations with event monitoring performed 06/23/2022 revealing frequent PACs, occasional PVCs and short runs of SVT.  These episodes are infrequent and are for the most part asymptomatic.

## 2023-01-12 NOTE — Assessment & Plan Note (Signed)
History of hyperlipidemia on lovastatin lipid profile performed 12/08/2021 revealing total cholesterol 189, LDL 102 and HDL 52.

## 2023-01-12 NOTE — Patient Instructions (Signed)
Medication Instructions:  The current medical regimen is effective;  continue present plan and medications.  *If you need a refill on your cardiac medications before your next appointment, please call your pharmacy*  Testing/Procedures: Echocardiogram - Your physician has requested that you have an echocardiogram. Echocardiography is a painless test that uses sound waves to create images of your heart. It provides your doctor with information about the size and shape of your heart and how well your heart's chambers and valves are working. This procedure takes approximately one hour. There are no restrictions for this procedure.    Follow-Up: At [facility], you and your health needs are our priority.  As part of our continuing mission to provide you with exceptional heart care, we have created designated Provider Care Teams.  These Care Teams include your primary Cardiologist (physician) and Advanced Practice Providers (APPs -  Physician Assistants and Nurse Practitioners) who all work together to provide you with the care you need, when you need it.  We recommend signing up for the patient portal called "MyChart".  Sign up information is provided on this After Visit Summary.  MyChart is used to connect with patients for Virtual Visits (Telemedicine).  Patients are able to view lab/test results, encounter notes, upcoming appointments, etc.  Non-urgent messages can be sent to your provider as well.   To learn more about what you can do with MyChart, go to [email or web address].    Your next appointment:   6 month(s)  Provider:   Almyra Deforest, PA-C    Then, Quay Burow, MD will plan to see you again in 12 month(s).

## 2023-01-12 NOTE — Progress Notes (Signed)
01/12/2023 Cheryl Wood   November 21, 1936  [id number]  Primary Physician Allwardt, Randa Evens, PA-C Primary Cardiologist: Lorretta Harp MD Lupe Carney, [place]  HPI:  Cheryl Wood is a 87 y.o.  moderately overweight divorced Caucasian female mother of 2 children, grandmother of 1 grandchild. 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  She was referred by her primary provider, [name] Allwardt PA-C for chronic dyspnea on exertion.  She was previously a patient of Dr. Tollie Eth .  I last saw her last saw her in the office/26/22.  She does have a history of treated hypertension, diabetes and hyperlipidemia.  She is never had a heart attack but did have a stroke back in 2007 with right-sided motor deficits that resolved with prolonged physical therapy.  She complains of dyspnea but denies chest pain.  She did have a 2D echo performed 03/28/2021 that showed normal LV systolic function, grade 1 diastolic dysfunction with mild to moderate AI.  She had a right left heart cath performed by Dr. Tamala Julian 05/12/2018 revealing minimal nonobstructive CAD with a pulmonary capital wedge pressure of 10 and an LVEDP of 23.  Since I saw her a year ago she is remained stable.  She gets occasional palpitations.  She gets rare atypical quick sharp chest pain.  She had an event monitor performed 06/23/2022 that showed frequent PACs, rare PVCs and occasional runs of SVT.   Current Meds  Medication Sig   acetaminophen (TYLENOL) 650 MG CR tablet Take 650 mg by mouth every 8 (eight) hours as needed for pain.   amoxicillin (AMOXIL) 500 MG capsule Take 500 mg by mouth. Take 4 capsules before dental work   b complex vitamins capsule Take 1 capsule by mouth daily.   brimonidine (ALPHAGAN) 0.2 % ophthalmic solution Place 1 drop into both eyes 2 (two) times daily.   clopidogrel (PLAVIX) 75 MG tablet Take 1 tablet (75 mg total) by mouth daily.   estradiol (ESTRACE) 0.1 MG/GM vaginal cream Place 0.5 g vaginally 2  (two) times a week. Place 0.5g twice a week   famciclovir (FAMVIR) 500 MG tablet Take 500 mg by mouth daily.    famotidine (PEPCID) 20 MG tablet TAKE ONE TABLET BY MOUTH TWICE A DAY   FARXIGA 5 MG TABS tablet TAKE 1 TABLET BY MOUTH DAILY BEFORE BREAKFAST   furosemide (LASIX) 80 MG tablet TAKE TWO TABLETS BY MOUTH DAILY   GEMTESA 75 MG TABS TAKE ONE TABLET BY MOUTH DAILY   glucose blood (CONTOUR NEXT TEST) test strip Use as instructed   golimumab (SIMPONI ARIA) 50 MG/4ML SOLN injection Inject into the vein. Every 8 weeks   KLOR-CON M20 20 MEQ tablet TAKE ONE TABLET BY MOUTH DAILY   Lancets MISC Check blood sugar once daily. Contour Next Device.   loratadine (CLARITIN) 10 MG tablet Take 10 mg by mouth daily.   losartan (COZAAR) 100 MG tablet Take 0.5 tablets (50 mg total) by mouth daily.   lovastatin (MEVACOR) 20 MG tablet Take 1 tablet (20 mg total) by mouth every evening.   Multiple Vitamins-Minerals (CENTRUM SILVER 50+WOMEN PO) Take 1 tablet by mouth daily.   mycophenolate (CELLCEPT) 500 MG tablet Take 500 mg by mouth 2 (two) times daily.   NON FORMULARY Take 900 mg by mouth in the morning.   pramipexole (MIRAPEX) 0.25 MG tablet TAKE ONE TABLET BY MOUTH AT BEDTIME   prednisoLONE acetate (PRED FORTE) 1 % ophthalmic suspension Place 1 drop into  both eyes in the morning and at bedtime.   sertraline (ZOLOFT) 25 MG tablet Take 1 tablet (25 mg total) by mouth daily.   timolol (BETIMOL) 0.5 % ophthalmic solution Place 1 drop into the right eye 2 (two) times daily.     Allergies  Allergen Reactions   Dorzolamide Hcl-Timolol Mal Other (See Comments)    Red eyes   Tetanus Toxoids Swelling    Arm was twice the size it should be   Hctz [Hydrochlorothiazide] Rash    Social History   Socioeconomic History   Marital status: Divorced    Spouse name: Not on file   Number of children: 2   Years of education: college   Highest education level: Not on file  Occupational History    Employer:  RETIRED  Tobacco Use   Smoking status: Former    Years: 40.00    Types: Cigarettes    Quit date: 11/05/1971    Years since quitting: 51.2   Smokeless tobacco: Never  Vaping Use   Vaping Use: Never used  Substance and Sexual Activity   Alcohol use: No    Alcohol/week: 0.0 standard drinks of alcohol   Drug use: No   Sexual activity: Not Currently  Other Topics Concern   Not on file  Social History Narrative   Patient lives at home alone   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   Worked at [employer] for 18 years   Daughters in [place] and in [place]   Caffeine Use: 2-3 cups daily   Social Determinants of Health   Financial Resource Strain: Low Risk  (03/16/2022)   Overall Financial Resource Strain (CARDIA)    Difficulty of Paying Living Expenses: Not hard at all  Food Insecurity: No Food Insecurity (03/16/2022)   Hunger Vital Sign    Worried About Running Out of Food in the Last Year: Never true    [place] in the Last Year: Never true  Transportation Needs: No Transportation Needs (03/16/2022)   PRAPARE - Hydrologist (Medical): No    Lack of Transportation (Non-Medical): No  Physical Activity: Inactive (03/16/2022)   Exercise Vital Sign    Days of Exercise per Week: 0 days    Minutes of Exercise per Session: 0 min  Stress: No Stress Concern Present (03/16/2022)   [place]    Feeling of Stress : Not at all  Social Connections: Moderately Isolated (03/16/2022)   Social Connection and Isolation Panel [NHANES]    Frequency of Communication with Friends and Family: More than three times a week    Frequency of Social Gatherings with Friends and Family: More than three times a week    Attends Religious Services: 1 to 4 times per year    Active Member of [employer] or Organizations: No    Attends Archivist Meetings: Never    Marital Status:  Divorced  Human resources officer Violence: Not At Risk (03/16/2022)   Humiliation, Afraid, Rape, and Kick questionnaire    Fear of Current or Ex-Partner: No    Emotionally Abused: No    Physically Abused: No    Sexually Abused: No     Review of Systems: General: negative for chills, fever, night sweats or weight changes.  Cardiovascular: negative for chest pain, dyspnea on exertion, edema, orthopnea, palpitations, paroxysmal nocturnal dyspnea or shortness of breath Dermatological: negative for rash Respiratory: negative  for cough or wheezing Urologic: negative for hematuria Abdominal: negative for nausea, vomiting, diarrhea, bright red blood per rectum, melena, or hematemesis Neurologic: negative for visual changes, syncope, or dizziness All other systems reviewed and are otherwise negative except as noted above.    Blood pressure (!) 120/58, pulse 72, height '5\' 2"'$  (1.575 m), weight 157 lb (71.2 kg).  General appearance: alert and no distress Neck: no adenopathy, no carotid bruit, no JVD, supple, symmetrical, trachea midline, and thyroid not enlarged, symmetric, no tenderness/mass/nodules Lungs: clear to auscultation bilaterally Heart: Soft AI murmur Extremities: extremities normal, atraumatic, no cyanosis or edema Pulses: 2+ and symmetric Skin: Skin color, texture, turgor normal. No rashes or lesions Neurologic: Grossly normal  EKG sinus rhythm at 72 with voltage criteria for LVH and septal Q waves.  Personally reviewed this EKG.  ASSESSMENT AND PLAN:   HTN (hypertension) History of essential hypertension blood pressure measured today at 120/58.  She is on losartan.  Hyperlipidemia History of hyperlipidemia on lovastatin lipid profile performed 12/08/2021 revealing total cholesterol 189, LDL 102 and HDL 52.  Nonrheumatic aortic valve insufficiency History of moderate aortic regurgitation by 2D echo performed 03/28/2021.  Will repeat a 2D echocardiogram.  CAD in native  artery History of mild nonobstructive CAD by cardiac catheterization performed by Dr. Tamala Julian 05/12/2018.  She denies chest pain or shortness of breath.  Palpitations History of occasional palpitations with event monitoring performed 06/23/2022 revealing frequent PACs, occasional PVCs and short runs of SVT.  These episodes are infrequent and are for the most part asymptomatic.     Lorretta Harp MD FACP,FACC,FAHA, [facility] 01/12/2023 9:54 AM

## 2023-01-12 NOTE — Assessment & Plan Note (Signed)
History of mild nonobstructive CAD by cardiac catheterization performed by Dr. Tamala Julian 05/12/2018.  She denies chest pain or shortness of breath.

## 2023-01-12 NOTE — Assessment & Plan Note (Signed)
History of essential hypertension blood pressure measured today at 120/58.  She is on losartan.

## 2023-01-12 NOTE — Assessment & Plan Note (Signed)
History of moderate aortic regurgitation by 2D echo performed 03/28/2021.  Will repeat a 2D echocardiogram.

## 2023-01-23 DIAGNOSIS — R052 Subacute cough: Secondary | ICD-10-CM | POA: Diagnosis not present

## 2023-01-26 ENCOUNTER — Telehealth: Payer: Self-pay | Admitting: Physician Assistant

## 2023-01-26 NOTE — Telephone Encounter (Signed)
Patient states: - Cheryl Wood to UC on 01/23/23 due to cough and congestion   - They completed an chest x ray and lungs were clear; diagnosed with a viral infection - They told her due to her age to watch if she has chills or fever  Patient would like to know what PCP recommends as a next step. Please Advise.

## 2023-01-26 NOTE — Telephone Encounter (Signed)
Please see pt call note and advise

## 2023-01-27 NOTE — Telephone Encounter (Signed)
Pt scheduled an appointment to see PCP for 02/04/23

## 2023-01-29 ENCOUNTER — Other Ambulatory Visit: Payer: Self-pay | Admitting: Physician Assistant

## 2023-02-02 ENCOUNTER — Other Ambulatory Visit: Payer: Self-pay | Admitting: Physician Assistant

## 2023-02-03 ENCOUNTER — Telehealth: Payer: Self-pay | Admitting: Pharmacist

## 2023-02-03 NOTE — Progress Notes (Signed)
Care Management & Coordination Services Pharmacy Team  Reason for Encounter: Hypertension  Contacted patient to discuss hypertension disease state. Spoke with patient on 02/03/2023     Current antihypertensive regimen:  Furosemide 80 mg daily Losartan 100 mg daily  Patient verbally confirms she is taking the above medications as directed. Yes  How often are you checking your Blood Pressure? daily   Current home BP readings: (02/03/2023) 104/72 pulse 83 - sugar 138   Wrist or arm cuff: arm Caffeine intake: drinks 1/2 cup of coffee in morning. Salt intake: does not add salt to food OTC medications including pseudoephedrine or NSAIDs? none  Any readings above 180/100? No If yes any symptoms of hypertensive emergency? patient denies any symptoms of high blood pressure  What recent interventions/DTPs have been made by any provider to improve Blood Pressure control since last CPP Visit: No recent interventions or DTPS.  Any recent hospitalizations or ED visits since last visit with CPP? No  What diet changes have been made to improve Blood Pressure Control?  Patient states since starting [place] she down to 150 lbs.  What exercise is being done to improve your Blood Pressure Control?  Patient states she walks her dog 4 times a day.  Adherence Review: Is the patient currently on ACE/ARB medication? Yes Does the patient have >5 day gap between last estimated fill dates? No  Star Rating Drugs:  Losartan 100 mg last filled 02/02/2023 90 DS Lovastatin 20 mg last filled 01/11/2023 90 DS   Chart Updates: Recent office visits:  None  Recent consult visits:  01/12/2023 [facility] (Cardiology) Lorretta Harp, MD; no medication changes indicated.  Hospital visits:  None in previous 6 months  Medications: Outpatient Encounter Medications as of 02/03/2023  Medication Sig Note   acetaminophen (TYLENOL) 650 MG CR tablet Take 650 mg by mouth every 8 (eight) hours as needed for pain.     amoxicillin (AMOXIL) 500 MG capsule Take 500 mg by mouth. Take 4 capsules before dental work    b complex vitamins capsule Take 1 capsule by mouth daily.    brimonidine (ALPHAGAN) 0.2 % ophthalmic solution Place 1 drop into both eyes 2 (two) times daily.    clopidogrel (PLAVIX) 75 MG tablet Take 1 tablet (75 mg total) by mouth daily.    estradiol (ESTRACE) 0.1 MG/GM vaginal cream Place 0.5 g vaginally 2 (two) times a week. Place 0.5g twice a week    famciclovir (FAMVIR) 500 MG tablet Take 500 mg by mouth daily.     famotidine (PEPCID) 20 MG tablet TAKE ONE TABLET BY MOUTH TWICE A DAY    FARXIGA 5 MG TABS tablet TAKE 1 TABLET BY MOUTH DAILY BEFORE BREAKFAST    furosemide (LASIX) 80 MG tablet TAKE TWO TABLETS BY MOUTH DAILY    GEMTESA 75 MG TABS TAKE ONE TABLET BY MOUTH DAILY    glucose blood (CONTOUR NEXT TEST) test strip Use as instructed    golimumab (SIMPONI ARIA) 50 MG/4ML SOLN injection Inject into the vein. Every 8 weeks    KLOR-CON M20 20 MEQ tablet TAKE ONE TABLET BY MOUTH DAILY    Lancets MISC Check blood sugar once daily. Contour Next Device.    loratadine (CLARITIN) 10 MG tablet Take 10 mg by mouth daily.    losartan (COZAAR) 100 MG tablet Take 0.5 tablets (50 mg total) by mouth daily.    lovastatin (MEVACOR) 20 MG tablet Take 1 tablet (20 mg total) by mouth every evening.    Multiple  Vitamins-Minerals (CENTRUM SILVER 50+WOMEN PO) Take 1 tablet by mouth daily.    mycophenolate (CELLCEPT) 500 MG tablet Take 500 mg by mouth 2 (two) times daily. 06/23/2022: Patient takes 500 mg daily   NON FORMULARY Take 900 mg by mouth in the morning.    pramipexole (MIRAPEX) 0.25 MG tablet TAKE 1 TABLET BY MOUTH AT BEDTIME    prednisoLONE acetate (PRED FORTE) 1 % ophthalmic suspension Place 1 drop into both eyes in the morning and at bedtime.    sertraline (ZOLOFT) 25 MG tablet Take 1 tablet (25 mg total) by mouth daily.    timolol (BETIMOL) 0.5 % ophthalmic solution Place 1 drop into the right eye 2  (two) times daily.    No facility-administered encounter medications on file as of 02/03/2023.    Recent Office Vitals: BP Readings from Last 3 Encounters:  01/12/23 (!) 120/58  12/07/22 107/73  09/07/22 134/78   Pulse Readings from Last 3 Encounters:  01/12/23 72  12/07/22 71  09/07/22 80    Wt Readings from Last 3 Encounters:  01/12/23 157 lb (71.2 kg)  08/07/22 160 lb 9.6 oz (72.8 kg)  06/23/22 158 lb 9.6 oz (71.9 kg)     Kidney Function Lab Results  Component Value Date/Time   CREATININE 1.27 (H) 06/12/2022 04:08 PM   CREATININE 1.03 12/08/2021 09:44 AM   CREATININE 1.07 (H) 10/03/2020 08:54 AM   GFR 50.24 (L) 12/08/2021 09:44 AM   GFRNONAA 42 (L) 06/12/2022 04:08 PM   GFRNONAA 48 (L) 10/03/2020 08:54 AM   GFRAA 56 (L) 10/03/2020 08:54 AM       Latest Ref Rng & Units 06/12/2022    4:08 PM 12/08/2021    9:44 AM 08/28/2021   10:11 AM  BMP  Glucose 70 - 99 mg/dL 114  121  209   BUN 8 - 23 mg/dL 29  20  24   $ Creatinine 0.44 - 1.00 mg/dL 1.27  1.03  1.06   Sodium 135 - 145 mmol/L 137  143  141   Potassium 3.5 - 5.1 mmol/L 3.6  3.8  3.6   Chloride 98 - 111 mmol/L 104  105  106   CO2 22 - 32 mmol/L 23  29  26   $ Calcium 8.9 - 10.3 mg/dL 10.4  9.9  9.1    Future Appointments  Date Time Provider [place]  02/04/2023 10:30 AM Allwardt, Randa Evens, PA-C [facility]  02/09/2023  8:35 AM [facility] E[facility] 1 [id number]  03/09/2023  9:20 AM Berton Mount, NP [facility]  03/26/2023  1:00 PM [facility] HEALTH COACH [facility]  04/13/2023  8:00 AM Edythe Clarity, [place] None     April D Calhoun, [place] Pharmacist Assistant [phone]

## 2023-02-04 ENCOUNTER — Encounter: Payer: Self-pay | Admitting: Physician Assistant

## 2023-02-04 ENCOUNTER — Ambulatory Visit (INDEPENDENT_AMBULATORY_CARE_PROVIDER_SITE_OTHER): Payer: Medicare HMO | Admitting: Physician Assistant

## 2023-02-04 VITALS — BP 126/67 | HR 74 | Resp 16 | Ht 62.0 in | Wt 160.0 lb

## 2023-02-04 DIAGNOSIS — J069 Acute upper respiratory infection, unspecified: Secondary | ICD-10-CM | POA: Diagnosis not present

## 2023-02-04 MED ORDER — AMOXICILLIN-POT CLAVULANATE 875-125 MG PO TABS: 1.0000 | ORAL_TABLET | Freq: Two times a day (BID) | ORAL | 0 refills | Status: AC

## 2023-02-04 MED ORDER — AZITHROMYCIN 250 MG PO TABS: ORAL_TABLET | ORAL | 0 refills | Status: DC

## 2023-02-04 NOTE — Progress Notes (Signed)
Subjective:    Patient ID: Cheryl Wood, female    DOB: [birth year], 87 y.o.   MRN: [id number]  Chief Complaint  Patient presents with   viral infection    - Went to UC on 01/23/23 due to cough and congestion   - They completed an chest x ray and lungs were clear; diagnosed with a viral infection - They told her due to her age to watch if she has chills or fever 104/72 yesterday g 138 162/96 this morning, did not take sugar this morning  Given tessalon pearls - not helpful No improvement since UC visit    HPI Patient is in today for cough and congestion. Symptoms started about 5 weeks ago. She was seen at urgent care - [place] - 01/23/23 - CXR normal. She was given a prescription for tessalon perles and cough drops, but not helping her.  Staying awake at night coughing, productive of mucous.  Feels tired. Not SOB. No CP. No fever or chills.   UTD with all vaccines.  No sick contacts.   Past Medical History:  Diagnosis Date   Allergy    Arthritis    Bleeding of blood vessel    ext. genitalia area   Blood in stool    Chronic kidney disease    Depression    Glaucoma    Heart murmur    History of chicken pox    History of recurrent UTIs    History of stomach ulcers    Hyperlipidemia    Hypertension    Memory loss    Rheumatic fever    Seizures ([place])    Stroke ([place])    7989; embolic   TIA (transient ischemic attack) 10/22/2013   Urine incontinence     Past Surgical History:  Procedure Laterality Date   ABDOMINAL HYSTERECTOMY     APPENDECTOMY  1983   CATARACT EXTRACTION Right May 13, 2014   EYE SURGERY  12/13/2018   stent inserted in left eye   gum transplant     RIGHT/LEFT HEART CATH AND CORONARY ANGIOGRAPHY N/A 05/12/2018   Procedure: RIGHT/LEFT HEART CATH AND CORONARY ANGIOGRAPHY;  Surgeon: Belva Crome, MD;  Location: [place] [facility];  Service: Cardiovascular;  Laterality: N/A;   THORACIC AORTOGRAM N/A 05/12/2018   Procedure: THORACIC  AORTOGRAM;  Surgeon: Belva Crome, MD;  Location: [place] [facility];  Service: Cardiovascular;  Laterality: N/A;   TONSILLECTOMY      Family History  Problem Relation Age of Onset   Cancer Mother    Early death Mother    Heart disease Father    Arthritis Father    Early death Father    Hearing loss Father    Hypertension Father    Hyperlipidemia Father    Cancer Sister    Arthritis Sister    Arthritis Brother    Diabetes Brother    Heart attack Brother    Heart disease Brother    Hyperlipidemia Brother    Hypertension Brother    Diabetes Daughter    Heart disease Daughter    Hyperlipidemia Daughter    Hyperlipidemia Maternal Grandmother    Hypertension Maternal Grandmother    Stroke Maternal Grandmother    Alcohol abuse Maternal Grandfather    Early death Maternal Grandfather        tuberculosis   Arthritis Paternal Grandmother    Alcohol abuse Paternal Grandfather    Heart disease Paternal Grandfather    Hyperlipidemia Paternal Merchant navy officer  Hypertension Paternal Grandfather    Stroke Paternal Grandfather    Alcohol abuse Sister    COPD Sister    Drug abuse Sister    Early death Sister    Heart disease Sister    Hypertension Sister    Hyperlipidemia Sister    Early death Brother    Early death Brother     Social History   Tobacco Use   Smoking status: Former    Years: 40.00    Types: Cigarettes    Quit date: 11/05/1971    Years since quitting: 51.2   Smokeless tobacco: Never  Vaping Use   Vaping Use: Never used  Substance Use Topics   Alcohol use: No    Alcohol/week: 0.0 standard drinks of alcohol   Drug use: No     Allergies  Allergen Reactions   Dorzolamide Hcl-Timolol Mal Other (See Comments)    Red eyes   Tetanus Toxoids Swelling    Arm was twice the size it should be   Hctz [Hydrochlorothiazide] Rash    Review of Systems NEGATIVE UNLESS OTHERWISE INDICATED IN HPI      Objective:     BP 126/67   Pulse 74   Resp 16   Ht '5\' 2"'$   (1.575 m)   Wt 160 lb (72.6 kg)   LMP  (LMP Unknown)   SpO2 99%   BMI 29.26 kg/m   Wt Readings from Last 3 Encounters:  02/04/23 160 lb (72.6 kg)  01/12/23 157 lb (71.2 kg)  08/07/22 160 lb 9.6 oz (72.8 kg)    BP Readings from Last 3 Encounters:  02/04/23 126/67  01/12/23 (!) 120/58  12/07/22 107/73     Physical Exam Vitals and nursing note reviewed.  Constitutional:      Appearance: Normal appearance.  HENT:     Mouth/Throat:     Mouth: Mucous membranes are moist.  Eyes:     Extraocular Movements: Extraocular movements intact.     Conjunctiva/sclera: Conjunctivae normal.     Pupils: Pupils are equal, round, and reactive to light.  Cardiovascular:     Rate and Rhythm: Normal rate and regular rhythm.     Pulses: Normal pulses.  Pulmonary:     Effort: Pulmonary effort is normal. No respiratory distress.     Breath sounds: No stridor. Rhonchi (Right upper and lower lobe) present. No wheezing or rales.  Chest:     Chest wall: No tenderness.  Skin:    General: Skin is warm.  Neurological:     General: No focal deficit present.     Mental Status: She is alert and oriented to person, place, and time.  Psychiatric:        Mood and Affect: Mood normal.        Behavior: Behavior normal.        Assessment & Plan:  Upper respiratory infection with cough and congestion  Other orders -     Amoxicillin-Pot Clavulanate; Take 1 tablet by mouth 2 (two) times daily for 7 days.  Dispense: 14 tablet; Refill: 0 -     Azithromycin; Take two tablets on day one, followed by one tablet daily for the next four days.  Dispense: 6 tablet; Refill: 0   Persistent symptoms >10 days despite conservative efforts at home. No red flags on exam. Will Rx Augmentin & Z-pak at this time, take with food. Cautioned on antibiotic use and possible side effects. Advised nasal saline, humidifier, and pushing fluids. Coricidin HBP for cough. Call if  worse or no improvement.      Return in about 2  weeks (around 02/18/2023) for fasting labs, med check .    Dondrell Loudermilk M Caralina Nop, PA-C

## 2023-02-09 ENCOUNTER — Ambulatory Visit (HOSPITAL_COMMUNITY): Payer: Medicare HMO | Attending: Cardiovascular Disease

## 2023-02-09 DIAGNOSIS — I10 Essential (primary) hypertension: Secondary | ICD-10-CM | POA: Diagnosis not present

## 2023-02-09 DIAGNOSIS — E782 Mixed hyperlipidemia: Secondary | ICD-10-CM | POA: Diagnosis not present

## 2023-02-09 DIAGNOSIS — Z8673 Personal history of transient ischemic attack (TIA), and cerebral infarction without residual deficits: Secondary | ICD-10-CM | POA: Insufficient documentation

## 2023-02-09 LAB — ECHOCARDIOGRAM COMPLETE
AR max vel: 2.68 cm2
AV Area VTI: 2.81 cm2
AV Area mean vel: 2.58 cm2
AV Mean grad: 6.5 mmHg
AV Peak grad: 12.2 mmHg
Ao pk vel: 1.75 m/s
Area-P 1/2: 2.95 cm2
MV VTI: 3.45 cm2
P 1/2 time: 503 msec
S' Lateral: 2.7 cm

## 2023-02-18 DIAGNOSIS — Z79899 Other long term (current) drug therapy: Secondary | ICD-10-CM | POA: Diagnosis not present

## 2023-02-18 DIAGNOSIS — M0600 Rheumatoid arthritis without rheumatoid factor, unspecified site: Secondary | ICD-10-CM | POA: Diagnosis not present

## 2023-02-22 ENCOUNTER — Ambulatory Visit (INDEPENDENT_AMBULATORY_CARE_PROVIDER_SITE_OTHER): Payer: Medicare HMO | Admitting: Physician Assistant

## 2023-02-22 ENCOUNTER — Encounter: Payer: Self-pay | Admitting: Physician Assistant

## 2023-02-22 VITALS — BP 120/74 | HR 59 | Temp 97.5°F | Ht 62.0 in | Wt 152.4 lb

## 2023-02-22 DIAGNOSIS — N1831 Chronic kidney disease, stage 3a: Secondary | ICD-10-CM | POA: Diagnosis not present

## 2023-02-22 DIAGNOSIS — E785 Hyperlipidemia, unspecified: Secondary | ICD-10-CM

## 2023-02-22 DIAGNOSIS — E1169 Type 2 diabetes mellitus with other specified complication: Secondary | ICD-10-CM

## 2023-02-22 DIAGNOSIS — E1165 Type 2 diabetes mellitus with hyperglycemia: Secondary | ICD-10-CM | POA: Insufficient documentation

## 2023-02-22 DIAGNOSIS — I10 Essential (primary) hypertension: Secondary | ICD-10-CM | POA: Diagnosis not present

## 2023-02-22 LAB — COMPREHENSIVE METABOLIC PANEL
ALT: 12 U/L (ref 0–35)
AST: 17 U/L (ref 0–37)
Albumin: 4.2 g/dL (ref 3.5–5.2)
Alkaline Phosphatase: 73 U/L (ref 39–117)
BUN: 26 mg/dL — ABNORMAL HIGH (ref 6–23)
CO2: 32 mEq/L (ref 19–32)
Calcium: 10 mg/dL (ref 8.4–10.5)
Chloride: 105 mEq/L (ref 96–112)
Creatinine, Ser: 1.07 mg/dL (ref 0.40–1.20)
GFR: 47.59 mL/min — ABNORMAL LOW (ref >60.00)
Glucose, Bld: 119 mg/dL — ABNORMAL HIGH (ref 70–99)
Potassium: 4.1 mEq/L (ref 3.5–5.1)
Sodium: 145 mEq/L (ref 135–145)
Total Bilirubin: 1 mg/dL (ref 0.2–1.2)
Total Protein: 6.8 g/dL (ref 6.0–8.3)

## 2023-02-22 LAB — MICROALBUMIN / CREATININE URINE RATIO
Creatinine,U: 94 mg/dL
Microalb Creat Ratio: 1.2 mg/g (ref 0.0–30.0)
Microalb, Ur: 1.2 mg/dL (ref 0.0–1.9)

## 2023-02-22 LAB — LIPID PANEL
Cholesterol: 215 mg/dL — ABNORMAL HIGH (ref 0–200)
HDL: 45.5 mg/dL (ref >39.00)
LDL Cholesterol: 131 mg/dL — ABNORMAL HIGH (ref 0–99)
NonHDL: 169.34
Total CHOL/HDL Ratio: 5
Triglycerides: 192 mg/dL — ABNORMAL HIGH (ref 0.0–149.0)
VLDL: 38.4 mg/dL (ref 0.0–40.0)

## 2023-02-22 LAB — HEMOGLOBIN A1C: Hgb A1c MFr Bld: 6.8 % — ABNORMAL HIGH (ref 4.6–6.5)

## 2023-02-22 NOTE — Patient Instructions (Addendum)
Let's schedule for a head-to-head blood pressure cuff comparison - you can do a nurse visit with Rosine Door and bring your machine in.   See you back in 3-4 months for recheck.  Let me know how your eye appointment goes. I'm praying!   Labs today.

## 2023-02-22 NOTE — Assessment & Plan Note (Signed)
Blood pressure is normal per our reading today.  She is on losartan 50 mg daily.  She is going to schedule a nurse visit to check the validity of her machine.

## 2023-02-22 NOTE — Assessment & Plan Note (Signed)
BP is well controlled. Check labs today.

## 2023-02-22 NOTE — Assessment & Plan Note (Signed)
Recheck labs today Farxiga 5 mg daily  Doing well with lifestyle

## 2023-02-22 NOTE — Assessment & Plan Note (Signed)
Labs today. Takes lovastatin 20 mg daily.

## 2023-02-22 NOTE — Progress Notes (Signed)
Subjective:    Patient ID: Cheryl Wood, female    DOB: 03/14/1936, 87 y.o.   MRN: [id number]  Chief Complaint  Patient presents with   Follow-up    Pt in office for 2 wk follow up and fasting labs; pt is c/o blood pressure being to low and stating meds have been cut in half by Cardiologist and since then it is running high again; pt wants to discuss adjust medication for hypertension; still has cough and congestion, requested results for diabetic eye exam    HPI Patient is in today for follow-up. Needs fasting labs checked. Concerns about her blood pressure.   She has appt with Dr. Edilia Bo coming up. She's concerned about potential of going blind and will talk with them about this. Her license expires on her birthday and DMV will not renew it due to her vision restrictions. Daughters, church members, co-workers  Past Medical History:  Diagnosis Date   Allergy    Arthritis    Bleeding of blood vessel    ext. 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; embolic   TIA (transient ischemic attack) 10/22/2013   Urine incontinence     Past Surgical History:  Procedure Laterality Date   ABDOMINAL HYSTERECTOMY     APPENDECTOMY  1983   CATARACT EXTRACTION Right May 13, 2014   EYE SURGERY  12/13/2018   stent inserted in left eye   gum transplant     RIGHT/LEFT HEART CATH AND CORONARY ANGIOGRAPHY N/A 05/12/2018   Procedure: RIGHT/LEFT HEART CATH AND CORONARY ANGIOGRAPHY;  Surgeon: Belva Crome, MD;  Location: [facility];  Service: Cardiovascular;  Laterality: N/A;   THORACIC AORTOGRAM N/A 05/12/2018   Procedure: THORACIC AORTOGRAM;  Surgeon: Belva Crome, MD;  Location: [facility];  Service: Cardiovascular;   Laterality: N/A;   TONSILLECTOMY      Family History  Problem Relation Age of Onset   Cancer Mother    Early death Mother    Heart disease Father    Arthritis Father    Early death Father    Hearing loss Father    Hypertension Father    Hyperlipidemia Father    Cancer Sister    Arthritis Sister    Arthritis Brother    Diabetes Brother    Heart attack Brother    Heart disease Brother    Hyperlipidemia Brother    Hypertension Brother    Diabetes Daughter    Heart disease Daughter    Hyperlipidemia Daughter    Hyperlipidemia Maternal Grandmother    Hypertension Maternal Grandmother    Stroke Maternal Grandmother    Alcohol abuse Maternal Grandfather    Early death Maternal Grandfather        tuberculosis   Arthritis Paternal Grandmother    Alcohol abuse Paternal Grandfather    Heart disease Paternal Grandfather    Hyperlipidemia Paternal Grandfather    Hypertension Paternal Grandfather    Stroke Paternal Grandfather    Alcohol abuse Sister    COPD Sister    Drug abuse Sister  Early death Sister    Heart disease Sister    Hypertension Sister    Hyperlipidemia Sister    Early death Brother    Early death Brother     Social History   Tobacco Use   Smoking status: Former    Years: 40.00    Types: Cigarettes    Quit date: 11/05/1971    Years since quitting: 51.3   Smokeless tobacco: Never  Vaping Use   Vaping Use: Never used  Substance Use Topics   Alcohol use: No    Alcohol/week: 0.0 standard drinks of alcohol   Drug use: No     Allergies  Allergen Reactions   Dorzolamide Hcl-Timolol Mal Other (See Comments)    Red eyes   Tetanus Toxoids Swelling    Arm was twice the size it should be   Hctz [Hydrochlorothiazide] Rash    Review of Systems NEGATIVE UNLESS OTHERWISE INDICATED IN HPI      Objective:     BP 120/74 (BP Location: Left Arm)   Pulse (!) 59   Temp (!) 97.5 F (36.4 C) (Temporal)   Ht '5\' 2"'$  (1.575 m)   Wt 152 lb 6.4 oz (69.1 kg)    LMP  (LMP Unknown)   SpO2 98%   BMI 27.87 kg/m   Wt Readings from Last 3 Encounters:  02/22/23 152 lb 6.4 oz (69.1 kg)  02/04/23 160 lb (72.6 kg)  01/12/23 157 lb (71.2 kg)    BP Readings from Last 3 Encounters:  02/22/23 120/74  02/04/23 126/67  01/12/23 (!) 120/58     Physical Exam Vitals and nursing note reviewed.  Constitutional:      Appearance: Normal appearance.  HENT:     Head: Normocephalic.  Cardiovascular:     Rate and Rhythm: Normal rate and regular rhythm.     Pulses: Normal pulses.     Heart sounds: No murmur heard. Pulmonary:     Effort: Pulmonary effort is normal.     Breath sounds: Normal breath sounds. No wheezing or rales.  Musculoskeletal:     Right lower leg: No edema.     Left lower leg: No edema.  Neurological:     Mental Status: She is alert.  Psychiatric:        Mood and Affect: Mood normal.        Behavior: Behavior normal.        Assessment & Plan:  Essential hypertension Assessment & Plan: Blood pressure is normal per our reading today.  She is on losartan 50 mg daily.  She is going to schedule a nurse visit to check the validity of her machine.  Orders: -     Comprehensive metabolic panel  Type 2 diabetes mellitus with hyperglycemia, without long-term current use of insulin (HCC) Assessment & Plan: Recheck labs today Farxiga 5 mg daily  Doing well with lifestyle   Orders: -     Comprehensive metabolic panel -     Hemoglobin A1c -     Microalbumin / creatinine urine ratio  Chronic kidney disease, stage 3a (HCC) Assessment & Plan: BP is well controlled. Check labs today.   Orders: -     Comprehensive metabolic panel -     Microalbumin / creatinine urine ratio  Hyperlipidemia associated with type 2 diabetes mellitus ([place]) Assessment & Plan: Labs today. Takes lovastatin 20 mg daily.  Orders: -     Lipid panel        Return in about 4 months (around  06/23/2023) for recheck .     Beulah Capobianco M Darran Gabay, PA-C

## 2023-02-23 ENCOUNTER — Other Ambulatory Visit: Payer: Self-pay

## 2023-02-23 MED ORDER — LOVASTATIN 40 MG PO TABS
40.0000 mg | ORAL_TABLET | Freq: Every day | ORAL | 3 refills | Status: DC
Start: 2023-02-23 — End: 2024-02-15

## 2023-02-24 ENCOUNTER — Ambulatory Visit: Payer: Medicare HMO

## 2023-02-24 MED ORDER — BLOOD PRESSURE MONITOR AUTOMAT DEVI: 1.0000 | Freq: Once | 0 refills | Status: AC

## 2023-02-24 NOTE — Progress Notes (Unsigned)
Pt was seen today for culf calibration. Bp readings: Hers Left : 173/98 Hers Right: 138/86 Ours Left: 131/71 Ours Right: 158/74  Pt was advised by Inda Coke to get new culf.

## 2023-02-24 NOTE — Patient Instructions (Addendum)
It was great to see you!  Your blood pressure cuff is inaccurate Please purchase a new cuff and monitor  I like [employer] brand   I have sent in a prescription to your pharmacy to see if they can help run this through your insurance   Please bring NEW blood pressure cuff and readings to your next appointment so we can compare

## 2023-03-02 DIAGNOSIS — H903 Sensorineural hearing loss, bilateral: Secondary | ICD-10-CM | POA: Diagnosis not present

## 2023-03-04 ENCOUNTER — Encounter: Payer: Self-pay | Admitting: Physician Assistant

## 2023-03-04 DIAGNOSIS — H903 Sensorineural hearing loss, bilateral: Secondary | ICD-10-CM | POA: Diagnosis not present

## 2023-03-08 ENCOUNTER — Ambulatory Visit: Payer: Medicare HMO | Admitting: Obstetrics and Gynecology

## 2023-03-08 DIAGNOSIS — M272 Inflammatory conditions of jaws: Secondary | ICD-10-CM | POA: Diagnosis not present

## 2023-03-08 NOTE — Progress Notes (Unsigned)
[facility]   Subjective:     Chief Complaint: No chief complaint on file.  History of Present Illness: Cheryl Wood is a 87 y.o. female with stress incontinence who presents for a pessary check. She is using a size #0 incontinence ring pessary. The pessary has been working well and she has no complaints. She {ACTION; IS/IS [id number] using vaginal estrogen. She denies vaginal bleeding.  Past Medical History: Patient  has a past medical history of Allergy, Arthritis, Bleeding of blood vessel, Blood in stool, Chronic kidney disease, Depression, Glaucoma, Heart murmur, History of chicken pox, History of recurrent UTIs, History of stomach ulcers, Hyperlipidemia, Hypertension, Memory loss, Rheumatic fever, Seizures ([place]), Stroke ([place]), TIA (transient ischemic attack) (10/22/2013), and Urine incontinence.   Past Surgical History: She  has a past surgical history that includes Abdominal hysterectomy; gum transplant; Cataract extraction (Right, May 13, 2014); RIGHT/LEFT HEART CATH AND CORONARY ANGIOGRAPHY (N/A, 05/12/2018); THORACIC AORTOGRAM (N/A, 05/12/2018); Eye surgery (12/13/2018); Appendectomy (1983); and Tonsillectomy.   Medications: She has a current medication list which includes the following prescription(s): acetaminophen, b complex vitamins, benzonatate, brimonidine, clopidogrel, estradiol, famciclovir, famotidine, farxiga, furosemide, gemtesa, contour next test, simponi aria, klor-con m20, lancets, loratadine, losartan, lovastatin, multiple vitamins-minerals, mycophenolate, NON FORMULARY, pramipexole, prednisolone acetate, sertraline, and timolol.   Allergies: Patient is allergic to dorzolamide hcl-timolol mal, tetanus toxoids, and hctz [hydrochlorothiazide].   Social History: Patient  reports that she quit smoking about 51 years ago. Her smoking use included cigarettes. She has never used smokeless tobacco. She reports that she does not drink alcohol and does not use  drugs.      Objective:    Physical Exam: LMP  (LMP Unknown)  Gen: No apparent distress, A&O x 3. Detailed Urogynecologic Evaluation:  Pelvic Exam: Normal external female genitalia; Bartholin's and Skene's glands normal in appearance; urethral meatus {urethra:24773}, no urethral masses or discharge. The pessary was noted to be {in place:24774}. It was removed and cleaned. Speculum exam revealed {vaginal lesions:24775} in the vagina. The pessary was replaced. It was comfortable to the patient and fit well.       No data to display          Laboratory Results: Urine dipstick shows: {ua dip:315374::"negative for all components"}.    Assessment/Plan:    Assessment: Cheryl Wood is a 87 y.o. with stress incontinence here for a pessary check. She is doing well.  Plan: She will {pessary plan:24776}. She will continue to use {lubricant:24777}. She will follow-up in *** {days/wks/mos/yrs:310907} for a pessary check or sooner as needed.  All questions were answered.   Time Spent:

## 2023-03-09 ENCOUNTER — Ambulatory Visit (INDEPENDENT_AMBULATORY_CARE_PROVIDER_SITE_OTHER): Payer: Medicare HMO | Admitting: Obstetrics and Gynecology

## 2023-03-09 ENCOUNTER — Encounter: Payer: Self-pay | Admitting: Obstetrics and Gynecology

## 2023-03-09 VITALS — BP 127/77 | HR 79

## 2023-03-09 DIAGNOSIS — N952 Postmenopausal atrophic vaginitis: Secondary | ICD-10-CM

## 2023-03-09 DIAGNOSIS — N393 Stress incontinence (female) (male): Secondary | ICD-10-CM

## 2023-03-09 DIAGNOSIS — N939 Abnormal uterine and vaginal bleeding, unspecified: Secondary | ICD-10-CM | POA: Diagnosis not present

## 2023-03-09 MED ORDER — ESTRADIOL 7.5 MCG/24HR VA RING: 1.0000 | VAGINAL_RING | VAGINAL | 3 refills | Status: DC

## 2023-03-09 NOTE — Patient Instructions (Signed)
We will plan to leave the pessary out for 2 weeks. Use the vaginal estrogen cream every night for those two weeks. Please call the office if you need a sooner appointment.   I have sent in the e-string, you will bring this to your next appointment and I will insert it before the pessary is re-inserted.

## 2023-03-11 ENCOUNTER — Telehealth: Payer: Self-pay | Admitting: Physician Assistant

## 2023-03-11 NOTE — Telephone Encounter (Signed)
[place] to schedule their annual wellness visit. Appointment made for 03/18/2023.  [place] Direct Dial [phone]

## 2023-03-15 ENCOUNTER — Telehealth: Payer: Self-pay

## 2023-03-15 NOTE — Progress Notes (Signed)
Patient [name] PA was denied due to patient not trying preferred formulary drug. [name] will attempt a peer to peer to get E-String approved.

## 2023-03-15 NOTE — Telephone Encounter (Signed)
Cheryl Wood is a 87 y.o. female called in [place] she said she got a message from the pharmacy saying her estring will cost her $300. I explained to the patient Cheryl Wood is working on it and she will call her when she gets a final answer.

## 2023-03-16 ENCOUNTER — Telehealth: Payer: Self-pay | Admitting: Obstetrics and Gynecology

## 2023-03-16 NOTE — Telephone Encounter (Signed)
Called and Spoke to [employer] regarding her medications. The pharmacy said that the Tier reduction has been accepted for her Estring. Will plan for patient to pick up Estring as long as it is affordable to her. She reports she has stopped bleeding at this time.  If patient unable to afford Estring, will have her bring in her estrogen cream and place it behind the pessary so she can have some support for the back vaginal walls. She is aware and agrees to this plan of care.

## 2023-03-18 ENCOUNTER — Ambulatory Visit (INDEPENDENT_AMBULATORY_CARE_PROVIDER_SITE_OTHER): Payer: Medicare HMO

## 2023-03-18 VITALS — Wt 152.0 lb

## 2023-03-18 DIAGNOSIS — Z0000 Encounter for general adult medical examination without abnormal findings: Secondary | ICD-10-CM | POA: Diagnosis not present

## 2023-03-18 NOTE — Patient Instructions (Signed)
[name] , Thank you for taking time to come for your Medicare Wellness Visit. I appreciate your ongoing commitment to your health goals. Please review the following plan we discussed and let me know if I can assist you in the future.   These are the goals we discussed:  Goals      Patient Stated     Being able to get up is a goal      Patient Stated     Get rid of this left hip pain     Set My Target A1C-Diabetes Type 2     Timeframe:  Long-Range Goal Priority:  High Start Date:   11/11/21                          Expected End Date: 05/11/22                     Follow Up Date 02/11/22    - set target A1C < 7    Why is this important?   Your target A1C is decided together by you and your doctor.  It is based on several things like your age and other health issues.    Notes:         This is a list of the screening recommended for you and due dates:  Health Maintenance  Topic Date Due   DTaP/Tdap/Td vaccine (1 - Tdap) Never done   Zoster (Shingles) Vaccine (1 of 2) Never done   COVID-19 Vaccine (5 - 2023-24 season) 09/28/2022   Hemoglobin A1C  08/23/2023   Mammogram  10/22/2023   Eye exam for diabetics  11/25/2023   Yearly kidney function blood test for diabetes  02/23/2024   Yearly kidney health urinalysis for diabetes  02/23/2024   Complete foot exam   02/23/2024   Medicare Annual Wellness Visit  03/17/2024   Pneumonia Vaccine  Completed   Flu Shot  Completed   DEXA scan (bone density measurement)  Completed   HPV Vaccine  Aged Out    Advanced directives: Please bring a copy of your health care power of attorney and living will to the office at your convenience.  Conditions/risks identified: stay healthy and active   Next appointment: Follow up in one year for your annual wellness visit    Preventive Care 65 Years and Older, Female Preventive care refers to lifestyle choices and visits with your health care provider that can promote health and  wellness. What does preventive care include? A yearly physical exam. This is also called an annual well check. Dental exams once or twice a year. Routine eye exams. Ask your health care provider how often you should have your eyes checked. Personal lifestyle choices, including: Daily care of your teeth and gums. Regular physical activity. Eating a healthy diet. Avoiding tobacco and drug use. Limiting alcohol use. Practicing safe sex. Taking low-dose aspirin every day. Taking vitamin and mineral supplements as recommended by your health care provider. What happens during an annual well check? The services and screenings done by your health care provider during your annual well check will depend on your age, overall health, lifestyle risk factors, and family history of disease. Counseling  Your health care provider may ask you questions about your: Alcohol use. Tobacco use. Drug use. Emotional well-being. Home and relationship well-being. Sexual activity. Eating habits. History of falls. Memory and ability to understand (cognition). Work and work Statistician. Reproductive health. Screening  You may have the following tests or measurements: Height, weight, and BMI. Blood pressure. Lipid and cholesterol levels. These may be checked every 5 years, or more frequently if you are over 11 years old. Skin check. Lung cancer screening. You may have this screening every year starting at age 50 if you have a 30-pack-year history of smoking and currently smoke or have quit within the past 15 years. Fecal occult blood test (FOBT) of the stool. You may have this test every year starting at age 40. Flexible sigmoidoscopy or colonoscopy. You may have a sigmoidoscopy every 5 years or a colonoscopy every 10 years starting at age 10. Hepatitis C blood test. Hepatitis B blood test. Sexually transmitted disease (STD) testing. Diabetes screening. This is done by checking your blood sugar (glucose)  after you have not eaten for a while (fasting). You may have this done every 1-3 years. Bone density scan. This is done to screen for osteoporosis. You may have this done starting at age 51. Mammogram. This may be done every 1-2 years. Talk to your health care provider about how often you should have regular mammograms. Talk with your health care provider about your test results, treatment options, and if necessary, the need for more tests. Vaccines  Your health care provider may recommend certain vaccines, such as: Influenza vaccine. This is recommended every year. Tetanus, diphtheria, and acellular pertussis (Tdap, Td) vaccine. You may need a Td booster every 10 years. Zoster vaccine. You may need this after age 65. Pneumococcal 13-valent conjugate (PCV13) vaccine. One dose is recommended after age 30. Pneumococcal polysaccharide (PPSV23) vaccine. One dose is recommended after age 44. Talk to your health care provider about which screenings and vaccines you need and how often you need them. This information is not intended to replace advice given to you by your health care provider. Make sure you discuss any questions you have with your health care provider. Document Released: 01/10/2016 Document Revised: 09/02/2016 Document Reviewed: 10/15/2015 Elsevier Interactive Patient Education  2017 [employer] Prevention in the Home Falls can cause injuries. They can happen to people of all ages. There are many things you can do to make your home safe and to help prevent falls. What can I do on the outside of my home? Regularly fix the edges of walkways and driveways and fix any cracks. Remove anything that might make you trip as you walk through a door, such as a raised step or threshold. Trim any bushes or trees on the path to your home. Use bright outdoor lighting. Clear any walking paths of anything that might make someone trip, such as rocks or tools. Regularly check to see if  handrails are loose or broken. Make sure that both sides of any steps have handrails. Any raised decks and porches should have guardrails on the edges. Have any leaves, snow, or ice cleared regularly. Use sand or salt on walking paths during winter. Clean up any spills in your garage right away. This includes oil or grease spills. What can I do in the bathroom? Use night lights. Install grab bars by the toilet and in the tub and shower. Do not use towel bars as grab bars. Use non-skid mats or decals in the tub or shower. If you need to sit down in the shower, use a plastic, non-slip stool. Keep the floor dry. Clean up any water that spills on the floor as soon as it happens. Remove soap buildup in the tub or shower regularly.  Attach bath mats securely with double-sided non-slip rug tape. Do not have throw rugs and other things on the floor that can make you trip. What can I do in the bedroom? Use night lights. Make sure that you have a light by your bed that is easy to reach. Do not use any sheets or blankets that are too big for your bed. They should not hang down onto the floor. Have a firm chair that has side arms. You can use this for support while you get dressed. Do not have throw rugs and other things on the floor that can make you trip. What can I do in the kitchen? Clean up any spills right away. Avoid walking on wet floors. Keep items that you use a lot in easy-to-reach places. If you need to reach something above you, use a strong step stool that has a grab bar. Keep electrical cords out of the way. Do not use floor polish or wax that makes floors slippery. If you must use wax, use non-skid floor wax. Do not have throw rugs and other things on the floor that can make you trip. What can I do with my stairs? Do not leave any items on the stairs. Make sure that there are handrails on both sides of the stairs and use them. Fix handrails that are broken or loose. Make sure that  handrails are as long as the stairways. Check any carpeting to make sure that it is firmly attached to the stairs. Fix any carpet that is loose or worn. Avoid having throw rugs at the top or bottom of the stairs. If you do have throw rugs, attach them to the floor with carpet tape. Make sure that you have a light switch at the top of the stairs and the bottom of the stairs. If you do not have them, ask someone to add them for you. What else can I do to help prevent falls? Wear shoes that: Do not have high heels. Have rubber bottoms. Are comfortable and fit you well. Are closed at the toe. Do not wear sandals. If you use a stepladder: Make sure that it is fully opened. Do not climb a closed stepladder. Make sure that both sides of the stepladder are locked into place. Ask someone to hold it for you, if possible. Clearly mark and make sure that you can see: Any grab bars or handrails. First and last steps. Where the edge of each step is. Use tools that help you move around (mobility aids) if they are needed. These include: Canes. Walkers. Scooters. Crutches. Turn on the lights when you go into a dark area. Replace any light bulbs as soon as they burn out. Set up your furniture so you have a clear path. Avoid moving your furniture around. If any of your floors are uneven, fix them. If there are any pets around you, be aware of where they are. Review your medicines with your doctor. Some medicines can make you feel dizzy. This can increase your chance of falling. Ask your doctor what other things that you can do to help prevent falls. This information is not intended to replace advice given to you by your health care provider. Make sure you discuss any questions you have with your health care provider. Document Released: 10/10/2009 Document Revised: 05/21/2016 Document Reviewed: 01/18/2015 Elsevier Interactive Patient Education  2017 [employer].

## 2023-03-18 NOTE — Progress Notes (Signed)
I connected with  [name] on 03/18/23 by a audio enabled telemedicine application and verified that I am speaking with the correct person using two identifiers.  Patient Location: Home  Provider Location: Office/Clinic  I discussed the limitations of evaluation and management by telemedicine. The patient expressed understanding and agreed to proceed.   Subjective:   MAECYN WENDLAND is a 87 y.o. female who presents for Medicare Annual (Subsequent) preventive examination.  Review of Systems           Objective:    Today's Vitals   03/18/23 1353  Weight: 152 lb (68.9 kg)   Body mass index is 27.8 kg/m.     03/18/2023    1:58 PM 06/12/2022    4:00 PM 03/16/2022    8:18 AM 08/28/2021    9:59 AM 07/23/2021   10:27 AM 03/28/2021    9:47 AM 03/10/2021    8:15 AM  Advanced Directives  Does Patient Have a Medical Advance Directive? Yes No Yes No No No Yes  Type of Paramedic of [place];Living will  Healthcare Power of [place] in Chart? No - copy requested  No - copy requested    No - copy requested  Would patient like information on creating a medical advance directive?  No - Patient declined   No - Patient declined      Current Medications (verified) Outpatient Encounter Medications as of 03/18/2023  Medication Sig   acetaminophen (TYLENOL) 650 MG CR tablet Take 650 mg by mouth every 8 (eight) hours as needed for pain.   b complex vitamins capsule Take 1 capsule by mouth daily.   benzonatate (TESSALON) 100 MG capsule Take 200 mg by mouth 3 (three) times daily as needed for cough.   brimonidine (ALPHAGAN) 0.2 % ophthalmic solution Place 1 drop into both eyes 2 (two) times daily.   clopidogrel (PLAVIX) 75 MG tablet Take 1 tablet (75 mg total) by mouth daily.   estradiol (ESTRING) 7.5 MCG/24HR vaginal ring Place 1 each vaginally every 3 (three) months.   famotidine (PEPCID) 20 MG tablet  TAKE ONE TABLET BY MOUTH TWICE A DAY   FARXIGA 5 MG TABS tablet TAKE 1 TABLET BY MOUTH DAILY BEFORE BREAKFAST   furosemide (LASIX) 80 MG tablet TAKE TWO TABLETS BY MOUTH DAILY   GEMTESA 75 MG TABS TAKE ONE TABLET BY MOUTH DAILY   glucose blood (CONTOUR NEXT TEST) test strip Use as instructed   golimumab (SIMPONI ARIA) 50 MG/4ML SOLN injection Inject into the vein. Every 8 weeks   KLOR-CON M20 20 MEQ tablet TAKE ONE TABLET BY MOUTH DAILY   Lancets MISC Check blood sugar once daily. Contour Next Device.   loratadine (CLARITIN) 10 MG tablet Take 10 mg by mouth daily.   losartan (COZAAR) 100 MG tablet Take 0.5 tablets (50 mg total) by mouth daily.   lovastatin (MEVACOR) 40 MG tablet Take 1 tablet (40 mg total) by mouth at bedtime.   Multiple Vitamins-Minerals (CENTRUM SILVER 50+WOMEN PO) Take 1 tablet by mouth daily.   mycophenolate (CELLCEPT) 500 MG tablet Take 500 mg by mouth 2 (two) times daily.   pramipexole (MIRAPEX) 0.25 MG tablet TAKE 1 TABLET BY MOUTH AT BEDTIME   prednisoLONE acetate (PRED FORTE) 1 % ophthalmic suspension Place 1 drop into both eyes in the morning and at bedtime.   sertraline (ZOLOFT) 25 MG tablet Take 1 tablet (25 mg total)  by mouth daily.   timolol (BETIMOL) 0.5 % ophthalmic solution Place 1 drop into the right eye 2 (two) times daily.   famciclovir (FAMVIR) 500 MG tablet Take 500 mg by mouth daily.    NON FORMULARY Take 900 mg by mouth in the morning.   No facility-administered encounter medications on file as of 03/18/2023.    Allergies (verified) Dorzolamide hcl-timolol mal, Tetanus toxoids, and Hctz [hydrochlorothiazide]   History: Past Medical History:  Diagnosis Date   Allergy    Arthritis    Bleeding of blood vessel    ext. 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; embolic   TIA (transient ischemic attack) 10/22/2013   Urine incontinence    Past Surgical History:  Procedure Laterality Date   ABDOMINAL HYSTERECTOMY     APPENDECTOMY  1983   CATARACT EXTRACTION Right May 13, 2014   EYE SURGERY  12/13/2018   stent inserted in left eye   gum transplant     RIGHT/LEFT HEART CATH AND CORONARY ANGIOGRAPHY N/A 05/12/2018   Procedure: RIGHT/LEFT HEART CATH AND CORONARY ANGIOGRAPHY;  Surgeon: Belva Crome, MD;  Location: [facility];  Service: Cardiovascular;  Laterality: N/A;   THORACIC AORTOGRAM N/A 05/12/2018   Procedure: THORACIC AORTOGRAM;  Surgeon: Belva Crome, MD;  Location: [facility];  Service: Cardiovascular;  Laterality: N/A;   TONSILLECTOMY     Family History  Problem Relation Age of Onset   Cancer Mother    Early death Mother    Heart disease Father    Arthritis Father    Early death Father    Hearing loss Father    Hypertension Father    Hyperlipidemia Father    Cancer Sister    Arthritis Sister    Arthritis Brother    Diabetes Brother    Heart attack Brother    Heart disease Brother    Hyperlipidemia Brother    Hypertension Brother    Diabetes Daughter    Heart disease Daughter    Hyperlipidemia Daughter    Hyperlipidemia Maternal Grandmother    Hypertension Maternal Grandmother    Stroke Maternal Grandmother    Alcohol abuse Maternal Grandfather    Early death Maternal Grandfather        tuberculosis   Arthritis Paternal Grandmother    Alcohol abuse Paternal Grandfather    Heart disease Paternal Grandfather    Hyperlipidemia Paternal Grandfather    Hypertension Paternal Grandfather    Stroke Paternal Grandfather    Alcohol abuse Sister    COPD Sister    Drug abuse Sister    Early death Sister    Heart disease Sister    Hypertension Sister    Hyperlipidemia Sister    Early death Brother    Early death Brother    Social  History   Socioeconomic History   Marital status: Divorced    Spouse name: Not on file   Number of children: 2   Years of education: college   Highest education level: Not on file  Occupational History    Employer: RETIRED  Tobacco Use   Smoking status: Former    Years: 62    Types: Cigarettes    Quit date: 11/05/1971    Years since quitting: 51.4   Smokeless tobacco: Never  Vaping Use   Vaping Use: Never used  Substance and Sexual Activity   Alcohol use: No    Alcohol/week: 0.0 standard drinks of alcohol   Drug use: No   Sexual activity: Not Currently  Other Topics Concern   Not on file  Social History Narrative   Patient lives at home alone   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   Worked at [employer] for 18 years   Daughters in [place] and in [place]   Caffeine Use: 2-3 cups daily   Social Determinants of Health   Financial Resource Strain: Low Risk  (03/18/2023)   Overall Financial Resource Strain (CARDIA)    Difficulty of Paying Living Expenses: Not hard at all  Food Insecurity: No Food Insecurity (03/18/2023)   Hunger Vital Sign    Worried About Running Out of Food in the Last Year: Never true    [place] in the Last Year: Never true  Transportation Needs: No Transportation Needs (03/18/2023)   PRAPARE - Hydrologist (Medical): No    Lack of Transportation (Non-Medical): No  Physical Activity: Inactive (03/18/2023)   Exercise Vital Sign    Days of Exercise per Week: 0 days    Minutes of Exercise per Session: 0 min  Stress: No Stress Concern Present (03/18/2023)   [place]    Feeling of Stress : Not at all  Social Connections: Moderately Isolated (03/18/2023)   Social Connection and Isolation Panel [NHANES]    Frequency of Communication with Friends and Family: More than three times a week    Frequency of Social Gatherings  with Friends and Family: More than three times a week    Attends Religious Services: 1 to 4 times per year    Active Member of [employer] or Organizations: Not on file    Attends Archivist Meetings: Never    Marital Status: Divorced    Tobacco Counseling Counseling given: Not Answered   Clinical Intake:  Pre-visit preparation completed: Yes  Pain : No/denies pain     BMI - recorded: 27.8 Nutritional Status: BMI 25 -29 Overweight Nutritional Risks: None Diabetes: Yes CBG done?: No Did pt. bring in CBG monitor from home?: No  How often do you need to have someone help you when you read instructions, pamphlets, or other written materials from your doctor or pharmacy?: 1 - Never  Diabetic?Nutrition Risk Assessment:  Has the patient had any N/V/D within the last 2 months?  No  Does the patient have any non-healing wounds?  No  Has the patient had any unintentional weight loss or weight gain?  No   Diabetes:  Is the patient diabetic?  Yes  If diabetic, was a CBG obtained today?  no Did the patient bring in their glucometer from home?  No  How often do you monitor your CBG's? N/a.   Financial Strains and Diabetes Management:  Are you having any financial strains with the device, your supplies or your medication? No .  Does the patient want to be seen by Chronic Care Management for management of their diabetes?  No  Would the patient like to be referred to a Nutritionist or for  Diabetic Management?  No   Diabetic Exams:  Diabetic Eye Exam: Completed 11/24/22 Diabetic Foot Exam: Completed 02/22/23   Interpreter Needed?: No  Information entered by :: Charlott Rakes, LPN   Activities of Daily Living     No data to display          Patient Care Team: Allwardt, Randa Evens, PA-C as PCP - General (Physician Assistant) Lorretta Harp, MD as PCP - Cardiology (Cardiology) Edythe Clarity, [facility] as Pharmacist (Pharmacist)  Indicate any recent Medical  Services you may have received from other than [facility] providers in the past year (date may be approximate).     Assessment:   This is a routine wellness examination for Rayelle.  Hearing/Vision screen Hearing Screening - Comments:: Pt wears hearing aids  Vision Screening - Comments:: Pt follow up with Dr Brigitte Pulse for annual eye exams   Dietary issues and exercise activities discussed:     Goals Addressed   None    Depression Screen    03/18/2023    1:59 PM 02/22/2023    8:00 AM 03/16/2022    8:16 AM 07/02/2021    2:42 PM 04/03/2021   12:56 PM 03/10/2021    8:15 AM 01/03/2021    8:05 AM  PHQ 2/9 Scores  PHQ - 2 Score 0 0 0 0 0 0 0  PHQ- 9 Score 0 2  0       Fall Risk    03/18/2023    1:59 PM 02/22/2023    7:59 AM 03/16/2022    8:19 AM 08/20/2021    1:25 PM 07/02/2021    2:42 PM  [place] in the past year? 0 0 0 0 0  Number falls in past yr: 0 0 0  0  Injury with Fall? 0 0 0  0  Risk for fall due to : Impaired vision No Fall Risks Impaired vision;Impaired mobility;Impaired balance/gait  Impaired mobility  Follow up Falls prevention discussed Falls evaluation completed Falls prevention discussed      FALL RISK PREVENTION PERTAINING TO THE HOME:  Any stairs in or around the home? No  If so, are there any without handrails? No  Home free of loose throw rugs in walkways, pet beds, electrical cords, etc? Yes  Adequate lighting in your home to reduce risk of falls? Yes   ASSISTIVE DEVICES UTILIZED TO PREVENT FALLS:  Life alert? No  Use of a cane, walker or w/c? No  Grab bars in the bathroom? Yes  Shower chair or bench in shower? Yes  Elevated toilet seat or a handicapped toilet? No   TIMED UP AND GO:  Was the test performed? No .  L Cognitive Function:    01/28/2015    2:22 PM 07/18/2014    2:16 PM  MMSE - Mini Mental State Exam  Orientation to time 5 5  Orientation to Place 5 5  Registration 3 3  Attention/ Calculation 4 2  Recall 2 3  Language- name 2 objects 2 2   Language- repeat 1 1  Language- follow 3 step command 3 3  Language- read & follow direction 1 1  Write a sentence 1 1  Copy design 1 0  Total score 28 26        03/18/2023    2:01 PM 03/16/2022    8:23 AM 03/10/2021    8:20 AM 01/30/2020    2:44 PM  6CIT Screen  What Year? 0 points 0 points 0 points  0 points  What month? 0 points 0 points 0 points 0 points  What time? 0 points 0 points  0 points  Count back from 20 0 points 0 points 0 points 0 points  Months in reverse 4 points 0 points 0 points 0 points  Repeat phrase 0 points 0 points 2 points 2 points  Total Score 4 points 0 points  2 points    Immunizations Immunization History  Administered Date(s) Administered   Fluad Quad(high Dose 65+) 09/29/2019, 10/03/2020, 08/03/2022   Influenza Split 08/30/2017   Influenza-Unspecified 09/28/2018, 10/16/2021   Moderna Covid-19 Vaccine Bivalent Booster 20yrs & up 08/03/2022   Moderna SARS-COV2 Booster Vaccination 01/31/2021, 10/16/2021   Moderna Sars-Covid-2 Vaccination 02/09/2020, 03/08/2020, 08/27/2020   Pneumococcal Conjugate-13 03/29/2015   Pneumococcal Polysaccharide-23 11/01/2019   RSV,unspecified 12/12/2022    TDAP status: Due, Education has been provided regarding the importance of this vaccine. Advised may receive this vaccine at local pharmacy or Health Dept. Aware to provide a copy of the vaccination record if obtained from local pharmacy or Health Dept. Verbalized acceptance and understanding.  Flu Vaccine status: Up to date  Pneumococcal vaccine status: Up to date  Covid-19 vaccine status: Completed vaccines  Qualifies for Shingles Vaccine? Yes   Zostavax completed No   Shingrix Completed?: No.    Education has been provided regarding the importance of this vaccine. Patient has been advised to call insurance company to determine out of pocket expense if they have not yet received this vaccine. Advised may also receive vaccine at local pharmacy or Health Dept.  Verbalized acceptance and understanding.  Screening Tests Health Maintenance  Topic Date Due   DTaP/Tdap/Td (1 - Tdap) Never done   Zoster Vaccines- Shingrix (1 of 2) Never done   COVID-19 Vaccine (5 - 2023-24 season) 09/28/2022   HEMOGLOBIN A1C  08/23/2023   MAMMOGRAM  10/22/2023   OPHTHALMOLOGY EXAM  11/25/2023   Diabetic kidney evaluation - eGFR measurement  02/23/2024   Diabetic kidney evaluation - Urine ACR  02/23/2024   FOOT EXAM  02/23/2024   Medicare Annual Wellness (AWV)  03/17/2024   Pneumonia Vaccine 12+ Years old  Completed   INFLUENZA VACCINE  Completed   DEXA SCAN  Completed   HPV VACCINES  Aged Out    Health Maintenance  Health Maintenance Due  Topic Date Due   DTaP/Tdap/Td (1 - Tdap) Never done   Zoster Vaccines- Shingrix (1 of 2) Never done   COVID-19 Vaccine (5 - 2023-24 season) 09/28/2022    Colorectal cancer screening: No longer required.   Mammogram status: Completed 10/21/22. Repeat every year  Bone density 12/26/10  Additional Screening:   Vision Screening: Recommended annual ophthalmology exams for early detection of glaucoma and other disorders of the eye. Is the patient up to date with their annual eye exam?  Yes  Who is the provider or what is the name of the office in which the patient attends annual eye exams? Dr Brigitte Pulse  If pt is not established with a provider, would they like to be referred to a provider to establish care? No .   Dental Screening: Recommended annual dental exams for proper oral hygiene  Community Resource Referral / Chronic Care Management: CRR required this visit?  No   CCM required this visit?  No      Plan:     I have personally reviewed and noted the following in the patient's chart:   Medical and social history Use of alcohol, tobacco or illicit  drugs  Current medications and supplements including opioid prescriptions. Patient is not currently taking opioid prescriptions. Functional ability and  status Nutritional status Physical activity Advanced directives List of other physicians Hospitalizations, surgeries, and ER visits in previous 12 months Vitals Screenings to include cognitive, depression, and falls Referrals and appointments  In addition, I have reviewed and discussed with patient certain preventive protocols, quality metrics, and best practice recommendations. A written personalized care plan for preventive services as well as general preventive health recommendations were provided to patient.     Willette Brace, LPN   [id number]   Nurse Notes: none

## 2023-03-23 NOTE — Progress Notes (Unsigned)
[facility]   Subjective:     Chief Complaint: No chief complaint on file.  History of Present Illness: Cheryl Wood is a 87 y.o. female with stress incontinence who presents for a pessary check. She is using a size #0 incontinence ring pessary. The pessary has been working well and she has no complaints. She is using vaginal estrogen. She was having daily vaginal bleeding, so we did not re-insert pessary at last visit. She has been doing nightly estrogen cream to support vaginal tissue healing and has gotten an E-string for placement to see if this reduces her vaginal bleeding with pessary use.   Past Medical History: Patient  has a past medical history of Allergy, Arthritis, Bleeding of blood vessel, Blood in stool, Chronic kidney disease, Depression, Glaucoma, Heart murmur, History of chicken pox, History of recurrent UTIs, History of stomach ulcers, Hyperlipidemia, Hypertension, Memory loss, Rheumatic fever, Seizures ([place]), Stroke ([place]), TIA (transient ischemic attack) (10/22/2013), and Urine incontinence.   Past Surgical History: She  has a past surgical history that includes Abdominal hysterectomy; gum transplant; Cataract extraction (Right, May 13, 2014); RIGHT/LEFT HEART CATH AND CORONARY ANGIOGRAPHY (N/A, 05/12/2018); THORACIC AORTOGRAM (N/A, 05/12/2018); Eye surgery (12/13/2018); Appendectomy (1983); and Tonsillectomy.   Medications: She has a current medication list which includes the following prescription(s): acetaminophen, b complex vitamins, benzonatate, brimonidine, clopidogrel, estradiol, famciclovir, famotidine, farxiga, furosemide, gemtesa, contour next test, simponi aria, klor-con m20, lancets, loratadine, losartan, lovastatin, multiple vitamins-minerals, mycophenolate, NON FORMULARY, pramipexole, prednisolone acetate, sertraline, and timolol.   Allergies: Patient is allergic to dorzolamide hcl-timolol mal, tetanus toxoids, and hctz [hydrochlorothiazide].    Social History: Patient  reports that she quit smoking about 51 years ago. Her smoking use included cigarettes. She has never used smokeless tobacco. She reports that she does not drink alcohol and does not use drugs.      Objective:    Physical Exam: LMP  (LMP Unknown)  Gen: No apparent distress, A&O x 3. Detailed Urogynecologic Evaluation:  Pelvic Exam: Normal external female genitalia; Bartholin's and Skene's glands normal in appearance; urethral meatus {urethra:24773}, no urethral masses or discharge. The pessary was noted to be {in place:24774}. It was removed and cleaned. Speculum exam revealed {vaginal lesions:24775} in the vagina. The pessary was replaced. It was comfortable to the patient and fit well.       No data to display          Laboratory Results: Urine dipstick shows: {ua dip:315374::"negative for all components"}.    Assessment/Plan:    Assessment: Cheryl Wood is a 87 y.o. with {PFD symptoms:24771} here for a pessary check. She is doing well.  Plan: She will {pessary plan:24776}. She will continue to use {lubricant:24777}. She will follow-up in *** {days/wks/mos/yrs:310907} for a pessary check or sooner as needed.  All questions were answered.   Time Spent:

## 2023-03-25 ENCOUNTER — Encounter: Payer: Self-pay | Admitting: Obstetrics and Gynecology

## 2023-03-25 ENCOUNTER — Ambulatory Visit (INDEPENDENT_AMBULATORY_CARE_PROVIDER_SITE_OTHER): Payer: Medicare HMO | Admitting: Obstetrics and Gynecology

## 2023-03-25 VITALS — BP 117/74 | HR 72

## 2023-03-25 DIAGNOSIS — Z4689 Encounter for fitting and adjustment of other specified devices: Secondary | ICD-10-CM

## 2023-03-25 DIAGNOSIS — N952 Postmenopausal atrophic vaginitis: Secondary | ICD-10-CM | POA: Diagnosis not present

## 2023-03-25 DIAGNOSIS — N393 Stress incontinence (female) (male): Secondary | ICD-10-CM

## 2023-03-25 NOTE — Patient Instructions (Signed)
You do not have to use the estrogen cream anymore. There may be a small amount of bleeding today which will stop.

## 2023-03-26 ENCOUNTER — Emergency Department (HOSPITAL_COMMUNITY): Payer: Medicare HMO

## 2023-03-26 ENCOUNTER — Emergency Department (HOSPITAL_COMMUNITY)
Admission: EM | Admit: 2023-03-26 | Discharge: 2023-03-27 | Disposition: A | Discharge status: Home / Self Care | Payer: Medicare HMO | Attending: Emergency Medicine | Admitting: Emergency Medicine

## 2023-03-26 ENCOUNTER — Encounter (HOSPITAL_COMMUNITY): Payer: Self-pay

## 2023-03-26 DIAGNOSIS — Z79899 Other long term (current) drug therapy: Secondary | ICD-10-CM | POA: Insufficient documentation

## 2023-03-26 DIAGNOSIS — H05231 Hemorrhage of right orbit: Secondary | ICD-10-CM

## 2023-03-26 DIAGNOSIS — Z955 Presence of coronary angioplasty implant and graft: Secondary | ICD-10-CM | POA: Insufficient documentation

## 2023-03-26 DIAGNOSIS — S4991XA Unspecified injury of right shoulder and upper arm, initial encounter: Secondary | ICD-10-CM

## 2023-03-26 DIAGNOSIS — Y93F2 Activity, caregiving, lifting: Secondary | ICD-10-CM | POA: Insufficient documentation

## 2023-03-26 DIAGNOSIS — S80212A Abrasion, left knee, initial encounter: Secondary | ICD-10-CM

## 2023-03-26 DIAGNOSIS — S199XXA Unspecified injury of neck, initial encounter: Secondary | ICD-10-CM | POA: Diagnosis not present

## 2023-03-26 DIAGNOSIS — S80211A Abrasion, right knee, initial encounter: Secondary | ICD-10-CM | POA: Diagnosis not present

## 2023-03-26 DIAGNOSIS — S0990XA Unspecified injury of head, initial encounter: Secondary | ICD-10-CM | POA: Diagnosis not present

## 2023-03-26 DIAGNOSIS — W108XXA Fall (on) (from) other stairs and steps, initial encounter: Secondary | ICD-10-CM

## 2023-03-26 DIAGNOSIS — S0181XA Laceration without foreign body of other part of head, initial encounter: Secondary | ICD-10-CM | POA: Diagnosis not present

## 2023-03-26 DIAGNOSIS — N189 Chronic kidney disease, unspecified: Secondary | ICD-10-CM | POA: Diagnosis not present

## 2023-03-26 DIAGNOSIS — I129 Hypertensive chronic kidney disease with stage 1 through stage 4 chronic kidney disease, or unspecified chronic kidney disease: Secondary | ICD-10-CM | POA: Insufficient documentation

## 2023-03-26 DIAGNOSIS — Z87891 Personal history of nicotine dependence: Secondary | ICD-10-CM | POA: Insufficient documentation

## 2023-03-26 DIAGNOSIS — S01111A Laceration without foreign body of right eyelid and periocular area, initial encounter: Secondary | ICD-10-CM | POA: Diagnosis not present

## 2023-03-26 DIAGNOSIS — Z043 Encounter for examination and observation following other accident: Secondary | ICD-10-CM | POA: Diagnosis not present

## 2023-03-26 DIAGNOSIS — S8001XA Contusion of right knee, initial encounter: Secondary | ICD-10-CM | POA: Diagnosis not present

## 2023-03-26 DIAGNOSIS — S4351XA Sprain of right acromioclavicular joint, initial encounter: Secondary | ICD-10-CM | POA: Diagnosis not present

## 2023-03-26 NOTE — ED Provider Triage Note (Signed)
Emergency Medicine Provider Triage Evaluation Note  Cheryl Wood , a 87 y.o. female  was evaluated in triage.  Pt complains of fall on blood thinners. States she was carrying a box downstairs when she tripped and fell, hitting her left forehead on the concrete. +lac. No LOC. Anticoagulated on plavix. Complains also of right shoulder pain..  Review of Systems  Positive: See above Negative:   Physical Exam  BP (!) 173/96 (BP Location: Right Arm)   Pulse 75   Temp 98.2 F (36.8 C) (Oral)   Resp 18   Ht 5\' 2"  (1.575 m)   Wt 68.9 kg   LMP  (LMP Unknown)   SpO2 98%   BMI 27.80 kg/m  Gen:   Awake, no distress   Resp:  Normal effort  MSK:   Moves extremities without difficulty  Other:  3 cm lac to the right upper lid, ecchymosis around the right eye.   Medical Decision Making  Medically screening exam initiated at 9:50 PM.  Appropriate orders placed.  Cheryl Wood was informed that the remainder of the evaluation will be completed by another provider, this initial triage assessment does not replace that evaluation, and the importance of remaining in the ED until their evaluation is complete.     Mickie Hillier, PA-C 03/26/23 2151

## 2023-03-26 NOTE — ED Triage Notes (Signed)
Pt arrived POV for fall from tripping going down 6 steps caring a box, pt hit her face on concrete, denies LOC, pt was wear glasses, glasses cut her right eyebrow, laceration noted, bleeding controlled. Pt has bruising to right side of face around her right eye, Pt also c/o right shoulder pain, and small abrasion to her left knee. Pt A&O x4 and ambulatory to triage. NAD noted. Pt is on Plavix.   Pt is allergic to tetanus injections/TDAP

## 2023-03-27 DIAGNOSIS — S0181XA Laceration without foreign body of other part of head, initial encounter: Secondary | ICD-10-CM | POA: Diagnosis not present

## 2023-03-27 DIAGNOSIS — S199XXA Unspecified injury of neck, initial encounter: Secondary | ICD-10-CM | POA: Diagnosis not present

## 2023-03-27 DIAGNOSIS — S0990XA Unspecified injury of head, initial encounter: Secondary | ICD-10-CM | POA: Diagnosis not present

## 2023-03-27 MED ORDER — LIDOCAINE-EPINEPHRINE (PF) 2 %-1:200000 IJ SOLN
10.0000 mL | Freq: Once | INTRAMUSCULAR | Status: AC
  Administered 2023-03-27: 10 mL via INTRADERMAL
  Filled 2023-03-27: qty 20

## 2023-03-27 NOTE — ED Provider Notes (Signed)
[facility] DEPT Provider Note: Cheryl Spurling, MD, FACEP  CSN: [id number] MRN: [id number] ARRIVAL: 03/26/23 at 2127 ROOM: [place] OF PRESENT ILLNESS  03/27/23 12:49 AM Cheryl Wood is a 87 y.o. female who tripped down 6 steps carrying a box just prior to arrival.  She hit her face on concrete but did not lose consciousness.  She was wearing glasses and the glasses cut her right eyebrow.  She has bruising to the right side of her face as well.  She is having pain in her right shoulder at the [facility] joint, and has superficial abrasions to both knees.  She is on Plavix.  She is allergic to Tdap.  She rates her pain as a 2 out of 10.  She has a stent in her right eye and intermittent erythema of the right eye which she states was already present before her injury.   Past Medical History:  Diagnosis Date   Allergy    Arthritis    Bleeding of blood vessel    ext. 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; embolic   TIA (transient ischemic attack) 10/22/2013   Urine incontinence     Past Surgical History:  Procedure Laterality Date   ABDOMINAL HYSTERECTOMY     APPENDECTOMY  1983   CATARACT EXTRACTION Right May 13, 2014   EYE SURGERY  12/13/2018   stent inserted in left eye   gum transplant     RIGHT/LEFT HEART CATH AND CORONARY ANGIOGRAPHY N/A 05/12/2018   Procedure: RIGHT/LEFT HEART CATH AND CORONARY ANGIOGRAPHY;  Surgeon: Belva Crome, MD;  Location: [place] [facility];  Service: Cardiovascular;  Laterality: N/A;   THORACIC AORTOGRAM N/A 05/12/2018   Procedure: THORACIC AORTOGRAM;  Surgeon: Belva Crome, MD;  Location: [place] [facility];  Service: Cardiovascular;  Laterality: N/A;    TONSILLECTOMY      Family History  Problem Relation Age of Onset   Cancer Mother    Early death Mother    Heart disease Father    Arthritis Father    Early death Father    Hearing loss Father    Hypertension Father    Hyperlipidemia Father    Cancer Sister    Arthritis Sister    Arthritis Brother    Diabetes Brother    Heart attack Brother    Heart disease Brother    Hyperlipidemia Brother    Hypertension Brother    Diabetes Daughter    Heart disease Daughter    Hyperlipidemia Daughter    Hyperlipidemia Maternal Grandmother    Hypertension Maternal Grandmother    Stroke Maternal Grandmother    Alcohol abuse Maternal Grandfather    Early death Maternal Grandfather        tuberculosis   Arthritis Paternal Grandmother    Alcohol abuse Paternal Grandfather    Heart disease Paternal Grandfather    Hyperlipidemia Paternal Grandfather    Hypertension Paternal Grandfather    Stroke Paternal Grandfather    Alcohol abuse Sister    COPD Sister  Drug abuse Sister    Early death Sister    Heart disease Sister    Hypertension Sister    Hyperlipidemia Sister    Early death Brother    Early death Brother     Social History   Tobacco Use   Smoking status: Former    Years: 101    Types: Cigarettes    Quit date: 11/05/1971    Years since quitting: 51.4   Smokeless tobacco: Never  Vaping Use   Vaping Use: Never used  Substance Use Topics   Alcohol use: No    Alcohol/week: 0.0 standard drinks of alcohol   Drug use: No    Prior to Admission medications   Medication Sig Start Date End Date Taking? Authorizing Provider  acetaminophen (TYLENOL) 650 MG CR tablet Take 650 mg by mouth every 8 (eight) hours as needed for pain.    [provider]  b complex vitamins capsule Take 1 capsule by mouth daily.    [provider]  benzonatate (TESSALON) 100 MG capsule Take 200 mg by mouth 3 (three) times daily as needed for cough.    [provider]   brimonidine (ALPHAGAN) 0.2 % ophthalmic solution Place 1 drop into both eyes 2 (two) times daily.    [provider]  clopidogrel (PLAVIX) 75 MG tablet Take 1 tablet (75 mg total) by mouth daily. 07/27/22   Allwardt, Randa Evens, PA-C  estradiol (ESTRING) 7.5 MCG/24HR vaginal ring Place 1 each vaginally every 3 (three) months. 03/09/23   Berton Mount, NP  famciclovir (FAMVIR) 500 MG tablet Take 500 mg by mouth daily.  01/24/19   [provider]  famotidine (PEPCID) 20 MG tablet TAKE ONE TABLET BY MOUTH TWICE A DAY 09/07/22   Allwardt, [name] M, PA-C  FARXIGA 5 MG TABS tablet TAKE 1 TABLET BY MOUTH DAILY BEFORE BREAKFAST 01/29/23   Allwardt, [name] M, PA-C  furosemide (LASIX) 80 MG tablet TAKE TWO TABLETS BY MOUTH DAILY 12/31/22   Allwardt, Randa Evens, PA-C  GEMTESA 75 MG TABS TAKE ONE TABLET BY MOUTH DAILY 09/07/22   Jaquita Folds, MD  glucose blood (CONTOUR NEXT TEST) test strip Use as instructed 08/15/21   Allwardt, [name] M, PA-C  golimumab (SIMPONI ARIA) 50 MG/4ML SOLN injection Inject into the vein. Every 8 weeks    [provider]  KLOR-CON M20 20 MEQ tablet TAKE ONE TABLET BY MOUTH DAILY 11/06/22   Allwardt, [name] M, PA-C  Lancets MISC Check blood sugar once daily. Contour Next Device. 08/15/21   Allwardt, Randa Evens, PA-C  loratadine (CLARITIN) 10 MG tablet Take 10 mg by mouth daily.    [provider]  losartan (COZAAR) 100 MG tablet Take 0.5 tablets (50 mg total) by mouth daily. 08/07/22   Almyra Deforest, PA  lovastatin (MEVACOR) 40 MG tablet Take 1 tablet (40 mg total) by mouth at bedtime. 02/23/23   Allwardt, Randa Evens, PA-C  Multiple Vitamins-Minerals (CENTRUM SILVER 50+WOMEN PO) Take 1 tablet by mouth daily.    [provider]  mycophenolate (CELLCEPT) 500 MG tablet Take 500 mg by mouth 2 (two) times daily. 10/03/21   [provider]  NON FORMULARY Take 900 mg by mouth in the morning.    [provider]  pramipexole (MIRAPEX) 0.25 MG  tablet TAKE 1 TABLET BY MOUTH AT BEDTIME 02/02/23   Allwardt, [name] M, PA-C  prednisoLONE acetate (PRED FORTE) 1 % ophthalmic suspension Place 1 drop into both eyes in the morning and at  bedtime. 01/18/21   [provider]  sertraline (ZOLOFT) 25 MG tablet Take 1 tablet (25 mg total) by mouth daily. 11/23/22   Allwardt, Randa Evens, PA-C  timolol (BETIMOL) 0.5 % ophthalmic solution Place 1 drop into the right eye 2 (two) times daily.    [provider]    Allergies Dorzolamide hcl-timolol mal, Tetanus toxoids, and Hctz [hydrochlorothiazide]   REVIEW OF SYSTEMS  Negative except as noted here or in the History of Present Illness.   PHYSICAL EXAMINATION  Initial Vital Signs Blood pressure (!) 173/96, pulse 75, temperature 98.2 F (36.8 C), temperature source Oral, resp. rate 18, height 5\' 2"  (1.575 m), weight 68.9 kg, SpO2 98 %.  Examination General: Well-developed, well-nourished female in no acute distress; appearance consistent with age of record HENT: normocephalic; right forehead laceration and right periorbital hematoma:    Eyes: pupils equal, round and reactive to light; extraocular muscles intact; right conjunctival injection; no hyphema; bilateral pseudophakia Neck: supple Heart: regular rate and rhythm Lungs: clear to auscultation bilaterally Abdomen: soft; nondistended; nontender; bowel sounds present Extremities: No deformity; tenderness of right shoulder AC joint without step-off Neurologic: Awake, alert and oriented; motor function intact in all extremities and symmetric; no facial droop Skin: Warm and dry; superficial abrasions of knees bilaterally Psychiatric: Normal mood and affect   RESULTS  Summary of this visit's results, reviewed and interpreted by myself:   EKG Interpretation  Date/Time:    Ventricular Rate:    PR Interval:    QRS Duration:   QT Interval:    QTC Calculation:   R Axis:     Text Interpretation:         Laboratory  Studies: No results found for this or any previous visit (from the past 24 hour(s)). Imaging Studies: CT Cervical Spine Wo Contrast  Result Date: 03/27/2023 CLINICAL DATA:  Neck trauma (Age >= 65y).  Fall. EXAM: CT CERVICAL SPINE WITHOUT CONTRAST TECHNIQUE: Multidetector CT imaging of the cervical spine was performed without intravenous contrast. Multiplanar CT image reconstructions were also generated. RADIATION DOSE REDUCTION: This exam was performed according to the departmental dose-optimization program which includes automated exposure control, adjustment of the mA and/or kV according to patient size and/or use of iterative reconstruction technique. COMPARISON:  None available FINDINGS: Alignment: No subluxation Skull base and vertebrae: No acute fracture. No primary bone lesion or focal pathologic process. Soft tissues and spinal canal: No prevertebral fluid or swelling. No visible canal hematoma. Disc levels: Diffuse advanced degenerative disc disease. Moderate degenerative facet disease bilaterally, right greater than left. Upper chest: No acute findings Other: None IMPRESSION: No acute bony abnormality. Electronically Signed   By: Rolm Baptise M.D.   On: 03/27/2023 00:11   CT Head Wo Contrast  Result Date: 03/27/2023 CLINICAL DATA:  Head trauma, minor (Age >= 65y) anticoagulated. Fall EXAM: CT HEAD WITHOUT CONTRAST TECHNIQUE: Contiguous axial images were obtained from the base of the skull through the vertex without intravenous contrast. RADIATION DOSE REDUCTION: This exam was performed according to the departmental dose-optimization program which includes automated exposure control, adjustment of the mA and/or kV according to patient size and/or use of iterative reconstruction technique. COMPARISON:  None Available. FINDINGS: Brain: There is atrophy and chronic small vessel disease changes. No acute intracranial abnormality. Specifically, no hemorrhage, hydrocephalus, mass lesion, acute  infarction, or significant intracranial injury. Vascular: No hyperdense vessel or unexpected calcification. Skull: No acute calvarial abnormality. Sinuses/Orbits: No acute findings Other: None IMPRESSION: Atrophy, chronic microvascular disease. No acute intracranial  abnormality. Electronically Signed   By: Rolm Baptise M.D.   On: 03/27/2023 00:10   DG Shoulder Right  Result Date: 03/26/2023 CLINICAL DATA:  Fall EXAM: RIGHT SHOULDER - 2+ VIEW COMPARISON:  None Available. FINDINGS: Degenerative changes in the [facility] joint with joint space narrowing and spurring. Glenohumeral joint is maintained. No acute bony abnormality. Specifically, no fracture, subluxation, or dislocation. Soft tissues are intact. IMPRESSION: Mild degenerative changes in the right AC joint. No acute bony abnormality. Electronically Signed   By: Rolm Baptise M.D.   On: 03/26/2023 22:19    ED COURSE and MDM  Nursing notes, initial and subsequent vitals signs, including pulse oximetry, reviewed and interpreted by myself.  Vitals:   03/26/23 2145  BP: (!) 173/96  Pulse: 75  Resp: 18  Temp: 98.2 F (36.8 C)  TempSrc: Oral  SpO2: 98%  Weight: 68.9 kg  Height: 5\' 2"  (1.575 m)   Medications  lidocaine-EPINEPHrine (XYLOCAINE W/EPI) 2 %-1:200000 (PF) injection 10 mL (has no administration in time range)    Physical examination is consistent with a first-degree right acromioclavicular joint injury.  We will place her in a sling.  Her facial laceration was closed as described below.  She was advised to contact her ophthalmologist Monday (the day after tomorrow) to have her eye formally evaluated.  PROCEDURES  Procedures LACERATION REPAIR Performed by: Karen Chafe Vashawn Ekstein Authorized by: Karen Chafe Akiya Morr Consent: Verbal consent obtained. Risks and benefits: risks, benefits and alternatives were discussed Consent given by: patient Patient identity confirmed: provided demographic data Prepped and Draped in normal sterile fashion Wound  explored  Laceration Location: Right forehead  Laceration Length: 2.5 cm  No Foreign Bodies seen or palpated  Anesthesia: local infiltration  Local anesthetic: lidocaine 2% with epinephrine  Anesthetic total: 5 ml  Irrigation method: syringe Amount of cleaning: standard  Skin closure: 5-0 Ethilon  Number of sutures: 1  Technique: Running  Patient tolerance: Patient tolerated the procedure well with no immediate complications.    ED DIAGNOSES     ICD-10-CM   1. Fall down steps, initial encounter  W10.8XXA     2. Acromioclavicular joint injury, right, initial encounter  S49.91XA     3. Periorbital hematoma of right eye  H05.231     4. Forehead laceration, initial encounter  S01.81XA     5. Abrasion, left knee, initial encounter  S80.212A     6. Abrasion, right knee, initial encounter  [id number]          Shanon Rosser, MD 03/27/23 [phone]

## 2023-03-27 NOTE — Discharge Instructions (Signed)
You should contact your ophthalmologist Monday regarding evaluation of your right eye.  Because that I has had surgery on it your ophthalmologist may wish to see you sooner to make sure you did not damage the stent.

## 2023-03-31 ENCOUNTER — Telehealth: Payer: Self-pay

## 2023-03-31 NOTE — Telephone Encounter (Signed)
        Patient  visited [place] on 3/30   Telephone encounter attempt :  1st  A HIPAA compliant voice message was left requesting a return call.  Instructed patient to call back    [facility] [phone] [address], [place], [place] [address] Phone: [phone] Email: [email or web address]

## 2023-04-01 ENCOUNTER — Telehealth: Payer: Self-pay

## 2023-04-01 DIAGNOSIS — H903 Sensorineural hearing loss, bilateral: Secondary | ICD-10-CM | POA: Diagnosis not present

## 2023-04-01 NOTE — Telephone Encounter (Signed)
     Patient  visit on 3/30  at [place] you been able to follow up with your primary care physician? Yes   The patient was or was not able to obtain any needed medicine or equipment. Na   Are there diet recommendations that you are having difficulty following? Na   Patient expresses understanding of discharge instructions and education provided has no other needs at this time.     [facility] [phone] [address], [place], [place] [address] Phone: [phone] Email: [email or web address]

## 2023-04-08 ENCOUNTER — Ambulatory Visit (INDEPENDENT_AMBULATORY_CARE_PROVIDER_SITE_OTHER): Payer: Medicare HMO | Admitting: Physician Assistant

## 2023-04-08 VITALS — BP 136/82 | HR 70 | Temp 97.1°F | Ht 62.0 in | Wt 153.6 lb

## 2023-04-08 DIAGNOSIS — S0181XS Laceration without foreign body of other part of head, sequela: Secondary | ICD-10-CM | POA: Diagnosis not present

## 2023-04-08 DIAGNOSIS — W108XXS Fall (on) (from) other stairs and steps, sequela: Secondary | ICD-10-CM | POA: Diagnosis not present

## 2023-04-08 DIAGNOSIS — M8580 Other specified disorders of bone density and structure, unspecified site: Secondary | ICD-10-CM | POA: Insufficient documentation

## 2023-04-08 NOTE — Progress Notes (Signed)
Subjective:    Patient ID: Cheryl Wood, female    DOB: 1936/04/03, 87 y.o.   MRN: [id number]  Chief Complaint  Patient presents with   Follow-up    Pt in office for ED follow up and suture removal; pt states she fell down steps by not raising right foot high enough and acknowledges this is a issue at times since her stroke; sutures have been in 13 days today; pt states no other concerns to discuss;     HPI Patient is in today for ED f/up from 03/26/23. She tripped and fell down 6 steps carrying a box. No LOC. Glasses cut her R eyebrow - one running suture placed. R shoulder, CT head, CT spine all w/o acute findings.   Headache for a few days initially after the fall, but no longer having any issues. No nausea, no dizziness. No change in vision. Denies any pain anywhere else.  Still taking Plavix as directed. No bleeding issues.  Last bone scan in 2011 showing osteopenia.   Past Medical History:  Diagnosis Date   Allergy    Arthritis    Bleeding of blood vessel    ext. genitalia area   Blood in stool    Chronic kidney disease    Depression    Glaucoma    Heart murmur    History of chicken pox    History of recurrent UTIs    History of stomach ulcers    Hyperlipidemia    Hypertension    Memory loss    Rheumatic fever    Seizures ([facility])    Stroke ([facility])    2007; embolic   TIA (transient ischemic attack) 10/22/2013   Urine incontinence     Past Surgical History:  Procedure Laterality Date   ABDOMINAL HYSTERECTOMY     APPENDECTOMY  1983   CATARACT EXTRACTION Right May 13, 2014   EYE SURGERY  12/13/2018   stent inserted in left eye   gum transplant     RIGHT/LEFT HEART CATH AND CORONARY ANGIOGRAPHY N/A 05/12/2018   Procedure: RIGHT/LEFT HEART CATH AND CORONARY ANGIOGRAPHY;  Surgeon: Lyn Records, MD;  Location: [facility];  Service: Cardiovascular;  Laterality: N/A;   THORACIC AORTOGRAM N/A 05/12/2018   Procedure: THORACIC AORTOGRAM;  Surgeon: Lyn Records,  MD;  Location: [facility];  Service: Cardiovascular;  Laterality: N/A;   TONSILLECTOMY      Family History  Problem Relation Age of Onset   Cancer Mother    Early death Mother    Heart disease Father    Arthritis Father    Early death Father    Hearing loss Father    Hypertension Father    Hyperlipidemia Father    Cancer Sister    Arthritis Sister    Arthritis Brother    Diabetes Brother    Heart attack Brother    Heart disease Brother    Hyperlipidemia Brother    Hypertension Brother    Diabetes Daughter    Heart disease Daughter    Hyperlipidemia Daughter    Hyperlipidemia Maternal Grandmother    Hypertension Maternal Grandmother    Stroke Maternal Grandmother    Alcohol abuse Maternal Grandfather    Early death Maternal Grandfather        tuberculosis   Arthritis Paternal Grandmother    Alcohol abuse Paternal Grandfather    Heart disease Paternal Grandfather    Hyperlipidemia Paternal Grandfather    Hypertension Paternal Actor  Stroke Paternal Grandfather    Alcohol abuse Sister    COPD Sister    Drug abuse Sister    Early death Sister    Heart disease Sister    Hypertension Sister    Hyperlipidemia Sister    Early death Brother    Early death Brother     Social History   Tobacco Use   Smoking status: Former    Years: 40    Types: Cigarettes    Quit date: 11/05/1971    Years since quitting: 51.4   Smokeless tobacco: Never  Vaping Use   Vaping Use: Never used  Substance Use Topics   Alcohol use: No    Alcohol/week: 0.0 standard drinks of alcohol   Drug use: No     Allergies  Allergen Reactions   Dorzolamide Hcl-Timolol Mal Other (See Comments)    Red eyes   Tetanus Toxoids Swelling    Arm was twice the size it should be   Hctz [Hydrochlorothiazide] Rash    Review of Systems NEGATIVE UNLESS OTHERWISE INDICATED IN HPI      Objective:     BP 136/82 (BP Location: Right Arm)   Pulse 70   Temp (!) 97.1 F (36.2 C)  (Temporal)   Ht 5\' 2"  (1.575 m)   Wt 153 lb 9.6 oz (69.7 kg)   LMP  (LMP Unknown)   SpO2 96%   BMI 28.09 kg/m   Wt Readings from Last 3 Encounters:  04/08/23 153 lb 9.6 oz (69.7 kg)  03/26/23 152 lb (68.9 kg)  03/18/23 152 lb (68.9 kg)    BP Readings from Last 3 Encounters:  04/08/23 136/82  03/26/23 (!) 173/96  03/25/23 117/74     Physical Exam Vitals and nursing note reviewed.  Constitutional:      Appearance: Normal appearance.  Eyes:     Extraocular Movements: Extraocular movements intact.     Conjunctiva/sclera: Conjunctivae normal.     Pupils: Pupils are equal, round, and reactive to light.  Cardiovascular:     Rate and Rhythm: Normal rate and regular rhythm.  Pulmonary:     Effort: Pulmonary effort is normal.     Breath sounds: Normal breath sounds.  Skin:    Comments: Linear healed laceration R eyebrow, approx 2.5 cm long, running suture in place  Neurological:     General: No focal deficit present.     Mental Status: She is alert and oriented to person, place, and time.     Cranial Nerves: No cranial nerve deficit.     Sensory: No sensory deficit.     Motor: No weakness.     Gait: Gait normal.  Psychiatric:        Mood and Affect: Mood normal.        Behavior: Behavior normal.        Assessment & Plan:  Fall down steps, sequela  Forehead laceration, sequela  Osteopenia, unspecified location Assessment & Plan: Last bone density done in 2011, reviewed today, showing osteopenia; with patient's recent fall, discussed we should update this scan. Order placed today. She is taking OTC Vit D and Ca supplement daily. Thankfully, no fractures.   Orders: -     DG Bone Density; Future     Pt recovering well from ED visit. No further symptoms. No further falls. Fall precautions discussed with pt and daughter who is here today. She will be moving into a more accessible apartment with walk-in shower.  Procedure: Running suture removed from laceration as  described in PE. Pt tolerated well. Healing well, skin has approximated well. Will continue Vit E ointment, avoid sun.    F/up as scheduled.     Sherell Christoffel M Teryl Gubler, PA-C

## 2023-04-08 NOTE — Assessment & Plan Note (Signed)
Last bone density done in 2011, reviewed today, showing osteopenia; with patient's recent fall, discussed we should update this scan. Order placed today. She is taking OTC Vit D and Ca supplement daily. Thankfully, no fractures.

## 2023-04-12 ENCOUNTER — Telehealth: Payer: Self-pay | Admitting: Pharmacist

## 2023-04-12 NOTE — Progress Notes (Signed)
Care Management & Coordination Services Pharmacy Team  Reason for Encounter: Appointment Reminder  Contacted patient to confirm telephone appointment with Erskine Emery, PharmD on 04/13/2023 at 8 am. Spoke with patient on 04/12/2023     Star Rating Drugs:  Farxiga 5 mg last filled 02/02/2023 90 DS Losartan Potassium 100 mg last filled 02/02/2023 90 DS Lovastatin 40 mg last filled 02/23/2023 90 DS   Care Gaps: Annual wellness visit in last year? Yes  If Diabetic: Last eye exam / retinopathy screening: 11/22/2022 Last diabetic foot exam: 02/22/2023   Future Appointments  Date Time Provider Department Center  04/13/2023  8:00 AM Erroll Luna, [facility] None  06/23/2023  8:00 AM Allwardt, Crist Infante, PA-C [facility]  06/28/2023  9:20 AM Selmer Dominion, NP [facility]  03/21/2024  2:30 PM [facility] ANNUAL WELLNESS VISIT 1 [facility]   April D Calhoun, [facility] Clinical Pharmacist Assistant [phone]

## 2023-04-13 ENCOUNTER — Ambulatory Visit: Payer: Medicare HMO | Admitting: Pharmacist

## 2023-04-13 NOTE — Progress Notes (Signed)
Care Management & Coordination Services Pharmacy Note  04/13/2023 Name:  Cheryl Wood MRN:  [id number] DOB:  [birth year]  Summary: PharmD FU visit.  Patient with recent fall - no fractures.  She has DEXA ordered but not yet scheduled.  Discussed the importance of this.  Patient agreeable to call and schedule DEXA scan.  Fall mechanical in nature so no concern with BP, etc.  Recommendations/Changes made from today's visit: DEXA scheduling  Follow up plan: FU 6 months CMA to check in 1 month to ensure she scheduled DEXA   Subjective: Cheryl Wood is an 87 y.o. year old female who is a primary patient of Allwardt, [name] M, PA-C.  The care coordination team was consulted for assistance with disease management and care coordination needs.    Engaged with patient by telephone for follow up visit.  Chart Updates: Recent office visits:  None   Recent consult visits:  01/12/2023 [facility] (Cardiology) Runell Gess, MD; no medication changes indicated.   Hospital visits:  03/26/23 - ED visit for fall down her steps.  No fractures or broken bones.  Just bruising on her face.   Objective:  Lab Results  Component Value Date   CREATININE 1.07 02/22/2023   BUN 26 (H) 02/22/2023   GFR 47.59 (L) 02/22/2023   GFRNONAA 42 (L) 06/12/2022   GFRAA 56 (L) 10/03/2020   NA 145 02/22/2023   K 4.1 02/22/2023   CALCIUM 10.0 02/22/2023   CO2 32 02/22/2023   GLUCOSE 119 (H) 02/22/2023    Lab Results  Component Value Date/Time   HGBA1C 6.8 (H) 02/22/2023 09:05 AM   HGBA1C 6.6 (A) 06/16/2022 08:33 AM   HGBA1C 7.3 (H) 12/08/2021 09:44 AM   HGBA1C 7.0 11/29/2019 12:00 AM   GFR 47.59 (L) 02/22/2023 09:05 AM   GFR 50.24 (L) 12/08/2021 09:44 AM   MICROALBUR 1.2 02/22/2023 09:05 AM   MICROALBUR <0.7 07/03/2020 09:08 AM    Last diabetic Eye exam:  Lab Results  Component Value Date/Time   HMDIABEYEEXA Retinopathy (A) 11/24/2022 12:00 AM    Last diabetic Foot exam: No results found for:  "HMDIABFOOTEX"   Lab Results  Component Value Date   CHOL 215 (H) 02/22/2023   HDL 45.50 02/22/2023   LDLCALC 131 (H) 02/22/2023   LDLDIRECT 136.0 01/25/2019   TRIG 192.0 (H) 02/22/2023   CHOLHDL 5 02/22/2023       Latest Ref Rng & Units 02/22/2023    9:05 AM 12/08/2021    9:44 AM 03/28/2021    9:50 AM  Hepatic Function  Total Protein 6.0 - 8.3 g/dL 6.8  6.9  7.1   Albumin 3.5 - 5.2 g/dL 4.2  4.4  4.3   AST 0 - 37 U/L ALT 0 - 35 U/L Alk Phosphatase 39 - 117 U/L 73  50  57   Total Bilirubin 0.2 - 1.2 mg/dL 1.0  0.8  1.5     Lab Results  Component Value Date/Time   TSH 0.975 06/23/2022 03:53 PM   TSH 1.82 11/29/2019 12:00 AM   TSH 1.81 10/13/2019 08:38 AM       Latest Ref Rng & Units 06/12/2022    4:08 PM 12/08/2021    9:44 AM 08/28/2021   10:11 AM  CBC  WBC 4.0 - 10.5 K/uL 6.1  5.9  8.8   Hemoglobin 12.0 - 15.0 g/dL 11.9  14.7  82.9   Hematocrit  36.0 - 46.0 % 43.8  43.2  39.7   Platelets 150 - 400 K/uL 176  172.0  124     Lab Results  Component Value Date/Time   VITAMINB12 525 01/01/2020 08:45 AM   VITAMINB12 607 11/29/2019 12:00 AM    Clinical ASCVD: Yes  The ASCVD Risk score (Arnett DK, et al., 2019) failed to calculate for the following reasons:   The 2019 ASCVD risk score is only valid for ages 87 to 68   The patient has a prior MI or stroke diagnosis        04/08/2023    9:01 AM 03/18/2023    1:59 PM 02/22/2023    8:00 AM  Depression screen PHQ 2/9  Decreased Interest 0 0 0  Down, Depressed, Hopeless 0 0 0  PHQ - 2 Score 0 0 0  Altered sleeping 0 0 0  Tired, decreased energy 0 0 2  Change in appetite 0 0 0  Feeling bad or failure about yourself  0 0 0  Trouble concentrating 0 0 0  Moving slowly or fidgety/restless 0 0 0  Suicidal thoughts 0 0 0  PHQ-9 Score 0 0 2  Difficult doing work/chores Not difficult at all Not difficult at all Somewhat difficult     Social History   Tobacco Use  Smoking Status Former   Years:  40   Types: Cigarettes   Quit date: 11/05/1971   Years since quitting: 51.4  Smokeless Tobacco Never   BP Readings from Last 3 Encounters:  04/08/23 136/82  03/26/23 (!) 173/96  03/25/23 117/74   Pulse Readings from Last 3 Encounters:  04/08/23 70  03/26/23 75  03/25/23 72   Wt Readings from Last 3 Encounters:  04/08/23 153 lb 9.6 oz (69.7 kg)  03/26/23 152 lb (68.9 kg)  03/18/23 152 lb (68.9 kg)   BMI Readings from Last 3 Encounters:  04/08/23 28.09 kg/m  03/26/23 27.80 kg/m  03/18/23 27.80 kg/m    Allergies  Allergen Reactions   Dorzolamide Hcl-Timolol Mal Other (See Comments)    Red eyes   Tetanus Toxoids Swelling    Arm was twice the size it should be   Hctz [Hydrochlorothiazide] Rash    Medications Reviewed Today     Reviewed by Erroll Luna, [facility] (Pharmacist) on 04/13/23 at 1620  Med List Status: <None>   Medication Order Taking? Sig Documenting Provider Last Dose Status Informant  acetaminophen (TYLENOL) 650 MG CR tablet [id number] No Take 650 mg by mouth every 8 (eight) hours as needed for pain. [provider] Taking Active Multiple Informants  b complex vitamins capsule [id number] No Take 1 capsule by mouth daily. [provider] Taking Active   benzonatate (TESSALON) 100 MG capsule [id number] No Take 200 mg by mouth 3 (three) times daily as needed for cough. [provider] Taking Active   brimonidine (ALPHAGAN) 0.2 % ophthalmic solution [id number] No Place 1 drop into both eyes 2 (two) times daily. [provider] Taking Active Multiple Informants  clopidogrel (PLAVIX) 75 MG tablet [id number] No Take 1 tablet (75 mg total) by mouth daily. Allwardt, Crist Infante, PA-C Taking Active   estradiol (ESTRING) 7.5 MCG/24HR vaginal ring [id number] No Place 1 each vaginally every 3 (three) months. Selmer Dominion, NP Taking Active   famciclovir [facility]) 500 MG tablet [id number] No Take 500 mg by mouth daily.  [provider]  Taking Active Multiple Informants  famotidine (PEPCID) 20 MG tablet [id number] No TAKE ONE TABLET BY  MOUTH TWICE A DAY Allwardt, Crist Infante, PA-C Taking Active   FARXIGA 5 MG TABS tablet [id number] No TAKE 1 TABLET BY MOUTH DAILY BEFORE BREAKFAST Allwardt, [name] M, PA-C Taking Active   furosemide (LASIX) 80 MG tablet [id number] No TAKE TWO TABLETS BY MOUTH DAILY Allwardt, Crist Infante, PA-C Taking Active   GEMTESA 75 MG TABS [id number] No TAKE ONE TABLET BY MOUTH DAILY Marguerita Beards, MD Taking Active   glucose blood (CONTOUR NEXT TEST) test strip [id number] No Use as instructed Allwardt, Crist Infante, PA-C Taking Active Multiple Informants  golimumab (SIMPONI ARIA) 50 MG/4ML SOLN injection [id number] No Inject into the vein. Every 8 weeks [provider] Taking Active   KLOR-CON M20 20 MEQ tablet [id number] No TAKE ONE TABLET BY MOUTH DAILY Allwardt, Crist Infante, PA-C Taking Active   Lancets MISC [id number] No Check blood sugar once daily. Contour Next Device. Allwardt, Crist Infante, PA-C Taking Active Multiple Informants  loratadine (CLARITIN) 10 MG tablet [id number] No Take 10 mg by mouth daily. [provider] Taking Active Multiple Informants  losartan (COZAAR) 100 MG tablet [id number] No Take 0.5 tablets (50 mg total) by mouth daily. Azalee Course, [place] Taking Active   lovastatin (MEVACOR) 40 MG tablet [id number] No Take 1 tablet (40 mg total) by mouth at bedtime. Allwardt, Crist Infante, PA-C Taking Active   Multiple Vitamins-Minerals (CENTRUM SILVER 50+WOMEN PO) [id number] No Take 1 tablet by mouth daily. [provider] Taking Active Multiple Informants  mycophenolate (CELLCEPT) 500 MG tablet [id number] No Take 500 mg by mouth 2 (two) times daily. [provider] Taking Active            Med Note Dione Housekeeper, Kathreen Cosier   Tue Jun 23, 2022  2:18 PM) Patient takes 500 mg daily  NON FORMULARY [id number] No Take 900 mg by mouth in the morning. [provider] Taking Active   pramipexole  (MIRAPEX) 0.25 MG tablet [id number] No TAKE 1 TABLET BY MOUTH AT BEDTIME Allwardt, [name] M, PA-C Taking Active   prednisoLONE acetate (PRED FORTE) 1 % ophthalmic suspension [id number] No Place 1 drop into both eyes in the morning and at bedtime. [provider] Taking Active Multiple Informants           Med Note Judithann Graves, [facility]   Fri Mar 28, 2021 11:22 AM)    sertraline (ZOLOFT) 25 MG tablet [id number] No Take 1 tablet (25 mg total) by mouth daily. Allwardt, Crist Infante, PA-C Taking Active   timolol (BETIMOL) 0.5 % ophthalmic solution [id number] No Place 1 drop into the right eye 2 (two) times daily. [provider] Taking Active Multiple Informants            SDOH:  (Social Determinants of Health) assessments and interventions performed: No, done within the past year Financial Resource Strain: Low Risk  (03/18/2023)   Overall Financial Resource Strain (CARDIA)    Difficulty of Paying Living Expenses: Not hard at all   Food Insecurity: No Food Insecurity (03/18/2023)   Hunger Vital Sign    Worried About Running Out of Food in the Last Year: Never true    Ran Out of Food in the Last Year: Never true    SDOH Interventions    Flowsheet Row Clinical Support from 03/18/2023 in [facility] from 02/22/2023 in [facility]  SDOH Interventions    Food Insecurity Interventions Intervention Not Indicated --  Housing Interventions Intervention Not Indicated --  Transportation Interventions Intervention Not Indicated --  Utilities Interventions Intervention Not Indicated --  Depression Interventions/Treatment  -- Currently on Treatment  Financial Strain Interventions Intervention Not Indicated --  Physical Activity Interventions Intervention Not Indicated --  Stress Interventions Intervention Not Indicated --  Social Connections Interventions Intervention Not Indicated --       Medication Assistance: None required.   Patient affirms current coverage meets needs.  Medication Access: Within the past 30 days, how often has patient missed a dose of medication? 0 Is a pillbox or other method used to improve adherence? No  Factors that may affect medication adherence? no barriers identified Are meds synced by current pharmacy? No  Are meds delivered by current pharmacy? No  Does patient experience delays in picking up medications due to transportation concerns? No   [facility] Services Reviewed: Is patient disadvantaged to use [facility]?: Yes  Current Rx insurance plan: [employer] Name and location of Current pharmacy:  [id number] [place], [place] - [address] [place] [address] Phone: [phone] Fax: [phone]  [facility] services reviewed with patient today?: Yes  Patient requests to transfer care to [facility]?: No  Reason patient declined to change pharmacies: Disadvantaged due to insurance/mail order  Compliance/Adherence/Medication fill history: Farxiga 5 mg last filled 02/02/2023 90 DS Losartan Potassium 100 mg last filled 02/02/2023 90 DS Lovastatin 40 mg last filled 02/23/2023 90 DS     Care Gaps: Annual wellness visit in last year? Yes   If Diabetic: Last eye exam / retinopathy screening: 11/22/2022 Last diabetic foot exam: 02/22/2023   Assessment/Plan     Hypertension (BP goal <130/80) 04/13/23 -Controlled -Current treatment: Losartan 50 mg daily Appropriate, Effective, Safe, Accessible -Medications previously tried: amlodipine  -Current home readings: controlled in office -Current dietary habits: see DM -Current exercise habits: not much lately due to sciatica pain -Denies hypotensive/hypertensive symptoms -Educated on BP goals and benefits of medications for prevention of heart attack, stroke and kidney damage; Daily salt intake goal < 2300 mg; Exercise goal of 150 minutes per week; Importance of  home blood pressure monitoring; -Counseled to monitor BP at home as current, document, and provide log at future appointments - Recently cut BP med in half due to soft BP. -BP continues to be controlled.  Recent fall was mechanical in nature so no concerns with hypotension, etc.   Hyperlipidemia: (LDL goal < 70) -Not ideally controlled, not assessed -Current treatment: Lovastatin  daily Appropriate, Query effective, -Medications previously tried: none noted  -Current dietary patterns: see DM -Current exercise habits: minimal -Educated on Cholesterol goals;  Benefits of statin for ASCVD risk reduction; Importance of limiting foods high in cholesterol; -LDL was elevated at last check.  Currently on low intensity statin. -Pt with DM consider at least moderate intensity. -Recommended to continue current medication Recheck lipid panel at [facility] in December, if remains elevated could consider increase to  or other mod intensity statin.  Update 06/08/22 Continues on same dose of Lovastatin. Lipid panel improved but LDL was slightly above goal 100. Encouraged her to continue adherence, continue to focus on healthy eating habits. No changes to medication at this time - room for dose titration to  of lovastatin if needed. FU at next annual labs.  Mechanical Fall (Goal: Prevent fracture) 04/13/23 -Not ideally controlled -Current treatment  None -Medications previously tried: none noted -Patient has not had a recent  DEXA scan.  One has been ordered on 04/08/23 by PCP.  Patient has not scheduled to have  this completed.  Reminded her to call to schedule visit today.  Patient agreeable to plan. -Recommended DEXA scan, pending results may need to treat.   Diabetes (A1c goal <7%) 12/11/22 -Controlled, most recent A1c is at goal, not assessed today -Current medications: Farxiga 5mg  daily - started in August  Appropriate, Effective, Safe, Accessible -Medications previously tried: none  noted  -Current home glucose readings fasting glucose: 114 most recently when she checked post prandial glucose: N/A -Denies hypoglycemic/hyperglycemic symptoms -Current meal patterns:  Same see previous - does report that she is taking care of a dog Thursday - Sunday walking it 3-4 times per day. -Current exercise: minimal due to pain -Educated on A1c and blood sugar goals; Benefits of routine self-monitoring of blood sugar; -Counseled to check feet daily and get yearly eye exams --Due for Foot exam, diabetic kidney eval, eye exam -Counseled her to schedule a FU visit with [name] at the first of the year to recheck A1c and update labs.  Update 06/08/22 A1c improved to 7.3 after addition of [place]. No changes were made.  She continues to feel very good.  [place] call. Not checking glucose very much but when she did check last fasting her readings were in the 120s. Continue as directed.  Could consider dose increase to 10mg  in the future but I am happy where her A1c is currently. Continue to work on diet/exercise. She is due for FOOT EXAM, EYE EXAM, and A1c recheck.  Depression/Anxiety (Goal: Minimize symptoms) -Controlled, not assessed -Current treatment: Sertraline 25mg  daily -Medications previously tried/failed: Lorazepam -PHQ9:  PHQ9 SCORE ONLY 07/02/2021 04/03/2021 03/10/2021  PHQ-9 Total Score 0 0 0    -Educated on Benefits of medication for symptom control -Recommended to continue current medication Reports sleep is good, no complaints of depressive symptoms.              Willa Frater, PharmD Clinical Pharmacist  [facility] [phone]

## 2023-04-15 ENCOUNTER — Other Ambulatory Visit: Payer: Self-pay | Admitting: Physician Assistant

## 2023-04-15 DIAGNOSIS — M8580 Other specified disorders of bone density and structure, unspecified site: Secondary | ICD-10-CM

## 2023-04-26 DIAGNOSIS — M0600 Rheumatoid arthritis without rheumatoid factor, unspecified site: Secondary | ICD-10-CM | POA: Diagnosis not present

## 2023-04-29 ENCOUNTER — Other Ambulatory Visit: Payer: Self-pay | Admitting: Physician Assistant

## 2023-05-18 DIAGNOSIS — H35033 Hypertensive retinopathy, bilateral: Secondary | ICD-10-CM | POA: Diagnosis not present

## 2023-05-18 DIAGNOSIS — H209 Unspecified iridocyclitis: Secondary | ICD-10-CM | POA: Diagnosis not present

## 2023-05-18 DIAGNOSIS — Z961 Presence of intraocular lens: Secondary | ICD-10-CM | POA: Diagnosis not present

## 2023-05-18 DIAGNOSIS — E119 Type 2 diabetes mellitus without complications: Secondary | ICD-10-CM | POA: Diagnosis not present

## 2023-05-18 DIAGNOSIS — H4042X2 Glaucoma secondary to eye inflammation, left eye, moderate stage: Secondary | ICD-10-CM | POA: Diagnosis not present

## 2023-05-20 ENCOUNTER — Telehealth: Payer: Self-pay | Admitting: Pharmacist

## 2023-05-20 NOTE — Progress Notes (Signed)
Care Management & Coordination Services Pharmacy Team  Reason for Encounter: General adherence update   Contacted patient for general health update and medication adherence call.  Spoke with patient on 05/20/2023    What concerns do you have about your medications? none  The patient denies side effects with their medications.   How often do you forget or accidentally miss a dose? Rarely  Do you use a pillbox? No  Are you having any problems getting your medications from your pharmacy? No  Has the cost of your medications been a concern? No If yes, what medication and is patient assistance available or has it been applied for?  Since last visit with PharmD, no interventions have been made.   The patient has not had an ED visit since last contact.   The patient denies problems with their health.   Patient denies concerns or questions for Erskine Emery, PharmD at this time.   Counseled patient on: Access to carecoordination team for any cost, medication or pharmacy concerns.  Patient has scheduled DEXA for 10/27/2023.   Chart Updates:  Recent office visits:  None since last CPP visit  Recent consult visits:  None since last CPP visit  Hospital visits:  None in previous 6 months  Medications: Outpatient Encounter Medications as of 05/20/2023  Medication Sig Note   acetaminophen (TYLENOL) 650 MG CR tablet Take 650 mg by mouth every 8 (eight) hours as needed for pain.    b complex vitamins capsule Take 1 capsule by mouth daily.    benzonatate (TESSALON) 100 MG capsule Take 200 mg by mouth 3 (three) times daily as needed for cough.    brimonidine (ALPHAGAN) 0.2 % ophthalmic solution Place 1 drop into both eyes 2 (two) times daily.    clopidogrel (PLAVIX) 75 MG tablet Take 1 tablet (75 mg total) by mouth daily.    estradiol (ESTRING) 7.5 MCG/24HR vaginal ring Place 1 each vaginally every 3 (three) months.    famciclovir (FAMVIR) 500 MG tablet Take 500 mg by mouth daily.      famotidine (PEPCID) 20 MG tablet TAKE ONE TABLET BY MOUTH TWICE A DAY    FARXIGA 5 MG TABS tablet TAKE 1 TABLET BY MOUTH DAILY BEFORE BREAKFAST    furosemide (LASIX) 80 MG tablet TAKE TWO TABLETS BY MOUTH DAILY    GEMTESA 75 MG TABS TAKE ONE TABLET BY MOUTH DAILY    glucose blood (CONTOUR NEXT TEST) test strip Use as instructed    golimumab (SIMPONI ARIA) 50 MG/4ML SOLN injection Inject into the vein. Every 8 weeks    KLOR-CON M20 20 MEQ tablet TAKE ONE TABLET BY MOUTH DAILY    Lancets MISC Check blood sugar once daily. Contour Next Device.    loratadine (CLARITIN) 10 MG tablet Take 10 mg by mouth daily.    losartan (COZAAR) 100 MG tablet Take 0.5 tablets (50 mg total) by mouth daily.    lovastatin (MEVACOR) 40 MG tablet Take 1 tablet (40 mg total) by mouth at bedtime.    Multiple Vitamins-Minerals (CENTRUM SILVER 50+WOMEN PO) Take 1 tablet by mouth daily.    mycophenolate (CELLCEPT) 500 MG tablet Take 500 mg by mouth 2 (two) times daily. 06/23/2022: Patient takes 500 mg daily   NON FORMULARY Take 900 mg by mouth in the morning.    pramipexole (MIRAPEX) 0.25 MG tablet TAKE 1 TABLET BY MOUTH AT BEDTIME    prednisoLONE acetate (PRED FORTE) 1 % ophthalmic suspension Place 1 drop into both eyes in the  morning and at bedtime.    sertraline (ZOLOFT) 25 MG tablet Take 1 tablet (25 mg total) by mouth daily.    timolol (BETIMOL) 0.5 % ophthalmic solution Place 1 drop into the right eye 2 (two) times daily.    No facility-administered encounter medications on file as of 05/20/2023.    Recent vitals BP Readings from Last 3 Encounters:  04/08/23 136/82  03/26/23 (!) 173/96  03/25/23 117/74   Pulse Readings from Last 3 Encounters:  04/08/23 70  03/26/23 75  03/25/23 72   Wt Readings from Last 3 Encounters:  04/08/23 153 lb 9.6 oz (69.7 kg)  03/26/23 152 lb (68.9 kg)  03/18/23 152 lb (68.9 kg)   BMI Readings from Last 3 Encounters:  04/08/23 28.09 kg/m  03/26/23 27.80 kg/m  03/18/23  27.80 kg/m    Recent lab results    Component Value Date/Time   NA 145 02/22/2023 0905   NA 144 11/29/2019 0000   K 4.1 02/22/2023 0905   CL 105 02/22/2023 0905   CO2 32 02/22/2023 0905   GLUCOSE 119 (H) 02/22/2023 0905   BUN 26 (H) 02/22/2023 0905   BUN 21 11/29/2019 0000   CREATININE 1.07 02/22/2023 0905   CREATININE 1.07 (H) 10/03/2020 0854   CALCIUM 10.0 02/22/2023 0905    Lab Results  Component Value Date   CREATININE 1.07 02/22/2023   GFR 47.59 (L) 02/22/2023   GFRNONAA 42 (L) 06/12/2022   GFRAA 56 (L) 10/03/2020   Lab Results  Component Value Date/Time   HGBA1C 6.8 (H) 02/22/2023 09:05 AM   HGBA1C 6.6 (A) 06/16/2022 08:33 AM   HGBA1C 7.3 (H) 12/08/2021 09:44 AM   HGBA1C 7.0 11/29/2019 12:00 AM   MICROALBUR 1.2 02/22/2023 09:05 AM   MICROALBUR <0.7 07/03/2020 09:08 AM    Lab Results  Component Value Date   CHOL 215 (H) 02/22/2023   HDL 45.50 02/22/2023   LDLCALC 131 (H) 02/22/2023   LDLDIRECT 136.0 01/25/2019   TRIG 192.0 (H) 02/22/2023   CHOLHDL 5 02/22/2023    Care Gaps: Annual wellness visit in last year? Yes  If Diabetic: Last eye exam / retinopathy screening: 11/24/2022 Last diabetic foot exam: 02/22/2023 Last UACR: 02/22/2023  Star Rating Drugs:  Farxiga 5 mg last filled 04/29/2023 90 DS Losartan 100 mg last filled 05/03/2023 90 DS Lovastatin 40 mg last filled 02/23/2023 90 DS   Future Appointments  Date Time Provider Department Center  06/23/2023  8:00 AM Allwardt, Crist Infante, PA-C [facility]  06/28/2023  9:20 AM Selmer Dominion, NP [facility]  10/18/2023  3:00 PM Erroll Luna, RPH [facility] None  10/27/2023 10:00 AM [facility] DX DEXA 1 [facility]  03/21/2024  2:30 PM [facility] ANNUAL WELLNESS VISIT 1 [facility]   April D Calhoun, [facility] Clinical Pharmacist Assistant [phone]

## 2023-06-01 ENCOUNTER — Other Ambulatory Visit: Payer: Self-pay | Admitting: Physician Assistant

## 2023-06-23 ENCOUNTER — Encounter: Payer: Self-pay | Admitting: Physician Assistant

## 2023-06-23 ENCOUNTER — Ambulatory Visit (INDEPENDENT_AMBULATORY_CARE_PROVIDER_SITE_OTHER): Payer: Medicare HMO | Admitting: Physician Assistant

## 2023-06-23 VITALS — BP 131/75 | HR 62 | Temp 98.0°F | Ht 62.0 in | Wt 152.6 lb

## 2023-06-23 DIAGNOSIS — Z7984 Long term (current) use of oral hypoglycemic drugs: Secondary | ICD-10-CM

## 2023-06-23 DIAGNOSIS — E1165 Type 2 diabetes mellitus with hyperglycemia: Secondary | ICD-10-CM | POA: Diagnosis not present

## 2023-06-23 DIAGNOSIS — G4710 Hypersomnia, unspecified: Secondary | ICD-10-CM

## 2023-06-23 DIAGNOSIS — N1831 Chronic kidney disease, stage 3a: Secondary | ICD-10-CM

## 2023-06-23 DIAGNOSIS — I10 Essential (primary) hypertension: Secondary | ICD-10-CM | POA: Diagnosis not present

## 2023-06-23 LAB — COMPREHENSIVE METABOLIC PANEL
ALT: 13 U/L (ref 0–35)
AST: 16 U/L (ref 0–37)
Albumin: 4.2 g/dL (ref 3.5–5.2)
Alkaline Phosphatase: 62 U/L (ref 39–117)
BUN: 29 mg/dL — ABNORMAL HIGH (ref 6–23)
CO2: 29 mEq/L (ref 19–32)
Calcium: 9.7 mg/dL (ref 8.4–10.5)
Chloride: 106 mEq/L (ref 96–112)
Creatinine, Ser: 1.08 mg/dL (ref 0.40–1.20)
GFR: 46.96 mL/min — ABNORMAL LOW (ref >60.00)
Glucose, Bld: 122 mg/dL — ABNORMAL HIGH (ref 70–99)
Potassium: 3.7 mEq/L (ref 3.5–5.1)
Sodium: 143 mEq/L (ref 135–145)
Total Bilirubin: 0.9 mg/dL (ref 0.2–1.2)
Total Protein: 6.5 g/dL (ref 6.0–8.3)

## 2023-06-23 LAB — CBC WITH DIFFERENTIAL/PLATELET
Basophils Absolute: 0 10*3/uL (ref 0.0–0.1)
Basophils Relative: 0.5 % (ref 0.0–3.0)
Eosinophils Absolute: 0.1 10*3/uL (ref 0.0–0.7)
Eosinophils Relative: 2.3 % (ref 0.0–5.0)
HCT: 41.3 % (ref 36.0–46.0)
Hemoglobin: 13.4 g/dL (ref 12.0–15.0)
Lymphocytes Relative: 38.3 % (ref 12.0–46.0)
Lymphs Abs: 2.5 10*3/uL (ref 0.7–4.0)
MCHC: 32.4 g/dL (ref 30.0–36.0)
MCV: 95 fl (ref 78.0–100.0)
Monocytes Absolute: 0.6 10*3/uL (ref 0.1–1.0)
Monocytes Relative: 9.6 % (ref 3.0–12.0)
Neutro Abs: 3.2 10*3/uL (ref 1.4–7.7)
Neutrophils Relative %: 49.3 % (ref 43.0–77.0)
Platelets: 168 10*3/uL (ref 150.0–400.0)
RBC: 4.35 Mil/uL (ref 3.87–5.11)
RDW: 13.2 % (ref 11.5–15.5)
WBC: 6.5 10*3/uL (ref 4.0–10.5)

## 2023-06-23 LAB — VITAMIN B12: Vitamin B-12: 676 pg/mL (ref 211–911)

## 2023-06-23 LAB — VITAMIN D 25 HYDROXY (VIT D DEFICIENCY, FRACTURES): VITD: 27.41 ng/mL — ABNORMAL LOW (ref 30.00–100.00)

## 2023-06-23 LAB — HEMOGLOBIN A1C: Hgb A1c MFr Bld: 6.8 % — ABNORMAL HIGH (ref 4.6–6.5)

## 2023-06-23 LAB — TSH: TSH: 1.24 u[IU]/mL (ref 0.35–5.50)

## 2023-06-23 NOTE — Patient Instructions (Addendum)
Very good to see you today! Let's check labs, treat any abnormalities there.  Try your best to limit naps to 30 minutes max.  Work on increase walking / exercise / yoga, move your body!   Let me know sooner if any changes.

## 2023-06-23 NOTE — Progress Notes (Unsigned)
Subjective:    Patient ID: Cheryl Wood, female    DOB: 16-Sep-1936, 87 y.o.   MRN: [id number]  Chief Complaint  Patient presents with   Follow-up    4 month follow up  falling a lot though day ,  sleeping at night with out any issues, when she naps she sleeps for 2-3 hours. Having irregular b/p reading , she has reads with her , she has new cuff.     HPI Patient is in today for 4 month follow up. Here with daughter today as well.   Takes a daily women's vitamin (Centrum).  Says she still doesn't have any energy.  Says the least little thing and she's out of breath. Bedtime 10 pm, usually up once to use the bathroom, then wakes up around 5 am, out of bed by 6 am.  Napping, 1-2 times per day; usually 2 hours max.  Gradually worsening over the last few months. Her sister has been worried, when she goes to visit, she could sleep the whole afternoon.   Says she is tired just sitting here and could go back to sleep.  Feeling a little more unsteady on her feet as well.   Chronically SOB, but this is normal for her, and has been worked up. No chest pain. No headaches. No memory concerns. No falls  Glucose 128 on average at home.   No snoring or gasping for air while sleeping.   Simponi injection every 8 weeks from her rheumatologist.  Past Medical History:  Diagnosis Date   Allergy    Arthritis    Bleeding of blood vessel    ext. genitalia area   Blood in stool    Chronic kidney disease    Depression    Glaucoma    Heart murmur    History of chicken pox    History of recurrent UTIs    History of stomach ulcers    Hyperlipidemia    Hypertension    Memory loss    Rheumatic fever    Seizures (HCC)    Stroke (HCC)    2007; embolic   TIA (transient ischemic attack) 10/22/2013   Urine incontinence     Past Surgical History:  Procedure Laterality Date   ABDOMINAL HYSTERECTOMY     APPENDECTOMY  1983   CATARACT EXTRACTION Right May 13, 2014   EYE SURGERY  12/13/2018    stent inserted in left eye   gum transplant     RIGHT/LEFT HEART CATH AND CORONARY ANGIOGRAPHY N/A 05/12/2018   Procedure: RIGHT/LEFT HEART CATH AND CORONARY ANGIOGRAPHY;  Surgeon: Lyn Records, MD;  Location: [facility] INVASIVE CV LAB;  Service: Cardiovascular;  Laterality: N/A;   THORACIC AORTOGRAM N/A 05/12/2018   Procedure: THORACIC AORTOGRAM;  Surgeon: Lyn Records, MD;  [facility] INV[facility] CV LAB;  Service: Cardiovascular;  Laterality: N/A;   TONSILLECTOMY      Family History  Problem Relation Age of Onset   Cancer Mother    Early death Mother    Heart disease Father    Arthritis Father    Early death Father    Hearing loss Father    Hypertension Father    Hyperlipidemia Father    Cancer Sister    Arthritis Sister    Arthritis Brother    Diabetes Brother    Heart attack Brother    Heart disease Brother    Hyperlipidemia Brother    Hypertension Brother    Diabetes Daughter    Heart  disease Daughter    Hyperlipidemia Daughter    Hyperlipidemia Maternal Grandmother    Hypertension Maternal Grandmother    Stroke Maternal Grandmother    Alcohol abuse Maternal Grandfather    Early death Maternal Grandfather        tuberculosis   Arthritis Paternal Grandmother    Alcohol abuse Paternal Grandfather    Heart disease Paternal Grandfather    Hyperlipidemia Paternal Grandfather    Hypertension Paternal Grandfather    Stroke Paternal Grandfather    Alcohol abuse Sister    COPD Sister    Drug abuse Sister    Early death Sister    Heart disease Sister    Hypertension Sister    Hyperlipidemia Sister    Early death Brother    Early death Brother     Social History   Tobacco Use   Smoking status: Former    Years: 40    Types: Cigarettes    Quit date: 11/05/1971    Years since quitting: 51.6   Smokeless tobacco: Never  Vaping Use   Vaping Use: Never used  Substance Use Topics   Alcohol use: No    Alcohol/week: 0.0 standard drinks of alcohol   Drug use: No      Allergies  Allergen Reactions   Dorzolamide Hcl-Timolol Mal Other (See Comments)    Red eyes   Tetanus Toxoids Swelling    Arm was twice the size it should be   Hctz [Hydrochlorothiazide] Rash    Review of Systems NEGATIVE UNLESS OTHERWISE INDICATED IN HPI      Objective:     BP 131/75   Pulse 62   Temp 98 F (36.7 C)   Ht 5\' 2"  (1.575 m)   Wt 152 lb 9.6 oz (69.2 kg)   LMP  (LMP Unknown)   SpO2 95%   BMI 27.91 kg/m   Wt Readings from Last 3 Encounters:  06/23/23 152 lb 9.6 oz (69.2 kg)  04/08/23 153 lb 9.6 oz (69.7 kg)  03/26/23 152 lb (68.9 kg)    BP Readings from Last 3 Encounters:  06/23/23 131/75  04/08/23 136/82  03/26/23 (!) 173/96     Physical Exam Vitals and nursing note reviewed.  Constitutional:      Appearance: Normal appearance.  HENT:     Head: Normocephalic.  Cardiovascular:     Rate and Rhythm: Normal rate and regular rhythm.     Pulses: Normal pulses.     Heart sounds: No murmur heard. Pulmonary:     Effort: Pulmonary effort is normal.     Breath sounds: Normal breath sounds. No wheezing or rales.  Musculoskeletal:     Right lower leg: No edema.     Left lower leg: No edema.  Neurological:     General: No focal deficit present.     Mental Status: She is alert and oriented to person, place, and time.     Cranial Nerves: No cranial nerve deficit.     Motor: No weakness.     Coordination: Coordination normal.     Gait: Gait normal.  Psychiatric:        Mood and Affect: Mood normal.        Behavior: Behavior normal.        Thought Content: Thought content normal.        Judgment: Judgment normal.        Assessment & Plan:  Sleeping excessive -     Comprehensive metabolic panel -  CBC with Differential/Platelet -     Hemoglobin A1c -     TSH -     Vitamin B12 -     VITAMIN D 25 Hydroxy (Vit-D Deficiency, Fractures)  Essential hypertension Assessment & Plan: Blood pressure is normal per our reading today.  She is on  losartan 50 mg daily.  Continue this dose.   Orders: -     Comprehensive metabolic panel  Type 2 diabetes mellitus with hyperglycemia, without long-term current use of insulin (HCC) Assessment & Plan: Lab Results  Component Value Date   HGBA1C 6.8 (H) 06/23/2023   HGBA1C 6.8 (H) 02/22/2023   HGBA1C 6.6 (A) 06/16/2022    Recheck labs today Farxiga 5 mg daily  Doing well with lifestyle   Orders: -     Comprehensive metabolic panel -     Hemoglobin A1c  Chronic kidney disease, stage 3a (HCC) -     Comprehensive metabolic panel   No red flags on exam. Pt appears well, normal, at her baseline.   Let's check labs, treat any abnormalities there.  Try your best to limit naps to 30 minutes max.  Work on increase walking / exercise / yoga, move your body!   She will also ask rheumatologist if simponi could be contributing to tiredness.    Return in about 4 months (around 10/23/2023) for recheck/follow-up.     Zoriyah Scheidegger M Keiasha Diep, PA-C

## 2023-06-24 ENCOUNTER — Other Ambulatory Visit: Payer: Self-pay

## 2023-06-24 DIAGNOSIS — Z79899 Other long term (current) drug therapy: Secondary | ICD-10-CM | POA: Diagnosis not present

## 2023-06-24 DIAGNOSIS — M0600 Rheumatoid arthritis without rheumatoid factor, unspecified site: Secondary | ICD-10-CM | POA: Diagnosis not present

## 2023-06-24 MED ORDER — VITAMIN D (ERGOCALCIFEROL) 1.25 MG (50000 UNIT) PO CAPS: 50000.0000 [IU] | ORAL_CAPSULE | ORAL | 2 refills | Status: DC

## 2023-06-24 NOTE — Assessment & Plan Note (Signed)
Blood pressure is normal per our reading today.  She is on losartan 50 mg daily.  Continue this dose.

## 2023-06-24 NOTE — Assessment & Plan Note (Signed)
Lab Results  Component Value Date   HGBA1C 6.8 (H) 06/23/2023   HGBA1C 6.8 (H) 02/22/2023   HGBA1C 6.6 (A) 06/16/2022    Recheck labs today Farxiga 5 mg daily  Doing well with lifestyle

## 2023-06-25 ENCOUNTER — Other Ambulatory Visit: Payer: Self-pay | Admitting: Physician Assistant

## 2023-06-27 NOTE — Progress Notes (Signed)
[facility]   Subjective:     Chief Complaint:  Chief Complaint  Patient presents with   Pessary Check    Cheryl Wood is a 87 y.o. female here for a pessary check and estring replacement   History of Present Illness: Cheryl Wood is a 87 y.o. female with stress incontinence who presents for a pessary check. She is using a size #0 incontinence ring pessary. The pessary has been working well and she has no complaints. She has an e-string in place. She endorses mild vaginal bleeding.  Past Medical History: Patient  has a past medical history of Allergy, Arthritis, Bleeding of blood vessel, Blood in stool, Chronic kidney disease, Depression, Glaucoma, Heart murmur, History of chicken pox, History of recurrent UTIs, History of stomach ulcers, Hyperlipidemia, Hypertension, Memory loss, Rheumatic fever, Seizures (HCC), Stroke (HCC), TIA (transient ischemic attack) (10/22/2013), and Urine incontinence.   Past Surgical History: She  has a past surgical history that includes Abdominal hysterectomy; gum transplant; Cataract extraction (Right, May 13, 2014); RIGHT/LEFT HEART CATH AND CORONARY ANGIOGRAPHY (N/A, 05/12/2018); THORACIC AORTOGRAM (N/A, 05/12/2018); Eye surgery (12/13/2018); Appendectomy (1983); and Tonsillectomy.   Medications: She has a current medication list which includes the following prescription(s): acetaminophen, b complex vitamins, brimonidine, clopidogrel, estradiol, famciclovir, famotidine, farxiga, furosemide, gemtesa, contour next test, simponi aria, klor-con m20, lancets, loratadine, losartan, lovastatin, multiple vitamins-minerals, NON FORMULARY, pramipexole, prednisolone acetate, sertraline, timolol, and vitamin d (ergocalciferol).   Allergies: Patient is allergic to dorzolamide hcl-timolol mal, tetanus toxoids, and hctz [hydrochlorothiazide].   Social History: Patient  reports that she quit smoking about 51 years ago. Her smoking use included cigarettes.  She has never used smokeless tobacco. She reports that she does not drink alcohol and does not use drugs.      Objective:    Physical Exam: BP 129/74   Pulse 64   LMP  (LMP Unknown)  Gen: No apparent distress, A&O x 3. Detailed Urogynecologic Evaluation:  Pelvic Exam: Normal external female genitalia; Bartholin's and Skene's glands normal in appearance; urethral meatus normal in appearance, no urethral masses or discharge. The pessary was noted to be in place. It was removed and cleaned. Speculum exam revealed mild excoriation in the vagina and clumps of white discharge. The pessary was replaced. It was comfortable to the patient and fit well.     Assessment/Plan:    Assessment: Cheryl Wood is a 87 y.o. with stress incontinence here for a pessary check. She is doing well.  Plan: She will keep the pessary in place until next visit. She will continue to use E-string.   Patient has possible yeast, will treat with Diflucan. We discussed that this may be contributing to her bleeding as it causes increased vaginal irritation and makes the tissue easier to bleed. If she is not having improvement can consider going back to estrogen cream rather than E-string for cost purposes.   She will follow-up in 3 months for a pessary check or sooner as needed.   All questions were answered.

## 2023-06-28 ENCOUNTER — Encounter: Payer: Self-pay | Admitting: Obstetrics and Gynecology

## 2023-06-28 ENCOUNTER — Ambulatory Visit (INDEPENDENT_AMBULATORY_CARE_PROVIDER_SITE_OTHER): Payer: Medicare HMO | Admitting: Obstetrics and Gynecology

## 2023-06-28 ENCOUNTER — Other Ambulatory Visit (HOSPITAL_COMMUNITY)
Admission: RE | Admit: 2023-06-28 | Discharge: 2023-06-28 | Disposition: A | Discharge status: Home / Self Care | Payer: Medicare HMO | Source: Ambulatory Visit | Attending: Obstetrics and Gynecology | Admitting: Obstetrics and Gynecology

## 2023-06-28 VITALS — BP 129/74 | HR 64

## 2023-06-28 DIAGNOSIS — B3731 Acute candidiasis of vulva and vagina: Secondary | ICD-10-CM | POA: Diagnosis not present

## 2023-06-28 MED ORDER — FLUCONAZOLE 150 MG PO TABS: 150.0000 mg | ORAL_TABLET | Freq: Once | ORAL | 0 refills | Status: AC

## 2023-06-28 NOTE — Patient Instructions (Signed)
Take the 1 dose of diflucan for yeast  If you are still having bleeding after a this and feel the E-string is not helpful we can go back to the cream

## 2023-06-29 DIAGNOSIS — Z08 Encounter for follow-up examination after completed treatment for malignant neoplasm: Secondary | ICD-10-CM | POA: Diagnosis not present

## 2023-06-29 DIAGNOSIS — D1801 Hemangioma of skin and subcutaneous tissue: Secondary | ICD-10-CM | POA: Diagnosis not present

## 2023-06-29 DIAGNOSIS — L578 Other skin changes due to chronic exposure to nonionizing radiation: Secondary | ICD-10-CM | POA: Diagnosis not present

## 2023-06-29 DIAGNOSIS — L821 Other seborrheic keratosis: Secondary | ICD-10-CM | POA: Diagnosis not present

## 2023-06-29 DIAGNOSIS — Z859 Personal history of malignant neoplasm, unspecified: Secondary | ICD-10-CM | POA: Diagnosis not present

## 2023-06-29 LAB — CERVICOVAGINAL ANCILLARY ONLY
Bacterial Vaginitis (gardnerella): NEGATIVE
Candida Glabrata: NEGATIVE
Candida Vaginitis: NEGATIVE
Comment: NEGATIVE
Comment: NEGATIVE
Comment: NEGATIVE

## 2023-07-12 ENCOUNTER — Ambulatory Visit: Payer: Medicare HMO | Admitting: Physician Assistant

## 2023-07-29 ENCOUNTER — Ambulatory Visit: Payer: Medicare HMO | Attending: Physician Assistant | Admitting: Physician Assistant

## 2023-07-29 ENCOUNTER — Encounter: Payer: Self-pay | Admitting: Physician Assistant

## 2023-07-29 VITALS — BP 102/68 | HR 74 | Ht 61.0 in | Wt 154.4 lb

## 2023-07-29 DIAGNOSIS — R5383 Other fatigue: Secondary | ICD-10-CM | POA: Diagnosis not present

## 2023-07-29 DIAGNOSIS — Z6828 Body mass index (BMI) 28.0-28.9, adult: Secondary | ICD-10-CM | POA: Diagnosis not present

## 2023-07-29 DIAGNOSIS — M1991 Primary osteoarthritis, unspecified site: Secondary | ICD-10-CM | POA: Diagnosis not present

## 2023-07-29 DIAGNOSIS — Z8673 Personal history of transient ischemic attack (TIA), and cerebral infarction without residual deficits: Secondary | ICD-10-CM | POA: Diagnosis not present

## 2023-07-29 DIAGNOSIS — M79605 Pain in left leg: Secondary | ICD-10-CM | POA: Diagnosis not present

## 2023-07-29 DIAGNOSIS — M0600 Rheumatoid arthritis without rheumatoid factor, unspecified site: Secondary | ICD-10-CM | POA: Diagnosis not present

## 2023-07-29 DIAGNOSIS — E663 Overweight: Secondary | ICD-10-CM | POA: Diagnosis not present

## 2023-07-29 DIAGNOSIS — I10 Essential (primary) hypertension: Secondary | ICD-10-CM

## 2023-07-29 DIAGNOSIS — M5432 Sciatica, left side: Secondary | ICD-10-CM | POA: Diagnosis not present

## 2023-07-29 DIAGNOSIS — Z79899 Other long term (current) drug therapy: Secondary | ICD-10-CM | POA: Diagnosis not present

## 2023-07-29 DIAGNOSIS — H209 Unspecified iridocyclitis: Secondary | ICD-10-CM | POA: Diagnosis not present

## 2023-07-29 NOTE — Patient Instructions (Addendum)
Medication Instructions:  No changes in medication therapy. If you need a refill on your cardiac medications before your next appointment, please call your pharmacy.  Lab Work: none If you have labs (blood work) drawn today and your tests are completely normal, you will receive your results only by: MyChart Message (if you have MyChart) OR A paper copy in the mail If you have any lab test that is abnormal or we need to change your treatment, we will call you to review the results.  Testing/Procedures: Your physician has requested that you have an ankle brachial index (ABI). During this test an ultrasound and blood pressure cuff are used to evaluate the arteries that supply the arms and legs with blood. Allow thirty minutes for this exam. There are no restrictions or special instructions.    Follow-Up: At [facility], you and your health needs are our priority.  As part of our continuing mission to provide you with exceptional heart care, we have created designated Provider Care Teams.  These Care Teams include your primary Cardiologist (physician) and Advanced Practice Providers (APPs -  Physician Assistants and Nurse Practitioners) who all work together to provide you with the care you need, when you need it.  We recommend signing up for the patient portal called "MyChart".  Sign up information is provided on this After Visit Summary.  MyChart is used to connect with patients for Virtual Visits (Telemedicine).  Patients are able to view lab/test results, encounter notes, upcoming appointments, etc.  Non-urgent messages can be sent to your provider as well.   To learn more about what you can do with MyChart, go to [email or web address].    Your next appointment:   6 month(s)  Provider:   Nanetta Batty, MD

## 2023-07-29 NOTE — Progress Notes (Signed)
Cardiology Office Note:  .   Date:  07/29/2023  ID:  Cheryl Wood, DOB Jun 18, 1936, MRN [id number] PCP: Allwardt, Crist Infante, PA-C  [facility] Providers Cardiologist:  Nanetta Batty, MD     History of Present Illness: .   Cheryl Wood is a 87 y.o. female with a hx of HTN, HLD, DM2, history of rheumatic fever, history of seizure, and history of embolic CVA 2007.  Patient was previously followed by Dr. Viann Fish who last saw her in May 2019.  Patient has never had a heart attack.  Patient was initially referred to [place] for evaluation of dyspnea on exertion.  She denied any chest pain.  Echocardiogram performed on 03/28/2021 showed normal EF, grade 1 DD, mild to moderate AI.  Left and right heart cath performed by Dr. Katrinka Blazing revealed minimal CAD with wedge pressure of 10 and LVEDP of 23.  Event monitor in June 2023 showed frequent PACs, rare PVCs and occasionally runs of SVT.  She was last seen by Dr. Allyson Sabal in January 2024 at which time she was doing well, still gets occasional palpitation and atypical chest discomfort.  Repeat echocardiogram obtained on 02/09/2023 showed EF 60 to 65%, mild asymmetric LVH of septal segment, grade 1 DD, mild MR and mild AI.  Patient presents today for follow-up.  Overall she has been doing well without chest pain or shortness of breath.  She is on a very high dose of Lasix 80 mg twice a day with potassium supplement.  She says she has tried to cut back Lasix in the past however her leg became really swollen.  She is also drinking two 60 ounces bottles of fluid per day.  I initially recommended a trial to cut back Lasix to 80 mg daily while cutting the fluid intake to half, however she says she has tried that in the past and it did not work for her.  Recent blood work shows stable renal function and electrolyte.  She has chronic left lower extremity pain which she attributed to sciatic nerve issue.  It is worse with standing and walking.  On exam, she has weak left  lower extremity dorsalis pedis and posterior tibial pulse, normal right lower extremity pulse.  I recommended ABI as a screening test.  She can follow-up with Dr. Gery Pray in 6 months.    ROS:   She denies chest pain, palpitations, dyspnea, pnd, orthopnea, n, v, dizziness, syncope, edema, weight gain, or early satiety. All other systems reviewed and are otherwise negative except as noted above.    Studies Reviewed: .        Cardiac Studies & Procedures   CARDIAC CATHETERIZATION  CARDIAC CATHETERIZATION 05/12/2018  Narrative  Right dominant coronary anatomy.  RCA contains luminal irregularities.  Left main is normal.  Mid LAD contains irregularities up to 50% following the first diagonal.  The proximal LAD contains mild ectasia.  Circumflex contains proximal irregularities and ectasia.  No obstructive lesions are noted.  Dilated ascending aorta.  Trace to mild aortic regurgitation (1+).  Normal LV size, and systolic function with estimated ejection fraction 65%.  LVEDP 23 mmHg.  This finding is consistent with chronic diastolic dysfunction/heart failure.  Trivial to 1+ mitral regurgitation  Normal pulmonary artery pressures and mean pulmonary capillary wedge of 10 mmHg.  RECOMMENDATIONS:   Per Dr. Donnie Aho.  Findings Coronary Findings Diagnostic  Dominance: Right  Left Anterior Descending Prox LAD to Mid LAD lesion is 50% stenosed.  Left Circumflex  First  Obtuse Marginal Branch Vessel is small in size.  Second Obtuse Marginal Branch  Right Coronary Artery Prox RCA to Dist RCA lesion is 20% stenosed.  Intervention  No interventions have been documented.     ECHOCARDIOGRAM  ECHOCARDIOGRAM COMPLETE 02/09/2023  Narrative ECHOCARDIOGRAM REPORT    Patient Name:   Cheryl Wood Date of Exam: 02/09/2023 Medical Rec #:  [id number]      Height:       62.0 in Accession #:    [id number]     Weight:       160.0 lb Date of Birth:  Dec 28, 1936       BSA:           1.739 m Patient Age:    86 years       BP:           126/67 mmHg Patient Gender: F              HR:           66 bpm. Exam Location:  [address]  Procedure: 2D Echo, Cardiac Doppler and Color Doppler  Indications:    I10 Hypertenison Z86.73 History of CVA  History:        Patient has prior history of Echocardiogram examinations, most recent 03/28/2021. Stroke and TIA, Signs/Symptoms:Murmur; Risk Factors:Hypertension and Dyslipidemia. Chronic Kidney Disease.  Sonographer:    Sedonia Small Rodgers-Jones RDCS Referring Phys: [id number] JONATHAN J BERRY  IMPRESSIONS   1. Left ventricular ejection fraction, by estimation, is 60 to 65%. The left ventricle has normal function. The left ventricle has no regional wall motion abnormalities. There is mild asymmetric left ventricular hypertrophy of the septal segment. Left ventricular diastolic parameters are consistent with Grade I diastolic dysfunction (impaired relaxation). Elevated left ventricular end-diastolic pressure. 2. Right ventricular systolic function is normal. The right ventricular size is normal. There is normal pulmonary artery systolic pressure. 3. The mitral valve is normal in structure. Mild mitral valve regurgitation. No evidence of mitral stenosis. 4. The aortic valve is tricuspid. There is mild calcification of the aortic valve. There is mild thickening of the aortic valve. Aortic valve regurgitation is mild. No aortic stenosis is present. 5. Aortic dilatation noted. There is mild dilatation of the ascending aorta, measuring 39 mm. 6. The inferior vena cava is normal in size with greater than 50% respiratory variability, suggesting right atrial pressure of 3 mmHg.  FINDINGS Left Ventricle: Left ventricular ejection fraction, by estimation, is 60 to 65%. The left ventricle has normal function. The left ventricle has no regional wall motion abnormalities. The left ventricular internal cavity size was normal in size. There is mild  asymmetric left ventricular hypertrophy of the septal segment. Left ventricular diastolic parameters are consistent with Grade I diastolic dysfunction (impaired relaxation). Elevated left ventricular end-diastolic pressure.  Right Ventricle: The right ventricular size is normal. No increase in right ventricular wall thickness. Right ventricular systolic function is normal. There is normal pulmonary artery systolic pressure. The tricuspid regurgitant velocity is 2.30 m/s, and with an assumed right atrial pressure of 3 mmHg, the estimated right ventricular systolic pressure is 24.2 mmHg.  Left Atrium: Left atrial size was normal in size.  Right Atrium: Right atrial size was normal in size.  Pericardium: There is no evidence of pericardial effusion.  Mitral Valve: The mitral valve is normal in structure. Mild mitral valve regurgitation. No evidence of mitral valve stenosis. MV peak gradient, 6.4 mmHg. The mean mitral valve gradient is 2.0 mmHg.  Tricuspid  Valve: The tricuspid valve is normal in structure. Tricuspid valve regurgitation is mild . No evidence of tricuspid stenosis.  Aortic Valve: The aortic valve is tricuspid. There is mild calcification of the aortic valve. There is mild thickening of the aortic valve. Aortic valve regurgitation is mild. Aortic regurgitation PHT measures 503 msec. No aortic stenosis is present. Aortic valve mean gradient measures 6.5 mmHg. Aortic valve peak gradient measures 12.2 mmHg. Aortic valve area, by VTI measures 2.81 cm.  Pulmonic Valve: The pulmonic valve was normal in structure. Pulmonic valve regurgitation is trivial. No evidence of pulmonic stenosis.  Aorta: Aortic dilatation noted. There is mild dilatation of the ascending aorta, measuring 39 mm.  Venous: The inferior vena cava is normal in size with greater than 50% respiratory variability, suggesting right atrial pressure of 3 mmHg.  IAS/Shunts: No atrial level shunt detected by color flow  Doppler.   LEFT VENTRICLE PLAX 2D LVIDd:         3.80 cm   Diastology LVIDs:         2.70 cm   LV e' medial:    4.24 cm/s LV PW:         0.90 cm   LV E/e' medial:  15.2 LV IVS:        1.20 cm   LV e' lateral:   7.40 cm/s LVOT diam:     2.10 cm   LV E/e' lateral: 8.7 LV SV:         106 LV SV Index:   61 LVOT Area:     3.46 cm   RIGHT VENTRICLE            IVC RV Basal diam:  2.90 cm    IVC diam: 1.00 cm RV S prime:     9.25 cm/s TAPSE (M-mode): 1.6 cm  LEFT ATRIUM             Index        RIGHT ATRIUM           Index LA diam:        4.60 cm 2.65 cm/m   RA Area:     12.80 cm LA Vol (A2C):   40.5 ml 23.29 ml/m  RA Volume:   29.30 ml  16.85 ml/m LA Vol (A4C):   36.3 ml 20.88 ml/m LA Biplane Vol: 39.1 ml 22.49 ml/m AORTIC VALVE AV Area (Vmax):    2.68 cm AV Area (Vmean):   2.58 cm AV Area (VTI):     2.81 cm AV Vmax:           174.75 cm/s AV Vmean:          118.500 cm/s AV VTI:            0.378 m AV Peak Grad:      12.2 mmHg AV Mean Grad:      6.5 mmHg LVOT Vmax:         135.00 cm/s LVOT Vmean:        88.400 cm/s LVOT VTI:          0.306 m LVOT/AV VTI ratio: 0.81 AI PHT:            503 msec  AORTA Ao Root diam: 3.50 cm Ao Asc diam:  4.30 cm  MITRAL VALVE                TRICUSPID VALVE MV Area (PHT): 2.95 cm     TR Peak grad:   21.2 mmHg MV  Area VTI:   3.45 cm     TR Vmax:        230.00 cm/s MV Peak grad:  6.4 mmHg MV Mean grad:  2.0 mmHg     SHUNTS MV Vmax:       1.26 m/s     Systemic VTI:  0.31 m MV Vmean:      63.6 cm/s    Systemic Diam: 2.10 cm MV Decel Time: 257 msec MV E velocity: 64.50 cm/s MV A velocity: 122.00 cm/s MV E/A ratio:  0.53  Chilton Si MD Electronically signed by Chilton Si MD Signature Date/Time: 02/09/2023/11:45:19 AM    Final    MONITORS  LONG TERM MONITOR (3-14 DAYS) 07/15/2022  Narrative Patch Wear Time:  14 days and 0 hours (2023-06-30T12:46:58-0400 to 2023-07-14T12:47:01-399)  Patient had a min HR of 52  bpm, max HR of 197 bpm, and avg HR of 77 bpm. Predominant underlying rhythm was Sinus Rhythm. First Degree AV Block was present. 523 Supraventricular Tachycardia runs occurred, the run with the fastest interval lasting 4 beats with a max rate of 197 bpm, the longest lasting 28.7 secs with an avg rate of 148 bpm. Isolated SVEs were occasional (1.9%, 01027), SVE Couplets were occasional (1.0%, 8038), and SVE Triplets were rare (<1.0%, 1626). Isolated VEs were rare (<1.0%), VE Couplets were rare (<1.0%), and no VE Triplets were present. Ventricular Bigeminy was present.   SR/SB/ST Freq PACs, occasional PVCs Runs of SVT           Risk Assessment/Calculations:             Physical Exam:   VS:  BP 102/68 (BP Location: Left Arm, Patient Position: Sitting, Cuff Size: Normal)   Pulse 74   Ht 5\' 1"  (1.549 m)   Wt 154 lb 6.4 oz (70 kg)   LMP  (LMP Unknown)   SpO2 96%   BMI 29.17 kg/m    Wt Readings from Last 3 Encounters:  07/29/23 154 lb 6.4 oz (70 kg)  06/23/23 152 lb 9.6 oz (69.2 kg)  04/08/23 153 lb 9.6 oz (69.7 kg)    GEN: Well nourished, well developed in no acute distress NECK: No JVD; No carotid bruits CARDIAC: RRR, no murmurs, rubs, gallops RESPIRATORY:  Clear to auscultation without rales, wheezing or rhonchi  ABDOMEN: Soft, non-tender, non-distended EXTREMITIES:  No edema; No deformity   ASSESSMENT AND PLAN: .    Left leg pain: She believes heart leg discomfort is due to sciatic nerve issue.  She also has weak pulses in the left lower extremity.  Will obtain ABI to rule out vascular cause.  Hypertension: Blood pressure well-controlled  History of CVA: No recent recurrence.       Dispo: Follow-up with Dr. Allyson Sabal in 6 months.  Signed, Azalee Course, PA

## 2023-07-30 ENCOUNTER — Other Ambulatory Visit: Payer: Self-pay | Admitting: Physician Assistant

## 2023-08-12 ENCOUNTER — Ambulatory Visit (HOSPITAL_COMMUNITY)
Admission: RE | Admit: 2023-08-12 | Discharge: 2023-08-12 | Disposition: A | Discharge status: Home / Self Care | Payer: Medicare HMO | Source: Ambulatory Visit | Attending: Physician Assistant | Admitting: Physician Assistant

## 2023-08-12 DIAGNOSIS — M79605 Pain in left leg: Secondary | ICD-10-CM | POA: Insufficient documentation

## 2023-08-12 LAB — VAS US ABI WITH/WO TBI
Left ABI: 1.24
Right ABI: 1.3

## 2023-08-19 DIAGNOSIS — Z79899 Other long term (current) drug therapy: Secondary | ICD-10-CM | POA: Diagnosis not present

## 2023-08-19 DIAGNOSIS — M0600 Rheumatoid arthritis without rheumatoid factor, unspecified site: Secondary | ICD-10-CM | POA: Diagnosis not present

## 2023-08-23 ENCOUNTER — Other Ambulatory Visit: Payer: Self-pay | Admitting: Physician Assistant

## 2023-08-26 ENCOUNTER — Other Ambulatory Visit: Payer: Self-pay | Admitting: Obstetrics and Gynecology

## 2023-08-26 DIAGNOSIS — N3281 Overactive bladder: Secondary | ICD-10-CM

## 2023-08-27 ENCOUNTER — Other Ambulatory Visit: Payer: Self-pay | Admitting: Physician Assistant

## 2023-08-28 ENCOUNTER — Other Ambulatory Visit: Payer: Self-pay | Admitting: Physician Assistant

## 2023-08-28 ENCOUNTER — Other Ambulatory Visit: Payer: Self-pay | Admitting: Obstetrics and Gynecology

## 2023-08-28 DIAGNOSIS — B3731 Acute candidiasis of vulva and vagina: Secondary | ICD-10-CM

## 2023-09-22 DIAGNOSIS — R7989 Other specified abnormal findings of blood chemistry: Secondary | ICD-10-CM | POA: Diagnosis not present

## 2023-09-30 ENCOUNTER — Ambulatory Visit: Payer: Medicare HMO | Admitting: Obstetrics and Gynecology

## 2023-10-02 ENCOUNTER — Other Ambulatory Visit: Payer: Self-pay | Admitting: Physician Assistant

## 2023-10-18 ENCOUNTER — Encounter: Payer: Medicare HMO | Admitting: Pharmacist

## 2023-10-20 DIAGNOSIS — H4042X2 Glaucoma secondary to eye inflammation, left eye, moderate stage: Secondary | ICD-10-CM | POA: Diagnosis not present

## 2023-10-20 DIAGNOSIS — H4041X1 Glaucoma secondary to eye inflammation, right eye, mild stage: Secondary | ICD-10-CM | POA: Diagnosis not present

## 2023-10-21 DIAGNOSIS — M0600 Rheumatoid arthritis without rheumatoid factor, unspecified site: Secondary | ICD-10-CM | POA: Diagnosis not present

## 2023-10-21 DIAGNOSIS — Z79899 Other long term (current) drug therapy: Secondary | ICD-10-CM | POA: Diagnosis not present

## 2023-10-27 ENCOUNTER — Other Ambulatory Visit: Payer: Self-pay | Admitting: Physician Assistant

## 2023-10-27 ENCOUNTER — Ambulatory Visit
Admission: RE | Admit: 2023-10-27 | Discharge: 2023-10-27 | Disposition: A | Discharge status: Home / Self Care | Payer: Medicare HMO | Source: Ambulatory Visit | Attending: Physician Assistant

## 2023-10-27 DIAGNOSIS — E2839 Other primary ovarian failure: Secondary | ICD-10-CM | POA: Diagnosis not present

## 2023-10-27 DIAGNOSIS — M069 Rheumatoid arthritis, unspecified: Secondary | ICD-10-CM | POA: Diagnosis not present

## 2023-10-27 DIAGNOSIS — M8580 Other specified disorders of bone density and structure, unspecified site: Secondary | ICD-10-CM

## 2023-10-27 DIAGNOSIS — Z1231 Encounter for screening mammogram for malignant neoplasm of breast: Secondary | ICD-10-CM

## 2023-10-27 DIAGNOSIS — M8588 Other specified disorders of bone density and structure, other site: Secondary | ICD-10-CM | POA: Diagnosis not present

## 2023-10-27 DIAGNOSIS — N958 Other specified menopausal and perimenopausal disorders: Secondary | ICD-10-CM | POA: Diagnosis not present

## 2023-10-28 ENCOUNTER — Encounter: Payer: Self-pay | Admitting: Obstetrics and Gynecology

## 2023-10-28 ENCOUNTER — Ambulatory Visit: Payer: Medicare HMO | Admitting: Obstetrics and Gynecology

## 2023-10-28 VITALS — BP 106/69 | HR 87

## 2023-10-28 DIAGNOSIS — N393 Stress incontinence (female) (male): Secondary | ICD-10-CM

## 2023-10-28 DIAGNOSIS — N952 Postmenopausal atrophic vaginitis: Secondary | ICD-10-CM

## 2023-10-28 NOTE — Progress Notes (Signed)
[facility]   Subjective:     Chief Complaint:  Chief Complaint  Patient presents with   Pessary Check    Cheryl Wood is a 87 y.o. female is here for 3 month pessary check.   History of Present Illness: Cheryl Wood is a 87 y.o. female with stress incontinence who presents for a pessary check. She is using a size #0 incontinence ring pessary. The pessary has been working well and she has no complaints. She has an e-string in place. She denies vaginal bleeding.  Past Medical History: Patient  has a past medical history of Allergy, Arthritis, Bleeding of blood vessel, Blood in stool, Chronic kidney disease, Depression, Glaucoma, Heart murmur, History of chicken pox, History of recurrent UTIs, History of stomach ulcers, Hyperlipidemia, Hypertension, Memory loss, Rheumatic fever, Seizures (HCC), Stroke (HCC), TIA (transient ischemic attack) (10/22/2013), and Urine incontinence.   Past Surgical History: She  has a past surgical history that includes Abdominal hysterectomy; gum transplant; Cataract extraction (Right, May 13, 2014); RIGHT/LEFT HEART CATH AND CORONARY ANGIOGRAPHY (N/A, 05/12/2018); THORACIC AORTOGRAM (N/A, 05/12/2018); Eye surgery (12/13/2018); Appendectomy (1983); and Tonsillectomy.   Medications: She has a current medication list which includes the following prescription(s): acetaminophen, b complex vitamins, brimonidine, clopidogrel, estradiol, famciclovir, famotidine, farxiga, furosemide, gemtesa, contour next test, simponi aria, klor-con m20, lancets, loratadine, losartan, lovastatin, multiple vitamins-minerals, NON FORMULARY, pramipexole, prednisolone acetate, sertraline, timolol, and vitamin d (ergocalciferol).   Allergies: Patient is allergic to dorzolamide hcl-timolol mal, tetanus toxoids, and hctz [hydrochlorothiazide].   Social History: Patient  reports that she quit smoking about 52 years ago. Her smoking use included cigarettes. She has never used  smokeless tobacco. She reports that she does not drink alcohol and does not use drugs.      Objective:    Physical Exam: BP 106/69   Pulse 87   LMP  (LMP Unknown)  Gen: No apparent distress, A&O x 3. Detailed Urogynecologic Evaluation:  Pelvic Exam: Normal external female genitalia; Bartholin's and Skene's glands normal in appearance; urethral meatus normal in appearance, no urethral masses or discharge. The pessary was noted to be in place. It was removed and cleaned. Speculum exam revealed no lesions in the vagina and clumps of white discharge. The pessary was replaced. It was comfortable to the patient and fit well.     Assessment/Plan:    Assessment: Cheryl Wood is a 87 y.o. with stress incontinence here for a pessary check. She is doing well.  Plan: She will keep the pessary in place until next visit. She will continue to use E-string.   She will follow-up in 3 months for a pessary check or sooner as needed.   Of note, patient's blood pressure was low. I sent a note to PCP to inform her and her daughter is present with her and reports she will watch for signs of dizziness and falling.    All questions were answered.

## 2023-11-01 ENCOUNTER — Other Ambulatory Visit: Payer: Self-pay | Admitting: Physician Assistant

## 2023-11-04 ENCOUNTER — Encounter: Payer: Self-pay | Admitting: Physician Assistant

## 2023-11-04 ENCOUNTER — Ambulatory Visit: Payer: Medicare HMO | Admitting: Physician Assistant

## 2023-11-04 VITALS — BP 129/82 | HR 70 | Temp 97.3°F | Ht 61.0 in | Wt 155.0 lb

## 2023-11-04 DIAGNOSIS — F419 Anxiety disorder, unspecified: Secondary | ICD-10-CM

## 2023-11-04 DIAGNOSIS — I872 Venous insufficiency (chronic) (peripheral): Secondary | ICD-10-CM | POA: Diagnosis not present

## 2023-11-04 DIAGNOSIS — N1831 Chronic kidney disease, stage 3a: Secondary | ICD-10-CM

## 2023-11-04 DIAGNOSIS — E1165 Type 2 diabetes mellitus with hyperglycemia: Secondary | ICD-10-CM

## 2023-11-04 DIAGNOSIS — I10 Essential (primary) hypertension: Secondary | ICD-10-CM

## 2023-11-04 LAB — BASIC METABOLIC PANEL
BUN: 29 mg/dL — ABNORMAL HIGH (ref 6–23)
CO2: 29 meq/L (ref 19–32)
Calcium: 9.6 mg/dL (ref 8.4–10.5)
Chloride: 103 meq/L (ref 96–112)
Creatinine, Ser: 1.13 mg/dL (ref 0.40–1.20)
GFR: 44.36 mL/min — ABNORMAL LOW (ref >60.00)
Glucose, Bld: 117 mg/dL — ABNORMAL HIGH (ref 70–99)
Potassium: 3.4 meq/L — ABNORMAL LOW (ref 3.5–5.1)
Sodium: 141 meq/L (ref 135–145)

## 2023-11-04 LAB — HEMOGLOBIN A1C: Hgb A1c MFr Bld: 6.7 % — ABNORMAL HIGH (ref 4.6–6.5)

## 2023-11-04 MED ORDER — LOSARTAN POTASSIUM 100 MG PO TABS: ORAL_TABLET | ORAL | 3 refills | Status: AC

## 2023-11-04 NOTE — Progress Notes (Signed)
Subjective:    Patient ID: Cheryl Wood, female    DOB: August 18, 1936, 87 y.o.   MRN: [id number]  Chief Complaint  Patient presents with   Depression   Hypertension   Hyperlipidemia   Seizures   Chronic Kidney Disease   Medical Management of Chronic Issues    Pt does not have any complaints, would like to discuss [facility]     HPI Discussed the use of AI scribe software for clinical note transcription with the patient, who gave verbal consent to proceed.  History of Present Illness   The patient, an 87 year old woman with a history of hypertension, kidney disease, and osteopenia, presents with concerns about her overall health and well-being. She reports feeling significantly better after reducing her blood pressure medication dosage to 50mg  every other day, noting improved alertness and no longer experiencing episodes of sleepiness during the day. She also mentions that her blood sugar levels have been stable, with recent readings of 115 and 112.  The patient also discusses her bone density, stating that she has osteopenia but has chosen not to take additional medication for it. She reports no history of broken bones and prefers to manage her condition through diet and exercise.  She also mentions concerns about her kidney function, acknowledging the impact of her diabetes and long-term medication use on her kidney health. She reports no changes in urination.  The patient also expresses significant emotional distress related to the current political climate and its potential impact on her life and the lives of others. She reports feeling scared and worried, but also notes that she has been through a lot in her life and recognizes her blessings.       Past Medical History:  Diagnosis Date   Allergy    Arthritis    Bleeding of blood vessel    ext. genitalia area   Blood in stool    Chronic kidney disease    Depression    Glaucoma    Heart murmur    History of chicken pox     History of recurrent UTIs    History of stomach ulcers    Hyperlipidemia    Hypertension    Memory loss    Rheumatic fever    Seizures (HCC)    Stroke (HCC)    2007; embolic   TIA (transient ischemic attack) 10/22/2013   Urine incontinence     Past Surgical History:  Procedure Laterality Date   ABDOMINAL HYSTERECTOMY     APPENDECTOMY  1983   CATARACT EXTRACTION Right May 13, 2014   EYE SURGERY  12/13/2018   stent inserted in left eye   gum transplant     RIGHT/LEFT HEART CATH AND CORONARY ANGIOGRAPHY N/A 05/12/2018   Procedure: RIGHT/LEFT HEART CATH AND CORONARY ANGIOGRAPHY;  Surgeon: Lyn Records, MD;  Location: [facility] INVASIVE CV LAB;  Service: Cardiovascular;  Laterality: N/A;   THORACIC AORTOGRAM N/A 05/12/2018   Procedure: THORACIC AORTOGRAM;  Surgeon: Lyn Records, MD;  Location: [facility] INVASIVE CV LAB;  Service: Cardiovascular;  Laterality: N/A;   TONSILLECTOMY      Family History  Problem Relation Age of Onset   Cancer Mother    Early death Mother    Heart disease Father    Arthritis Father    Early death Father    Hearing loss Father    Hypertension Father    Hyperlipidemia Father    Cancer Sister    Arthritis Sister    Arthritis Brother  Diabetes Brother    Heart attack Brother    Heart disease Brother    Hyperlipidemia Brother    Hypertension Brother    Diabetes Daughter    Heart disease Daughter    Hyperlipidemia Daughter    Hyperlipidemia Maternal Grandmother    Hypertension Maternal Grandmother    Stroke Maternal Grandmother    Alcohol abuse Maternal Grandfather    Early death Maternal Grandfather        tuberculosis   Arthritis Paternal Grandmother    Alcohol abuse Paternal Grandfather    Heart disease Paternal Grandfather    Hyperlipidemia Paternal Grandfather    Hypertension Paternal Grandfather    Stroke Paternal Grandfather    Alcohol abuse Sister    COPD Sister    Drug abuse Sister    Early death Sister    Heart disease Sister     Hypertension Sister    Hyperlipidemia Sister    Early death Brother    Early death Brother     Social History   Tobacco Use   Smoking status: Former    Current packs/day: 0.00    Types: Cigarettes    Quit date: 11/05/1971    Years since quitting: 52.0   Smokeless tobacco: Never  Vaping Use   Vaping status: Never Used  Substance Use Topics   Alcohol use: No    Alcohol/week: 0.0 standard drinks of alcohol   Drug use: No     Allergies  Allergen Reactions   Dorzolamide Hcl-Timolol Mal Other (See Comments)    Red eyes   Tetanus Toxoids Swelling    Arm was twice the size it should be   Hctz [Hydrochlorothiazide] Rash    Review of Systems NEGATIVE UNLESS OTHERWISE INDICATED IN HPI      Objective:     BP 129/82   Pulse 70   Temp (!) 97.3 F (36.3 C)   Ht 5\' 1"  (1.549 m)   Wt 155 lb (70.3 kg)   LMP  (LMP Unknown)   SpO2 96%   BMI 29.29 kg/m   Wt Readings from Last 3 Encounters:  11/04/23 155 lb (70.3 kg)  07/29/23 154 lb 6.4 oz (70 kg)  06/23/23 152 lb 9.6 oz (69.2 kg)    BP Readings from Last 3 Encounters:  11/04/23 129/82  10/28/23 106/69  07/29/23 102/68     Physical Exam Vitals and nursing note reviewed.  Constitutional:      Appearance: Normal appearance.  Cardiovascular:     Rate and Rhythm: Normal rate and regular rhythm.     Pulses: Normal pulses.     Heart sounds: Normal heart sounds. No murmur heard. Pulmonary:     Effort: Pulmonary effort is normal.     Breath sounds: Normal breath sounds.  Musculoskeletal:     Right lower leg: Edema present.     Left lower leg: Edema present.  Skin:    Findings: No lesion or rash.  Neurological:     General: No focal deficit present.     Mental Status: She is alert and oriented to person, place, and time.  Psychiatric:     Comments: worried        Assessment & Plan:  Type 2 diabetes mellitus with hyperglycemia, without long-term current use of insulin (HCC) -     Basic metabolic panel -      Hemoglobin A1c  Chronic kidney disease, stage 3a (HCC) -     Basic metabolic panel -     Hemoglobin A1c  Essential hypertension -     Losartan Potassium; Take 1/2 tab po every other day.  Dispense: 45 tablet; Refill: 3  Chronic venous insufficiency  Anxiety    Assessment and Plan    Hypertension Blood pressure management complicated by episodes of hypotension. Patient has reduced Losartan dose to 50mg  every other day due to symptoms of hypotension. -Continue Losartan 50mg  every other day. -Monitor blood pressure and symptoms closely.  Chronic Kidney Disease (Stage 3) Stable with last GFR at 46. Patient is aware of the importance of hydration and blood pressure control. -Check kidney function today to monitor stability.  Osteopenia No history of bone fractures. Patient is not interested in additional medication. -Continue weight-bearing exercises and dietary calcium and vitamin D intake.  Venous Insufficiency Patient has chronic leg edema and uses surgical compression stockings. -Continue use of surgical compression stockings. -Monitor for changes in leg edema.  General Health Maintenance -Continue Vitamin B12 supplementation. -Check blood glucose levels regularly (recent readings 112-115). -Follow-up in 4 months to monitor overall health status.      Anxiety - reassured; offered counseling, she declines at this time, has good church support    Return in about 4 months (around 03/03/2024) for recheck/follow-up.   Anamae Rochelle M Onna Nodal, PA-C

## 2023-11-19 ENCOUNTER — Other Ambulatory Visit: Payer: Self-pay | Admitting: Physician Assistant

## 2023-12-16 DIAGNOSIS — Z111 Encounter for screening for respiratory tuberculosis: Secondary | ICD-10-CM | POA: Diagnosis not present

## 2023-12-16 DIAGNOSIS — Z79899 Other long term (current) drug therapy: Secondary | ICD-10-CM | POA: Diagnosis not present

## 2023-12-16 DIAGNOSIS — M0600 Rheumatoid arthritis without rheumatoid factor, unspecified site: Secondary | ICD-10-CM | POA: Diagnosis not present

## 2023-12-16 DIAGNOSIS — R5383 Other fatigue: Secondary | ICD-10-CM | POA: Diagnosis not present

## 2023-12-26 DIAGNOSIS — S52132A Displaced fracture of neck of left radius, initial encounter for closed fracture: Secondary | ICD-10-CM | POA: Diagnosis not present

## 2023-12-26 DIAGNOSIS — M25532 Pain in left wrist: Secondary | ICD-10-CM | POA: Diagnosis not present

## 2023-12-27 DIAGNOSIS — S52502D Unspecified fracture of the lower end of left radius, subsequent encounter for closed fracture with routine healing: Secondary | ICD-10-CM | POA: Diagnosis not present

## 2023-12-29 DIAGNOSIS — S62102D Fracture of unspecified carpal bone, left wrist, subsequent encounter for fracture with routine healing: Secondary | ICD-10-CM

## 2023-12-29 HISTORY — DX: Fracture of unspecified carpal bone, left wrist, subsequent encounter for fracture with routine healing: S62.102D

## 2024-01-04 DIAGNOSIS — S52502D Unspecified fracture of the lower end of left radius, subsequent encounter for closed fracture with routine healing: Secondary | ICD-10-CM | POA: Diagnosis not present

## 2024-01-06 ENCOUNTER — Ambulatory Visit: Payer: Medicare HMO | Admitting: Obstetrics and Gynecology

## 2024-01-12 ENCOUNTER — Ambulatory Visit (INDEPENDENT_AMBULATORY_CARE_PROVIDER_SITE_OTHER): Payer: 59 | Admitting: Obstetrics and Gynecology

## 2024-01-12 ENCOUNTER — Encounter: Payer: Self-pay | Admitting: Obstetrics and Gynecology

## 2024-01-12 VITALS — BP 106/68 | HR 60

## 2024-01-12 DIAGNOSIS — N952 Postmenopausal atrophic vaginitis: Secondary | ICD-10-CM

## 2024-01-12 DIAGNOSIS — N393 Stress incontinence (female) (male): Secondary | ICD-10-CM | POA: Diagnosis not present

## 2024-01-12 NOTE — Progress Notes (Signed)
 [facility]   Subjective:     Chief Complaint:  Chief Complaint  Patient presents with   Pessary Check    Cheryl Wood is a 88 y.o. female is here for pessary check.   History of Present Illness: Cheryl Wood is a 88 y.o. female with stress incontinence who presents for a pessary check. She is using a size #0 incontinence ring pessary. The pessary has been working well and she has no complaints. She has an e-string in place. She denies vaginal bleeding. She also denies leakage.   Past Medical History: Patient  has a past medical history of Allergy, Arthritis, Bleeding of blood vessel, Blood in stool, Chronic kidney disease, Depression, Glaucoma, Heart murmur, History of chicken pox, History of recurrent UTIs, History of stomach ulcers, Hyperlipidemia, Hypertension, Memory loss, Rheumatic fever, Seizures (HCC), Stroke (HCC), TIA (transient ischemic attack) (10/22/2013), and Urine incontinence.   Past Surgical History: She  has a past surgical history that includes Abdominal hysterectomy; gum transplant; Cataract extraction (Right, May 13, 2014); RIGHT/LEFT HEART CATH AND CORONARY ANGIOGRAPHY (N/A, 05/12/2018); THORACIC AORTOGRAM (N/A, 05/12/2018); Eye surgery (12/13/2018); Appendectomy (1983); and Tonsillectomy.   Medications: She has a current medication list which includes the following prescription(s): acetaminophen , b complex vitamins, brimonidine, clopidogrel , cyanocobalamin , estradiol , famciclovir, famotidine , farxiga , furosemide , gemtesa , contour next test, simponi  aria, klor-con  m20, lancets, loratadine , losartan , lovastatin , multiple vitamins-minerals, NON FORMULARY, pramipexole , prednisolone  acetate, sertraline , timolol , and vitamin d  (ergocalciferol ).   Allergies: Patient is allergic to dorzolamide  hcl-timolol  mal, tetanus toxoids, and hctz [hydrochlorothiazide].   Social History: Patient  reports that she quit smoking about 52 years ago. Her smoking use included  cigarettes. She has never used smokeless tobacco. She reports that she does not drink alcohol  and does not use drugs.      Objective:    Physical Exam: BP 106/68   Pulse 60   LMP  (LMP Unknown)  Gen: No apparent distress, A&O x 3. Detailed Urogynecologic Evaluation:  Pelvic Exam: Normal external female genitalia; Bartholin's and Skene's glands normal in appearance; urethral meatus normal in appearance, no urethral masses or discharge. The pessary was noted to be in place. It was removed and cleaned. Speculum exam revealed no lesions in the vagina and clumps of white discharge. The pessary was replaced. It was comfortable to the patient and fit well.     Assessment/Plan:    Assessment: Cheryl Wood is a 88 y.o. with stress incontinence here for a pessary check. She is doing well.  Plan: She will keep the pessary in place until next visit. She will continue to use E-string.   She will follow-up in 3 months for a pessary check or sooner as needed.   Of note, patient has a cast on her wrist related to a recent fall from her dog pulling her. She reports she is no longer walking the dog and has someone coming 3 times a day to walk him. Her blood pressure is still low, but she is working with her PCP on this. She reports she is taking 25mg  of Losartan  every other day. We discussed to keep a log of her blood pressures on days she takes the medication and days she does not so her PCP can see what her blood pressures are running. She reports she is doing this.    All questions were answered.

## 2024-01-19 DIAGNOSIS — S52502D Unspecified fracture of the lower end of left radius, subsequent encounter for closed fracture with routine healing: Secondary | ICD-10-CM | POA: Diagnosis not present

## 2024-02-03 DIAGNOSIS — M0600 Rheumatoid arthritis without rheumatoid factor, unspecified site: Secondary | ICD-10-CM | POA: Diagnosis not present

## 2024-02-03 DIAGNOSIS — H209 Unspecified iridocyclitis: Secondary | ICD-10-CM | POA: Diagnosis not present

## 2024-02-03 DIAGNOSIS — E663 Overweight: Secondary | ICD-10-CM | POA: Diagnosis not present

## 2024-02-03 DIAGNOSIS — M1991 Primary osteoarthritis, unspecified site: Secondary | ICD-10-CM | POA: Diagnosis not present

## 2024-02-03 DIAGNOSIS — Z6828 Body mass index (BMI) 28.0-28.9, adult: Secondary | ICD-10-CM | POA: Diagnosis not present

## 2024-02-03 DIAGNOSIS — Z79899 Other long term (current) drug therapy: Secondary | ICD-10-CM | POA: Diagnosis not present

## 2024-02-10 DIAGNOSIS — Z79899 Other long term (current) drug therapy: Secondary | ICD-10-CM | POA: Diagnosis not present

## 2024-02-10 DIAGNOSIS — M0600 Rheumatoid arthritis without rheumatoid factor, unspecified site: Secondary | ICD-10-CM | POA: Diagnosis not present

## 2024-02-10 DIAGNOSIS — Z111 Encounter for screening for respiratory tuberculosis: Secondary | ICD-10-CM | POA: Diagnosis not present

## 2024-02-15 ENCOUNTER — Other Ambulatory Visit: Payer: Self-pay | Admitting: Physician Assistant

## 2024-02-23 DIAGNOSIS — S52502D Unspecified fracture of the lower end of left radius, subsequent encounter for closed fracture with routine healing: Secondary | ICD-10-CM | POA: Diagnosis not present

## 2024-02-25 ENCOUNTER — Other Ambulatory Visit: Payer: Self-pay | Admitting: Physician Assistant

## 2024-03-08 ENCOUNTER — Ambulatory Visit: Payer: 59 | Admitting: Physician Assistant

## 2024-03-15 ENCOUNTER — Ambulatory Visit: Admitting: Physician Assistant

## 2024-03-22 DIAGNOSIS — H4042X2 Glaucoma secondary to eye inflammation, left eye, moderate stage: Secondary | ICD-10-CM | POA: Diagnosis not present

## 2024-03-22 DIAGNOSIS — H4041X1 Glaucoma secondary to eye inflammation, right eye, mild stage: Secondary | ICD-10-CM | POA: Diagnosis not present

## 2024-03-23 ENCOUNTER — Encounter: Payer: Self-pay | Admitting: Physician Assistant

## 2024-03-23 ENCOUNTER — Ambulatory Visit (INDEPENDENT_AMBULATORY_CARE_PROVIDER_SITE_OTHER): Admitting: Physician Assistant

## 2024-03-23 VITALS — BP 100/66 | HR 61 | Temp 97.2°F | Ht 61.0 in | Wt 151.6 lb

## 2024-03-23 DIAGNOSIS — R600 Localized edema: Secondary | ICD-10-CM

## 2024-03-23 DIAGNOSIS — M25532 Pain in left wrist: Secondary | ICD-10-CM

## 2024-03-23 DIAGNOSIS — G629 Polyneuropathy, unspecified: Secondary | ICD-10-CM | POA: Diagnosis not present

## 2024-03-23 DIAGNOSIS — Z7984 Long term (current) use of oral hypoglycemic drugs: Secondary | ICD-10-CM

## 2024-03-23 DIAGNOSIS — E1165 Type 2 diabetes mellitus with hyperglycemia: Secondary | ICD-10-CM

## 2024-03-23 DIAGNOSIS — G2581 Restless legs syndrome: Secondary | ICD-10-CM

## 2024-03-23 LAB — COMPREHENSIVE METABOLIC PANEL WITH GFR
ALT: 14 U/L (ref 0–35)
AST: 16 U/L (ref 0–37)
Albumin: 4.5 g/dL (ref 3.5–5.2)
Alkaline Phosphatase: 64 U/L (ref 39–117)
BUN: 23 mg/dL (ref 6–23)
CO2: 29 meq/L (ref 19–32)
Calcium: 9.6 mg/dL (ref 8.4–10.5)
Chloride: 105 meq/L (ref 96–112)
Creatinine, Ser: 1.07 mg/dL (ref 0.40–1.20)
GFR: 47.23 mL/min — ABNORMAL LOW (ref >60.00)
Glucose, Bld: 139 mg/dL — ABNORMAL HIGH (ref 70–99)
Potassium: 3.7 meq/L (ref 3.5–5.1)
Sodium: 143 meq/L (ref 135–145)
Total Bilirubin: 1 mg/dL (ref 0.2–1.2)
Total Protein: 6.8 g/dL (ref 6.0–8.3)

## 2024-03-23 LAB — POCT GLYCOSYLATED HEMOGLOBIN (HGB A1C): Hemoglobin A1C: 6.4 % — AB (ref 4.0–5.6)

## 2024-03-23 LAB — MAGNESIUM: Magnesium: 2.3 mg/dL (ref 1.5–2.5)

## 2024-03-23 NOTE — Progress Notes (Signed)
 Patient ID: Cheryl Wood, female    DOB: [birth year], 88 y.o.   MRN: [id number]   Assessment & Plan:  Type 2 diabetes mellitus with hyperglycemia, without long-term current use of insulin (HCC) -     POCT glycosylated hemoglobin (Hb A1C) -     Microalbumin / creatinine urine ratio  RLS (restless legs syndrome) -     Comprehensive metabolic panel with GFR -     Magnesium -     Vitamin B12 -     IBC + Ferritin  Neuropathy -     Comprehensive metabolic panel with GFR -     Magnesium -     Vitamin B12 -     IBC + Ferritin  Left wrist pain  Bilateral lower extremity edema      Assessment and Plan Assessment & Plan Restless Leg Syndrome Experiences worsening symptoms with nocturnal leg jerking. Potassium was slightly low in November, potentially exacerbating symptoms. Other contributors may include low iron, magnesium, or B12 levels. Exercise may help alleviate symptoms. - Order blood tests to check potassium, iron, magnesium, and B12 levels - Consider increasing Mirapex dose if lab values are corrected and symptoms persist  Peripheral Neuropathy Reports numbness and cold sensation in the thumb and toes, likely due to diabetic neuropathy. No sores or broken skin. Sensation intact upon needle prick testing. Rheumatologist has not been informed of these symptoms. - Inform rheumatologist about neuropathy symptoms  Diabetes Mellitus Type 2 Well-controlled with an A1c of 6.4, improved from 6.7 in November. - Farxiga 5 mg daily; otherwise lifestyle controlled  Edema On two Lasix daily for leg swelling, improved with compression stockings. Right leg more affected, possibly due to previous stroke. Potassium supplementation in place, levels need monitoring. Compression stockings are well-tolerated and effective. - Continue Lasix and compression stockings - Monitor potassium levels and adjust supplementation if needed  Closed Fracture of Left Radius Sustained a closed fracture of  the lower end of the left radius before Thanksgiving. Wrist strength improving with home exercises. No surgery performed, was in a brace. One follow-up appointment remains. - Continue home exercises for wrist rehabilitation - Attend follow-up appointment for wrist evaluation  Unintentional Weight Loss Reports weight drop from 155-156 lbs to 148 lbs, with no change in appetite or eating habits. Weight loss is unintentional and may be related to decreased physical activity. No signs of systemic illness noted. - Monitor weight and reassess in three months      Return in about 3 months (around 06/23/2024) for recheck/follow-up.    Subjective:    Chief Complaint  Patient presents with   Medical Management of Chronic Issues    Pt in office for diabetes follow up; POC A1C check and urine overdue and collecting today; pt states that has been losing more weight than she thinks she should; pt denies changes in her diet; pt glucose this morning was 130 fasting; medication for RLS may need to be increased due to waking up at night with legs jerking;     HPI Discussed the use of AI scribe software for clinical note transcription with the patient, who gave verbal consent to proceed.  History of Present Illness Cheryl Wood is an 88 year old female with diabetes who presents for follow-up on her diabetes management and other health concerns. She is accompanied by her daughter, Asher Muir.  Her diabetes management has improved, with an A1c of 6.4, down from 6.7 in November, indicating good control. She has not  significantly changed her exercise routine since she stopped walking her dog after an incident where she was pulled over and broke her wrist.  She experienced a closed fracture of the lower end of the left radius before Thanksgiving, confirmed by an x-ray. She did not undergo surgery but was in a brace. Her wrist is not as strong as before, but she is doing exercises at home to improve  strength.  She describes a sensation of coldness or numbness in her thumb and toes, which she attributes to neuropathy from diabetes. No sores or broken skin are present, and her wounds heal well.  She experiences restless leg syndrome, with symptoms worsening over the past several months. She wakes up in the middle of the night due to leg jerking, which shakes the bed. She is currently taking Mirapex 0.25 mg at night for this condition.  She takes two Lasix daily for leg swelling, which has improved with the use of compression socks. She also takes 20 mEq of potassium once a day.  She reports a recent weight fluctuation, noting a decrease to 148 pounds from her usual 155-156 pounds. She attributes this to a lack of exercise since she stopped walking her dog. Her appetite and eating habits have not changed.  She experiences urinary incontinence, particularly after long naps, which she attributes to the diuretic effect of Lasix. She uses a pessary and reports no other urinary issues.      Past Medical History:  Diagnosis Date   Allergy    Arthritis    Bleeding of blood vessel    ext. genitalia area   Blood in stool    Chronic kidney disease    Depression    Glaucoma    Heart murmur    History of chicken pox    History of recurrent UTIs    History of stomach ulcers    Hyperlipidemia    Hypertension    Memory loss    Rheumatic fever    Seizures (HCC)    Stroke (HCC)    2007; embolic   TIA (transient ischemic attack) 10/22/2013   Urine incontinence     Past Surgical History:  Procedure Laterality Date   ABDOMINAL HYSTERECTOMY     APPENDECTOMY  1983   CATARACT EXTRACTION Right May 13, 2014   EYE SURGERY  12/13/2018   stent inserted in left eye   gum transplant     RIGHT/LEFT HEART CATH AND CORONARY ANGIOGRAPHY N/A 05/12/2018   Procedure: RIGHT/LEFT HEART CATH AND CORONARY ANGIOGRAPHY;  Surgeon: Lyn Records, MD;  Location: [facility] INVASIVE CV LAB;  Service: Cardiovascular;   Laterality: N/A;   THORACIC AORTOGRAM N/A 05/12/2018   Procedure: THORACIC AORTOGRAM;  Surgeon: Lyn Records, MD;  Location: [facility] INVASIVE CV LAB;  Service: Cardiovascular;  Laterality: N/A;   TONSILLECTOMY      Family History  Problem Relation Age of Onset   Cancer Mother    Early death Mother    Heart disease Father    Arthritis Father    Early death Father    Hearing loss Father    Hypertension Father    Hyperlipidemia Father    Cancer Sister    Arthritis Sister    Arthritis Brother    Diabetes Brother    Heart attack Brother    Heart disease Brother    Hyperlipidemia Brother    Hypertension Brother    Diabetes Daughter    Heart disease Daughter    Hyperlipidemia Daughter  Hyperlipidemia Maternal Grandmother    Hypertension Maternal Grandmother    Stroke Maternal Grandmother    Alcohol abuse Maternal Grandfather    Early death Maternal Grandfather        tuberculosis   Arthritis Paternal Grandmother    Alcohol abuse Paternal Grandfather    Heart disease Paternal Grandfather    Hyperlipidemia Paternal Grandfather    Hypertension Paternal Grandfather    Stroke Paternal Grandfather    Alcohol abuse Sister    COPD Sister    Drug abuse Sister    Early death Sister    Heart disease Sister    Hypertension Sister    Hyperlipidemia Sister    Early death Brother    Early death Brother     Social History   Tobacco Use   Smoking status: Former    Current packs/day: 0.00    Types: Cigarettes    Quit date: 11/05/1971    Years since quitting: 52.4   Smokeless tobacco: Never  Vaping Use   Vaping status: Never Used  Substance Use Topics   Alcohol use: No    Alcohol/week: 0.0 standard drinks of alcohol   Drug use: No     Allergies  Allergen Reactions   Dorzolamide Hcl-Timolol Mal Other (See Comments)    Red eyes   Tetanus Toxoids Swelling    Arm was twice the size it should be   Hctz [Hydrochlorothiazide] Rash    Review of Systems NEGATIVE UNLESS  OTHERWISE INDICATED IN HPI      Objective:     BP 100/66 (BP Location: Left Arm, Patient Position: Sitting, Cuff Size: Normal)   Pulse 61   Temp (!) 97.2 F (36.2 C) (Temporal)   Ht 5\' 1"  (1.549 m)   Wt 151 lb 9.6 oz (68.8 kg)   LMP  (LMP Unknown)   SpO2 99%   BMI 28.64 kg/m   Wt Readings from Last 3 Encounters:  03/23/24 151 lb 9.6 oz (68.8 kg)  11/04/23 155 lb (70.3 kg)  07/29/23 154 lb 6.4 oz (70 kg)    BP Readings from Last 3 Encounters:  03/23/24 100/66  01/12/24 106/68  11/04/23 129/82     Physical Exam Vitals and nursing note reviewed.  Constitutional:      Appearance: Normal appearance.  HENT:     Head: Normocephalic.     Mouth/Throat:     Mouth: Mucous membranes are moist.  Eyes:     Extraocular Movements: Extraocular movements intact.     Conjunctiva/sclera: Conjunctivae normal.     Pupils: Pupils are equal, round, and reactive to light.  Cardiovascular:     Rate and Rhythm: Normal rate and regular rhythm.     Pulses: Normal pulses.     Heart sounds: No murmur heard. Pulmonary:     Effort: Pulmonary effort is normal.     Breath sounds: Normal breath sounds. No wheezing or rales.  Musculoskeletal:     Right lower leg: No edema.     Left lower leg: No edema.  Neurological:     General: No focal deficit present.     Mental Status: She is alert and oriented to person, place, and time.     Cranial Nerves: No cranial nerve deficit.     Sensory: No sensory deficit (neurovasc normal in fingers / toes / feet / hands).     Motor: No weakness.     Coordination: Coordination normal.     Gait: Gait normal.  Psychiatric:  Mood and Affect: Mood normal.        Behavior: Behavior normal.        Thought Content: Thought content normal.        Judgment: Judgment normal.       Farren Nelles M Eulogio Requena, PA-C

## 2024-03-23 NOTE — Patient Instructions (Addendum)
 Ha1c was 6.4% today - excellent work!   Recommend walking still, just have someone go with you, keep cell phone on you, use walker or cane if needed - fall prevention important  We'll check labs today and call you if anything needs change in treatment plan   Monitor your weight - if losing more before you see me, please call back sooner

## 2024-03-24 ENCOUNTER — Other Ambulatory Visit: Payer: Self-pay | Admitting: Physician Assistant

## 2024-03-24 ENCOUNTER — Telehealth: Payer: Self-pay | Admitting: Physician Assistant

## 2024-03-24 LAB — IBC + FERRITIN
Ferritin: 143.8 ng/mL (ref 10.0–291.0)
Iron: 100 ug/dL (ref 42–145)
Saturation Ratios: 33.7 % (ref 20.0–50.0)
TIBC: 296.8 ug/dL (ref 250.0–450.0)
Transferrin: 212 mg/dL (ref 212.0–360.0)

## 2024-03-24 LAB — VITAMIN B12: Vitamin B-12: 963 pg/mL — ABNORMAL HIGH (ref 211–911)

## 2024-03-24 MED ORDER — PRAMIPEXOLE DIHYDROCHLORIDE 0.5 MG PO TABS
0.5000 mg | ORAL_TABLET | Freq: Every day | ORAL | 2 refills | Status: DC
Start: 2024-03-24 — End: 2024-06-28

## 2024-03-24 NOTE — Telephone Encounter (Signed)
 Called pt and LVM informing that another urine sample is needed.

## 2024-03-27 ENCOUNTER — Telehealth: Payer: Self-pay

## 2024-03-27 ENCOUNTER — Other Ambulatory Visit: Payer: Self-pay

## 2024-03-27 DIAGNOSIS — E1165 Type 2 diabetes mellitus with hyperglycemia: Secondary | ICD-10-CM

## 2024-03-27 NOTE — Telephone Encounter (Signed)
 Called pt to advise regarding urine sample needing to be re collected. Pt states she spoke with MaKyla in lab and was unable to void. She was given all she needed to collect urine at home and bring to the office. Pt states she has an appt on Wednesday and will bring a fresh sample when coming in the office. Please advise future lab orders needed for urinalysis for patient.

## 2024-03-27 NOTE — Telephone Encounter (Signed)
 Future orders placed

## 2024-03-29 ENCOUNTER — Other Ambulatory Visit (INDEPENDENT_AMBULATORY_CARE_PROVIDER_SITE_OTHER)

## 2024-03-29 DIAGNOSIS — E1165 Type 2 diabetes mellitus with hyperglycemia: Secondary | ICD-10-CM | POA: Diagnosis not present

## 2024-03-29 LAB — MICROALBUMIN / CREATININE URINE RATIO
Creatinine,U: 34.4 mg/dL
Microalb Creat Ratio: 75.2 mg/g — ABNORMAL HIGH (ref 0.0–30.0)
Microalb, Ur: 2.6 mg/dL — ABNORMAL HIGH (ref 0.0–1.9)

## 2024-04-05 DIAGNOSIS — S52502D Unspecified fracture of the lower end of left radius, subsequent encounter for closed fracture with routine healing: Secondary | ICD-10-CM | POA: Diagnosis not present

## 2024-04-06 DIAGNOSIS — M0600 Rheumatoid arthritis without rheumatoid factor, unspecified site: Secondary | ICD-10-CM | POA: Diagnosis not present

## 2024-04-07 ENCOUNTER — Other Ambulatory Visit: Payer: Self-pay | Admitting: Physician Assistant

## 2024-04-07 ENCOUNTER — Other Ambulatory Visit: Payer: Self-pay | Admitting: Obstetrics and Gynecology

## 2024-04-07 DIAGNOSIS — N952 Postmenopausal atrophic vaginitis: Secondary | ICD-10-CM

## 2024-04-10 ENCOUNTER — Ambulatory Visit (INDEPENDENT_AMBULATORY_CARE_PROVIDER_SITE_OTHER): Payer: Medicare HMO

## 2024-04-10 VITALS — Ht 61.0 in | Wt 149.0 lb

## 2024-04-10 DIAGNOSIS — Z0000 Encounter for general adult medical examination without abnormal findings: Secondary | ICD-10-CM | POA: Diagnosis not present

## 2024-04-10 NOTE — Patient Instructions (Signed)
 Cheryl Wood , Thank you for taking time to come for your Medicare Wellness Visit. I appreciate your ongoing commitment to your health goals. Please review the following plan we discussed and let me know if I can assist you in the future.   Referrals/Orders/Follow-Ups/Clinician Recommendations: Each day, aim for 6 glasses of water, plenty of protein in your diet and try to get up and walk/ stretch every hour for 5-10 minutes at a time.  Maintain health and activity   This is a list of the screening recommended for you and due dates:  Health Maintenance  Topic Date Due   COVID-19 Vaccine (6 - 2024-25 season) 11/05/2023   Eye exam for diabetics  11/25/2023   Flu Shot  07/28/2024   Hemoglobin A1C  09/23/2024   Mammogram  10/26/2024   Yearly kidney function blood test for diabetes  03/23/2025   Complete foot exam   03/23/2025   Yearly kidney health urinalysis for diabetes  03/29/2025   Medicare Annual Wellness Visit  04/10/2025   Pneumonia Vaccine  Completed   DEXA scan (bone density measurement)  Completed   HPV Vaccine  Aged Out   Meningitis B Vaccine  Aged Out   DTaP/Tdap/Td vaccine  Discontinued   Zoster (Shingles) Vaccine  Discontinued    Advanced directives: (Copy Requested) Please bring a copy of your health care power of attorney and living will to the office to be added to your chart at your convenience. You can mail to [facility] [address] [place], [place] [address] or email to [email or web address]  Next Medicare Annual Wellness Visit scheduled for next year: Yes

## 2024-04-10 NOTE — Progress Notes (Addendum)
 Subjective:   Cheryl Wood is a 88 y.o. who presents for a Medicare Wellness preventive visit.  Visit Complete: Virtual I connected with  Fabio Asa on 04/10/24 by a audio enabled telemedicine application and verified that I am speaking with the correct person using two identifiers.  Patient Location: Home  Provider Location: Office/Clinic  I discussed the limitations of evaluation and management by telemedicine. The patient expressed understanding and agreed to proceed.  Vital Signs: Because this visit was a virtual/telehealth visit, some criteria may be missing or patient reported. Any vitals not documented were not able to be obtained and vitals that have been documented are patient reported.    Persons Participating in Visit: Patient.  AWV Questionnaire: No: Patient Medicare AWV questionnaire was not completed prior to this visit.  Cardiac Risk Factors include: advanced age (>82men, >56 women);diabetes mellitus;hypertension;dyslipidemia     Objective:    Today's Vitals   04/10/24 1304  Weight: 149 lb (67.6 kg)  Height: 5\' 1"  (1.549 m)   Body mass index is 28.15 kg/m.     04/10/2024    1:16 PM 03/26/2023    9:54 PM 03/18/2023    1:58 PM 06/12/2022    4:00 PM 03/16/2022    8:18 AM 08/28/2021    9:59 AM 07/23/2021   10:27 AM  Advanced Directives  Does Patient Have a Medical Advance Directive? Yes No Yes No Yes No No  Type of Estate agent of [place];Living will  Healthcare Power of [place];Living will  Healthcare Power of Attorney    Copy of Healthcare Power of Attorney in Chart? No - copy requested  No - copy requested  No - copy requested    Would patient like information on creating a medical advance directive?    No - Patient declined   No - Patient declined    Current Medications (verified) Outpatient Encounter Medications as of 04/10/2024  Medication Sig   acetaminophen (TYLENOL) 650 MG CR tablet Take 650 mg by mouth every 8 (eight)  hours as needed for pain. Taking 2 in am and 2 at pm   b complex vitamins capsule Take 1 capsule by mouth daily.   brimonidine (ALPHAGAN) 0.2 % ophthalmic solution Place 1 drop into both eyes 2 (two) times daily.   clopidogrel (PLAVIX) 75 MG tablet TAKE 1 TABLET BY MOUTH DAILY   cyanocobalamin (VITAMIN B12) 1000 MCG tablet Take 1,000 mcg by mouth daily.   ESTRING 7.5 MCG/24HR vaginal ring PLACE 1 RING INTO THE VAGINA EVERY 3 MONTHS AS DIRECTED   famciclovir (FAMVIR) 500 MG tablet Take 500 mg by mouth daily.    famotidine (PEPCID) 20 MG tablet TAKE 1 TABLET BY MOUTH 2 TIMES A DAY   FARXIGA 5 MG TABS tablet TAKE 1 TABLET BY MOUTH DAILY BEFORE BREAKFAST   furosemide (LASIX) 80 MG tablet TAKE 2 TABLETS BY MOUTH DAILY   GEMTESA 75 MG TABS TAKE 1 TABLET BY MOUTH DAILY   glucose blood (CONTOUR NEXT TEST) test strip Use as instructed   golimumab (SIMPONI ARIA) 50 MG/4ML SOLN injection Inject into the vein. Every 8 weeks   KLOR-CON M20 20 MEQ tablet TAKE 1 TABLET BY MOUTH DAILY   Lancets MISC Check blood sugar once daily. Contour Next Device.   loratadine (CLARITIN) 10 MG tablet Take 10 mg by mouth daily.   losartan (COZAAR) 100 MG tablet Take 1/2 tab po every other day.   lovastatin (MEVACOR) 40 MG tablet TAKE 1 TABLET BY MOUTH  AT BEDTIME   Multiple Vitamins-Minerals (CENTRUM SILVER 50+WOMEN PO) Take 1 tablet by mouth daily.   NON FORMULARY Take 900 mg by mouth in the morning.   pramipexole (MIRAPEX) 0.5 MG tablet Take 1 tablet (0.5 mg total) by mouth at bedtime.   prednisoLONE acetate (PRED FORTE) 1 % ophthalmic suspension Place 1 drop into both eyes in the morning and at bedtime.   sertraline (ZOLOFT) 25 MG tablet TAKE 1 TABLET BY MOUTH DAILY   timolol (BETIMOL) 0.5 % ophthalmic solution Place 1 drop into the right eye 2 (two) times daily.   No facility-administered encounter medications on file as of 04/10/2024.    Allergies (verified) Dorzolamide hcl-timolol mal, Tetanus toxoids, and Hctz  [hydrochlorothiazide]   History: Past Medical History:  Diagnosis Date   Allergy    Arthritis    Bleeding of blood vessel    ext. genitalia area   Blood in stool    Broken wrist, left, with routine healing, subsequent encounter 2025   pt stated declined surgery   Chronic kidney disease    Depression    Glaucoma    Heart murmur    History of chicken pox    History of recurrent UTIs    History of stomach ulcers    Hyperlipidemia    Hypertension    Memory loss    Rheumatic fever    Seizures (HCC)    Stroke (HCC)    2007; embolic   TIA (transient ischemic attack) 10/22/2013   Urine incontinence    Past Surgical History:  Procedure Laterality Date   ABDOMINAL HYSTERECTOMY     APPENDECTOMY  1983   CATARACT EXTRACTION Right May 13, 2014   EYE SURGERY  12/13/2018   stent inserted in left eye   gum transplant     RIGHT/LEFT HEART CATH AND CORONARY ANGIOGRAPHY N/A 05/12/2018   Procedure: RIGHT/LEFT HEART CATH AND CORONARY ANGIOGRAPHY;  Surgeon: Arty Binning, MD;  Location: [facility] INVASIVE CV LAB;  Service: Cardiovascular;  Laterality: N/A;   THORACIC AORTOGRAM N/A 05/12/2018   Procedure: THORACIC AORTOGRAM;  Surgeon: Arty Binning, MD;  Location: [facility] INVASIVE CV LAB;  Service: Cardiovascular;  Laterality: N/A;   TONSILLECTOMY     Family History  Problem Relation Age of Onset   Cancer Mother    Early death Mother    Heart disease Father    Arthritis Father    Early death Father    Hearing loss Father    Hypertension Father    Hyperlipidemia Father    Cancer Sister    Arthritis Sister    Arthritis Brother    Diabetes Brother    Heart attack Brother    Heart disease Brother    Hyperlipidemia Brother    Hypertension Brother    Diabetes Daughter    Heart disease Daughter    Hyperlipidemia Daughter    Hyperlipidemia Maternal Grandmother    Hypertension Maternal Grandmother    Stroke Maternal Grandmother    Alcohol abuse Maternal Grandfather    Early death Maternal  Grandfather        tuberculosis   Arthritis Paternal Grandmother    Alcohol abuse Paternal Grandfather    Heart disease Paternal Grandfather    Hyperlipidemia Paternal Grandfather    Hypertension Paternal Grandfather    Stroke Paternal Grandfather    Alcohol abuse Sister    COPD Sister    Drug abuse Sister    Early death Sister    Heart disease Sister    Hypertension  Sister    Hyperlipidemia Sister    Early death Brother    Early death Brother    Social History   Socioeconomic History   Marital status: Divorced    Spouse name: Not on file   Number of children: 2   Years of education: college   Highest education level: Not on file  Occupational History    Employer: RETIRED  Tobacco Use   Smoking status: Former    Current packs/day: 0.00    Types: Cigarettes    Start date: 11/04/1941    Quit date: 11/05/1971    Years since quitting: 52.4   Smokeless tobacco: Never  Vaping Use   Vaping status: Never Used  Substance and Sexual Activity   Alcohol use: No    Alcohol/week: 0.0 standard drinks of alcohol   Drug use: No   Sexual activity: Not Currently  Other Topics Concern   Not on file  Social History Narrative   Patient lives at home alone   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[employer], [employer] (consulting)   Worked at [employer] for 18 years   Daughters in [place] and in [place]   Caffeine Use: 2-3 cups daily   Social Drivers of Health   Financial Resource Strain: Low Risk  (04/10/2024)   Overall Financial Resource Strain (CARDIA)    Difficulty of Paying Living Expenses: Not hard at all  Food Insecurity: No Food Insecurity (04/10/2024)   Hunger Vital Sign    Worried About Running Out of Food in the Last Year: Never true    Ran Out of Food in the Last Year: Never true  Transportation Needs: No Transportation Needs (04/10/2024)   PRAPARE - Administrator, Civil Service (Medical): No    Lack of Transportation (Non-Medical): No  Physical Activity:  Insufficiently Active (04/10/2024)   Exercise Vital Sign    Days of Exercise per Week: 3 days    Minutes of Exercise per Session: 30 min  Stress: No Stress Concern Present (04/10/2024)   [employer] of Occupational Health - Occupational Stress Questionnaire    Feeling of Stress : Not at all  Social Connections: Moderately Isolated (04/10/2024)   Social Connection and Isolation Panel [NHANES]    Frequency of Communication with Friends and Family: More than three times a week    Frequency of Social Gatherings with Friends and Family: Three times a week    Attends Religious Services: More than 4 times per year    Active Member of Clubs or Organizations: No    Attends Banker Meetings: Never    Marital Status: Divorced    Tobacco Counseling Counseling given: Not Answered    Clinical Intake:  Pre-visit preparation completed: Yes  Pain : No/denies pain     BMI - recorded: 28.15 Nutritional Status: BMI 25 -29 Overweight Nutritional Risks: None Diabetes: Yes CBG done?: No Did pt. bring in CBG monitor from home?: No  Lab Results  Component Value Date   HGBA1C 6.4 (A) 03/23/2024   HGBA1C 6.7 (H) 11/04/2023   HGBA1C 6.8 (H) 06/23/2023     How often do you need to have someone help you when you read instructions, pamphlets, or other written materials from your doctor or pharmacy?: 1 - Never  Interpreter Needed?: No  Information entered by :: Lanier Ensign, LPN   Activities of Daily Living     04/10/2024    1:06 PM  In your present state of health, do  you have any difficulty performing the following activities:  Hearing? 1  Comment hearing aids  Vision? 1  Comment eye concerns  Difficulty concentrating or making decisions? 0  Walking or climbing stairs? 1  Comment avoid stairs  Dressing or bathing? 0  Doing errands, shopping? 0  Preparing Food and eating ? N  Using the Toilet? N  In the past six months, have you accidently leaked urine? Y   Comment wears a pesary  Do you have problems with loss of bowel control? N  Managing your Medications? N  Managing your Finances? N  Housekeeping or managing your Housekeeping? N    Patient Care Team: Allwardt, [name] M, PA-C as PCP - General (Physician Assistant) Avanell Leigh, MD as PCP - Cardiology (Cardiology) Myrle Aspen, [facility] (Inactive) as Pharmacist (Pharmacist)  Indicate any recent Medical Services you may have received from other than [facility] providers in the past year (date may be approximate).     Assessment:   This is a routine wellness examination for Penina.  Hearing/Vision screen Hearing Screening - Comments:: Pt has hearing aids  Vision Screening - Comments:: Wears rx glasses - up to date with routine eye exams with Dr Leanor Proper and Sr Mason Sole [facility]     Goals Addressed             This Visit's Progress    Patient Stated       Each day, aim for 6 glasses of water, plenty of protein in your diet and try to get up and walk/ stretch every hour for 5-10 minutes at a time.         Depression Screen     04/10/2024    1:14 PM 11/04/2023   10:30 AM 04/08/2023    9:01 AM 03/18/2023    1:59 PM 02/22/2023    8:00 AM 03/16/2022    8:16 AM 07/02/2021    2:42 PM  PHQ 2/9 Scores  PHQ - 2 Score 0 3 0 0 0 0 0  PHQ- 9 Score  6 0 0 2  0    Fall Risk     04/10/2024    1:15 PM 03/23/2024   10:38 AM 11/04/2023   10:30 AM 06/23/2023    8:08 AM 04/08/2023    9:01 AM  Fall Risk   Falls in the past year? 1 0 0 0 1  Number falls in past yr: 1 0 0 0 0  Injury with Fall? 1 0 0 0 1  Comment left wrist broken      Risk for fall due to : Impaired balance/gait No Fall Risks No Fall Risks No Fall Risks Impaired mobility  Follow up Falls prevention discussed Falls evaluation completed Falls evaluation completed Falls prevention discussed;Falls evaluation completed Falls evaluation completed    MEDICARE RISK AT HOME:  Medicare Risk at Home Any stairs in or around the  home?: No If so, are there any without handrails?: No Home free of loose throw rugs in walkways, pet beds, electrical cords, etc?: Yes Adequate lighting in your home to reduce risk of falls?: Yes Life alert?: No Use of a cane, walker or w/c?: No Grab bars in the bathroom?: Yes Shower chair or bench in shower?: Yes Elevated toilet seat or a handicapped toilet?: Yes  TIMED UP AND GO:  Was the test performed?  No  Cognitive Function: 6CIT completed    01/28/2015    2:22 PM 07/18/2014    2:16 PM  MMSE -  Mini Mental State Exam  Orientation to time 5 5  Orientation to Place 5 5  Registration 3 3  Attention/ Calculation 4 2  Recall 2 3  Language- name 2 objects 2 2  Language- repeat 1 1  Language- follow 3 step command 3 3  Language- read & follow direction 1 1  Write a sentence 1 1  Copy design 1 0  Total score 28 26        04/10/2024    1:18 PM 03/18/2023    2:01 PM 03/16/2022    8:23 AM 03/10/2021    8:20 AM 01/30/2020    2:44 PM  6CIT Screen  What Year? 0 points 0 points 0 points 0 points 0 points  What month? 0 points 0 points 0 points 0 points 0 points  What time? 0 points 0 points 0 points  0 points  Count back from 20 0 points 0 points 0 points 0 points 0 points  Months in reverse 0 points 4 points 0 points 0 points 0 points  Repeat phrase 0 points 0 points 0 points 2 points 2 points  Total Score 0 points 4 points 0 points  2 points    Immunizations Immunization History  Administered Date(s) Administered   Fluad Quad(high Dose 65+) 09/29/2019, 10/03/2020, 08/03/2022   Fluzone Influenza virus vaccine,trivalent (IIV3), split virus 10/04/2014   Influenza Split 08/30/2017   Influenza, High Dose Seasonal PF 09/10/2023   Influenza-Unspecified 09/28/2018, 10/16/2021   Moderna Covid-19 Vaccine Bivalent Booster 47yrs & up 10/16/2021, 08/03/2022   Moderna SARS-COV2 Booster Vaccination 01/31/2021, 10/16/2021   Moderna Sars-Covid-2 Vaccination 02/09/2020, 03/08/2020,  08/27/2020, 09/10/2023   Pneumococcal Conjugate-13 03/29/2015   Pneumococcal Polysaccharide-23 11/01/2019   Pneumococcal-Unspecified 03/09/2014   RSV,unspecified 12/12/2022    Screening Tests Health Maintenance  Topic Date Due   COVID-19 Vaccine (6 - 2024-25 season) 11/05/2023   OPHTHALMOLOGY EXAM  11/25/2023   INFLUENZA VACCINE  07/28/2024   HEMOGLOBIN A1C  09/23/2024   MAMMOGRAM  10/26/2024   Diabetic kidney evaluation - eGFR measurement  03/23/2025   FOOT EXAM  03/23/2025   Diabetic kidney evaluation - Urine ACR  03/29/2025   Medicare Annual Wellness (AWV)  04/10/2025   Pneumonia Vaccine 8+ Years old  Completed   DEXA SCAN  Completed   HPV VACCINES  Aged Out   Meningococcal B Vaccine  Aged Out   DTaP/Tdap/Td  Discontinued   Zoster Vaccines- Shingrix  Discontinued    Health Maintenance  Health Maintenance Due  Topic Date Due   COVID-19 Vaccine (6 - 2024-25 season) 11/05/2023   OPHTHALMOLOGY EXAM  11/25/2023   Health Maintenance Items Addressed: See Nurse Notes  Additional Screening:  Vision Screening: Recommended annual ophthalmology exams for early detection of glaucoma and other disorders of the eye.  Dental Screening: Recommended annual dental exams for proper oral hygiene  Community Resource Referral / Chronic Care Management: CRR required this visit?  No   CCM required this visit?  No     Plan:     I have personally reviewed and noted the following in the patient's chart:   Medical and social history Use of alcohol, tobacco or illicit drugs  Current medications and supplements including opioid prescriptions. Patient is not currently taking opioid prescriptions. Functional ability and status Nutritional status Physical activity Advanced directives List of other physicians Hospitalizations, surgeries, and ER visits in previous 12 months Vitals Screenings to include cognitive, depression, and falls Referrals and appointments  In addition, I  have reviewed  and discussed with patient certain preventive protocols, quality metrics, and best practice recommendations. A written personalized care plan for preventive services as well as general preventive health recommendations were provided to patient.     Bruno Capri, LPN   7/82/9562   After Visit Summary: (MyChart) Due to this being a telephonic visit, the after visit summary with patients personalized plan was offered to patient via MyChart   Notes: Nothing significant to report at this time.

## 2024-04-12 ENCOUNTER — Encounter: Payer: Self-pay | Admitting: Obstetrics and Gynecology

## 2024-04-12 ENCOUNTER — Ambulatory Visit (INDEPENDENT_AMBULATORY_CARE_PROVIDER_SITE_OTHER): Payer: 59 | Admitting: Obstetrics and Gynecology

## 2024-04-12 VITALS — BP 112/68

## 2024-04-12 DIAGNOSIS — N952 Postmenopausal atrophic vaginitis: Secondary | ICD-10-CM

## 2024-04-12 DIAGNOSIS — N393 Stress incontinence (female) (male): Secondary | ICD-10-CM | POA: Diagnosis not present

## 2024-04-12 NOTE — Progress Notes (Signed)
 [facility]   Subjective:     Chief Complaint:  Chief Complaint  Patient presents with   Pessary check and estring replacement   History of Present Illness: Cheryl Wood is a 88 y.o. female with stress incontinence who presents for a pessary check. She is using a size #0 incontinence ring pessary. The pessary has been working well and she has no complaints. She has an e-string in place. She reports one episode of mild vaginal bleeding that she controlled with estrogen cream. She also denies leakage.   Past Medical History: Patient  has a past medical history of Allergy, Arthritis, Bleeding of blood vessel, Blood in stool, Broken wrist, left, with routine healing, subsequent encounter (2025), Chronic kidney disease, Depression, Glaucoma, Heart murmur, History of chicken pox, History of recurrent UTIs, History of stomach ulcers, Hyperlipidemia, Hypertension, Memory loss, Rheumatic fever, Seizures (HCC), Stroke (HCC), TIA (transient ischemic attack) (10/22/2013), and Urine incontinence.   Past Surgical History: She  has a past surgical history that includes Abdominal hysterectomy; gum transplant; Cataract extraction (Right, May 13, 2014); RIGHT/LEFT HEART CATH AND CORONARY ANGIOGRAPHY (N/A, 05/12/2018); THORACIC AORTOGRAM (N/A, 05/12/2018); Eye surgery (12/13/2018); Appendectomy (1983); and Tonsillectomy.   Medications: She has a current medication list which includes the following prescription(s): acetaminophen, b complex vitamins, brimonidine, clopidogrel, cyanocobalamin, estring, famciclovir, famotidine, farxiga, furosemide, gemtesa, contour next test, simponi aria, klor-con m20, lancets, loratadine, losartan, lovastatin, multiple vitamins-minerals, NON FORMULARY, pramipexole, prednisolone acetate, sertraline, and timolol.   Allergies: Patient is allergic to dorzolamide hcl-timolol mal, tetanus toxoids, and hctz [hydrochlorothiazide].   Social History: Patient  reports that  she quit smoking about 52 years ago. Her smoking use included cigarettes. She started smoking about 82 years ago. She has never used smokeless tobacco. She reports that she does not drink alcohol and does not use drugs.      Objective:    Physical Exam: BP 112/68 (BP Location: Left Arm, Patient Position: Sitting, Cuff Size: Normal)   LMP  (LMP Unknown)  Gen: No apparent distress, A&O x 3. Detailed Urogynecologic Evaluation:  Pelvic Exam: Normal external female genitalia; Bartholin's and Skene's glands normal in appearance; urethral meatus normal in appearance, no urethral masses or discharge. The pessary was noted to be in place. It was removed and cleaned. Speculum exam revealed no lesions in the vagina and clumps of white discharge. The pessary was replaced. It was comfortable to the patient and fit well.     Assessment/Plan:    Assessment: Cheryl Wood is a 88 y.o. with stress incontinence here for a pessary check. She is doing well.  Plan: She will keep the pessary in place until next visit. She will continue to use E-string.   She will follow-up in 3 months for a pessary check or sooner as needed.   Of note, patient is moving to another apartment on her birthday.    All questions were answered.

## 2024-05-01 NOTE — Progress Notes (Signed)
 Submitted PA for Gemtesa  on Cover my Meds. Key: [id number] -  PA Case ID: [id number]  Rx #: [id number]   Received instant approval: Status: Approved  Prior Auth: Coverage Start Date:05/01/2024 - Coverage End Date:05/01/2026

## 2024-05-23 ENCOUNTER — Other Ambulatory Visit: Payer: Self-pay | Admitting: Physician Assistant

## 2024-06-01 DIAGNOSIS — M0600 Rheumatoid arthritis without rheumatoid factor, unspecified site: Secondary | ICD-10-CM | POA: Diagnosis not present

## 2024-06-01 DIAGNOSIS — Z79899 Other long term (current) drug therapy: Secondary | ICD-10-CM | POA: Diagnosis not present

## 2024-06-28 ENCOUNTER — Ambulatory Visit: Admitting: Physician Assistant

## 2024-06-28 ENCOUNTER — Encounter: Payer: Self-pay | Admitting: Physician Assistant

## 2024-06-28 VITALS — BP 120/70 | HR 58 | Temp 97.0°F | Ht 61.0 in | Wt 150.8 lb

## 2024-06-28 DIAGNOSIS — G629 Polyneuropathy, unspecified: Secondary | ICD-10-CM | POA: Diagnosis not present

## 2024-06-28 DIAGNOSIS — E1165 Type 2 diabetes mellitus with hyperglycemia: Secondary | ICD-10-CM

## 2024-06-28 DIAGNOSIS — R5383 Other fatigue: Secondary | ICD-10-CM

## 2024-06-28 DIAGNOSIS — G2581 Restless legs syndrome: Secondary | ICD-10-CM

## 2024-06-28 DIAGNOSIS — Z79899 Other long term (current) drug therapy: Secondary | ICD-10-CM | POA: Insufficient documentation

## 2024-06-28 DIAGNOSIS — M1991 Primary osteoarthritis, unspecified site: Secondary | ICD-10-CM | POA: Insufficient documentation

## 2024-06-28 DIAGNOSIS — N1831 Chronic kidney disease, stage 3a: Secondary | ICD-10-CM | POA: Diagnosis not present

## 2024-06-28 DIAGNOSIS — M8580 Other specified disorders of bone density and structure, unspecified site: Secondary | ICD-10-CM | POA: Diagnosis not present

## 2024-06-28 DIAGNOSIS — S52592S Other fractures of lower end of left radius, sequela: Secondary | ICD-10-CM

## 2024-06-28 DIAGNOSIS — M5430 Sciatica, unspecified side: Secondary | ICD-10-CM | POA: Insufficient documentation

## 2024-06-28 DIAGNOSIS — M5441 Lumbago with sciatica, right side: Secondary | ICD-10-CM | POA: Insufficient documentation

## 2024-06-28 LAB — CBC WITH DIFFERENTIAL/PLATELET
Basophils Absolute: 0 10*3/uL (ref 0.0–0.1)
Basophils Relative: 0.3 % (ref 0.0–3.0)
Eosinophils Absolute: 0.1 10*3/uL (ref 0.0–0.7)
Eosinophils Relative: 2.4 % (ref 0.0–5.0)
HCT: 40.7 % (ref 36.0–46.0)
Hemoglobin: 13.4 g/dL (ref 12.0–15.0)
Lymphocytes Relative: 42.5 % (ref 12.0–46.0)
Lymphs Abs: 2.5 10*3/uL (ref 0.7–4.0)
MCHC: 33 g/dL (ref 30.0–36.0)
MCV: 93.7 fl (ref 78.0–100.0)
Monocytes Absolute: 0.6 10*3/uL (ref 0.1–1.0)
Monocytes Relative: 10.2 % (ref 3.0–12.0)
Neutro Abs: 2.6 10*3/uL (ref 1.4–7.7)
Neutrophils Relative %: 44.6 % (ref 43.0–77.0)
Platelets: 174 10*3/uL (ref 150.0–400.0)
RBC: 4.34 Mil/uL (ref 3.87–5.11)
RDW: 13.5 % (ref 11.5–15.5)
WBC: 5.9 10*3/uL (ref 4.0–10.5)

## 2024-06-28 LAB — COMPREHENSIVE METABOLIC PANEL WITH GFR
ALT: 11 U/L (ref 0–35)
AST: 16 U/L (ref 0–37)
Albumin: 4.4 g/dL (ref 3.5–5.2)
Alkaline Phosphatase: 61 U/L (ref 39–117)
BUN: 27 mg/dL — ABNORMAL HIGH (ref 6–23)
CO2: 29 meq/L (ref 19–32)
Calcium: 9.8 mg/dL (ref 8.4–10.5)
Chloride: 106 meq/L (ref 96–112)
Creatinine, Ser: 1.13 mg/dL (ref 0.40–1.20)
GFR: 44.16 mL/min — ABNORMAL LOW (ref >60.00)
Glucose, Bld: 121 mg/dL — ABNORMAL HIGH (ref 70–99)
Potassium: 4.1 meq/L (ref 3.5–5.1)
Sodium: 143 meq/L (ref 135–145)
Total Bilirubin: 1.2 mg/dL (ref 0.2–1.2)
Total Protein: 6.9 g/dL (ref 6.0–8.3)

## 2024-06-28 LAB — HEMOGLOBIN A1C: Hgb A1c MFr Bld: 6.8 % — ABNORMAL HIGH (ref 4.6–6.5)

## 2024-06-28 LAB — LIPID PANEL
Cholesterol: 179 mg/dL (ref 0–200)
HDL: 49.6 mg/dL (ref >39.00)
LDL Cholesterol: 107 mg/dL — ABNORMAL HIGH (ref 0–99)
NonHDL: 129.4
Total CHOL/HDL Ratio: 4
Triglycerides: 114 mg/dL (ref 0.0–149.0)
VLDL: 22.8 mg/dL (ref 0.0–40.0)

## 2024-06-28 MED ORDER — LOVASTATIN 40 MG PO TABS: 40.0000 mg | ORAL_TABLET | Freq: Every day | ORAL | 3 refills | Status: AC

## 2024-06-28 MED ORDER — DAPAGLIFLOZIN PROPANEDIOL 5 MG PO TABS: 5.0000 mg | ORAL_TABLET | Freq: Every day | ORAL | 3 refills | Status: AC

## 2024-06-28 MED ORDER — SERTRALINE HCL 25 MG PO TABS: 25.0000 mg | ORAL_TABLET | Freq: Every day | ORAL | 3 refills | Status: AC

## 2024-06-28 MED ORDER — PRAMIPEXOLE DIHYDROCHLORIDE 0.5 MG PO TABS: 0.5000 mg | ORAL_TABLET | Freq: Every day | ORAL | 1 refills | Status: DC

## 2024-06-28 MED ORDER — POTASSIUM CHLORIDE CRYS ER 20 MEQ PO TBCR: 20.0000 meq | EXTENDED_RELEASE_TABLET | Freq: Every day | ORAL | 3 refills | Status: AC

## 2024-06-28 MED ORDER — FUROSEMIDE 80 MG PO TABS: 160.0000 mg | ORAL_TABLET | Freq: Every day | ORAL | 3 refills | Status: AC

## 2024-06-28 MED ORDER — CLOPIDOGREL BISULFATE 75 MG PO TABS: 75.0000 mg | ORAL_TABLET | Freq: Every day | ORAL | 3 refills | Status: AC

## 2024-06-28 NOTE — Progress Notes (Signed)
 Patient ID: Cheryl Wood, female    DOB: 01/21/1936, 88 y.o.   MRN: [id number]   Assessment & Plan:  Type 2 diabetes mellitus with hyperglycemia, without long-term current use of insulin  (HCC) -     CBC with Differential/Platelet -     Comprehensive metabolic panel with GFR -     Lipid panel -     Hemoglobin A1c  RLS (restless legs syndrome)  Other fatigue -     CBC with Differential/Platelet -     Comprehensive metabolic panel with GFR  Neuropathy  Chronic kidney disease, stage 3a (HCC) -     Comprehensive metabolic panel with GFR  Osteopenia, unspecified location -     Amb Referral to Osteoporosis Management   Other closed fracture of distal end of left radius, sequela -     Amb Referral to Osteoporosis Management   Other orders -     Clopidogrel  Bisulfate; Take 1 tablet (75 mg total) by mouth daily.  Dispense: 90 tablet; Refill: 3 -     Dapagliflozin  Propanediol; Take 1 tablet (5 mg total) by mouth daily before breakfast.  Dispense: 90 tablet; Refill: 3 -     Furosemide ; Take 2 tablets (160 mg total) by mouth daily.  Dispense: 180 tablet; Refill: 3 -     Potassium Chloride  Crys ER; Take 1 tablet (20 mEq total) by mouth daily.  Dispense: 90 tablet; Refill: 3 -     Lovastatin ; Take 1 tablet (40 mg total) by mouth at bedtime.  Dispense: 90 tablet; Refill: 3 -     Pramipexole  Dihydrochloride; Take 1 tablet (0.5 mg total) by mouth at bedtime.  Dispense: 90 tablet; Refill: 1 -     Sertraline  HCl; Take 1 tablet (25 mg total) by mouth daily.  Dispense: 90 tablet; Refill: 3      Assessment & Plan Daytime Sleepiness Daytime sleepiness potentially related to pramipexole  used for restless leg syndrome. She experiences sleepiness when sitting quietly but does not report fatigue. No other medications likely contributing. Consideration of sleep apnea as a differential diagnosis, but she does not report snoring or waking with a dry mouth. She is not interested in a sleep study at  this time, as she is not driving or handling heavy equipment. - Consider adjusting pramipexole  dosage if sleepiness persists  Restless Leg Syndrome Restless leg syndrome managed with pramipexole  0.5 mg. She reports improvement in symptoms with the increased dose, experiencing less thrashing and waking at night. Alternatives such as pregabalin or gabapentin were discussed, but these may also cause tiredness. - Continue pramipexole  0.5 mg  Rheumatoid Arthritis Rheumatoid arthritis managed with infusions, resulting in significant symptom improvement and no recent flare-ups. - Continue current infusion therapy  Iritis Chronic iritis associated with rheumatoid arthritis, previously causing severe pain and requiring frequent eye drops. Currently managed with infusions and eye drops, resulting in no flare-ups since starting infusions. - Continue current infusion therapy and eye drops  Osteopenia Osteopenia with a recent wrist fracture. Bone density test shows she is 0.2 away from being considered osteoporotic. She has a 22% risk of a major fracture in the next ten years. Discussion of potential treatments for osteoporosis, including medications and infusions, and the risks and benefits of these treatments. She is advised against walking dogs due to fall risk. The potential for a fragility clinic referral was discussed to explore treatment options further. - Refer to osteoporosis clinic for further evaluation and management - Advise against walking dogs to prevent  falls - Encourage walking with support if needed  T2DM Stable, takes Farxiga  5 mg Lab Results  Component Value Date   HGBA1C 6.4 (A) 03/23/2024   HGBA1C 6.7 (H) 11/04/2023   HGBA1C 6.8 (H) 06/23/2023     General Health Maintenance Use of a multivitamin with iron and B12 supplementation. - Continue multivitamin with iron and B12 supplementation  Follow-up Routine follow-up planned to monitor ongoing conditions and treatment  efficacy. - Schedule follow-up appointment in 3-4 months - Perform lab tests to check cholesterol and other parameters      No follow-ups on file.    Subjective:    Chief Complaint  Patient presents with   Diabetes    Pt in office for diabetes follow up with PCP; pt last A1C below 8 and not due until Sept; pt admits increased does of Mirapex  is helping and doing better; pt states having issues staying awake finds herself falling asleep anytime she sits still for a period of time.    HPI Discussed the use of AI scribe software for clinical note transcription with the patient, who gave verbal consent to proceed.  History of Present Illness Cheryl Wood is an 88 year old female with restless leg syndrome who presents with excessive daytime sleepiness. She is accompanied by her daughter.  She experiences excessive daytime sleepiness, particularly when sitting quietly, which she attributes to her medication, Mirapex , used for restless leg syndrome. She does not feel tired, just sleepy, and has not experienced this issue during church services. Her Mirapex  dosage was recently increased from 0.25 mg to 0.5 mg, and she has not tried other medications for restless leg syndrome. She takes Tylenol  for pain and denies taking medications like Lyrica or gabapentin.  She has a history of restless leg syndrome, which causes her legs to 'wake up' at night, though it does not cause pain. The condition disrupts her sleep. She has been on Mirapex  since it was prescribed and has not tried other treatments.  She recently fractured her arm due to a fall caused by her dog pulling her down. Her wrist did not heal well, causing her watches to not fit, and she experiences pain in the bone at times. She has a history of another fall where she missed a step and fell down stairs, resulting in a cut over her eye and broken glasses.  She has osteopenia, with a bone density test in October showing she is 0.2 away  from being considered osteoporotic. She takes a women's multivitamin with iron and B12 but does not take specific osteoporosis medications.  No symptoms of sleep apnea, such as excessive snoring or waking up during the night, and her sister has not reported hearing her snore. She does not wake up with a dry mouth.  Her blood sugar levels were recently checked, showing readings of 102 and 105, indicating good control. She takes a potassium supplement due to her use of Lasix .  She has a history of iritis associated with rheumatoid arthritis, which previously caused flare-ups every two to three months, requiring multiple eye drops and frequent follow-ups. Since starting infusions, she has not experienced any flare-ups. She continues to use three eye drops and reports significant improvement in her symptoms.  She lives in a smaller, cozy place and enjoys the company of her dogs.     Past Medical History:  Diagnosis Date   Allergy    Arthritis    Bleeding of blood vessel    ext. genitalia area  Blood in stool    Broken wrist, left, with routine healing, subsequent encounter 2025   pt stated declined surgery   Chronic kidney disease    Depression    Glaucoma    Heart murmur    History of chicken pox    History of recurrent UTIs    History of stomach ulcers    Hyperlipidemia    Hypertension    Memory loss    Rheumatic fever    Seizures (HCC)    Stroke (HCC)    2007; embolic   TIA (transient ischemic attack) 10/22/2013   Urine incontinence     Past Surgical History:  Procedure Laterality Date   ABDOMINAL HYSTERECTOMY     APPENDECTOMY  1983   CATARACT EXTRACTION Right May 13, 2014   EYE SURGERY  12/13/2018   stent inserted in left eye   gum transplant     RIGHT/LEFT HEART CATH AND CORONARY ANGIOGRAPHY N/A 05/12/2018   Procedure: RIGHT/LEFT HEART CATH AND CORONARY ANGIOGRAPHY;  Surgeon: Claudene Victory ORN, MD;  Location: [facility] INVASIVE CV LAB;  Service: Cardiovascular;  Laterality:  N/A;   THORACIC AORTOGRAM N/A 05/12/2018   Procedure: THORACIC AORTOGRAM;  Surgeon: Claudene Victory ORN, MD;  Location: [facility] INVASIVE CV LAB;  Service: Cardiovascular;  Laterality: N/A;   TONSILLECTOMY      Family History  Problem Relation Age of Onset   Cancer Mother    Early death Mother    Heart disease Father    Arthritis Father    Early death Father    Hearing loss Father    Hypertension Father    Hyperlipidemia Father    Cancer Sister    Arthritis Sister    Arthritis Brother    Diabetes Brother    Heart attack Brother    Heart disease Brother    Hyperlipidemia Brother    Hypertension Brother    Diabetes Daughter    Heart disease Daughter    Hyperlipidemia Daughter    Hyperlipidemia Maternal Grandmother    Hypertension Maternal Grandmother    Stroke Maternal Grandmother    Alcohol  abuse Maternal Grandfather    Early death Maternal Grandfather        tuberculosis   Arthritis Paternal Grandmother    Alcohol  abuse Paternal Grandfather    Heart disease Paternal Grandfather    Hyperlipidemia Paternal Grandfather    Hypertension Paternal Grandfather    Stroke Paternal Grandfather    Alcohol  abuse Sister    COPD Sister    Drug abuse Sister    Early death Sister    Heart disease Sister    Hypertension Sister    Hyperlipidemia Sister    Early death Brother    Early death Brother     Social History   Tobacco Use   Smoking status: Former    Current packs/day: 0.00    Types: Cigarettes    Start date: 11/04/1941    Quit date: 11/05/1971    Years since quitting: 52.6   Smokeless tobacco: Never  Vaping Use   Vaping status: Never Used  Substance Use Topics   Alcohol  use: No    Alcohol /week: 0.0 standard drinks of alcohol    Drug use: No     Allergies  Allergen Reactions   Dorzolamide  Hcl-Timolol  Mal Other (See Comments)    Red eyes   Tetanus Toxoids Swelling    Arm was twice the size it should be   Hctz [Hydrochlorothiazide] Rash    Review of Systems NEGATIVE  UNLESS OTHERWISE INDICATED  IN HPI      Objective:     BP 120/70 (BP Location: Left Arm, Patient Position: Sitting, Cuff Size: Normal)   Pulse (!) 58   Temp (!) 97 F (36.1 C) (Temporal)   Ht 5' 1 (1.549 m)   Wt 150 lb 12.8 oz (68.4 kg)   LMP  (LMP Unknown)   SpO2 97%   BMI 28.49 kg/m   Wt Readings from Last 3 Encounters:  06/28/24 150 lb 12.8 oz (68.4 kg)  04/10/24 149 lb (67.6 kg)  03/23/24 151 lb 9.6 oz (68.8 kg)    BP Readings from Last 3 Encounters:  06/28/24 120/70  04/12/24 112/68  03/23/24 100/66     Physical Exam Vitals and nursing note reviewed.  Constitutional:      Appearance: Normal appearance.  HENT:     Head: Normocephalic.     Mouth/Throat:     Mouth: Mucous membranes are moist.  Eyes:     Extraocular Movements: Extraocular movements intact.     Conjunctiva/sclera: Conjunctivae normal.     Pupils: Pupils are equal, round, and reactive to light.  Cardiovascular:     Rate and Rhythm: Normal rate and regular rhythm.     Pulses: Normal pulses.     Heart sounds: No murmur heard. Pulmonary:     Effort: Pulmonary effort is normal.     Breath sounds: Normal breath sounds. No wheezing or rales.  Musculoskeletal:     Right lower leg: No edema.     Left lower leg: No edema.  Neurological:     General: No focal deficit present.     Mental Status: She is alert and oriented to person, place, and time.     Cranial Nerves: No cranial nerve deficit.     Sensory: No sensory deficit (neurovasc normal in fingers / toes / feet / hands).     Motor: No weakness.     Coordination: Coordination normal.     Gait: Gait normal.  Psychiatric:        Mood and Affect: Mood normal.        Behavior: Behavior normal.        Thought Content: Thought content normal.        Judgment: Judgment normal.         Time Spent: 47 minutes of total time was spent on the date of the encounter performing the following actions: chart review prior to seeing the patient, obtaining  history, performing a medically necessary exam, counseling on the treatment plan, placing orders, and documenting in our EHR.       Aariah Godette M Jamariyah Johannsen, PA-C

## 2024-06-28 NOTE — Patient Instructions (Signed)
  VISIT SUMMARY: Today, we discussed your excessive daytime sleepiness, which may be related to your medication for restless leg syndrome. We also reviewed your management plan for restless leg syndrome, rheumatoid arthritis, iritis, and osteopenia. Additionally, we talked about general health maintenance and scheduled a follow-up appointment.  YOUR PLAN: DAYTIME SLEEPINESS: You are experiencing sleepiness during the day, which may be related to your medication for restless leg syndrome. -Consider adjusting your pramipexole  dosage if the sleepiness continues.  RESTLESS LEG SYNDROME: Your restless leg syndrome is currently managed with pramipexole , and you have noticed improvement with the increased dose. -Continue taking pramipexole  0.5 mg.  RHEUMATOID ARTHRITIS: Your rheumatoid arthritis is well-managed with your current infusion therapy, and you have not had any recent flare-ups. -Continue your current infusion therapy.  IRITIS: Your iritis, associated with rheumatoid arthritis, is under control with your current treatment plan. -Continue your current infusion therapy and eye drops.  OSTEOPENIA: You have osteopenia and recently had a wrist fracture. Your bone density test shows you are close to being considered osteoporotic. -Refer to an osteoporosis clinic for further evaluation and management. -Avoid walking your dogs to prevent falls. -Consider walking with support if needed.  GENERAL HEALTH MAINTENANCE: You are taking a multivitamin with iron and B12. -Continue taking your multivitamin with iron and B12.  FOLLOW-UP: Routine follow-up to monitor your conditions and treatment efficacy. -Schedule a follow-up appointment in 3-4 months. -Perform lab tests to check cholesterol and other parameters.                      Contains text generated by [employer]bridge.                                 Contains text generated by [employer]bridge.

## 2024-06-29 ENCOUNTER — Ambulatory Visit: Payer: Self-pay | Admitting: Physician Assistant

## 2024-07-12 ENCOUNTER — Encounter: Payer: Self-pay | Admitting: Obstetrics and Gynecology

## 2024-07-12 ENCOUNTER — Ambulatory Visit (INDEPENDENT_AMBULATORY_CARE_PROVIDER_SITE_OTHER): Admitting: Obstetrics and Gynecology

## 2024-07-12 VITALS — BP 97/65 | HR 61

## 2024-07-12 DIAGNOSIS — N952 Postmenopausal atrophic vaginitis: Secondary | ICD-10-CM

## 2024-07-12 DIAGNOSIS — N3281 Overactive bladder: Secondary | ICD-10-CM | POA: Diagnosis not present

## 2024-07-12 DIAGNOSIS — N393 Stress incontinence (female) (male): Secondary | ICD-10-CM | POA: Diagnosis not present

## 2024-07-12 MED ORDER — ESTRADIOL 0.1 MG/GM VA CREA: 0.5000 g | TOPICAL_CREAM | VAGINAL | 11 refills | Status: DC

## 2024-07-12 NOTE — Progress Notes (Signed)
 [facility]   Subjective:     Chief Complaint:  Chief Complaint  Patient presents with   Pessary Check    Cheryl Wood is a 88 y.o. female is here for pessary check/cleaning.   History of Present Illness: Cheryl Wood is a 88 y.o. female with stress incontinence and OAB who presents for a pessary check. She is using a size #0 incontinence ring pessary. The pessary has been working well and she has no complaints. She is using vaginal estrogen in the form of an E-string. She reports some minimal vaginal bleeding.  Past Medical History: Patient  has a past medical history of Allergy, Arthritis, Bleeding of blood vessel, Blood in stool, Broken wrist, left, with routine healing, subsequent encounter (2025), Chronic kidney disease, Depression, Glaucoma, Heart murmur, History of chicken pox, History of recurrent UTIs, History of stomach ulcers, Hyperlipidemia, Hypertension, Memory loss, Rheumatic fever, Seizures (HCC), Stroke (HCC), TIA (transient ischemic attack) (10/22/2013), and Urine incontinence.   Past Surgical History: She  has a past surgical history that includes Abdominal hysterectomy; gum transplant; Cataract extraction (Right, May 13, 2014); RIGHT/LEFT HEART CATH AND CORONARY ANGIOGRAPHY (N/A, 05/12/2018); THORACIC AORTOGRAM (N/A, 05/12/2018); Eye surgery (12/13/2018); Appendectomy (1983); and Tonsillectomy.   Medications: She has a current medication list which includes the following prescription(s): acetaminophen , b complex vitamins, brimonidine, clopidogrel , cyanocobalamin , dapagliflozin  propanediol, [START ON 07/13/2024] estradiol , estring , famciclovir, famotidine , furosemide , gemtesa , contour next test, simponi  aria, lancets, loratadine , losartan , lovastatin , multiple vitamins-minerals, NON FORMULARY, potassium chloride  sa, pramipexole , prednisolone  acetate, sertraline , timolol , and timolol .   Allergies: Patient is allergic to dorzolamide  hcl-timolol  mal, tetanus  toxoids, and hctz [hydrochlorothiazide].   Social History: Patient  reports that she quit smoking about 52 years ago. Her smoking use included cigarettes. She started smoking about 82 years ago. She has never used smokeless tobacco. She reports that she does not drink alcohol  and does not use drugs.      Objective:    Physical Exam: BP 97/65   Pulse 61   LMP  (LMP Unknown)  Gen: No apparent distress, A&O x 3. Detailed Urogynecologic Evaluation:  Pelvic Exam: Normal external female genitalia; Bartholin's and Skene's glands normal in appearance; urethral meatus normal in appearance, no urethral masses or discharge. The pessary was noted to be in place. It was removed and cleaned. Speculum exam revealed erythema in the vagina. The pessary was replaced with a new #0 pessary with knob (Lot [id number]) as the current pessary has become frayed and may be irritating the tissues.       Assessment/Plan:    Assessment: Cheryl Wood is a 88 y.o. with stress incontinence and OAB here for a pessary check. She is doing well.  Plan: She will keep the pessary in place until next visit. She will continue to use estrogen in the form of an E-string. She will follow-up in 3 months for a pessary check or sooner as needed.

## 2024-07-12 NOTE — Patient Instructions (Addendum)
 Use the estrogen cream right at the opening for the next few days where you felt the burning.

## 2024-07-26 ENCOUNTER — Encounter: Payer: Self-pay | Admitting: Physician Assistant

## 2024-07-26 ENCOUNTER — Ambulatory Visit (INDEPENDENT_AMBULATORY_CARE_PROVIDER_SITE_OTHER): Admitting: Physician Assistant

## 2024-07-26 VITALS — Ht 62.0 in | Wt 151.2 lb

## 2024-07-26 DIAGNOSIS — M810 Age-related osteoporosis without current pathological fracture: Secondary | ICD-10-CM | POA: Diagnosis not present

## 2024-07-26 LAB — VITAMIN D 25 HYDROXY (VIT D DEFICIENCY, FRACTURES): Vit D, 25-Hydroxy: 46 ng/mL (ref 30–100)

## 2024-07-26 NOTE — Progress Notes (Signed)
 Office Visit Note   Patient: Cheryl Wood           Date of Birth: [birth year]           MRN: [id number] Visit Date: 07/26/2024              Requested by: Cheryl Wood HERO, PA-C [address] [place],  [place] [address] PCP: Cheryl Cheryl HERO, PA-C   Assessment & Plan: Visit Diagnoses:  1. Age-related osteoporosis without current pathological fracture     Plan: Patient is a pleasant 88 year old woman who is accompanied by her daughter.  Referred for evaluation of osteoporosis treatment.  She is currently not taking any osteoporosis medications or not in the past.  She does have a history of a distal radius fracture about a year ago.  She does have a history of a CVA back in 2007.  No history of cancer.  She does have a lower kidney function with a GFR of 44.  She is also had a history of ulcers.  She does not have reflux or has had bypass gastric surgery.  No history of epilepsy or seizures.  She underwent menopause with hysterectomy in the 1980s in her 41s she did take hormone replacement therapy for quite a few years until she had her stroke.  She does not take any particular calcium or vitamin D  except what may be in her multivitamin.  She is a former smoker.  She does not consume alcoholic beverages.  She does not do any exercises she has no history of major dental work and does not have any family history of fragility fractures.  I did also add that she is a has rheumatoid arthritis for which she receives infusion.  Her FRAX score is a 19% chance of a major osteoporotic fracture in the next 10 years and a 8.7% chance of a hip fracture.  Her bone density scores a -2.3 at femoral neck.  She is osteoporotic based on these calculations.  I had a 45 minutes reviewing her charts her labs and discussing various treatments and lifestyles.  She is going to look into Silver sneakers and I will draw vitamin D  on her today.  Will let her know the results.  She is unsure whether she wants to go  forward with any treatment.  I think her most logical choice would be Prolia.  She is not a good candidate for Reclast or Fosamax given her kidney function.  And I do not think she needs an anabolic medication at this time.  She is going to take this along to discussion with her family and her other care providers and let me know  Follow-Up Instructions: No follow-ups on file.   Orders:  Orders Placed This Encounter  Procedures   Vitamin D  (25 hydroxy)   No orders of the defined types were placed in this encounter.     Procedures: No procedures performed   Clinical Data: No additional findings.   Subjective: No chief complaint on file.   HPI  Review of Systems  All other systems reviewed and are negative.    Objective: Vital Signs: Ht 5' 2 (1.575 m)   Wt 151 lb 3.2 oz (68.6 kg)   LMP  (LMP Unknown)   BMI 27.65 kg/m   Physical Exam Constitutional:      Appearance: Normal appearance.  Pulmonary:     Effort: Pulmonary effort is normal.  Skin:    General: Skin is warm and dry.  Neurological:     Mental Status: She is alert.  Psychiatric:        Mood and Affect: Mood normal.        Behavior: Behavior normal.     Ortho Exam Cheryl Wood is a pleasant 88 year old woman who is referred for osteoporosis by Cheryl Allwardt PA-C Specialty Comments:  No specialty comments available.  Imaging: No results found.   PMFS History: Patient Active Problem List   Diagnosis Date Noted   Age-related osteoporosis without current pathological fracture 07/26/2024   Acute right-sided low back pain with right-sided sciatica 06/28/2024   Fatigue 06/28/2024   Long term current use of therapeutic drug 06/28/2024   Neuropathy 06/28/2024   Primary localized osteoarthrosis of multiple sites 06/28/2024   Sciatica 06/28/2024   Osteopenia 04/08/2023   Type 2 diabetes mellitus with hyperglycemia, without long-term current use of insulin  (HCC) 02/22/2023   Chronic kidney disease,  stage 3a (HCC) 02/22/2023   Palpitations 01/12/2023   Bilateral lower extremity edema 10/22/2021   Iritis 08/05/2021   Lumbar radiculopathy 07/15/2021   Seronegative rheumatoid arthritis (HCC) 01/03/2021   Depression, major, single episode, mild (HCC) 09/29/2019   Polyarthralgia 04/21/2019   Controlled type 2 diabetes mellitus without complication, without long-term current use of insulin  (HCC) 01/27/2019   CVA (cerebral vascular accident) (HCC) 01/25/2019   RLS (restless legs syndrome) 01/25/2019   Glaucoma, left eye 01/25/2019   Nonrheumatic aortic valve insufficiency 12/09/2018   CAD in native artery 12/09/2018   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 genitalia area   Blood in stool    Broken wrist, left, with routine healing, subsequent encounter 2025   pt stated declined surgery   Chronic kidney disease    Depression    Glaucoma    Heart murmur    History of chicken pox    History of recurrent UTIs    History of stomach ulcers    Hyperlipidemia    Hypertension    Memory loss    Rheumatic fever    Seizures (HCC)    Stroke (HCC)    2007; embolic   TIA (transient ischemic attack) 10/22/2013   Urine incontinence     Family History  Problem Relation Age of Onset   Cancer Mother    Early death Mother    Heart disease Father    Arthritis Father    Early death Father    Hearing loss Father    Hypertension Father    Hyperlipidemia Father    Cancer Sister    Arthritis Sister    Arthritis  Brother    Diabetes Brother    Heart attack Brother    Heart disease Brother    Hyperlipidemia Brother    Hypertension Brother    Diabetes Daughter    Heart disease Daughter    Hyperlipidemia Daughter    Hyperlipidemia Maternal Grandmother    Hypertension Maternal Grandmother    Stroke Maternal Grandmother    Alcohol  abuse Maternal Grandfather    Early death  Maternal Grandfather        tuberculosis   Arthritis Paternal Grandmother    Alcohol  abuse Paternal Grandfather    Heart disease Paternal Grandfather    Hyperlipidemia Paternal Grandfather    Hypertension Paternal Grandfather    Stroke Paternal Grandfather    Alcohol  abuse Sister    COPD Sister    Drug abuse Sister    Early death Sister    Heart disease Sister    Hypertension Sister    Hyperlipidemia Sister    Early death Brother    Early death Brother     Past Surgical History:  Procedure Laterality Date   ABDOMINAL HYSTERECTOMY     APPENDECTOMY  1983   CATARACT EXTRACTION Right May 13, 2014   EYE SURGERY  12/13/2018   stent inserted in left eye   gum transplant     RIGHT/LEFT HEART CATH AND CORONARY ANGIOGRAPHY N/A 05/12/2018   Procedure: RIGHT/LEFT HEART CATH AND CORONARY ANGIOGRAPHY;  Surgeon: Claudene Victory ORN, MD;  Location: [facility] INVASIVE CV LAB;  Service: Cardiovascular;  Laterality: N/A;   THORACIC AORTOGRAM N/A 05/12/2018   Procedure: THORACIC AORTOGRAM;  Surgeon: Claudene Victory ORN, MD;  Location: [facility] INVASIVE CV LAB;  Service: Cardiovascular;  Laterality: N/A;   TONSILLECTOMY     Social History   Occupational History    Employer: RETIRED  Tobacco Use   Smoking status: Former    Current packs/day: 0.00    Types: Cigarettes    Start date: 11/04/1941    Quit date: 11/05/1971    Years since quitting: 52.7   Smokeless tobacco: Never  Vaping Use   Vaping status: Never Used  Substance and Sexual Activity   Alcohol  use: No    Alcohol /week: 0.0 standard drinks of alcohol    Drug use: No   Sexual activity: Not  Currently

## 2024-07-31 DIAGNOSIS — M0600 Rheumatoid arthritis without rheumatoid factor, unspecified site: Secondary | ICD-10-CM | POA: Diagnosis not present

## 2024-07-31 DIAGNOSIS — R5383 Other fatigue: Secondary | ICD-10-CM | POA: Diagnosis not present

## 2024-07-31 DIAGNOSIS — Z79899 Other long term (current) drug therapy: Secondary | ICD-10-CM | POA: Diagnosis not present

## 2024-08-10 DIAGNOSIS — Z79899 Other long term (current) drug therapy: Secondary | ICD-10-CM | POA: Diagnosis not present

## 2024-08-10 DIAGNOSIS — E663 Overweight: Secondary | ICD-10-CM | POA: Diagnosis not present

## 2024-08-10 DIAGNOSIS — M0600 Rheumatoid arthritis without rheumatoid factor, unspecified site: Secondary | ICD-10-CM | POA: Diagnosis not present

## 2024-08-10 DIAGNOSIS — H209 Unspecified iridocyclitis: Secondary | ICD-10-CM | POA: Diagnosis not present

## 2024-08-10 DIAGNOSIS — M8589 Other specified disorders of bone density and structure, multiple sites: Secondary | ICD-10-CM | POA: Diagnosis not present

## 2024-08-10 DIAGNOSIS — Z6827 Body mass index (BMI) 27.0-27.9, adult: Secondary | ICD-10-CM | POA: Diagnosis not present

## 2024-08-10 DIAGNOSIS — M1991 Primary osteoarthritis, unspecified site: Secondary | ICD-10-CM | POA: Diagnosis not present

## 2024-08-14 ENCOUNTER — Telehealth: Payer: Self-pay

## 2024-08-14 NOTE — Telephone Encounter (Signed)
 Copied from CRM [id number]. Topic: Clinical - Lab/Test Results >> Aug 11, 2024  9:39 AM Viola F wrote: Reason for CRM: Patient would like to discuss lab results from [facility] 07/26/24. Please call her at [phone] (M) >> Aug 14, 2024 10:16 AM Willma SAUNDERS wrote: Patient calling in again in regards to her labs. Would like to discuss what her blood count was and vitamin d   Please see pt msg, pt wanting to discuss recent Vitamin D  labs with PCP; please advise

## 2024-08-15 ENCOUNTER — Telehealth: Payer: Self-pay

## 2024-08-15 NOTE — Telephone Encounter (Signed)
 Patient called stating that she has spoken with her PCP about her Vitamin D (our office drew blood when she was here). She wants to start taking Prolia and her PCP told her that our office should contact her insurance company to find out if they will cover the medicine and pay for it. She has [employer] and [employer].  She wants a return call to let her know the status of this information. [phone].

## 2024-08-15 NOTE — Telephone Encounter (Signed)
 Pt was advised that she can start Prolia but needed Vitamin D  levels checked. Pt wanted to let PCP know she is wanting to proceed with Prolia injections. She is going to let the Osteo clinic know she wants to move forward but would prefer to get the injections in our office since closer to her home and more convenient.

## 2024-08-15 NOTE — Telephone Encounter (Signed)
 Noted and agreed, thank you.

## 2024-08-23 ENCOUNTER — Telehealth: Payer: Self-pay | Admitting: Physician Assistant

## 2024-08-23 ENCOUNTER — Telehealth: Payer: Self-pay

## 2024-08-23 DIAGNOSIS — H4042X2 Glaucoma secondary to eye inflammation, left eye, moderate stage: Secondary | ICD-10-CM | POA: Diagnosis not present

## 2024-08-23 DIAGNOSIS — H4041X1 Glaucoma secondary to eye inflammation, right eye, mild stage: Secondary | ICD-10-CM | POA: Diagnosis not present

## 2024-08-23 NOTE — Telephone Encounter (Signed)
 Called patient and the phone rang and then stopped  I tried 3 times and same result

## 2024-08-23 NOTE — Telephone Encounter (Signed)
 Pt called stating she seen Cheryl Wood and spoke about prolia injection and asking to submit to insurance company. Pt asked for a call back from Tori about how long the process takes to get injection approved. at [phone].

## 2024-08-29 DIAGNOSIS — H35033 Hypertensive retinopathy, bilateral: Secondary | ICD-10-CM | POA: Diagnosis not present

## 2024-08-29 DIAGNOSIS — E119 Type 2 diabetes mellitus without complications: Secondary | ICD-10-CM | POA: Diagnosis not present

## 2024-08-29 DIAGNOSIS — Z961 Presence of intraocular lens: Secondary | ICD-10-CM | POA: Diagnosis not present

## 2024-08-29 DIAGNOSIS — H4041X1 Glaucoma secondary to eye inflammation, right eye, mild stage: Secondary | ICD-10-CM | POA: Diagnosis not present

## 2024-08-29 DIAGNOSIS — H4042X2 Glaucoma secondary to eye inflammation, left eye, moderate stage: Secondary | ICD-10-CM | POA: Diagnosis not present

## 2024-08-29 DIAGNOSIS — H209 Unspecified iridocyclitis: Secondary | ICD-10-CM | POA: Diagnosis not present

## 2024-09-12 ENCOUNTER — Telehealth: Payer: Self-pay

## 2024-09-22 ENCOUNTER — Telehealth: Payer: Self-pay | Admitting: Physician Assistant

## 2024-09-22 NOTE — Telephone Encounter (Signed)
 Patient called and said that she wants to know if she got approved or not. CB#[phone]

## 2024-09-25 ENCOUNTER — Other Ambulatory Visit: Payer: Self-pay

## 2024-09-25 ENCOUNTER — Other Ambulatory Visit: Payer: Self-pay | Admitting: Physician Assistant

## 2024-09-25 ENCOUNTER — Ambulatory Visit: Admitting: Physician Assistant

## 2024-09-25 DIAGNOSIS — M810 Age-related osteoporosis without current pathological fracture: Secondary | ICD-10-CM

## 2024-09-25 DIAGNOSIS — N3281 Overactive bladder: Secondary | ICD-10-CM

## 2024-09-25 MED ORDER — GEMTESA 75 MG PO TABS: 1.0000 | ORAL_TABLET | Freq: Every day | ORAL | 11 refills | Status: AC

## 2024-09-25 MED ORDER — DENOSUMAB 60 MG/ML ~~LOC~~ SOSY
60.0000 mg | PREFILLED_SYRINGE | Freq: Once | SUBCUTANEOUS | Status: AC
  Administered 2024-10-16: 60 mg via SUBCUTANEOUS

## 2024-09-28 ENCOUNTER — Ambulatory Visit: Admitting: Physician Assistant

## 2024-09-28 ENCOUNTER — Telehealth: Payer: Self-pay

## 2024-09-28 NOTE — Telephone Encounter (Signed)
I will call her to discuss

## 2024-09-28 NOTE — Telephone Encounter (Signed)
 Per message in chart, Cheryl Wood. Will reach out to patient.

## 2024-09-28 NOTE — Telephone Encounter (Signed)
 FYI-  Patient called and left a VM stating that she received an approval letter for Prolia.  Didn't state whether or not she wanted to proceed with injection.  Thank you.

## 2024-10-05 ENCOUNTER — Other Ambulatory Visit: Payer: Self-pay | Admitting: Physician Assistant

## 2024-10-09 ENCOUNTER — Telehealth: Payer: Self-pay | Admitting: Physician Assistant

## 2024-10-09 NOTE — Telephone Encounter (Signed)
 Pt called wanting to know how much the insurance would pay for her osteoporosis injections. Pt call back number is [phone]

## 2024-10-09 NOTE — Telephone Encounter (Signed)
 Cheryl Wood will address

## 2024-10-10 ENCOUNTER — Ambulatory Visit: Admitting: Physician Assistant

## 2024-10-10 ENCOUNTER — Encounter: Payer: Self-pay | Admitting: Physician Assistant

## 2024-10-10 ENCOUNTER — Telehealth: Payer: Self-pay

## 2024-10-10 VITALS — BP 122/76 | HR 53 | Temp 97.2°F | Ht 62.0 in | Wt 151.0 lb

## 2024-10-10 DIAGNOSIS — M810 Age-related osteoporosis without current pathological fracture: Secondary | ICD-10-CM

## 2024-10-10 DIAGNOSIS — E1165 Type 2 diabetes mellitus with hyperglycemia: Secondary | ICD-10-CM

## 2024-10-10 DIAGNOSIS — Z7984 Long term (current) use of oral hypoglycemic drugs: Secondary | ICD-10-CM

## 2024-10-10 DIAGNOSIS — Z960 Presence of urogenital implants: Secondary | ICD-10-CM | POA: Diagnosis not present

## 2024-10-10 LAB — POCT GLYCOSYLATED HEMOGLOBIN (HGB A1C): Hemoglobin A1C: 6.4 % — AB (ref 4.0–5.6)

## 2024-10-10 NOTE — Patient Instructions (Addendum)
  VISIT SUMMARY: During your visit, we reviewed your diabetes management, addressed your concerns about vaginal bleeding with pessary use, discussed osteoporosis treatment. Your diabetes is well-controlled, and we have plans to follow up on the other issues.  YOUR PLAN: POSTMENOPAUSAL VAGINAL BLEEDING WITH PESSARY USE: You have been experiencing persistent bloody discharge with pessary use, likely due to irritation of the vaginal walls. -Follow up with your urogynecologist for further evaluation and management of the vaginal bleeding and pessary use.  TYPE 2 DIABETES MELLITUS WITHOUT COMPLICATIONS: Your diabetes is well-controlled with an A1c of 6.4%. -Continue taking Farxiga  5 mg daily. -Keep focusing on a diet low in sugar and carbohydrates.  OSTEOPOROSIS WITHOUT CURRENT PATHOLOGICAL FRACTURE: You have osteoporosis and are considering treatment with Prolia. You have not had any falls, but you do experience occasional stumbling. -Call ortho care to schedule your Prolia injection. -Discuss follow-up with Ronal Caldron at ortho care regarding your Prolia treatment.  GENERAL HEALTH MAINTENANCE: Your weight and blood pressure are within normal limits. You have a cardiology follow-up scheduled. -Schedule a follow-up in four months for routine labs and kidney function assessment.                      Contains text generated by Abridge.                                 Contains text generated by Abridge.

## 2024-10-10 NOTE — Progress Notes (Signed)
 Patient ID: Cheryl Wood, female    DOB: May 21, 1936, 88 y.o.   MRN: [id number]   Assessment & Plan:  Type 2 diabetes mellitus with hyperglycemia, without long-term current use of insulin  (HCC) -     POCT glycosylated hemoglobin (Hb A1C)  Presence of pessary  Age-related osteoporosis without current pathological fracture    Assessment & Plan Postmenopausal vaginal bleeding with pessary use Postmenopausal vaginal bleeding associated with pessary use. Persistent bloody discharge may be due to irritation of the vaginal walls. A smaller pessary size has been tried, but symptoms persist. Urogynecologist advised against refilling Estring  until further evaluation. - Follow up with urogynecologist for further evaluation of vaginal bleeding and pessary management.  Type 2 diabetes mellitus without complications Type 2 diabetes mellitus is well-controlled with an A1c of 6.4%, consistent with previous results. Managed with Farxiga  5 mg daily and dietary modifications. - Continue Farxiga  5 mg daily. - Encourage dietary modifications focusing on low sugar and low carbohydrate intake. Lab Results  Component Value Date   HGBA1C 6.4 (A) 10/10/2024   HGBA1C 6.8 (H) 06/28/2024   HGBA1C 6.4 (A) 03/23/2024     Osteoporosis without current pathological fracture Osteoporosis management is under consideration with Prolia. Insurance will cover 100% of the cost. No falls reported, but occasional stumbling and bumping into objects may be related to age-related changes in balance and vision. - Call ortho care to schedule Prolia injection. - Discuss follow-up with Cheryl Wood at ortho care regarding Prolia treatment.  General Health Maintenance Weight and blood pressure are within normal limits. Scheduled for cardiology follow-up. - Schedule follow-up in four months for routine labs and kidney function assessment. - Attend cardiology appointment with Dr. Dorn Wood on October 22 at 3:00  PM.      Return in about 4 months (around 02/10/2025) for recheck/follow-up.    Subjective:    Chief Complaint  Patient presents with   Diabetes    Pt in office for diabetes follow up and A1C check; pt also wants PCP to know received approval for Prolia; pt wanting to have delivered from [facility] pharmacy to receive in office;     HPI Discussed the use of AI scribe software for clinical note transcription with the patient, who gave verbal consent to proceed.  History of Present Illness Cheryl Wood is an 88 year old female with type 2 diabetes who presents for a regular three-month follow-up visit. Here with daughter, Cheryl Wood.   Her point of care A1c today was 6.4%, consistent with her last A1c. She is currently taking Farxiga  5 mg daily for diabetes management. She feels 'really good' and is trying to watch her diet by avoiding sugar and opting for low sugar or low carb alternatives, although she does not write it down.  She reports a persistent bloody discharge associated with the use of a pessary, which she has been using for about three years. The discharge occurs when the pessary is examined and cleaned. No pelvic pain is present. The pessary size was adjusted, but the discharge persists.  She experiences occasional stumbling and bumping into walls or door frames but does not fall. She attributes this to age-related changes and notes issues with hearing and eyesight, which affect her depth perception and ability to understand phone conversations.     Past Medical History:  Diagnosis Date   Allergy    Arthritis    Bleeding of blood vessel    ext. genitalia area   Blood in  stool    Broken wrist, left, with routine healing, subsequent encounter 2025   pt stated declined surgery   Chronic kidney disease    Depression    Glaucoma    Heart murmur    History of chicken pox    History of recurrent UTIs    History of stomach ulcers    Hyperlipidemia    Hypertension     Memory loss    Rheumatic fever    Seizures (HCC)    Stroke (HCC)    2007; embolic   TIA (transient ischemic attack) 10/22/2013   Urine incontinence     Past Surgical History:  Procedure Laterality Date   ABDOMINAL HYSTERECTOMY     APPENDECTOMY  1983   CATARACT EXTRACTION Right May 13, 2014   EYE SURGERY  12/13/2018   stent inserted in left eye   gum transplant     RIGHT/LEFT HEART CATH AND CORONARY ANGIOGRAPHY N/A 05/12/2018   Procedure: RIGHT/LEFT HEART CATH AND CORONARY ANGIOGRAPHY;  Surgeon: Cheryl Wood ORN, MD;  Location: [facility] INVASIVE CV LAB;  Service: Cardiovascular;  Laterality: N/A;   THORACIC AORTOGRAM N/A 05/12/2018   Procedure: THORACIC AORTOGRAM;  Surgeon: Cheryl Wood ORN, MD;  [facility] INVASIVE CV LAB;  Service: Cardiovascular;  Laterality: N/A;   TONSILLECTOMY      Family History  Problem Relation Age of Onset   Cancer Mother 50   Early death Mother    Heart disease Father    Arthritis Father    Early death Father    Hearing loss Father    Hypertension Father    Hyperlipidemia Father    Thyroid  cancer Sister    Arthritis Sister    Alcohol  abuse Sister    COPD Sister    Drug abuse Sister    Early death Sister    Heart disease Sister    Hypertension Sister    Hyperlipidemia Sister    Arthritis Brother    Diabetes Brother    Heart attack Brother    Heart disease Brother    Hyperlipidemia Brother    Hypertension Brother    Early death Brother    Early death Brother    Hyperlipidemia Maternal Grandmother    Hypertension Maternal Grandmother    Stroke Maternal Grandmother    Alcohol  abuse Maternal Grandfather    Early death Maternal Grandfather        tuberculosis   Arthritis Paternal Grandmother    Alcohol  abuse Paternal Grandfather    Heart disease Paternal Grandfather    Hyperlipidemia Paternal Grandfather    Hypertension Paternal Grandfather    Stroke Paternal Grandfather    Diabetes Daughter    Heart disease Daughter    Hyperlipidemia  Daughter     Social History   Tobacco Use   Smoking status: Former    Current packs/day: 0.00    Types: Cigarettes    Start date: 11/04/1941    Quit date: 11/05/1971    Years since quitting: 52.9   Smokeless tobacco: Never  Vaping Use   Vaping status: Never Used  Substance Use Topics   Alcohol  use: No    Alcohol /week: 0.0 standard drinks of alcohol    Drug use: No     Allergies  Allergen Reactions   Dorzolamide  Hcl-Timolol  Mal Other (See Comments)    Red eyes   Tetanus Toxoid-Containing Vaccines Swelling    Arm was twice the size it should be   Hctz [Hydrochlorothiazide] Rash    Review of Systems NEGATIVE UNLESS OTHERWISE INDICATED  IN HPI      Objective:     BP 122/76 (BP Location: Left Arm, Patient Position: Sitting, Cuff Size: Normal)   Pulse (!) 53   Temp (!) 97.2 F (36.2 C) (Temporal)   Ht 5' 2 (1.575 m)   Wt 151 lb (68.5 kg)   LMP  (LMP Unknown)   SpO2 97%   BMI 27.62 kg/m   Wt Readings from Last 3 Encounters:  10/10/24 151 lb (68.5 kg)  07/26/24 151 lb 3.2 oz (68.6 kg)  06/28/24 150 lb 12.8 oz (68.4 kg)    BP Readings from Last 3 Encounters:  10/10/24 122/76  07/12/24 97/65  06/28/24 120/70     Physical Exam Vitals and nursing note reviewed.  Constitutional:      Appearance: Normal appearance.  HENT:     Head: Normocephalic.     Mouth/Throat:     Mouth: Mucous membranes are moist.  Eyes:     Extraocular Movements: Extraocular movements intact.     Conjunctiva/sclera: Conjunctivae normal.     Pupils: Pupils are equal, round, and reactive to light.  Cardiovascular:     Rate and Rhythm: Regular rhythm. Bradycardia present.     Heart sounds: No murmur heard. Pulmonary:     Effort: Pulmonary effort is normal.     Breath sounds: Normal breath sounds. No wheezing or rales.  Musculoskeletal:     Right lower leg: No edema.     Left lower leg: No edema.  Neurological:     General: No focal deficit present.     Mental Status: She is alert  and oriented to person, place, and time.     Cranial Nerves: No cranial nerve deficit.     Sensory: No sensory deficit (neurovasc normal in fingers / toes / feet / hands).     Motor: No weakness.     Coordination: Coordination normal.     Gait: Gait normal.  Psychiatric:        Mood and Affect: Mood normal.        Behavior: Behavior normal.             Finbar Nippert M Charnay Nazario, PA-C

## 2024-10-10 NOTE — Telephone Encounter (Signed)
 Patient would like to schedule for Prolia injection.  Stated that she was approved.  Cb# [phone].  Please advise.  Thank youwe

## 2024-10-12 ENCOUNTER — Ambulatory Visit: Admitting: Obstetrics and Gynecology

## 2024-10-12 ENCOUNTER — Encounter: Payer: Self-pay | Admitting: Obstetrics and Gynecology

## 2024-10-12 VITALS — BP 135/74 | HR 56

## 2024-10-12 DIAGNOSIS — N952 Postmenopausal atrophic vaginitis: Secondary | ICD-10-CM | POA: Diagnosis not present

## 2024-10-12 DIAGNOSIS — N393 Stress incontinence (female) (male): Secondary | ICD-10-CM | POA: Diagnosis not present

## 2024-10-12 DIAGNOSIS — Z960 Presence of urogenital implants: Secondary | ICD-10-CM

## 2024-10-12 NOTE — Telephone Encounter (Signed)
 Patient came in today, advised insurance covers injection, appt made.

## 2024-10-12 NOTE — Progress Notes (Signed)
 [facility]   Subjective:     Chief Complaint:  Chief Complaint  Patient presents with   Follow-up    Cheryl Wood is a 88 y.o. female here today for pessary check    History of Present Illness: Cheryl Wood is a 88 y.o. female with stress incontinence and OAB who presents for a pessary check. She is using a size #0 incontinence ring pessary. The pessary has been working well and she has no complaints. She is using vaginal estrogen in the form of an E-string. She reports some minimal vaginal bleeding.  Past Medical History: Patient  has a past medical history of Allergy, Arthritis, Bleeding of blood vessel, Blood in stool, Broken wrist, left, with routine healing, subsequent encounter (2025), Chronic kidney disease, Depression, Glaucoma, Heart murmur, History of chicken pox, History of recurrent UTIs, History of stomach ulcers, Hyperlipidemia, Hypertension, Memory loss, Rheumatic fever, Seizures (HCC), Stroke (HCC), TIA (transient ischemic attack) (10/22/2013), and Urine incontinence.   Past Surgical History: She  has a past surgical history that includes Abdominal hysterectomy; gum transplant; Cataract extraction (Right, May 13, 2014); RIGHT/LEFT HEART CATH AND CORONARY ANGIOGRAPHY (N/A, 05/12/2018); THORACIC AORTOGRAM (N/A, 05/12/2018); Eye surgery (12/13/2018); Appendectomy (1983); and Tonsillectomy.   Medications: She has a current medication list which includes the following prescription(s): acetaminophen , b complex vitamins, brimonidine, clopidogrel , cyanocobalamin , dapagliflozin  propanediol, estradiol , estring , famotidine , furosemide , contour next test, simponi  aria, lancets, loratadine , losartan , lovastatin , multiple vitamins-minerals, mycophenolate, NON FORMULARY, potassium chloride  sa, pramipexole , prednisolone  acetate, sertraline , timolol , timolol , and gemtesa , and the following Facility-Administered Medications: denosumab.   Allergies: Patient is allergic to  dorzolamide  hcl-timolol  mal, tetanus toxoid-containing vaccines, and hctz [hydrochlorothiazide].   Social History: Patient  reports that she quit smoking about 52 years ago. Her smoking use included cigarettes. She started smoking about 82 years ago. She has never used smokeless tobacco. She reports that she does not drink alcohol  and does not use drugs.      Objective:    Physical Exam: BP 135/74   Pulse (!) 56   LMP  (LMP Unknown)  Gen: No apparent distress, A&O x 3. Detailed Urogynecologic Evaluation:  Pelvic Exam: Normal external female genitalia; Bartholin's and Skene's glands normal in appearance; urethral meatus normal in appearance, no urethral masses or discharge. The pessary was noted to be in place. It was removed and cleaned. Speculum exam revealed erythema in the vagina. The pessary was replaced. It was comfortable to the patient and fit well. No E-string was replaced today due to rising costs of the E-string.      Assessment/Plan:    Assessment: Cheryl Wood is a 88 y.o. with stress incontinence and OAB here for a pessary check. She is doing well.  Plan: She will keep the pessary in place until next visit. She will continue to use estrogen in the form of cream. I encouraged her to use the cream nightly for two weeks and then twice a week after. She will follow-up in 3 months for a pessary check or sooner as needed.

## 2024-10-12 NOTE — Patient Instructions (Signed)
 Please use the estrogen cream every night for the next two weeks and then go back to using twice a week.   Please call me if the bleeding does not resolve after the two weeks and we will possibly do another pessary holiday.

## 2024-10-16 ENCOUNTER — Ambulatory Visit (INDEPENDENT_AMBULATORY_CARE_PROVIDER_SITE_OTHER): Admitting: Physician Assistant

## 2024-10-16 ENCOUNTER — Encounter: Payer: Self-pay | Admitting: Physician Assistant

## 2024-10-16 DIAGNOSIS — M810 Age-related osteoporosis without current pathological fracture: Secondary | ICD-10-CM | POA: Diagnosis not present

## 2024-10-16 NOTE — Progress Notes (Signed)
 Office Visit Note   Patient: Cheryl Wood           Date of Birth: Jul 17, 1936           MRN: [id number] Visit Date: 10/16/2024              Requested by: Allwardt, Mardy HERO, PA-C [address] [place],  [place] [address] PCP: Kathrene Mardy HERO, PA-C  Chief Complaint  Patient presents with   Injections    Patient is here for Prolia injection       HPI: Patient is a pleasant 88 year old woman who comes in today for her first Prolia injection.  Assessment & Plan: Visit Diagnoses:  1. Age-related osteoporosis without current pathological fracture     Plan: Will go forward with Prolia injection.  Subcu injection given Lot [id number] Patient will need a calcium drawn in 1 month may come in for nurses visit.  Reviewed importance of staying on calcium and vitamin D  as well as noting any symptoms of paresthesias that are unusual in the fingers and toes or cramping in the muscles.  Will follow-up in 6 months and 1 day for next injection Follow-Up Instructions: No follow-ups on file.   Ortho Exam  Patient is alert, oriented, no adenopathy, well-dressed, normal affect, normal respiratory effort.     Imaging: No results found. No images are attached to the encounter.  Labs: Lab Results  Component Value Date   HGBA1C 6.4 (A) 10/10/2024   HGBA1C 6.8 (H) 06/28/2024   HGBA1C 6.4 (A) 03/23/2024     Lab Results  Component Value Date   ALBUMIN 4.4 06/28/2024   ALBUMIN 4.5 03/23/2024   ALBUMIN 4.2 06/23/2023    Lab Results  Component Value Date   MG 2.3 03/23/2024   MG 2.1 07/03/2020   Lab Results  Component Value Date   VD25OH 46 07/26/2024   VD25OH 27.41 (L) 06/23/2023    No results found for: PREALBUMIN    Latest Ref Rng & Units 06/28/2024   10:51 AM 06/23/2023    9:07 AM 06/12/2022    4:08 PM  CBC EXTENDED  WBC 4.0 - 10.5 K/uL 5.9  6.5  6.1   RBC 3.87 - 5.11 Mil/uL 4.34  4.35  4.54   Hemoglobin 12.0 - 15.0 g/dL 86.5  86.5  85.6   HCT 36.0  - 46.0 % 40.7  41.3  43.8   Platelets 150.0 - 400.0 K/uL 174.0  168.0  176   NEUT# 1.4 - 7.7 K/uL 2.6  3.2    Lymph# 0.7 - 4.0 K/uL 2.5  2.5       There is no height or weight on file to calculate BMI.  Orders:  No orders of the defined types were placed in this encounter.  No orders of the defined types were placed in this encounter.    Procedures: No procedures performed  Clinical Data: No additional findings.  ROS:  All other systems negative, except as noted in the HPI. Review of Systems  Objective: Vital Signs: LMP  (LMP Unknown)   Specialty Comments:  No specialty comments available.  PMFS History: Patient Active Problem List   Diagnosis Date Noted   Age-related osteoporosis without current pathological fracture 07/26/2024   Acute right-sided low back pain with right-sided sciatica 06/28/2024   Fatigue 06/28/2024   Long term current use of therapeutic drug 06/28/2024   Neuropathy 06/28/2024   Primary localized osteoarthrosis of multiple sites 06/28/2024   Sciatica 06/28/2024  Osteopenia 04/08/2023   Type 2 diabetes mellitus with hyperglycemia, without long-term current use of insulin  (HCC) 02/22/2023   Chronic kidney disease, stage 3a (HCC) 02/22/2023   Palpitations 01/12/2023   Bilateral lower extremity edema 10/22/2021   Iritis 08/05/2021   Lumbar radiculopathy 07/15/2021   Seronegative rheumatoid arthritis (HCC) 01/03/2021   Depression, major, single episode, mild 09/29/2019   Polyarthralgia 04/21/2019   Controlled type 2 diabetes mellitus without complication, without long-term current use of insulin  (HCC) 01/27/2019   CVA (cerebral vascular accident) (HCC) 01/25/2019   RLS (restless legs syndrome) 01/25/2019   Glaucoma, left eye 01/25/2019   Nonrheumatic aortic valve insufficiency 12/09/2018   CAD in native artery 12/09/2018   Iridocyclitis of left eye 07/29/2018   Pseudophakia of both eyes 07/29/2018   Retinal edema 07/29/2018   Glaucoma  associated with ocular inflammation, right, mild stage 07/21/2018   DOE (dyspnea on exertion) 03/30/2018   Acute diffuse otitis externa of left ear 04/26/2017   Transient global amnesia 11/09/2013   TIA (transient ischemic attack) 10/22/2013   HTN (hypertension) 10/22/2013   Hyperlipidemia 10/22/2013   Past Medical History:  Diagnosis Date   Allergy    Arthritis    Bleeding of blood vessel    ext. genitalia area   Blood in stool    Broken wrist, left, with routine healing, subsequent encounter 2025   pt stated declined surgery   Chronic kidney disease    Depression    Glaucoma    Heart murmur    History of chicken pox    History of recurrent UTIs    History of stomach ulcers    Hyperlipidemia    Hypertension    Memory loss    Rheumatic fever    Seizures (HCC)    Stroke (HCC)    2007; embolic   TIA (transient ischemic attack) 10/22/2013   Urine incontinence     Family History  Problem Relation Age of Onset   Bladder Cancer Mother 64   Early death Mother    Heart disease Father    Arthritis Father    Early death Father    Hearing loss Father    Hypertension Father    Hyperlipidemia Father    Thyroid  cancer Sister    Arthritis Sister    Alcohol  abuse Sister    COPD Sister    Drug abuse Sister    Early death Sister    Heart disease Sister    Hypertension Sister    Hyperlipidemia Sister    Arthritis Brother    Diabetes Brother    Heart attack Brother    Heart disease Brother    Hyperlipidemia Brother    Hypertension Brother    Early death Brother    Early death Brother    Hyperlipidemia Maternal Grandmother    Hypertension Maternal Grandmother    Stroke Maternal Grandmother    Alcohol  abuse Maternal Grandfather    Early death Maternal Grandfather        tuberculosis   Arthritis Paternal Grandmother    Alcohol  abuse Paternal Grandfather    Heart disease Paternal Grandfather    Hyperlipidemia Paternal Grandfather    Hypertension Paternal Grandfather     Stroke Paternal Grandfather    Diabetes Daughter    Heart disease Daughter    Hyperlipidemia Daughter    Kidney disease Niece    Diabetes Niece    Renal cancer Neg Hx    Uterine cancer Neg Hx     Past Surgical History:  Procedure  Laterality Date   ABDOMINAL HYSTERECTOMY     APPENDECTOMY  1983   CATARACT EXTRACTION Right May 13, 2014   EYE SURGERY  12/13/2018   stent inserted in left eye   gum transplant     RIGHT/LEFT HEART CATH AND CORONARY ANGIOGRAPHY N/A 05/12/2018   Procedure: RIGHT/LEFT HEART CATH AND CORONARY ANGIOGRAPHY;  Surgeon: Claudene Victory ORN, MD;  Location: [facility] INVASIVE CV LAB;  Service: Cardiovascular;  Laterality: N/A;   THORACIC AORTOGRAM N/A 05/12/2018   Procedure: THORACIC AORTOGRAM;  Surgeon: Claudene Victory ORN, MD;  Location: [facility] INVASIVE CV LAB;  Service: Cardiovascular;  Laterality: N/A;   TONSILLECTOMY     Social History   Occupational History    Employer: RETIRED  Tobacco Use   Smoking status: Former    Current packs/day: 0.00    Types: Cigarettes    Start date: 11/04/1941    Quit date: 11/05/1971    Years since quitting: 52.9   Smokeless tobacco: Never  Vaping Use   Vaping status: Never Used  Substance and Sexual Activity   Alcohol  use: No    Alcohol /week: 0.0 standard drinks of alcohol    Drug use: No   Sexual activity: Not Currently

## 2024-10-17 MED ORDER — DENOSUMAB 60 MG/ML ~~LOC~~ SOSY: 60.0000 mg | PREFILLED_SYRINGE | Freq: Once | SUBCUTANEOUS | Status: AC

## 2024-10-17 NOTE — Addendum Note (Signed)
 Addended by: LYNNANN HARVEY E on: 10/17/2024 08:25 AM   Modules accepted: Orders

## 2024-10-18 ENCOUNTER — Ambulatory Visit: Admitting: Cardiovascular Disease

## 2024-10-28 ENCOUNTER — Other Ambulatory Visit: Payer: Self-pay | Admitting: Physician Assistant

## 2024-10-30 DIAGNOSIS — M0600 Rheumatoid arthritis without rheumatoid factor, unspecified site: Secondary | ICD-10-CM | POA: Diagnosis not present

## 2024-10-31 ENCOUNTER — Encounter: Payer: Self-pay | Admitting: Obstetrics and Gynecology

## 2024-10-31 ENCOUNTER — Ambulatory Visit (INDEPENDENT_AMBULATORY_CARE_PROVIDER_SITE_OTHER): Admitting: Obstetrics and Gynecology

## 2024-10-31 VITALS — BP 131/81 | HR 79 | Ht 62.0 in | Wt 151.0 lb

## 2024-10-31 DIAGNOSIS — N393 Stress incontinence (female) (male): Secondary | ICD-10-CM

## 2024-10-31 DIAGNOSIS — Z466 Encounter for fitting and adjustment of urinary device: Secondary | ICD-10-CM

## 2024-10-31 DIAGNOSIS — N3281 Overactive bladder: Secondary | ICD-10-CM

## 2024-10-31 DIAGNOSIS — N952 Postmenopausal atrophic vaginitis: Secondary | ICD-10-CM

## 2024-10-31 NOTE — Patient Instructions (Signed)
 Use vaginal estrogen nightly for the next 3 nights and then drop back down to twice a week.

## 2024-10-31 NOTE — Progress Notes (Signed)
 [facility]   Subjective:     Chief Complaint:  Chief Complaint  Patient presents with   Pessary Check    Cheryl Wood is a 88 y.o. female here for pessary check; fell out about 10/18    History of Present Illness: Cheryl Wood is a 88 y.o. female with stress incontinence and OAB who presents for a pessary check. She is using a size #0 incontinence ring pessary. The pessary popped out after a few days of last change. Patient reports she has been doing her estrogen cream for support.   Past Medical History: Patient  has a past medical history of Allergy, Arthritis, Bleeding of blood vessel, Blood in stool, Broken wrist, left, with routine healing, subsequent encounter (2025), Chronic kidney disease, Depression, Glaucoma, Heart murmur, History of chicken pox, History of recurrent UTIs, History of stomach ulcers, Hyperlipidemia, Hypertension, Memory loss, Rheumatic fever, Seizures (HCC), Stroke (HCC), TIA (transient ischemic attack) (10/22/2013), and Urine incontinence.   Past Surgical History: She  has a past surgical history that includes Abdominal hysterectomy; gum transplant; Cataract extraction (Right, May 13, 2014); RIGHT/LEFT HEART CATH AND CORONARY ANGIOGRAPHY (N/A, 05/12/2018); THORACIC AORTOGRAM (N/A, 05/12/2018); Eye surgery (12/13/2018); Appendectomy (1983); and Tonsillectomy.   Medications: She has a current medication list which includes the following prescription(s): acetaminophen , b complex vitamins, brimonidine, clopidogrel , cyanocobalamin , dapagliflozin  propanediol, estring , famotidine , furosemide , contour next test, simponi  aria, lancets, loratadine , losartan , lovastatin , multiple vitamins-minerals, mycophenolate, NON FORMULARY, potassium chloride  sa, pramipexole , sertraline , timolol , and gemtesa , and the following Facility-Administered Medications: denosumab.   Allergies: Patient is allergic to dorzolamide  hcl-timolol  mal, tetanus toxoid-containing vaccines,  and hctz [hydrochlorothiazide].   Social History: Patient  reports that she quit smoking about 53 years ago. Her smoking use included cigarettes. She started smoking about 83 years ago. She has never used smokeless tobacco. She reports that she does not drink alcohol  and does not use drugs.      Objective:    Physical Exam: BP 131/81   Pulse 79   Ht 5' 2 (1.575 m)   Wt 151 lb (68.5 kg)   LMP  (LMP Unknown)   BMI 27.62 kg/m  Gen: No apparent distress, A&O x 3. Detailed Urogynecologic Evaluation:  Pelvic Exam: Normal external female genitalia; Bartholin's and Skene's glands normal in appearance; urethral meatus normal in appearance, no urethral masses or discharge. The pessary was changed to a #1 incontinence ring with support. Patient was able to valsalva, cough, attempt urination and defecation without pessary expulsion.      Assessment/Plan:    Assessment: Cheryl Wood is a 88 y.o. with stress incontinence and OAB here for a pessary check. She is doing well.  Plan: She will keep the pessary in place until next visit. She will continue to use estrogen in the form of cream. I encouraged her to use the cream nightly for three more nights then drop down to twice a week.   Patient to return in 3 months for pessary check or sooner if needed.

## 2024-11-13 ENCOUNTER — Ambulatory Visit: Admitting: Cardiovascular Disease

## 2024-11-16 ENCOUNTER — Encounter: Payer: Self-pay | Admitting: Cardiovascular Disease

## 2024-11-16 ENCOUNTER — Ambulatory Visit

## 2024-11-16 ENCOUNTER — Ambulatory Visit: Attending: Cardiovascular Disease | Admitting: Cardiovascular Disease

## 2024-11-16 VITALS — BP 136/62 | HR 80 | Ht 62.0 in | Wt 156.0 lb

## 2024-11-16 DIAGNOSIS — I10 Essential (primary) hypertension: Secondary | ICD-10-CM

## 2024-11-16 DIAGNOSIS — R600 Localized edema: Secondary | ICD-10-CM | POA: Diagnosis not present

## 2024-11-16 DIAGNOSIS — E782 Mixed hyperlipidemia: Secondary | ICD-10-CM

## 2024-11-16 DIAGNOSIS — I2510 Atherosclerotic heart disease of native coronary artery without angina pectoris: Secondary | ICD-10-CM

## 2024-11-16 NOTE — Assessment & Plan Note (Addendum)
 History of essential hypertension blood pressure measured today at 168/80.  She says usually when she takes her blood pressure at home it runs in the 115/76 range and she says she has an element of whitecoat hypertension.  She is on losartan .  Follow-up blood pressure at the end of her visit was 136/62.

## 2024-11-16 NOTE — Assessment & Plan Note (Signed)
 Bilateral lower extremity edema on furosemide  and compression stockings which adequately addresses the problem.  She does have normal LV function with grade 1 diastolic dysfunction.

## 2024-11-16 NOTE — Assessment & Plan Note (Signed)
 History of hyperlipidemia on statin therapy with lipid profile performed 06/28/2024 revealing total cholesterol 179, LDL 107 and HDL 49.  She did have minimal nonobstructive disease at cath in 2019.  At this point, given her age I did not feel compelled to be more aggressive with lipid management.

## 2024-11-16 NOTE — Patient Instructions (Signed)

## 2024-11-16 NOTE — Assessment & Plan Note (Signed)
 History of minimal nonobstructive CAD by cath performed Dr. Claudene 05/12/2018.  She denies chest pain.

## 2024-11-16 NOTE — Progress Notes (Signed)
 11/16/2024 Cheryl Wood   1936/10/19  [id number]  Primary Physician Allwardt, Mardy HERO, PA-C Primary Cardiologist: Dorn JINNY Lesches MD [facility] [place], [place]  HPI:  Cheryl Wood is a 89 y.o.  moderately overweight divorced Caucasian female mother of 2 children, grandmother of 1 grandchild. 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  She was referred by her primary provider, [name] Allwardt PA-C for chronic dyspnea on exertion.  She was previously a patient of Dr. Jacques Somerset .  I last saw her last saw her in the office 01/12/2023.  She is accompanied by one of her daughters Cheryl Wood today.  She does have a history of treated hypertension, diabetes and hyperlipidemia.  She is never had a heart attack but did have a stroke back in 2007 with right-sided motor deficits that resolved with prolonged physical therapy.  She complains of dyspnea but denies chest pain.  She did have a 2D echo performed 03/28/2021 that showed normal LV systolic function, grade 1 diastolic dysfunction with mild to moderate AI.  She had a right left heart cath performed by Dr. Claudene 05/12/2018 revealing minimal nonobstructive CAD with a pulmonary capital wedge pressure of 10 and an LVEDP of 23.   Since I saw her in the office almost 2 years ago she has remained stable.  She gets occasional quick atypical chest pains at night.  She had an event monitor performed 06/23/2022 that showed frequent PACs, rare PVCs and occasional runs of SVT.  She does have occasional palpitations probably related to results of her Zio patch.  I did offer her the option of a low-dose beta-blocker which she declined.  Apparently she and her daughter are moving to [facility] .   Current Meds  Medication Sig   acetaminophen  (TYLENOL ) 650 MG CR tablet Take 650 mg by mouth every 8 (eight) hours as needed for pain. Taking 2 in am and 2 at pm   b complex vitamins capsule Take 1 capsule by mouth daily.   clopidogrel  (PLAVIX ) 75  MG tablet Take 1 tablet (75 mg total) by mouth daily.   cyanocobalamin  (VITAMIN B12) 1000 MCG tablet Take 1,000 mcg by mouth daily.   dapagliflozin  propanediol (FARXIGA ) 5 MG TABS tablet Take 1 tablet (5 mg total) by mouth daily before breakfast.   famotidine  (PEPCID ) 20 MG tablet TAKE 1 TABLET BY MOUTH 2 TIMES A DAY   furosemide  (LASIX ) 80 MG tablet Take 2 tablets (160 mg total) by mouth daily.   glucose blood (CONTOUR NEXT TEST) test strip Use as instructed   golimumab  (SIMPONI  ARIA) 50 MG/4ML SOLN injection Inject into the vein. Every 8 weeks   Lancets MISC Check blood sugar once daily. Contour Next Device.   loratadine  (CLARITIN ) 10 MG tablet Take 10 mg by mouth daily.   losartan  (COZAAR ) 100 MG tablet Take 1/2 tab po every other day. (Patient taking differently: Take by mouth daily. Take 1/2 tab po every other day. Pt states only taking 25 mg; half of a half pill)   lovastatin  (MEVACOR ) 40 MG tablet Take 1 tablet (40 mg total) by mouth at bedtime.   Multiple Vitamins-Minerals (CENTRUM SILVER 50+WOMEN PO) Take 1 tablet by mouth daily.   mycophenolate (CELLCEPT) 500 MG tablet Take 500 mg by mouth.   potassium chloride  SA (KLOR-CON  M20) 20 MEQ tablet Take 1 tablet (20 mEq total) by mouth daily.   pramipexole  (MIRAPEX ) 0.5 MG tablet TAKE 1 TABLET BY MOUTH AT BEDTIME  sertraline  (ZOLOFT ) 25 MG tablet Take 1 tablet (25 mg total) by mouth daily.   timolol  (TIMOPTIC ) 0.5 % ophthalmic solution SMARTSIG:In Eye(s)   Vibegron  (GEMTESA ) 75 MG TABS Take 1 tablet (75 mg total) by mouth daily.   Current Facility-Administered Medications for the 11/16/24 encounter (Office Visit) with Court Dorn PARAS, MD  Medication   denosumab (PROLIA) injection 60 mg     Allergies  Allergen Reactions   Dorzolamide  Hcl-Timolol  Mal Other (See Comments)    Red eyes   Tetanus Toxoid-Containing Vaccines Swelling    Arm was twice the size it should be   Hctz [Hydrochlorothiazide] Rash    Social History    Socioeconomic History   Marital status: Divorced    Spouse name: Not on file   Number of children: 2   Years of education: college   Highest education level: Not on file  Occupational History    Employer: RETIRED  Tobacco Use   Smoking status: Former    Current packs/day: 0.00    Types: Cigarettes    Start date: 11/04/1941    Quit date: 11/05/1971    Years since quitting: 53.0   Smokeless tobacco: Never  Vaping Use   Vaping status: Never Used  Substance and Sexual Activity   Alcohol  use: No    Alcohol /week: 0.0 standard drinks of alcohol    Drug use: No   Sexual activity: Not Currently  Other Topics Concern   Not on file  Social History Narrative   Patient lives at home alone   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, civil (consulting)   Worked at [employer] for 18 years   Daughters in [place]  and in [place]   Caffeine Use: 2-3 cups daily   Social Drivers of Corporate Investment Banker Strain: Low Risk  (04/10/2024)   Overall Financial Resource Strain (CARDIA)    Difficulty of Paying Living Expenses: Not hard at all  Food Insecurity: No Food Insecurity (04/10/2024)   Hunger Vital Sign    Worried About Running Out of Food in the Last Year: Never true    Ran Out of Food in the Last Year: Never true  Transportation Needs: No Transportation Needs (04/10/2024)   PRAPARE - Administrator, Civil Service (Medical): No    Lack of Transportation (Non-Medical): No  Physical Activity: Insufficiently Active (04/10/2024)   Exercise Vital Sign    Days of Exercise per Week: 3 days    Minutes of Exercise per Session: 30 min  Stress: No Stress Concern Present (04/10/2024)   [employer] of Occupational Health - Occupational Stress Questionnaire    Feeling of Stress : Not at all  Social Connections: Moderately Isolated (04/10/2024)   Social Connection and Isolation Panel    Frequency of Communication with Friends and Family: More than three times a week     Frequency of Social Gatherings with Friends and Family: Three times a week    Attends Religious Services: More than 4 times per year    Active Member of Clubs or Organizations: No    Attends Banker Meetings: Never    Marital Status: Divorced  Catering Manager Violence: Not At Risk (04/10/2024)   Humiliation, Afraid, Rape, and Kick questionnaire    Fear of Current or Ex-Partner: No    Emotionally Abused: No    Physically Abused: No    Sexually Abused: No     Review of Systems: General: negative for chills, fever, night sweats  or weight changes.  Cardiovascular: negative for chest pain, dyspnea on exertion, edema, orthopnea, palpitations, paroxysmal nocturnal dyspnea or shortness of breath Dermatological: negative for rash Respiratory: negative for cough or wheezing Urologic: negative for hematuria Abdominal: negative for nausea, vomiting, diarrhea, bright red blood per rectum, melena, or hematemesis Neurologic: negative for visual changes, syncope, or dizziness All other systems reviewed and are otherwise negative except as noted above.    Blood pressure 136/62, pulse 80, height 5' 2 (1.575 m), weight 156 lb (70.8 kg), SpO2 95%.  General appearance: alert and no distress Neck: no adenopathy, no carotid bruit, no JVD, supple, symmetrical, trachea midline, and thyroid  not enlarged, symmetric, no tenderness/mass/nodules Lungs: clear to auscultation bilaterally Heart: Soft outflow tract murmur Extremities: 1+ bilateral lower extremity edema wearing compression stockings. Pulses: 2+ and symmetric Skin: Skin color, texture, turgor normal. No rashes or lesions Neurologic: Grossly normal  EKG EKG Interpretation Date/Time:  Thursday November 16 2024 09:22:41 EST Ventricular Rate:  70 PR Interval:  200 QRS Duration:  78 QT Interval:  378 QTC Calculation: 408 R Axis:   -12  Text Interpretation: Normal sinus rhythm with sinus arrhythmia Minimal voltage criteria for LVH,  may be normal variant ( R in aVL ) Anterior infarct (cited on or before 28-Aug-2021) When compared with ECG of 12-Jun-2022 15:45, No significant change was found Confirmed by Court Carrier [phone]) on 11/16/2024 9:30:58 AM    ASSESSMENT AND PLAN:   HTN (hypertension) History of essential hypertension blood pressure measured today at 168/80.  She says usually when she takes her blood pressure at home it runs in the 115/76 range and she says she has an element of whitecoat hypertension.  She is on losartan .  Follow-up blood pressure at the end of her visit was 136/62.  Hyperlipidemia History of hyperlipidemia on statin therapy with lipid profile performed 06/28/2024 revealing total cholesterol 179, LDL 107 and HDL 49.  She did have minimal nonobstructive disease at cath in 2019.  At this point, given her age I did not feel compelled to be more aggressive with lipid management.  CAD in native artery History of minimal nonobstructive CAD by cath performed Dr. Claudene 05/12/2018.  She denies chest pain.  Bilateral lower extremity edema Bilateral lower extremity edema on furosemide  and compression stockings which adequately addresses the problem.  She does have normal LV function with grade 1 diastolic dysfunction.     Carrier DOROTHA Court MD FACP,FACC,FAHA, [facility] 11/16/2024 9:42 AM

## 2024-12-13 ENCOUNTER — Telehealth: Payer: Self-pay

## 2024-12-13 DIAGNOSIS — N939 Abnormal uterine and vaginal bleeding, unspecified: Secondary | ICD-10-CM | POA: Diagnosis not present

## 2024-12-13 NOTE — Telephone Encounter (Signed)
 Called and spoke to Cheryl Wood after listening to her voicemail. With guidance from K.Zuleta. Pt will increase her vaginal estrogen to nightly. She will call if bleeding worsens or has any other discharge occurs. She did choose to keep her pessary in place. Patient agreed with current plan.she will be in [place] until after the first of the year.

## 2024-12-14 ENCOUNTER — Telehealth: Payer: Self-pay

## 2024-12-14 NOTE — Telephone Encounter (Signed)
 Transition Care Management Unsuccessful Follow-up Telephone Call  Date of discharge and from where: 12/13/24; [facility];   Attempts:  1st Attempt  Reason for unsuccessful TCM follow-up call:  Left voice message; LVM for patient to complete TOC call and schedule for ED follow up with PCP. Advised to call our office to schedule this appointment. If pt returns call please schedule pt accordingly.

## 2025-01-03 ENCOUNTER — Ambulatory Visit: Admitting: Family Medicine

## 2025-01-03 ENCOUNTER — Encounter: Payer: Self-pay | Admitting: Family Medicine

## 2025-01-03 VITALS — BP 128/76 | HR 74 | Temp 97.2°F | Ht 62.0 in | Wt 150.4 lb

## 2025-01-03 DIAGNOSIS — J40 Bronchitis, not specified as acute or chronic: Secondary | ICD-10-CM

## 2025-01-03 MED ORDER — PREDNISONE 20 MG PO TABS: 40.0000 mg | ORAL_TABLET | Freq: Every day | ORAL | 0 refills | Status: AC

## 2025-01-03 MED ORDER — CEFDINIR 300 MG PO CAPS: 300.0000 mg | ORAL_CAPSULE | Freq: Two times a day (BID) | ORAL | 0 refills | Status: AC

## 2025-01-03 NOTE — Progress Notes (Signed)
 "  Subjective:     Patient ID: Cheryl Wood, female    DOB: 02-12-1936, 89 y.o.   MRN: [id number]  Chief Complaint  Patient presents with   Cough    Pt has cough and congestion since the week before Christmas    Discussed the use of AI scribe software for clinical note transcription with the patient, who gave verbal consent to proceed.  History of Present Illness Cheryl Wood is an 89 year old female who presents with a persistent cough and congestion. She is accompanied by her daughter, Cheryl Wood.  She has been experiencing a persistent cough and congestion that began a week before Christmas, lasting for approximately three weeks. The cough is productive, with sputum that sometimes appears green and occasionally contains blood. No fever, dizziness, or lightheadedness. She notes wheezing and shortness of breath, which is a chronic issue. She has a history of wheezing but no diagnosed lung disease.  She experiences vomiting due to the severity of her coughing fits, but no diarrhea. She mentions that she gets sick every winter, and her daughter reports that these episodes last about eight weeks and have typically required treatment with steroids and antibiotics. She no longer uses an inhaler regularly, relying instead on antibiotics and steroids during these episodes.  Her current medications include coricedin HBP medication, Robitussin, and cough drops. She also has a history of diabetes, with an A1c of 6.4 in October.    Health Maintenance Due  Topic Date Due   OPHTHALMOLOGY EXAM  11/25/2023   Mammogram  10/26/2024    Past Medical History:  Diagnosis Date   Allergy    Arthritis    Bleeding of blood vessel    ext. genitalia area   Blood in stool    Broken wrist, left, with routine healing, subsequent encounter 2025   pt stated declined surgery   Chronic kidney disease    Depression    Glaucoma    Heart murmur    History of chicken pox    History of recurrent UTIs    History  of stomach ulcers    Hyperlipidemia    Hypertension    Memory loss    Rheumatic fever    Seizures (HCC)    Stroke (HCC)    2007; embolic   TIA (transient ischemic attack) 10/22/2013   Urine incontinence     Past Surgical History:  Procedure Laterality Date   ABDOMINAL HYSTERECTOMY     APPENDECTOMY  1983   CATARACT EXTRACTION Right May 13, 2014   EYE SURGERY  12/13/2018   stent inserted in left eye   gum transplant     RIGHT/LEFT HEART CATH AND CORONARY ANGIOGRAPHY N/A 05/12/2018   Procedure: RIGHT/LEFT HEART CATH AND CORONARY ANGIOGRAPHY;  Surgeon: Claudene Victory ORN, MD;  Location: [facility] INVASIVE CV LAB;  Service: Cardiovascular;  Laterality: N/A;   THORACIC AORTOGRAM N/A 05/12/2018   Procedure: THORACIC AORTOGRAM;  Surgeon: Claudene Victory ORN, MD;  Location: [facility] INVASIVE CV LAB;  Service: Cardiovascular;  Laterality: N/A;   TONSILLECTOMY      Current Medications[1]  Allergies[2] ROS neg/noncontributory except as noted HPI/below      Objective:     BP 128/76 (BP Location: Left Arm, Patient Position: Sitting, Cuff Size: Normal)   Pulse 74   Temp (!) 97.2 F (36.2 C) (Temporal)   Ht 5' 2 (1.575 m)   Wt 150 lb 6 oz (68.2 kg)   LMP  (LMP Unknown)   SpO2 97%  BMI 27.50 kg/m  Wt Readings from Last 3 Encounters:  01/03/25 150 lb 6 oz (68.2 kg)  11/16/24 156 lb (70.8 kg)  10/31/24 151 lb (68.5 kg)    Physical Exam GENERAL: Well developed, well nourished, no acute distress. HEAD EYES EARS NOSE THROAT: Normocephalic, atraumatic, conjunctiva not injected, sclera nonicteric. Tympanic membranes gray with good landmarks bilaterally. Wears ha. Oral cavity clear, moist, without exudates. Sinuses non-tender to percussion. CARDIAC: Regular rate and rhythm, S1 S2 present,. NECK: Supple, no thyromegaly, no cervical lymphadenopathy LUNGS: Coarse lung sounds and mild expiratory wheezing. EXTREMITIES: No edema. MUSCULOSKELETAL: No gross abnormalities. NEUROLOGICAL: Alert and oriented x3,  cranial nerves II through XII intact. PSYCHIATRIC: Normal mood, good eye contact.   Has f/u w/pcp soon    Assessment & Plan:  Bronchitis  Other orders -     predniSONE ; Take 2 tablets (40 mg total) by mouth daily with breakfast for 5 days.  Dispense: 10 tablet; Refill: 0 -     Cefdinir ; Take 1 capsule (300 mg total) by mouth 2 (two) times daily.  Dispense: 14 capsule; Refill: 0    Assessment and Plan Assessment & Plan Bronchitis   She has experienced chronic cough and congestion for three weeks, with green sputum and occasional hemoptysis. There is no fever or increased dyspnea, but wheezing is present. She has recurrent bronchitis every winter, typically resolving in eight weeks, and has been previously treated with steroids and antibiotics. There are no known antibiotic allergies. Prescribe prednisone , two tablets once daily with food, and Omnicef , to be taken twice daily. Advise her to report if symptoms worsen or do not improve. A follow-up with PCPis scheduled in two weeks.  Type 2 diabetes mellitus   Her A1c was 6.4% in October. Steroids may elevate blood glucose levels, but this is acceptable given the current clinical situation.     Return if symptoms worsen or fail to improve.  Cheryl CHRISTELLA Carrel, MD     [1]  Current Outpatient Medications:    acetaminophen  (TYLENOL ) 650 MG CR tablet, Take 650 mg by mouth every 8 (eight) hours as needed for pain. Taking 2 in am and 2 at pm, Disp: , Rfl:    b complex vitamins capsule, Take 1 capsule by mouth daily., Disp: , Rfl:    brimonidine (ALPHAGAN) 0.2 % ophthalmic solution, Place 1 drop into both eyes 2 (two) times daily., Disp: , Rfl:    cefdinir  (OMNICEF ) 300 MG capsule, Take 1 capsule (300 mg total) by mouth 2 (two) times daily., Disp: 14 capsule, Rfl: 0   clopidogrel  (PLAVIX ) 75 MG tablet, Take 1 tablet (75 mg total) by mouth daily., Disp: 90 tablet, Rfl: 3   cyanocobalamin  (VITAMIN B12) 1000 MCG tablet, Take 1,000 mcg by mouth  daily., Disp: , Rfl:    dapagliflozin  propanediol (FARXIGA ) 5 MG TABS tablet, Take 1 tablet (5 mg total) by mouth daily before breakfast., Disp: 90 tablet, Rfl: 3   ESTRING  7.5 MCG/24HR vaginal ring, PLACE 1 RING INTO THE VAGINA EVERY 3 MONTHS AS DIRECTED, Disp: 1 each, Rfl: 3   famotidine  (PEPCID ) 20 MG tablet, TAKE 1 TABLET BY MOUTH 2 TIMES A DAY, Disp: 180 tablet, Rfl: 2   furosemide  (LASIX ) 80 MG tablet, Take 2 tablets (160 mg total) by mouth daily., Disp: 180 tablet, Rfl: 3   glucose blood (CONTOUR NEXT TEST) test strip, Use as instructed, Disp: 100 each, Rfl: 12   golimumab  (SIMPONI  ARIA) 50 MG/4ML SOLN injection, Inject into the vein. Every 8  weeks, Disp: , Rfl:    Lancets MISC, Check blood sugar once daily. Contour Next Device., Disp: 100 each, Rfl: 0   loratadine  (CLARITIN ) 10 MG tablet, Take 10 mg by mouth daily., Disp: , Rfl:    losartan  (COZAAR ) 100 MG tablet, Take 1/2 tab po every other day. (Patient taking differently: Take by mouth daily. Take 1/2 tab po every other day. Pt states only taking 25 mg; half of a half pill), Disp: 45 tablet, Rfl: 3   lovastatin  (MEVACOR ) 40 MG tablet, Take 1 tablet (40 mg total) by mouth at bedtime., Disp: 90 tablet, Rfl: 3   Multiple Vitamins-Minerals (CENTRUM SILVER 50+WOMEN PO), Take 1 tablet by mouth daily., Disp: , Rfl:    mycophenolate (CELLCEPT) 500 MG tablet, Take 500 mg by mouth., Disp: , Rfl:    NON FORMULARY, Take 900 mg by mouth in the morning., Disp: , Rfl:    potassium chloride  SA (KLOR-CON  M20) 20 MEQ tablet, Take 1 tablet (20 mEq total) by mouth daily., Disp: 90 tablet, Rfl: 3   pramipexole  (MIRAPEX ) 0.5 MG tablet, TAKE 1 TABLET BY MOUTH AT BEDTIME, Disp: 90 tablet, Rfl: 1   predniSONE  (DELTASONE ) 20 MG tablet, Take 2 tablets (40 mg total) by mouth daily with breakfast for 5 days., Disp: 10 tablet, Rfl: 0   sertraline  (ZOLOFT ) 25 MG tablet, Take 1 tablet (25 mg total) by mouth daily., Disp: 90 tablet, Rfl: 3   timolol  (TIMOPTIC ) 0.5 %  ophthalmic solution, SMARTSIG:In Eye(s), Disp: , Rfl:    Vibegron  (GEMTESA ) 75 MG TABS, Take 1 tablet (75 mg total) by mouth daily., Disp: 30 tablet, Rfl: 11  Current Facility-Administered Medications:    denosumab  (PROLIA ) injection 60 mg, 60 mg, Subcutaneous, Once, Persons, Ronal Dragon, PA [2]  Allergies Allergen Reactions   Dorzolamide  Hcl-Timolol  Mal Other (See Comments)    Red eyes   Tetanus Toxoid-Containing Vaccines Swelling    Arm was twice the size it should be   Hctz [Hydrochlorothiazide] Rash   "

## 2025-01-03 NOTE — Patient Instructions (Signed)
 Sent prednisone  and omnicef   Worse, new symptoms, let us  know

## 2025-01-04 ENCOUNTER — Ambulatory Visit

## 2025-01-08 ENCOUNTER — Ambulatory Visit: Payer: Self-pay | Admitting: Physician Assistant

## 2025-01-08 NOTE — Telephone Encounter (Signed)
 Please see triage note and advise, pt declines OV

## 2025-01-08 NOTE — Telephone Encounter (Signed)
 FYI Only or Action Required?: Action required by provider: Med request, prednisone  and cefdinir  .  Patient was last seen in primary care on 01/03/2025 by Wendolyn Jenkins Jansky, MD.  Called Nurse Triage reporting Cough.  Symptoms began several weeks ago.  Interventions attempted: Prescription medications: Prednisone , Cefdinir .  Symptoms are: unchanged.  Triage Disposition: See HCP Within 4 Hours (Or PCP Triage)  Patient/caregiver understands and will follow disposition?: No  Reason for Disposition  Wheezing is present  Answer Assessment - Initial Assessment Questions Seen 01/03/25, given predniSONE  and Cefdinir . Pt reports not feeling better. Patient denies OV, requesting refill on medication. Arloa Prior on file. Please advise. CB [phone]  1. ONSET: When did the cough begin?      4 weeks ago  2. SEVERITY: How bad is the cough today?      Coughing so much it causes vomiting on occasion  3. SPUTUM: Describe the color of your sputum (e.g., none, dry cough; clear, white, yellow, green)     Green   4. HEMOPTYSIS: Are you coughing up any blood? If Yes, ask: How much? (e.g., flecks, streaks, tablespoons, etc.)     Sometimes bright, sometimes dark mixed in with mucous  5. DIFFICULTY BREATHING: Are you having difficulty breathing? If Yes, ask: How bad is it? (e.g., mild, moderate, severe)      Denies  6. FEVER: Do you have a fever? If Yes, ask: What is your temperature, how was it measured, and when did it start?     Denies  7. OTHER SYMPTOMS: Do you have any other symptoms? (e.g., runny nose, wheezing, chest pain)       Whole diaphragm and waist is sore from coughing  Protocols used: Cough - Acute Productive-A-AH    Copied from CRM #[id number]. Topic: Clinical - Red Word Triage >> Jan 08, 2025 12:01 PM Winona R wrote: Pt still experiencing symptoms of Bronchitis after her appointment 01/0. Coughing Green mucus with some blood, chest sore from coughing and some  times vomiting when she coughs pt asking for more medications

## 2025-01-08 NOTE — Telephone Encounter (Signed)
 Noted. Would like to see her have a recheck visit please.

## 2025-01-09 ENCOUNTER — Ambulatory Visit: Admitting: Obstetrics and Gynecology

## 2025-01-12 ENCOUNTER — Ambulatory Visit: Admitting: Obstetrics and Gynecology

## 2025-01-16 ENCOUNTER — Encounter: Payer: Self-pay | Admitting: Physician Assistant

## 2025-01-16 ENCOUNTER — Ambulatory Visit: Payer: Self-pay | Admitting: Physician Assistant

## 2025-01-16 ENCOUNTER — Telehealth: Payer: Self-pay | Admitting: Physician Assistant

## 2025-01-16 ENCOUNTER — Ambulatory Visit: Admitting: Physician Assistant

## 2025-01-16 VITALS — BP 100/56 | HR 72 | Temp 97.7°F | Ht 62.0 in | Wt 153.6 lb

## 2025-01-16 DIAGNOSIS — J209 Acute bronchitis, unspecified: Secondary | ICD-10-CM

## 2025-01-16 DIAGNOSIS — R031 Nonspecific low blood-pressure reading: Secondary | ICD-10-CM

## 2025-01-16 DIAGNOSIS — R0600 Dyspnea, unspecified: Secondary | ICD-10-CM | POA: Diagnosis not present

## 2025-01-16 LAB — CBC WITH DIFFERENTIAL/PLATELET
Basophils Absolute: 0 K/uL (ref 0.0–0.1)
Basophils Relative: 0.6 % (ref 0.0–3.0)
Eosinophils Absolute: 0.3 K/uL (ref 0.0–0.7)
Eosinophils Relative: 4.1 % (ref 0.0–5.0)
HCT: 40.5 % (ref 36.0–46.0)
Hemoglobin: 13.4 g/dL (ref 12.0–15.0)
Lymphocytes Relative: 33.6 % (ref 12.0–46.0)
Lymphs Abs: 2.2 K/uL (ref 0.7–4.0)
MCHC: 33.1 g/dL (ref 30.0–36.0)
MCV: 94.7 fl (ref 78.0–100.0)
Monocytes Absolute: 0.8 K/uL (ref 0.1–1.0)
Monocytes Relative: 12 % (ref 3.0–12.0)
Neutro Abs: 3.2 K/uL (ref 1.4–7.7)
Neutrophils Relative %: 49.7 % (ref 43.0–77.0)
Platelets: 164 K/uL (ref 150.0–400.0)
RBC: 4.28 Mil/uL (ref 3.87–5.11)
RDW: 13.1 % (ref 11.5–15.5)
WBC: 6.4 K/uL (ref 4.0–10.5)

## 2025-01-16 LAB — COMPREHENSIVE METABOLIC PANEL WITH GFR
ALT: 20 U/L (ref 3–35)
AST: 13 U/L (ref 5–37)
Albumin: 3.8 g/dL (ref 3.5–5.2)
Alkaline Phosphatase: 33 U/L — ABNORMAL LOW (ref 39–117)
BUN: 27 mg/dL — ABNORMAL HIGH (ref 6–23)
CO2: 32 meq/L (ref 19–32)
Calcium: 8.4 mg/dL (ref 8.4–10.5)
Chloride: 103 meq/L (ref 96–112)
Creatinine, Ser: 1.08 mg/dL (ref 0.40–1.20)
GFR: 46.44 mL/min — ABNORMAL LOW
Glucose, Bld: 121 mg/dL — ABNORMAL HIGH (ref 70–99)
Potassium: 3.9 meq/L (ref 3.5–5.1)
Sodium: 140 meq/L (ref 135–145)
Total Bilirubin: 0.9 mg/dL (ref 0.2–1.2)
Total Protein: 6 g/dL (ref 6.0–8.3)

## 2025-01-16 LAB — BRAIN NATRIURETIC PEPTIDE: Pro B Natriuretic peptide (BNP): 41 pg/mL (ref 1.0–100.0)

## 2025-01-16 NOTE — Patient Instructions (Signed)
" °  VISIT SUMMARY: During your visit, we discussed your persistent cough and upper respiratory symptoms, shortness of breath, and low blood pressure. We reviewed your current medications and made adjustments to help manage your symptoms.  YOUR PLAN: ACUTE BRONCHITIS: You have a persistent cough that started before Christmas, initially dry and now productive with mucus. There is no pneumonia, and your lungs sound clear today. -Continue using your inhaler, 2 puffs every 4-6 hours as needed. -Continue taking Mucinex 12-hour for cough management. -Consider using Tessalon Perles for daytime cough if needed. -We ordered blood tests (CBC, CMP, and BNP) to check for any underlying issues.  DYSPNEA: You are experiencing shortness of breath at rest and with exertion. There is no chest pain or wheezing today, and your symptoms have improved since last week. -We ordered a BNP level to check for acute congestive heart failure. -Continue taking Lasix  160 mg daily as prescribed by your cardiologist.  HYPOTENSION: Your blood pressure is low today at 90/50 mmHg. You have a history of low blood pressure and are experiencing lightheadedness and occasional stumbling. -Monitor your blood pressure at home. -Consider holding your blood pressure medication if your blood pressure remains low and symptoms persist. -Make sure to drink enough fluids.    Contains text generated by [employer].   "

## 2025-01-16 NOTE — Telephone Encounter (Signed)
 Pt states she cancelled her appt for lab work for tomorrow, because she had blood work done at her pcp today and they will do the calcium level and sent report to Ronal Dragon.    Pt also wants to know how often is she to get lab work done?

## 2025-01-16 NOTE — Progress Notes (Signed)
 "   Patient ID: Cheryl Wood, female    DOB: [birth year], 89 y.o.   MRN: [id number]   Assessment & Plan:  Acute bronchitis, unspecified organism -     CBC with Differential/Platelet -     Comprehensive metabolic panel with GFR -     Brain natriuretic peptide  Dyspnea, unspecified type -     CBC with Differential/Platelet -     Comprehensive metabolic panel with GFR -     Brain natriuretic peptide  Low blood pressure reading     Assessment & Plan Acute bronchitis Persistent cough since before Christmas, initially dry, now productive with mucus. No fever or cold symptoms. Improvement with Z-Pak and inhaler. No pneumonia on recent x-ray per patient at [facility] urgent care (no records to review). Possible RSV infection considered, but RSV vaccine received in 2023. Lungs sound clear today. Symptoms may take 6-8 weeks to resolve. - Continue using inhaler, 2 puffs every 4-6 hours as needed. - Continue Mucinex 12-hour for cough management. - Consider Tessalon Perles for daytime cough if needed. - Ordered CBC, CMP, and BNP level to assess for underlying issues.  Dyspnea Shortness of breath at rest and with exertion. No chest pain or wheezing today. Improvement from last week. Possible RSV infection considered. No signs of fluid overload on x-ray. BNP level to be checked to rule out acute congestive heart failure. - Ordered BNP level to assess for acute congestive heart failure. - Continue Lasix  160 mg daily as prescribed by cardiologist.  Hypotension Blood pressure low today at 90/50 mmHg. History of low blood pressure during infusions. No significant dizziness, but lightheadedness and stumbling reported. Blood pressure medication (losartan ) taken at a reduced dose. No red flags on exam today. - Monitor blood pressure at home. - Consider holding blood pressure medication if blood pressure remains low and symptoms persist. - Encouraged adequate fluid intake.      F/up prn      Subjective:    Chief Complaint  Patient presents with   Cough    Pt in office for ongoing cough since URI; pt states started with dry cough week before Christmas and doing better but went to UC and got Zpack and inhaler. Pt states cough is still present; pt is feeling winded and chest is tight but not coughing up any mucus;     HPI Discussed the use of AI scribe software for clinical note transcription with the patient, who gave verbal consent to proceed.  History of Present Illness Cheryl Wood is an 89 year old female who presents with a persistent cough and upper respiratory symptoms. She is accompanied by her daughter.  She has been experiencing a persistent cough and upper respiratory symptoms since the week before Christmas. Initially, the cough was dry, which she attributed to dry air while [place]. The cough has since evolved to include occasional mucus production, requiring frequent use of tissues and paper towels. Despite treatment with prednisone , Omnicef , a Z-Pak, and an inhaler, her symptoms persist. She reports that the urgent care provider told her the x-ray showed no pneumonia but there was a bulge, which she said could indicate a hiatal hernia.  She denies fever or cold symptoms at the onset but reports feeling exhausted and experiencing sleep disturbances due to the cough. She has been more tired than usual and has had difficulty sleeping, although she has slept better in the last three nights. She experiences shortness of breath both at  rest and with exertion, describing herself as 'winded' with any activity. She reports low blood pressure readings, which she monitors at home, and notes that her blood pressure was low during her urgent care visit and today. She takes a quarter tablet of losartan  for blood pressure management.  She describes feeling lightheaded and occasionally stumbling, but does not describe this as dizziness. She takes  Lasix , 160 mg daily, which she divides into two doses but often takes all at once in the morning.  Her sister has blood cancer and her brother had multiple health issues, including heart problems.     Past Medical History:  Diagnosis Date   Allergy    Arthritis    Bleeding of blood vessel    ext. genitalia area   Blood in stool    Broken wrist, left, with routine healing, subsequent encounter 2025   pt stated declined surgery   Chronic kidney disease    Depression    Glaucoma    Heart murmur    History of chicken pox    History of recurrent UTIs    History of stomach ulcers    Hyperlipidemia    Hypertension    Memory loss    Rheumatic fever    Seizures (HCC)    Stroke (HCC)    2007; embolic   TIA (transient ischemic attack) 10/22/2013   Urine incontinence     Past Surgical History:  Procedure Laterality Date   ABDOMINAL HYSTERECTOMY     APPENDECTOMY  1983   CATARACT EXTRACTION Right May 13, 2014   EYE SURGERY  12/13/2018   stent inserted in left eye   gum transplant     RIGHT/LEFT HEART CATH AND CORONARY ANGIOGRAPHY N/A 05/12/2018   Procedure: RIGHT/LEFT HEART CATH AND CORONARY ANGIOGRAPHY;  Surgeon: Claudene Victory ORN, MD;  Location: [facility] INVASIVE CV LAB;  Service: Cardiovascular;  Laterality: N/A;   THORACIC AORTOGRAM N/A 05/12/2018   Procedure: THORACIC AORTOGRAM;  Surgeon: Claudene Victory ORN, MD;  Location[facility] INVASIVE CV LAB;  Service: Cardiovascular;  Laterality: N/A;   TONSILLECTOMY      Family History  Problem Relation Age of Onset   Bladder Cancer Mother 38   Early death Mother    Heart disease Father    Arthritis Father    Early death Father    Hearing loss Father    Hypertension Father    Hyperlipidemia Father    Thyroid  cancer Sister    Arthritis Sister    Cancer - Other Sister        blood   Alcohol  abuse Sister    COPD Sister    Drug abuse Sister    Early death Sister    Heart disease Sister    Hypertension Sister    Hyperlipidemia Sister     Arthritis Brother    Diabetes Brother    Heart attack Brother    Heart disease Brother    Hyperlipidemia Brother    Hypertension Brother    Early death Brother    Early death Brother    Hyperlipidemia Maternal Grandmother    Hypertension Maternal Grandmother    Stroke Maternal Grandmother    Alcohol  abuse Maternal Grandfather    Early death Maternal Grandfather        tuberculosis   Arthritis Paternal Grandmother    Alcohol  abuse Paternal Grandfather    Heart disease Paternal Grandfather    Hyperlipidemia Paternal Grandfather    Hypertension Paternal Grandfather    Stroke Paternal Grandfather  Diabetes Daughter    Heart disease Daughter    Hyperlipidemia Daughter    Kidney disease Niece    Diabetes Niece    Renal cancer Neg Hx    Uterine cancer Neg Hx     Social History[1]   Allergies[2]  Review of Systems NEGATIVE UNLESS OTHERWISE INDICATED IN HPI      Objective:     BP (!) 100/56 (BP Location: Right Arm, Patient Position: Sitting, Cuff Size: Normal)   Pulse 72   Temp 97.7 F (36.5 C) (Temporal)   Ht 5' 2 (1.575 m)   Wt 153 lb 9.6 oz (69.7 kg)   LMP  (LMP Unknown)   SpO2 96%   BMI 28.09 kg/m   Wt Readings from Last 3 Encounters:  01/16/25 153 lb 9.6 oz (69.7 kg)  01/03/25 150 lb 6 oz (68.2 kg)  11/16/24 156 lb (70.8 kg)    BP Readings from Last 3 Encounters:  01/16/25 (!) 100/56  01/03/25 128/76  11/16/24 136/62     Physical Exam Vitals and nursing note reviewed.  Constitutional:      General: She is not in acute distress.    Appearance: Normal appearance. She is not ill-appearing.  HENT:     Head: Normocephalic.     Right Ear: External ear normal.     Left Ear: External ear normal.     Nose: No congestion.     Mouth/Throat:     Mouth: Mucous membranes are moist.     Pharynx: No oropharyngeal exudate or posterior oropharyngeal erythema.  Eyes:     Extraocular Movements: Extraocular movements intact.     Conjunctiva/sclera:  Conjunctivae normal.     Pupils: Pupils are equal, round, and reactive to light.  Cardiovascular:     Rate and Rhythm: Normal rate and regular rhythm.     Pulses: Normal pulses.     Heart sounds: Normal heart sounds. No murmur heard. Pulmonary:     Effort: Pulmonary effort is normal. No respiratory distress.     Breath sounds: Normal breath sounds. No wheezing.  Musculoskeletal:     Cervical back: Normal range of motion.  Skin:    General: Skin is warm.  Neurological:     Mental Status: She is alert and oriented to person, place, and time.     Motor: No weakness.     Gait: Gait normal.  Psychiatric:        Mood and Affect: Mood normal.        Behavior: Behavior normal.             Elvie Maines M Daniyla Pfahler, PA-C     [1]  Social History Tobacco Use   Smoking status: Former    Current packs/day: 0.00    Types: Cigarettes    Start date: 11/04/1941    Quit date: 11/05/1971    Years since quitting: 53.2   Smokeless tobacco: Never  Vaping Use   Vaping status: Never Used  Substance Use Topics   Alcohol  use: No    Alcohol /week: 0.0 standard drinks of alcohol    Drug use: No  [2]  Allergies Allergen Reactions   Dorzolamide  Hcl-Timolol  Mal Other (See Comments)    Red eyes   Tetanus Toxoid-Containing Vaccines Swelling    Arm was twice the size it should be   Hctz [Hydrochlorothiazide] Rash   "

## 2025-01-17 ENCOUNTER — Ambulatory Visit

## 2025-01-19 NOTE — Progress Notes (Signed)
 Spoke to patient directly. Expressed understanding of lab results. No questions or concerns at this time.

## 2025-01-25 ENCOUNTER — Ambulatory Visit: Admitting: Obstetrics and Gynecology

## 2025-02-12 ENCOUNTER — Ambulatory Visit: Admitting: Physician Assistant

## 2025-02-21 ENCOUNTER — Ambulatory Visit: Admitting: Obstetrics and Gynecology

## 2025-03-07 ENCOUNTER — Ambulatory Visit: Admitting: Obstetrics and Gynecology

## 2025-04-12 ENCOUNTER — Ambulatory Visit
